# Patient Record
Sex: Male | Born: 1955
Health system: Southern US, Community
[De-identification: ages and names within clinical notes are randomized; demographics above are authoritative.]

## PROBLEM LIST (undated history)

## (undated) DIAGNOSIS — M542 Cervicalgia: Secondary | ICD-10-CM

## (undated) DIAGNOSIS — I10 Essential (primary) hypertension: Secondary | ICD-10-CM

## (undated) DIAGNOSIS — R6882 Decreased libido: Secondary | ICD-10-CM

## (undated) DIAGNOSIS — K859 Acute pancreatitis without necrosis or infection, unspecified: Secondary | ICD-10-CM

## (undated) DIAGNOSIS — F32A Depression, unspecified: Secondary | ICD-10-CM

## (undated) DIAGNOSIS — L719 Rosacea, unspecified: Secondary | ICD-10-CM

## (undated) DIAGNOSIS — E291 Testicular hypofunction: Secondary | ICD-10-CM

## (undated) DIAGNOSIS — D485 Neoplasm of uncertain behavior of skin: Secondary | ICD-10-CM

## (undated) DIAGNOSIS — K30 Functional dyspepsia: Secondary | ICD-10-CM

## (undated) DIAGNOSIS — M436 Torticollis: Secondary | ICD-10-CM

## (undated) DIAGNOSIS — G243 Spasmodic torticollis: Secondary | ICD-10-CM

## (undated) DIAGNOSIS — N5201 Erectile dysfunction due to arterial insufficiency: Secondary | ICD-10-CM

## (undated) DIAGNOSIS — R739 Hyperglycemia, unspecified: Secondary | ICD-10-CM

## (undated) DIAGNOSIS — F329 Major depressive disorder, single episode, unspecified: Secondary | ICD-10-CM

## (undated) DIAGNOSIS — Q680 Congenital deformity of sternocleidomastoid muscle: Secondary | ICD-10-CM

## (undated) HISTORY — DX: Major depressive disorder, single episode, unspecified: F32.9

## (undated) HISTORY — DX: Rosacea, unspecified: L71.9

## (undated) HISTORY — DX: Torticollis: M43.6

## (undated) HISTORY — DX: Neoplasm of uncertain behavior of skin: D48.5

## (undated) HISTORY — DX: Testicular hypofunction: E29.1

## (undated) HISTORY — DX: Congenital deformity of sternocleidomastoid muscle: Q68.0

## (undated) HISTORY — PX: OTHER SURGICAL HISTORY: SHX169

## (undated) HISTORY — DX: Essential (primary) hypertension: I10

## (undated) HISTORY — DX: Spasmodic torticollis: G24.3

## (undated) HISTORY — DX: Acute pancreatitis without necrosis or infection, unspecified: K85.90

## (undated) HISTORY — DX: Erectile dysfunction due to arterial insufficiency: N52.01

## (undated) HISTORY — DX: Depression, unspecified: F32.A

## (undated) HISTORY — DX: Functional dyspepsia: K30

## (undated) HISTORY — DX: Decreased libido: R68.82

## (undated) HISTORY — DX: Hyperglycemia, unspecified: R73.9

## (undated) HISTORY — DX: Cervicalgia: M54.2

---

## 1998-06-01 ENCOUNTER — Encounter: Payer: Self-pay | Admitting: Internal Medicine

## 1998-06-01 ENCOUNTER — Ambulatory Visit (HOSPITAL_COMMUNITY): Admission: RE | Admit: 1998-06-01 | Discharge: 1998-06-01 | Payer: Self-pay | Admitting: Internal Medicine

## 2000-05-01 ENCOUNTER — Encounter: Payer: Self-pay | Admitting: Geriatric Medicine

## 2000-05-01 ENCOUNTER — Ambulatory Visit (HOSPITAL_COMMUNITY): Admission: RE | Admit: 2000-05-01 | Discharge: 2000-05-01 | Payer: Self-pay | Admitting: Geriatric Medicine

## 2000-08-22 ENCOUNTER — Encounter: Admission: RE | Admit: 2000-08-22 | Discharge: 2000-08-22 | Payer: Self-pay | Admitting: Neurosurgery

## 2000-08-22 ENCOUNTER — Encounter: Payer: Self-pay | Admitting: Neurosurgery

## 2002-07-09 ENCOUNTER — Encounter: Admission: RE | Admit: 2002-07-09 | Discharge: 2002-08-20 | Payer: Self-pay | Admitting: Internal Medicine

## 2003-09-06 DIAGNOSIS — K859 Acute pancreatitis without necrosis or infection, unspecified: Secondary | ICD-10-CM

## 2003-09-06 HISTORY — DX: Acute pancreatitis without necrosis or infection, unspecified: K85.90

## 2004-02-21 ENCOUNTER — Inpatient Hospital Stay (HOSPITAL_COMMUNITY): Admission: EM | Admit: 2004-02-21 | Discharge: 2004-02-25 | Payer: Self-pay | Admitting: *Deleted

## 2004-11-15 ENCOUNTER — Encounter: Admission: RE | Admit: 2004-11-15 | Discharge: 2004-11-15 | Payer: Self-pay | Admitting: Internal Medicine

## 2015-04-22 ENCOUNTER — Telehealth: Payer: Self-pay | Admitting: *Deleted

## 2015-04-22 ENCOUNTER — Ambulatory Visit: Payer: BLUE CROSS/BLUE SHIELD | Admitting: Neurology

## 2015-04-22 NOTE — Telephone Encounter (Signed)
No showed now patient appointment.

## 2015-04-23 ENCOUNTER — Encounter: Payer: Self-pay | Admitting: Neurology

## 2015-12-31 DIAGNOSIS — Z1211 Encounter for screening for malignant neoplasm of colon: Secondary | ICD-10-CM | POA: Diagnosis not present

## 2015-12-31 DIAGNOSIS — D126 Benign neoplasm of colon, unspecified: Secondary | ICD-10-CM | POA: Diagnosis not present

## 2015-12-31 DIAGNOSIS — D125 Benign neoplasm of sigmoid colon: Secondary | ICD-10-CM | POA: Diagnosis not present

## 2016-02-17 DIAGNOSIS — R05 Cough: Secondary | ICD-10-CM | POA: Diagnosis not present

## 2016-02-17 DIAGNOSIS — E291 Testicular hypofunction: Secondary | ICD-10-CM | POA: Diagnosis not present

## 2016-02-17 DIAGNOSIS — F329 Major depressive disorder, single episode, unspecified: Secondary | ICD-10-CM | POA: Diagnosis not present

## 2016-02-17 DIAGNOSIS — N5201 Erectile dysfunction due to arterial insufficiency: Secondary | ICD-10-CM | POA: Diagnosis not present

## 2016-12-06 ENCOUNTER — Encounter (INDEPENDENT_AMBULATORY_CARE_PROVIDER_SITE_OTHER): Payer: Self-pay

## 2016-12-06 ENCOUNTER — Encounter: Payer: Self-pay | Admitting: Neurology

## 2016-12-06 ENCOUNTER — Ambulatory Visit (INDEPENDENT_AMBULATORY_CARE_PROVIDER_SITE_OTHER): Payer: BLUE CROSS/BLUE SHIELD | Admitting: Neurology

## 2016-12-06 VITALS — BP 144/87 | HR 93 | Ht 72.0 in | Wt 214.0 lb

## 2016-12-06 DIAGNOSIS — G243 Spasmodic torticollis: Secondary | ICD-10-CM

## 2016-12-06 NOTE — Progress Notes (Signed)
PATIENT: Miguel [name] DOB: Jan 27, 1956  Chief Complaint  Patient presents with  . Cervical Dystonia    He is here to establish care for his Xeomin treatments.  He has had both Botox and Xeomin in the past.  His last injection was 11/19/15 (Xeomin 250 units) and he felt is was beneficial.  He has been getting these treatments from Dr. Evelena Leyden at [facility].  . PCP    Josetta Huddle, MD     HISTORICAL  Miguel [name] is a 61 years old right-handed male, seen in refer by his primary care physician Dr. Josetta Huddle for evaluation of  EMG guided botulism toxin injection for his cervical dystonia, initial evaluation was on December 06 2016.  I reviewed and summarized referring note, he had a history of hypertension, major depression in the past, history of acute pancreatitis, hyperglycemia, he works as a Associate Professor.  Without clear triggers, he noticed gradual onset neck abnormal posturing 20 years ago in 1998, he described difficulty turning towards the left side, neck muscle spasm, grinding sound of his neck and moving about, he was seen by different physicians and eventually was diagnosed with cervical dystonia by Dr. Evelena Leyden at the [facility],   He began to receive EMG guided botulism toxin a Xeomin injection around 2010, I was able to review injection note by Dr. Gilford Rile, he is receiving injection every 4-6 months due to financial concerns, use Xeomin 250 to 300 units each time.  He reported significant improvement to previous injection, most recent injection was in March 2017, he could not continue the injection at the [facility] due to financial concerns.   REVIEW OF SYSTEMS: Full 14 system review of systems performed and notable only for too much sleep  ALLERGIES: Not on File  HOME MEDICATIONS: Current Outpatient Prescriptions  Medication Sig Dispense Refill  . buPROPion (WELLBUTRIN SR) 150 MG 12 hr tablet Take 150 mg by mouth daily.  2  . Cholecalciferol  (VITAMIN D) 2000 units CAPS Take 2,000 Units by mouth daily.    . citalopram (CELEXA) 40 MG tablet Take 40 mg by mouth daily.    . clonazePAM (KLONOPIN) 1 MG tablet Take 1 mg by mouth daily.  0  . esomeprazole (NEXIUM) 40 MG capsule Take 40 mg by mouth as needed.    [name] Kitchen lisinopril (PRINIVIL,ZESTRIL) 20 MG tablet Take 20 mg by mouth daily.  2  . Multiple Vitamin (MULTIVITAMIN) capsule Take 1 capsule by mouth daily.     No current facility-administered medications for this visit.     PAST MEDICAL HISTORY: Past Medical History:  Diagnosis Date  . Cervical dystonia   . Cervicalgia   . Congenital deformity of sternocleidomastoid muscle   . Decreased libido   . Depression   . Erectile dysfunction due to arterial insufficiency   . Functional dyspepsia   . Hyperglycemia   . Hypertension   . Neoplasm of uncertain behavior of skin   . Pancreatitis 2005   likely secondary to alcohol use  . Rosacea   . Testicular hypofunction   . Torticollis     PAST SURGICAL HISTORY: Past Surgical History:  Procedure Laterality Date  . no past surgery      FAMILY HISTORY: Family History  Problem Relation Age of Onset  . Uterine cancer Mother   . Emphysema Father     SOCIAL HISTORY:  Social History   Social History  . Marital status: Married    Spouse name: N/A  .  Number of children: 0  . Years of education: some college   Occupational History  . Associate Professor    Social History Main Topics  . Smoking status: Former Research scientist (life sciences)  . Smokeless tobacco: Never Used  . Alcohol use Yes     Comment: 3 beers per day  . Drug use: No  . Sexual activity: Not on file   Other Topics Concern  . Not on file   Social History Narrative   Lives at home with his wife.   3 cups of caffeine day.   Right-handed.        PHYSICAL EXAM   Vitals:   12/06/16 1101  BP: (!) 144/87  Pulse: 93  Weight: 214 lb (97.1 kg)  Height: 6' (1.829 m)    Not recorded      Body mass index is 29.02  kg/m.  PHYSICAL EXAMNIATION:  Gen: NAD, conversant, well nourised, obese, well groomed                     Cardiovascular: Regular rate rhythm, no peripheral edema, warm, nontender. Eyes: Conjunctivae clear without exudates or hemorrhage Neck: Supple, no carotid bruits. Pulmonary: Clear to auscultation bilaterally   NEUROLOGICAL EXAM:  MENTAL STATUS: Speech:    Speech is normal; fluent and spontaneous with normal comprehension.  Cognition:     Orientation to time, place and person     Normal recent and remote memory     Normal Attention span and concentration     Normal Language, naming, repeating,spontaneous speech     Fund of knowledge   CRANIAL NERVES: He has mild to moderate retrocollis, mild to moderate left tilt, mild right turn, mild right shoulder elevation, able to obtain 2 extreme left and right, but with difficulty especially when turning to the left side  CN II: Visual fields are full to confrontation. Fundoscopic exam is normal with sharp discs and no vascular changes. Pupils are round equal and briskly reactive to light. CN III, IV, VI: extraocular movement are normal. No ptosis. CN V: Facial sensation is intact to pinprick in all 3 divisions bilaterally. Corneal responses are intact.  CN VII: Face is symmetric with normal eye closure and smile. CN VIII: Hearing is normal to rubbing fingers CN IX, X: Palate elevates symmetrically. Phonation is normal. CN XI: Head turning and shoulder shrug are intact CN XII: Tongue is midline with normal movements and no atrophy.  MOTOR: There is no pronator drift of out-stretched arms. Muscle bulk and tone are normal. Muscle strength is normal.  REFLEXES: Reflexes are 2+ and symmetric at the biceps, triceps, knees, and ankles. Plantar responses are flexor.  SENSORY: Intact to light touch, pinprick, positional sensation and vibratory sensation are intact in fingers and toes.  COORDINATION: Rapid alternating movements and fine  finger movements are intact. There is no dysmetria on finger-to-nose and heel-knee-shin.    GAIT/STANCE: Posture is normal. Gait is steady with normal steps, base, arm swing, and turning. Heel and toe walking are normal. Tandem gait is normal.  Romberg is absent.   DIAGNOSTIC DATA (LABS, IMAGING, TESTING) - I reviewed patient records, labs, notes, testing and imaging myself where available.   ASSESSMENT AND PLAN  Miguel [name] is a 61 y.o. male   Cervical dystonia  EMG guided xeomin injection, asked for 300 units xeomin under EMG guidance  Return to clinic in one month for injection   Marcial Pacas, M.D. Ph.D.  [facility] [address] [place],  [place] [address] Ph: [phone] Fax: [phone]  CC: Josetta Huddle, MD

## 2016-12-14 ENCOUNTER — Telehealth: Payer: Self-pay | Admitting: Neurology

## 2016-12-14 NOTE — Telephone Encounter (Signed)
Patient is calling to see if PA has been gotten for him to have Xeomin injections.

## 2016-12-14 NOTE — Telephone Encounter (Signed)
Pt has called back again requesting an update re: if he has been approved for the injections.  He would like a call back either way

## 2016-12-16 NOTE — Telephone Encounter (Signed)
I returned the patients call, he did not answer so I left a VM asking him to call me back.

## 2016-12-20 NOTE — Telephone Encounter (Signed)
Spoke with the patient and scheduled injection. He states that he is in a lot of pain and would like to know if he can come in sooner. Botox will be complete by the end of the week.

## 2016-12-20 NOTE — Telephone Encounter (Signed)
Patient returning your call.

## 2016-12-20 NOTE — Telephone Encounter (Signed)
Left patient message - offered him an appt on 12/28/16 at 7:30am - arrival time 7:15am.  I ask him to return my call tomorrow to confirm the appt.

## 2016-12-21 NOTE — Telephone Encounter (Signed)
Pt called to accept the earlier appointment for 12-28-2016 at 7:30

## 2016-12-28 ENCOUNTER — Ambulatory Visit: Payer: Self-pay | Admitting: Neurology

## 2017-01-03 DIAGNOSIS — G243 Spasmodic torticollis: Secondary | ICD-10-CM | POA: Diagnosis not present

## 2017-01-03 NOTE — Telephone Encounter (Signed)
Pt is asking that you contact the pharmacy to have the Xeomin injections sent.

## 2017-01-04 ENCOUNTER — Ambulatory Visit: Payer: BLUE CROSS/BLUE SHIELD | Admitting: Neurology

## 2017-01-04 ENCOUNTER — Ambulatory Visit (INDEPENDENT_AMBULATORY_CARE_PROVIDER_SITE_OTHER): Payer: BLUE CROSS/BLUE SHIELD | Admitting: Neurology

## 2017-01-04 ENCOUNTER — Encounter: Payer: Self-pay | Admitting: Neurology

## 2017-01-04 VITALS — BP 165/100 | HR 68 | Ht 72.0 in | Wt 219.0 lb

## 2017-01-04 DIAGNOSIS — G243 Spasmodic torticollis: Secondary | ICD-10-CM | POA: Diagnosis not present

## 2017-01-04 NOTE — Progress Notes (Signed)
**  Xeomin 100 units x 3 units, NDC 0259-1610-01, Lot [id number], Exp 11/2017, specialty pharmacy.//mck,rn**

## 2017-01-04 NOTE — Progress Notes (Signed)
PATIENT: Miguel [name] DOB: 1956/01/24  Chief Complaint  Patient presents with  . Cervical Dystonia    Xeomin 300 units - specialty pharmacy     HISTORICAL  Miguel [name] is a 61 years old right-handed male, seen in refer by his primary care physician Dr. Josetta Huddle for evaluation of  EMG guided botulism toxin injection for his cervical dystonia, initial evaluation was on December 06 2016.  I reviewed and summarized referring note, he had a history of hypertension, major depression in the past, history of acute pancreatitis, hyperglycemia, he works as a Associate Professor.  Without clear triggers, he noticed gradual onset neck abnormal posturing 20 years ago in 1998, he described difficulty turning towards the left side, neck muscle spasm, grinding sound of his neck and moving about, he was seen by different physicians and eventually was diagnosed with cervical dystonia by Dr. Evelena Leyden at the [facility],   He began to receive EMG guided botulism toxin a Xeomin injection around 2010, I was able to review injection note by Dr. Gilford Rile, he is receiving injection every 4-6 months due to financial concerns, use Xeomin 250 to 300 units each time.  He reported significant improvement to previous injection, most recent injection was in March 2017, he could not continue the injection at the [facility] due to financial concerns.   UPDATE Jan 04 2017: This is his first EMG guided Xeomin injection for cervical dystonia through our office, he understands the potential benefit and site effect, used Xeomin 300 units at today's visit  REVIEW OF SYSTEMS: Full 14 system review of systems performed and notable only for too much sleep  ALLERGIES: Not on File  HOME MEDICATIONS: Current Outpatient Prescriptions  Medication Sig Dispense Refill  . buPROPion (WELLBUTRIN SR) 150 MG 12 hr tablet Take 150 mg by mouth daily.  2  . Cholecalciferol (VITAMIN D) 2000 units CAPS Take 2,000 Units by  mouth daily.    . citalopram (CELEXA) 40 MG tablet Take 40 mg by mouth daily.    . clonazePAM (KLONOPIN) 1 MG tablet Take 1 mg by mouth daily.  0  . esomeprazole (NEXIUM) 40 MG capsule Take 40 mg by mouth as needed.    . IncobotulinumtoxinA (XEOMIN IM) Inject 300 Units into the muscle every 3 (three) months.    Marland Kitchen lisinopril (PRINIVIL,ZESTRIL) 20 MG tablet Take 20 mg by mouth daily.  2  . Multiple Vitamin (MULTIVITAMIN) capsule Take 1 capsule by mouth daily.     No current facility-administered medications for this visit.     PAST MEDICAL HISTORY: Past Medical History:  Diagnosis Date  . Cervical dystonia   . Cervicalgia   . Congenital deformity of sternocleidomastoid muscle   . Decreased libido   . Depression   . Erectile dysfunction due to arterial insufficiency   . Functional dyspepsia   . Hyperglycemia   . Hypertension   . Neoplasm of uncertain behavior of skin   . Pancreatitis 2005   likely secondary to alcohol use  . Rosacea   . Testicular hypofunction   . Torticollis     PAST SURGICAL HISTORY: Past Surgical History:  Procedure Laterality Date  . no past surgery      FAMILY HISTORY: Family History  Problem Relation Age of Onset  . Uterine cancer Mother   . Emphysema Father     SOCIAL HISTORY:  Social History   Social History  . Marital status: Married    Spouse name: N/A  . Number  of children: 0  . Years of education: some college   Occupational History  . Associate Professor    Social History Main Topics  . Smoking status: Former Research scientist (life sciences)  . Smokeless tobacco: Never Used  . Alcohol use Yes     Comment: 3 beers per day  . Drug use: No  . Sexual activity: Not on file   Other Topics Concern  . Not on file   Social History Narrative   Lives at home with his wife.   3 cups of caffeine day.   Right-handed.        PHYSICAL EXAM   Vitals:   01/04/17 1431  BP: (!) 165/100  Pulse: 68  Weight: 219 lb (99.3 kg)  Height: 6' (1.829 m)    Not  recorded      Body mass index is 29.7 kg/m.  PHYSICAL EXAMNIATION:  Gen: NAD, conversant, well nourised, obese, well groomed                     Cardiovascular: Regular rate rhythm, no peripheral edema, warm, nontender. Eyes: Conjunctivae clear without exudates or hemorrhage Neck: Supple, no carotid bruits. Pulmonary: Clear to auscultation bilaterally   NEUROLOGICAL EXAM:  MENTAL STATUS: Speech:    Speech is normal; fluent and spontaneous with normal comprehension.  Cognition:     Orientation to time, place and person     Normal recent and remote memory     Normal Attention span and concentration     Normal Language, naming, repeating,spontaneous speech     Fund of knowledge   CRANIAL NERVES: He has mild to moderate retrocollis, mild to moderate left tilt, mild right turn, mild left shoulder elevation, able to obtain extreme left and right, but with difficulty especially when turning to the left side  CN II: Visual fields are full to confrontation. Fundoscopic exam is normal with sharp discs and no vascular changes. Pupils are round equal and briskly reactive to light. CN III, IV, VI: extraocular movement are normal. No ptosis. CN V: Facial sensation is intact to pinprick in all 3 divisions bilaterally. Corneal responses are intact.  CN VII: Face is symmetric with normal eye closure and smile. CN VIII: Hearing is normal to rubbing fingers CN IX, X: Palate elevates symmetrically. Phonation is normal. CN XI: Head turning and shoulder shrug are intact CN XII: Tongue is midline with normal movements and no atrophy.  MOTOR: There is no pronator drift of out-stretched arms. Muscle bulk and tone are normal. Muscle strength is normal.  REFLEXES: Reflexes are 2+ and symmetric at the biceps, triceps, knees, and ankles. Plantar responses are flexor.  SENSORY: Intact to light touch, pinprick, positional sensation and vibratory sensation are intact in fingers and  toes.  COORDINATION: Rapid alternating movements and fine finger movements are intact. There is no dysmetria on finger-to-nose and heel-knee-shin.    GAIT/STANCE: Posture is normal. Gait is steady with normal steps, base, arm swing, and turning. Heel and toe walking are normal. Tandem gait is normal.  Romberg is absent.   DIAGNOSTIC DATA (LABS, IMAGING, TESTING) - I reviewed patient records, labs, notes, testing and imaging myself where available.   ASSESSMENT AND PLAN  Chidiebere Wynn is a 61 y.o. male   Cervical dystonia  EMG guided xeomin injection, used to 300 units of xeomin  Left levator scapular 25 units Right levator scapular 25 units  Left longissimus capitis 25 units Left splenius capitis 25 units  Left splenius cervix 25 units  Right sternocleidomastoid 25 units times 2= 50 units  Left inferior oblique capitis 25 units Right inferior oblique capitis 25 units  Left semispinalis 25 units Right splenius capitis 25 units  Patient tolerate the injection well and will return to clinic in 3 months for repeat injection.  Marcial Pacas, M.D. Ph.D.  [facility] [place], [place] [address] Phone: [phone] Fax:      [phone] Right semispinalis 25 units  Marcial Pacas, M.D. Ph.D.  [facility] [address] [place], [place] [address] Ph: [phone] Fax: [phone]  CC: Josetta Huddle, MD

## 2017-02-24 DIAGNOSIS — M436 Torticollis: Secondary | ICD-10-CM | POA: Diagnosis not present

## 2017-02-24 DIAGNOSIS — I10 Essential (primary) hypertension: Secondary | ICD-10-CM | POA: Diagnosis not present

## 2017-02-24 DIAGNOSIS — R739 Hyperglycemia, unspecified: Secondary | ICD-10-CM | POA: Diagnosis not present

## 2017-02-24 DIAGNOSIS — N5201 Erectile dysfunction due to arterial insufficiency: Secondary | ICD-10-CM | POA: Diagnosis not present

## 2017-02-24 DIAGNOSIS — Z125 Encounter for screening for malignant neoplasm of prostate: Secondary | ICD-10-CM | POA: Diagnosis not present

## 2017-02-24 DIAGNOSIS — Z0000 Encounter for general adult medical examination without abnormal findings: Secondary | ICD-10-CM | POA: Diagnosis not present

## 2017-02-24 DIAGNOSIS — E291 Testicular hypofunction: Secondary | ICD-10-CM | POA: Diagnosis not present

## 2017-04-24 ENCOUNTER — Telehealth: Payer: Self-pay | Admitting: Neurology

## 2017-04-24 NOTE — Telephone Encounter (Signed)
Pt calling re: Botox appointment, he no longer wants to wait until Nov. He would like to have one as soon as possible.  He is asking to be called to be rescheduled please

## 2017-04-25 NOTE — Telephone Encounter (Signed)
I called the patient to schedule injection but he did not answer. Left a VM asking him to call us back.

## 2017-04-26 DIAGNOSIS — H524 Presbyopia: Secondary | ICD-10-CM | POA: Diagnosis not present

## 2017-06-01 DIAGNOSIS — G243 Spasmodic torticollis: Secondary | ICD-10-CM | POA: Diagnosis not present

## 2017-07-03 DIAGNOSIS — Z23 Encounter for immunization: Secondary | ICD-10-CM | POA: Diagnosis not present

## 2017-07-19 ENCOUNTER — Ambulatory Visit: Payer: BLUE CROSS/BLUE SHIELD | Admitting: Neurology

## 2018-03-28 DIAGNOSIS — R739 Hyperglycemia, unspecified: Secondary | ICD-10-CM | POA: Diagnosis not present

## 2018-03-28 DIAGNOSIS — Z79899 Other long term (current) drug therapy: Secondary | ICD-10-CM | POA: Diagnosis not present

## 2018-03-28 DIAGNOSIS — E785 Hyperlipidemia, unspecified: Secondary | ICD-10-CM | POA: Diagnosis not present

## 2018-03-28 DIAGNOSIS — Z0000 Encounter for general adult medical examination without abnormal findings: Secondary | ICD-10-CM | POA: Diagnosis not present

## 2018-03-28 DIAGNOSIS — R351 Nocturia: Secondary | ICD-10-CM | POA: Diagnosis not present

## 2018-03-28 DIAGNOSIS — E291 Testicular hypofunction: Secondary | ICD-10-CM | POA: Diagnosis not present

## 2018-03-28 DIAGNOSIS — N5201 Erectile dysfunction due to arterial insufficiency: Secondary | ICD-10-CM | POA: Diagnosis not present

## 2018-03-28 DIAGNOSIS — I10 Essential (primary) hypertension: Secondary | ICD-10-CM | POA: Diagnosis not present

## 2018-04-03 DIAGNOSIS — G243 Spasmodic torticollis: Secondary | ICD-10-CM | POA: Diagnosis not present

## 2018-04-12 DIAGNOSIS — G243 Spasmodic torticollis: Secondary | ICD-10-CM | POA: Diagnosis not present

## 2018-06-25 DIAGNOSIS — Z23 Encounter for immunization: Secondary | ICD-10-CM | POA: Diagnosis not present

## 2018-09-24 DIAGNOSIS — J069 Acute upper respiratory infection, unspecified: Secondary | ICD-10-CM | POA: Diagnosis not present

## 2018-09-24 DIAGNOSIS — J209 Acute bronchitis, unspecified: Secondary | ICD-10-CM | POA: Diagnosis not present

## 2018-12-10 MED FILL — LISINOPRIL 20 MG TABLET: 20 | 90 days supply | Qty: 90 | Fill #0

## 2018-12-10 MED FILL — BUPROPION HCL SR 150 MG TAB: 150 | 90 days supply | Qty: 90 | Fill #0

## 2018-12-25 DIAGNOSIS — G243 Spasmodic torticollis: Secondary | ICD-10-CM | POA: Diagnosis not present

## 2018-12-28 MED FILL — clonazePAM 1 MG TABS: 1 | 90 days supply | Qty: 90 | Fill #0

## 2019-04-17 DIAGNOSIS — I10 Essential (primary) hypertension: Secondary | ICD-10-CM | POA: Diagnosis not present

## 2019-04-17 DIAGNOSIS — Z79899 Other long term (current) drug therapy: Secondary | ICD-10-CM | POA: Diagnosis not present

## 2019-04-17 DIAGNOSIS — E291 Testicular hypofunction: Secondary | ICD-10-CM | POA: Diagnosis not present

## 2019-04-17 DIAGNOSIS — Z0000 Encounter for general adult medical examination without abnormal findings: Secondary | ICD-10-CM | POA: Diagnosis not present

## 2019-04-17 DIAGNOSIS — E559 Vitamin D deficiency, unspecified: Secondary | ICD-10-CM | POA: Diagnosis not present

## 2019-04-17 DIAGNOSIS — R739 Hyperglycemia, unspecified: Secondary | ICD-10-CM | POA: Diagnosis not present

## 2019-04-17 DIAGNOSIS — Z125 Encounter for screening for malignant neoplasm of prostate: Secondary | ICD-10-CM | POA: Diagnosis not present

## 2019-04-17 DIAGNOSIS — E785 Hyperlipidemia, unspecified: Secondary | ICD-10-CM | POA: Diagnosis not present

## 2019-04-17 DIAGNOSIS — R351 Nocturia: Secondary | ICD-10-CM | POA: Diagnosis not present

## 2019-04-17 DIAGNOSIS — M436 Torticollis: Secondary | ICD-10-CM | POA: Diagnosis not present

## 2019-06-18 DIAGNOSIS — G243 Spasmodic torticollis: Secondary | ICD-10-CM | POA: Diagnosis not present

## 2019-11-19 DIAGNOSIS — G243 Spasmodic torticollis: Secondary | ICD-10-CM | POA: Diagnosis not present

## 2020-01-20 DIAGNOSIS — F324 Major depressive disorder, single episode, in partial remission: Secondary | ICD-10-CM | POA: Diagnosis not present

## 2020-01-20 DIAGNOSIS — M436 Torticollis: Secondary | ICD-10-CM | POA: Diagnosis not present

## 2020-01-20 DIAGNOSIS — I951 Orthostatic hypotension: Secondary | ICD-10-CM | POA: Diagnosis not present

## 2020-01-20 DIAGNOSIS — I10 Essential (primary) hypertension: Secondary | ICD-10-CM | POA: Diagnosis not present

## 2020-03-02 DIAGNOSIS — I10 Essential (primary) hypertension: Secondary | ICD-10-CM | POA: Diagnosis not present

## 2020-03-02 DIAGNOSIS — F324 Major depressive disorder, single episode, in partial remission: Secondary | ICD-10-CM | POA: Diagnosis not present

## 2020-03-02 DIAGNOSIS — I951 Orthostatic hypotension: Secondary | ICD-10-CM | POA: Diagnosis not present

## 2020-03-02 DIAGNOSIS — M436 Torticollis: Secondary | ICD-10-CM | POA: Diagnosis not present

## 2020-04-23 DIAGNOSIS — Z0000 Encounter for general adult medical examination without abnormal findings: Secondary | ICD-10-CM | POA: Diagnosis not present

## 2020-04-23 DIAGNOSIS — M436 Torticollis: Secondary | ICD-10-CM | POA: Diagnosis not present

## 2020-04-23 DIAGNOSIS — R7303 Prediabetes: Secondary | ICD-10-CM | POA: Diagnosis not present

## 2020-04-23 DIAGNOSIS — Z125 Encounter for screening for malignant neoplasm of prostate: Secondary | ICD-10-CM | POA: Diagnosis not present

## 2020-04-23 DIAGNOSIS — Z79899 Other long term (current) drug therapy: Secondary | ICD-10-CM | POA: Diagnosis not present

## 2020-04-23 DIAGNOSIS — Z13220 Encounter for screening for lipoid disorders: Secondary | ICD-10-CM | POA: Diagnosis not present

## 2020-04-23 DIAGNOSIS — I10 Essential (primary) hypertension: Secondary | ICD-10-CM | POA: Diagnosis not present

## 2020-04-23 DIAGNOSIS — F324 Major depressive disorder, single episode, in partial remission: Secondary | ICD-10-CM | POA: Diagnosis not present

## 2020-07-13 ENCOUNTER — Other Ambulatory Visit (HOSPITAL_COMMUNITY): Payer: Self-pay | Admitting: Internal Medicine

## 2020-08-10 ENCOUNTER — Other Ambulatory Visit (HOSPITAL_COMMUNITY): Payer: Self-pay | Admitting: Internal Medicine

## 2020-09-22 ENCOUNTER — Other Ambulatory Visit (HOSPITAL_COMMUNITY): Payer: Self-pay | Admitting: Internal Medicine

## 2020-10-06 DIAGNOSIS — M170 Bilateral primary osteoarthritis of knee: Secondary | ICD-10-CM | POA: Diagnosis not present

## 2020-11-19 DIAGNOSIS — M25562 Pain in left knee: Secondary | ICD-10-CM | POA: Diagnosis not present

## 2020-11-19 DIAGNOSIS — M25561 Pain in right knee: Secondary | ICD-10-CM | POA: Diagnosis not present

## 2020-12-07 ENCOUNTER — Other Ambulatory Visit (HOSPITAL_COMMUNITY): Payer: Self-pay

## 2020-12-18 ENCOUNTER — Other Ambulatory Visit: Payer: Self-pay

## 2020-12-18 ENCOUNTER — Emergency Department (HOSPITAL_COMMUNITY): Payer: BC Managed Care – PPO

## 2020-12-18 ENCOUNTER — Inpatient Hospital Stay (HOSPITAL_COMMUNITY): Payer: BC Managed Care – PPO

## 2020-12-18 ENCOUNTER — Inpatient Hospital Stay (HOSPITAL_COMMUNITY)
Admission: EM | Admit: 2020-12-18 | Discharge: 2020-12-31 | DRG: 065 | Disposition: A | Discharge status: Rehabilitation Facility | Payer: BC Managed Care – PPO | Attending: Internal Medicine | Admitting: Internal Medicine

## 2020-12-18 ENCOUNTER — Encounter (HOSPITAL_COMMUNITY): Payer: Self-pay

## 2020-12-18 DIAGNOSIS — I639 Cerebral infarction, unspecified: Secondary | ICD-10-CM

## 2020-12-18 DIAGNOSIS — E119 Type 2 diabetes mellitus without complications: Secondary | ICD-10-CM | POA: Diagnosis not present

## 2020-12-18 DIAGNOSIS — Z20822 Contact with and (suspected) exposure to covid-19: Secondary | ICD-10-CM | POA: Diagnosis not present

## 2020-12-18 DIAGNOSIS — I951 Orthostatic hypotension: Secondary | ICD-10-CM | POA: Diagnosis not present

## 2020-12-18 DIAGNOSIS — Z87891 Personal history of nicotine dependence: Secondary | ICD-10-CM | POA: Diagnosis not present

## 2020-12-18 DIAGNOSIS — N401 Enlarged prostate with lower urinary tract symptoms: Secondary | ICD-10-CM | POA: Diagnosis not present

## 2020-12-18 DIAGNOSIS — E785 Hyperlipidemia, unspecified: Secondary | ICD-10-CM | POA: Diagnosis not present

## 2020-12-18 DIAGNOSIS — Z87892 Personal history of anaphylaxis: Secondary | ICD-10-CM | POA: Diagnosis not present

## 2020-12-18 DIAGNOSIS — R270 Ataxia, unspecified: Secondary | ICD-10-CM | POA: Diagnosis present

## 2020-12-18 DIAGNOSIS — I63212 Cerebral infarction due to unspecified occlusion or stenosis of left vertebral arteries: Principal | ICD-10-CM | POA: Diagnosis present

## 2020-12-18 DIAGNOSIS — R338 Other retention of urine: Secondary | ICD-10-CM | POA: Diagnosis not present

## 2020-12-18 DIAGNOSIS — R29700 NIHSS score 0: Secondary | ICD-10-CM | POA: Diagnosis not present

## 2020-12-18 DIAGNOSIS — E871 Hypo-osmolality and hyponatremia: Secondary | ICD-10-CM | POA: Diagnosis present

## 2020-12-18 DIAGNOSIS — L309 Dermatitis, unspecified: Secondary | ICD-10-CM | POA: Diagnosis not present

## 2020-12-18 DIAGNOSIS — Z7289 Other problems related to lifestyle: Secondary | ICD-10-CM

## 2020-12-18 DIAGNOSIS — I6389 Other cerebral infarction: Secondary | ICD-10-CM

## 2020-12-18 DIAGNOSIS — W1830XA Fall on same level, unspecified, initial encounter: Secondary | ICD-10-CM | POA: Diagnosis present

## 2020-12-18 DIAGNOSIS — G249 Dystonia, unspecified: Secondary | ICD-10-CM | POA: Diagnosis not present

## 2020-12-18 DIAGNOSIS — I63442 Cerebral infarction due to embolism of left cerebellar artery: Secondary | ICD-10-CM | POA: Diagnosis not present

## 2020-12-18 DIAGNOSIS — I69393 Ataxia following cerebral infarction: Secondary | ICD-10-CM | POA: Diagnosis not present

## 2020-12-18 DIAGNOSIS — S0990XA Unspecified injury of head, initial encounter: Secondary | ICD-10-CM | POA: Diagnosis not present

## 2020-12-18 DIAGNOSIS — I6523 Occlusion and stenosis of bilateral carotid arteries: Secondary | ICD-10-CM | POA: Diagnosis not present

## 2020-12-18 DIAGNOSIS — G4700 Insomnia, unspecified: Secondary | ICD-10-CM | POA: Diagnosis not present

## 2020-12-18 DIAGNOSIS — G243 Spasmodic torticollis: Secondary | ICD-10-CM | POA: Diagnosis not present

## 2020-12-18 DIAGNOSIS — K5900 Constipation, unspecified: Secondary | ICD-10-CM | POA: Diagnosis not present

## 2020-12-18 DIAGNOSIS — I6503 Occlusion and stenosis of bilateral vertebral arteries: Secondary | ICD-10-CM | POA: Diagnosis not present

## 2020-12-18 DIAGNOSIS — F1010 Alcohol abuse, uncomplicated: Secondary | ICD-10-CM | POA: Diagnosis present

## 2020-12-18 DIAGNOSIS — I10 Essential (primary) hypertension: Secondary | ICD-10-CM | POA: Diagnosis present

## 2020-12-18 DIAGNOSIS — F32A Depression, unspecified: Secondary | ICD-10-CM | POA: Diagnosis present

## 2020-12-18 DIAGNOSIS — Z789 Other specified health status: Secondary | ICD-10-CM

## 2020-12-18 DIAGNOSIS — Z79899 Other long term (current) drug therapy: Secondary | ICD-10-CM

## 2020-12-18 DIAGNOSIS — E876 Hypokalemia: Secondary | ICD-10-CM | POA: Diagnosis not present

## 2020-12-18 DIAGNOSIS — R0781 Pleurodynia: Secondary | ICD-10-CM | POA: Diagnosis not present

## 2020-12-18 DIAGNOSIS — F519 Sleep disorder not due to a substance or known physiological condition, unspecified: Secondary | ICD-10-CM | POA: Diagnosis not present

## 2020-12-18 DIAGNOSIS — Z560 Unemployment, unspecified: Secondary | ICD-10-CM | POA: Diagnosis not present

## 2020-12-18 DIAGNOSIS — Z886 Allergy status to analgesic agent status: Secondary | ICD-10-CM

## 2020-12-18 DIAGNOSIS — F419 Anxiety disorder, unspecified: Secondary | ICD-10-CM | POA: Diagnosis present

## 2020-12-18 DIAGNOSIS — F418 Other specified anxiety disorders: Secondary | ICD-10-CM | POA: Diagnosis not present

## 2020-12-18 DIAGNOSIS — R066 Hiccough: Secondary | ICD-10-CM | POA: Diagnosis not present

## 2020-12-18 DIAGNOSIS — J329 Chronic sinusitis, unspecified: Secondary | ICD-10-CM | POA: Diagnosis not present

## 2020-12-18 LAB — RAPID URINE DRUG SCREEN, HOSP PERFORMED
Amphetamines: NOT DETECTED
Barbiturates: NOT DETECTED
Benzodiazepines: NOT DETECTED
Cocaine: NOT DETECTED
Opiates: NOT DETECTED
Tetrahydrocannabinol: POSITIVE — AB

## 2020-12-18 LAB — CBC WITH DIFFERENTIAL/PLATELET
Abs Immature Granulocytes: 0.01 10*3/uL (ref 0.00–0.07)
Basophils Absolute: 0 10*3/uL (ref 0.0–0.1)
Basophils Relative: 0 %
Eosinophils Absolute: 0 10*3/uL (ref 0.0–0.5)
Eosinophils Relative: 0 %
HCT: 40.6 % (ref 39.0–52.0)
Hemoglobin: 14.2 g/dL (ref 13.0–17.0)
Immature Granulocytes: 0 %
Lymphocytes Relative: 19 %
Lymphs Abs: 0.9 10*3/uL (ref 0.7–4.0)
MCH: 34.8 pg — ABNORMAL HIGH (ref 26.0–34.0)
MCHC: 35 g/dL (ref 30.0–36.0)
MCV: 99.5 fL (ref 80.0–100.0)
Monocytes Absolute: 0.4 10*3/uL (ref 0.1–1.0)
Monocytes Relative: 10 %
Neutro Abs: 3.1 10*3/uL (ref 1.7–7.7)
Neutrophils Relative %: 71 %
Platelets: 210 10*3/uL (ref 150–400)
RBC: 4.08 MIL/uL — ABNORMAL LOW (ref 4.22–5.81)
RDW: 12 % (ref 11.5–15.5)
WBC: 4.5 10*3/uL (ref 4.0–10.5)
nRBC: 0 % (ref 0.0–0.2)

## 2020-12-18 LAB — ECHOCARDIOGRAM COMPLETE
AR max vel: 1.75 cm2
AV Area VTI: 1.61 cm2
AV Area mean vel: 1.63 cm2
AV Mean grad: 5 mmHg
AV Peak grad: 7.7 mmHg
Ao pk vel: 1.39 m/s
Area-P 1/2: 5.27 cm2
Calc EF: 46.2 %
Height: 72 in
MV M vel: 5.46 m/s
MV Peak grad: 119.2 mmHg
P 1/2 time: 324 msec
S' Lateral: 3.8 cm
Single Plane A2C EF: 48 %
Single Plane A4C EF: 45.7 %
Weight: 3152 oz

## 2020-12-18 LAB — CBC
HCT: 39.2 % (ref 39.0–52.0)
Hemoglobin: 13.6 g/dL (ref 13.0–17.0)
MCH: 34.3 pg — ABNORMAL HIGH (ref 26.0–34.0)
MCHC: 34.7 g/dL (ref 30.0–36.0)
MCV: 98.7 fL (ref 80.0–100.0)
Platelets: 198 10*3/uL (ref 150–400)
RBC: 3.97 MIL/uL — ABNORMAL LOW (ref 4.22–5.81)
RDW: 12 % (ref 11.5–15.5)
WBC: 4.5 10*3/uL (ref 4.0–10.5)
nRBC: 0 % (ref 0.0–0.2)

## 2020-12-18 LAB — URINALYSIS, ROUTINE W REFLEX MICROSCOPIC
Bilirubin Urine: NEGATIVE
Glucose, UA: NEGATIVE mg/dL
Hgb urine dipstick: NEGATIVE
Ketones, ur: 80 mg/dL — AB
Leukocytes,Ua: NEGATIVE
Nitrite: NEGATIVE
Protein, ur: NEGATIVE mg/dL
Specific Gravity, Urine: 1.017 (ref 1.005–1.030)
pH: 6 (ref 5.0–8.0)

## 2020-12-18 LAB — COMPREHENSIVE METABOLIC PANEL
ALT: 25 U/L (ref 0–44)
AST: 22 U/L (ref 15–41)
Albumin: 4.2 g/dL (ref 3.5–5.0)
Alkaline Phosphatase: 59 U/L (ref 38–126)
Anion gap: 10 (ref 5–15)
BUN: 9 mg/dL (ref 8–23)
CO2: 25 mmol/L (ref 22–32)
Calcium: 9.5 mg/dL (ref 8.9–10.3)
Chloride: 99 mmol/L (ref 98–111)
Creatinine, Ser: 0.69 mg/dL (ref 0.61–1.24)
GFR, Estimated: >60 mL/min (ref >60)
Glucose, Bld: 165 mg/dL — ABNORMAL HIGH (ref 70–99)
Potassium: 4 mmol/L (ref 3.5–5.1)
Sodium: 134 mmol/L — ABNORMAL LOW (ref 135–145)
Total Bilirubin: 0.7 mg/dL (ref 0.3–1.2)
Total Protein: 6.9 g/dL (ref 6.5–8.1)

## 2020-12-18 LAB — CREATININE, SERUM
Creatinine, Ser: 0.71 mg/dL (ref 0.61–1.24)
GFR, Estimated: >60 mL/min (ref >60)

## 2020-12-18 LAB — HIV ANTIBODY (ROUTINE TESTING W REFLEX): HIV Screen 4th Generation wRfx: NONREACTIVE

## 2020-12-18 LAB — PHOSPHORUS: Phosphorus: 2.6 mg/dL (ref 2.5–4.6)

## 2020-12-18 LAB — MAGNESIUM: Magnesium: 1.8 mg/dL (ref 1.7–2.4)

## 2020-12-18 LAB — SARS CORONAVIRUS 2 (TAT 6-24 HRS): SARS Coronavirus 2: NEGATIVE

## 2020-12-18 MED ORDER — ENOXAPARIN SODIUM 40 MG/0.4ML ~~LOC~~ SOLN
40.0000 mg | Freq: Every day | SUBCUTANEOUS | Status: DC
  Administered 2020-12-18 – 2020-12-31 (×14): 40 mg via SUBCUTANEOUS
  Filled 2020-12-18 (×15): qty 0.4

## 2020-12-18 MED ORDER — IRBESARTAN 150 MG PO TABS
75.0000 mg | ORAL_TABLET | Freq: Every day | ORAL | Status: DC
  Administered 2020-12-18 – 2020-12-29 (×12): 75 mg via ORAL
  Filled 2020-12-18 (×12): qty 1

## 2020-12-18 MED ORDER — BUPROPION HCL ER (SR) 150 MG PO TB12
150.0000 mg | ORAL_TABLET | Freq: Every day | ORAL | Status: DC
  Administered 2020-12-18 – 2020-12-31 (×13): 150 mg via ORAL
  Filled 2020-12-18 (×14): qty 1

## 2020-12-18 MED ORDER — CLOPIDOGREL BISULFATE 300 MG PO TABS
300.0000 mg | ORAL_TABLET | Freq: Once | ORAL | Status: AC
  Administered 2020-12-18: 300 mg via ORAL
  Filled 2020-12-18: qty 1

## 2020-12-18 MED ORDER — LORAZEPAM 2 MG/ML IJ SOLN
1.0000 mg | INTRAMUSCULAR | Status: DC | PRN
  Administered 2020-12-18: 1 mg via INTRAVENOUS
  Filled 2020-12-18: qty 1

## 2020-12-18 MED ORDER — ACETAMINOPHEN 325 MG PO TABS
650.0000 mg | ORAL_TABLET | Freq: Four times a day (QID) | ORAL | Status: DC | PRN
  Administered 2020-12-19 – 2020-12-27 (×11): 650 mg via ORAL
  Filled 2020-12-18 (×11): qty 2

## 2020-12-18 MED ORDER — ACETAMINOPHEN 650 MG RE SUPP: 650.0000 mg | Freq: Four times a day (QID) | RECTAL | Status: DC | PRN

## 2020-12-18 MED ORDER — LORAZEPAM 1 MG PO TABS: 1.0000 mg | ORAL_TABLET | ORAL | Status: DC | PRN

## 2020-12-18 MED ORDER — CLONAZEPAM 0.5 MG PO TABS
1.0000 mg | ORAL_TABLET | Freq: Every day | ORAL | Status: DC
  Administered 2020-12-18 – 2020-12-31 (×14): 1 mg via ORAL
  Filled 2020-12-18 (×14): qty 2

## 2020-12-18 MED ORDER — VENLAFAXINE HCL ER 75 MG PO CP24
75.0000 mg | ORAL_CAPSULE | Freq: Every day | ORAL | Status: DC
  Administered 2020-12-18 – 2020-12-31 (×13): 75 mg via ORAL
  Filled 2020-12-18 (×14): qty 1

## 2020-12-18 MED ORDER — ONDANSETRON HCL 4 MG PO TABS
4.0000 mg | ORAL_TABLET | Freq: Four times a day (QID) | ORAL | Status: DC | PRN
  Administered 2020-12-21: 4 mg via ORAL
  Filled 2020-12-18: qty 1

## 2020-12-18 MED ORDER — CLOPIDOGREL BISULFATE 75 MG PO TABS
75.0000 mg | ORAL_TABLET | Freq: Every day | ORAL | Status: DC
  Administered 2020-12-19 – 2020-12-31 (×13): 75 mg via ORAL
  Filled 2020-12-18 (×13): qty 1

## 2020-12-18 MED ORDER — ADULT MULTIVITAMIN W/MINERALS CH
1.0000 | ORAL_TABLET | Freq: Every day | ORAL | Status: DC
  Administered 2020-12-18 – 2020-12-26 (×8): 1 via ORAL
  Filled 2020-12-18 (×9): qty 1

## 2020-12-18 MED ORDER — ONDANSETRON 4 MG PO TBDP
4.0000 mg | ORAL_TABLET | Freq: Once | ORAL | Status: AC
Start: 2020-12-18 — End: 2020-12-18
  Administered 2020-12-18: 4 mg via ORAL
  Filled 2020-12-18: qty 1

## 2020-12-18 MED ORDER — STROKE: EARLY STAGES OF RECOVERY BOOK
Freq: Once | Status: AC
  Filled 2020-12-18: qty 1

## 2020-12-18 MED ORDER — ONDANSETRON HCL 4 MG/2ML IJ SOLN
4.0000 mg | Freq: Four times a day (QID) | INTRAMUSCULAR | Status: DC | PRN
  Administered 2020-12-20: 4 mg via INTRAVENOUS
  Filled 2020-12-18: qty 2

## 2020-12-18 MED ORDER — IOHEXOL 350 MG/ML SOLN
75.0000 mL | Freq: Once | INTRAVENOUS | Status: AC | PRN
  Administered 2020-12-18: 75 mL via INTRAVENOUS

## 2020-12-18 MED ORDER — ATORVASTATIN CALCIUM 80 MG PO TABS
80.0000 mg | ORAL_TABLET | Freq: Every day | ORAL | Status: DC
  Administered 2020-12-18 – 2020-12-31 (×13): 80 mg via ORAL
  Filled 2020-12-18 (×11): qty 1
  Filled 2020-12-18: qty 2
  Filled 2020-12-18 (×2): qty 1

## 2020-12-18 NOTE — Plan of Care (Signed)
  Problem: Education: Goal: Knowledge of disease or condition will improve Outcome: Progressing Goal: Knowledge of secondary prevention will improve Outcome: Progressing Goal: Knowledge of patient specific risk factors addressed and post discharge goals established will improve Outcome: Progressing   

## 2020-12-18 NOTE — H&P (Addendum)
History and Physical    Miguel [name] QHU:[id number] DOB: 09/22/1955 DOA: 12/18/2020  PCP: Josetta Huddle, MD  Patient coming from: home  Chief Complaint: fall, dizziness  HPI: Miguel [name] is a 65 y.o. male with medical history significant of hypertension, depression, dyspepsia, and anxiety.  3 days ago the patient was riding his lawnmower and hit a tree branch.  He hit his head.  He did not lose consciousness. He initially had some bleeding under his L eye and dizziness that seemed to be present prior to the injury.  After going to bed, he noticed that he will veer to the L when he walks. He is having some dizziness associated with it. He is not having any speech troubles, difficulty with swallowing, vision changes, or weakness that he can tell. He has been compliant with his medications. He has never had a stroke before.  He is a former smoker.  He reports having anaphylaxis when taking aspirin.  ED Course: Patient was riding his more 3 days ago and hit a tree hitting his head.  He was having dizziness, nausea, vomiting, repetitively falling, and a headache.  Plain films of the ribs were unremarkable.  Imaging of the head showed left inferior and posterior's infarct of the cerebellum in addition to a small infarct of the right thalamus.  Left distal vertebral artery occlusion was also noted on CTA of the neck.  Glucose noted to be 165, no history of diabetes. Sodium 134, though corrected it is 136.  The neurology team was consulted and they recommended admission.  Review of Systems: As per HPI otherwise 10 point review of systems negative.   Past Medical History:  Diagnosis Date  . Cervical dystonia   . Depression   . Erectile dysfunction due to arterial insufficiency   . Functional dyspepsia   . Hypertension   . Pancreatitis 2005   likely secondary to alcohol use  . Rosacea     Past Surgical History:  Procedure Laterality Date  . no past surgery       reports that he has quit  smoking. He has never used smokeless tobacco. He reports current alcohol use. He reports that he does not use drugs.  Allergies  Allergen Reactions  . Aspirin Anaphylaxis    Family History  Problem Relation Age of Onset  . Uterine cancer Mother   . Emphysema Father     Prior to Admission medications   Medication Sig Start Date End Date Taking? Authorizing Provider  acetaminophen (TYLENOL) 500 MG tablet Take 500 mg by mouth every 6 (six) hours as needed for moderate pain or headache.   Yes [provider]  buPROPion (WELLBUTRIN SR) 150 MG 12 hr tablet TAKE 1 TABLET BY MOUTH ONCE DAILY 90 Patient taking differently: Take 150 mg by mouth daily. 07/13/20 07/13/21 Yes Josetta Huddle, MD  Cholecalciferol (VITAMIN D) 2000 units CAPS Take 2,000 Units by mouth daily.   Yes [provider]  clonazePAM (KLONOPIN) 1 MG tablet TAKE 1 TABLET BY MOUTH ONCE A DAY Patient taking differently: Take 1 mg by mouth daily. 09/22/20 03/21/21 Yes Josetta Huddle, MD  esomeprazole (NEXIUM) 40 MG capsule Take 40 mg by mouth as needed (heartburn).   Yes [provider]  Multiple Vitamin (MULTIVITAMIN) capsule Take 1 capsule by mouth daily.   Yes [provider]  valsartan (DIOVAN) 80 MG tablet TAKE 1 TABLET BY MOUTH ONCE A DAY Patient taking differently: Take 80 mg by mouth daily. 08/10/20 08/10/21 Yes Josetta Huddle,  MD  venlafaxine XR (EFFEXOR-XR) 75 MG 24 hr capsule TAKE 1 CAPSULE BY MOUTH ONCE A DAY Patient taking differently: Take 75 mg by mouth daily. 08/10/20 08/10/21 Yes Josetta Huddle, MD  IncobotulinumtoxinA (XEOMIN IM) Inject 300 Units into the muscle every 3 (three) months. Patient not taking: No sig reported    [provider]    Physical Exam: Vitals:   12/18/20 0732 12/18/20 0904 12/18/20 0930 12/18/20 1051  BP: (!) 196/97 (!) 180/100 (!) 172/92 (!) 180/96  Pulse: (!) 58 60 (!) 57 65  Resp: 16 17 12 13   Temp: 98.1 F (36.7 C)     TempSrc: Oral     SpO2: 98%  97% 96% 95%  Weight:      Height:        Constitutional: NAD, calm, comfortable Eyes: PERRL, lids and conjunctivae normal ENMT: Mucous membranes are moist. Posterior pharynx clear of any exudate or lesions.Normal dentition.  Neck: normal, supple, no masses, no thyromegaly Respiratory: clear to auscultation bilaterally, no wheezing, no crackles. Normal respiratory effort. No accessory muscle use.  Cardiovascular: RRR. No extremity edema. 2+ pedal pulses. Abdomen: no tenderness, no masses palpated. No hepatosplenomegaly. Bowel sounds positive.  Musculoskeletal: no clubbing / cyanosis. No joint deformity upper and lower extremities.  Skin: no rashes, lesions, ulcers.  Neurologic: CN 2-12 grossly intact. Sensation intact, DTR normal.  Grip strength adequate, no pronator drift, fluent speech.  5/5 strength bilaterally in lower extremities. Psychiatric: Normal judgment and insight. Alert and oriented x 3. Normal mood.   Labs on Admission: I have personally reviewed following labs and imaging studies  CBC: Recent Labs  Lab 12/18/20 0852  WBC 4.5  NEUTROABS 3.1  HGB 14.2  HCT 40.6  MCV 99.5  PLT 937   Basic Metabolic Panel: Recent Labs  Lab 12/18/20 0559  NA 134*  K 4.0  CL 99  CO2 25  GLUCOSE 165*  BUN 9  CREATININE 0.69  CALCIUM 9.5   GFR: Estimated Creatinine Clearance: 102.4 mL/min (by C-G formula based on SCr of 0.69 mg/dL).   Liver Function Tests: Recent Labs  Lab 12/18/20 0559  AST 22  ALT 25  ALKPHOS 59  BILITOT 0.7  PROT 6.9  ALBUMIN 4.2   Urine analysis:    Component Value Date/Time   COLORURINE YELLOW 12/18/2020 [place] 12/18/2020 0643   LABSPEC 1.017 12/18/2020 [place] 6.0 12/18/2020 [place] 12/18/2020 0643   HGBUR NEGATIVE 12/18/2020 0643   BILIRUBINUR NEGATIVE 12/18/2020 0643   KETONESUR 80 (A) 12/18/2020 0643   PROTEINUR NEGATIVE 12/18/2020 0643   NITRITE NEGATIVE 12/18/2020 0643   LEUKOCYTESUR  NEGATIVE 12/18/2020 0643   Radiological Exams on Admission: CT ANGIO HEAD NECK W WO CM  Result Date: 12/18/2020 CLINICAL DATA:  Stroke EXAM: CT ANGIOGRAPHY HEAD AND NECK TECHNIQUE: Multidetector CT imaging of the head and neck was performed using the standard protocol during bolus administration of intravenous contrast. Multiplanar CT image reconstructions and MIPs were obtained to evaluate the vascular anatomy. Carotid stenosis measurements (when applicable) are obtained utilizing NASCET criteria, using the distal internal carotid diameter as the denominator. CONTRAST:  49mL OMNIPAQUE IOHEXOL 350 MG/ML SOLN COMPARISON:  MRI head 12/18/2020.  CT head 12/18/2020 FINDINGS: CTA NECK FINDINGS Aortic arch: Standard branching. Imaged portion shows no evidence of aneurysm or dissection. No significant stenosis of the major arch vessel origins. Right carotid system: Atherosclerotic plaque right carotid bifurcation with calcified and noncalcified plaque. 50% diameter stenosis proximal  right internal carotid artery. Left carotid system: Atherosclerotic disease left carotid bifurcation which is predominately calcified. No significant stenosis. Vertebral arteries: Right vertebral dominant and patent to the basilar without stenosis. Moderate to severe stenosis at the origin of the left vertebral artery. Small left vertebral artery with fading opacification distally and occlusion at the C1 level. Skeleton: Cervical spondylosis.  No acute skeletal abnormality. Other neck: Negative for mass or adenopathy. Upper chest: Lung apices clear bilaterally. Review of the MIP images confirms the above findings CTA HEAD FINDINGS Anterior circulation: Cavernous carotid widely patent bilaterally. Anterior and middle cerebral arteries patent bilaterally without stenosis or large vessel occlusion. Posterior circulation: Right vertebral artery widely patent and supplies the basilar. Right PICA patent. Distal left vertebral artery occluded.  Possible faint retrograde flow in the left V4 segment. Left PICA not visualized. Basilar widely patent. AICA patent bilaterally. Superior cerebellar and posterior cerebral arteries patent bilaterally. Venous sinuses: Normal venous enhancement. Anatomic variants: None Review of the MIP images confirms the above findings IMPRESSION: 1. Moderate to severe stenosis origin of left vertebral artery. Occlusion distal left vertebral artery at C1 which appears acute. Left PICA not visualized. Patient has acute left PICA infarct on MRI. 2. Atherosclerotic disease in the carotid bifurcation bilaterally. 50% diameter stenosis proximal right internal carotid artery. No significant left carotid stenosis. Electronically Signed   By: Franchot Gallo M.D.   On: 12/18/2020 10:29   DG Ribs Unilateral W/Chest Right  Result Date: 12/18/2020 CLINICAL DATA:  Fall, right rib pain EXAM: RIGHT RIBS AND CHEST - 3+ VIEW COMPARISON:  None. FINDINGS: No fracture or other bone lesions are seen involving the ribs. There is no evidence of pneumothorax or pleural effusion. Both lungs are clear. Heart size and mediastinal contours are within normal limits. IMPRESSION: Negative. Electronically Signed   By: Fidela Salisbury MD   On: 12/18/2020 07:13   CT Head Wo Contrast  Result Date: 12/18/2020 CLINICAL DATA:  65 year old male struck by tree limb while working in yard. Subsequent nausea and vomiting. EXAM: CT HEAD WITHOUT CONTRAST TECHNIQUE: Contiguous axial images were obtained from the base of the skull through the vertex without intravenous contrast. COMPARISON:  Cervical spine MRI 11/15/2004. FINDINGS: Brain: No midline shift, ventriculomegaly, mass effect, evidence of mass lesion, intracranial hemorrhage or evidence of cortically based acute infarction. Subtle asymmetric hypodensity in the central left cerebellum best seen on series 3, image 9. Elsewhere gray-white matter differentiation is within normal limits. No cerebral or cerebellar  cortical encephalomalacia identified. Right side posterosuperior frontal gyrus congenital sulcal variation suspected (series 3, image 31). Vascular: Mild Calcified atherosclerosis at the skull base. No suspicious intracranial vascular hyperdensity. Skull: No fracture identified. Sinuses/Orbits: Small low-density fluid level plus circumferential mucosal thickening in the left maxillary sinus appears inflammatory. Trace sinus mucosal thickening elsewhere. Tympanic cavities are clear. Bilateral mastoids are well aerated. Other: Leftward gaze. Otherwise negative orbits soft tissues. No discrete scalp soft tissue injury identified. IMPRESSION: 1.  No acute traumatic injury identified. 2. Largely normal for age noncontrast CT appearance of the brain. But subtle asymmetric hypodensity in the central left cerebellum might be the sequelae of small vessel ischemia. 3. Mild paranasal sinus inflammation. Electronically Signed   By: Genevie Ann M.D.   On: 12/18/2020 04:22   CT Cervical Spine Wo Contrast  Result Date: 12/18/2020 CLINICAL DATA:  65 year old male struck by tree limb while working in yard. Subsequent nausea and vomiting. EXAM: CT CERVICAL SPINE WITHOUT CONTRAST TECHNIQUE: Multidetector CT imaging of the cervical  spine was performed without intravenous contrast. Multiplanar CT image reconstructions were also generated. COMPARISON:  Cervical spine MRI 11/15/2004 and earlier. Head CT today reported separately. FINDINGS: Alignment: Stable cervical lordosis since 2006. Cervicothoracic junction alignment is within normal limits. Bilateral posterior element alignment is within normal limits. Skull base and vertebrae: Visualized skull base is intact. No atlanto-occipital dissociation. Normal C1-C2 alignment. No acute osseous abnormality identified. Soft tissues and spinal canal: No prevertebral fluid or swelling. No visible canal hematoma. Negative noncontrast neck soft tissues aside from carotid calcified atherosclerosis.  Disc levels: Degenerative subchondral cyst of the odontoid process suspected. Chronic ankylosis C2 on C3 is new since 2006. Advanced cervical spine disc and endplate degeneration elsewhere, with multilevel mild lower cervical spinal stenosis suspected. Upper chest: Visible upper thoracic levels and lung apices appear negative. Other: None. IMPRESSION: 1. No acute traumatic injury identified in the cervical spine. 2. Ankylosis of C2-C3 since 2006. Advanced cervical disc and endplate degeneration elsewhere, with subsequent multilevel mild spinal stenosis suspected. Electronically Signed   By: Genevie Ann M.D.   On: 12/18/2020 04:26   MR BRAIN WO CONTRAST  Addendum Date: 12/18/2020   ADDENDUM REPORT: 12/18/2020 08:38 ADDENDUM: Note is made of marked atrophy of the posterior paraspinous muscles in the upper neck, right greater than left. This was not present on the prior cervical MRI of 11/15/2004. Electronically Signed   By: Franchot Gallo M.D.   On: 12/18/2020 08:38   Result Date: 12/18/2020 CLINICAL DATA:  Dizziness. EXAM: MRI HEAD WITHOUT CONTRAST TECHNIQUE: Multiplanar, multiecho pulse sequences of the brain and surrounding structures were obtained without intravenous contrast. COMPARISON:  CT head 12/18/2020 FINDINGS: Brain: Scattered small areas of acute infarct in the left posterior and inferior cerebellum corresponding to CT hypodensity. Additional small infarct in the right thalamus. Generalized atrophy. Mild white matter changes consistent with chronic microvascular ischemia. Negative for hemorrhage or mass. Vascular: Abnormal flow related signal distal left vertebral artery which may be stenotic or occluded. Otherwise normal arterial flow voids at the base of brain. Skull and upper cervical spine: No focal abnormality. Sinuses/Orbits: Mucosal edema paranasal sinuses.  Negative orbit Other: None IMPRESSION: Acute infarct left inferior and posterior cerebellum. Small acute infarct right thalamus.  Electronically Signed: By: Franchot Gallo M.D. On: 12/18/2020 08:25    EKG: Independently reviewed.   Assessment/Plan Principal Problem:   Acute CVA (cerebrovascular accident) [facility])  [place] neurology  Start Plavix 75 mg daily  Unfortunately he has anaphylaxis to aspirin  Start Lipitor 80 mg daily  Check A1c and lipid panel  Consult PT/OT  General diet as he passed his bedside swallow  Zofran for nausea  Stenosis noted on CTA of the distal left vertebral artery  Cardiac monitoring  Active Problems:   Depression with anxiety  Continue Wellbutrin 150 mg daily  Continue Effexor XR 75 mg daily  Continue Klonopin 1 mg daily    Essential hypertension  Restart valsartan 80 mg daily  DVT prophylaxis: Lovenox Code Status: Full  Family Communication: Self, wife at bedside Disposition Plan: Home Consults called: Neurology  Admission status: Inpatient   Severity of Illness: The appropriate patient status for this patient is INPATIENT. Inpatient status is judged to be reasonable and necessary in order to provide the required intensity of service to ensure the patient's safety. The patient's presenting symptoms, physical exam findings, and initial radiographic and laboratory data in the context of their chronic comorbidities is felt to place them at high risk for further clinical deterioration. Furthermore, it  is not anticipated that the patient will be medically stable for discharge from the hospital within 2 midnights of admission. The following factors support the patient status of inpatient.   " The patient's presenting symptoms include fall. " The worrisome physical exam findings include leftward gait deviation. " The initial radiographic and laboratory data are worrisome because of evidence of stroke on MRI and CT. " The chronic co-morbidities include hypertension, depression and anxiety.   * I certify that at the point of admission it is my clinical judgment that the patient  will require inpatient hospital care spanning beyond 2 midnights from the point of admission due to high intensity of service, high risk for further deterioration and high frequency of surveillance required.*   Shelda Pal, DO [facility] [email or web address] 12/18/2020, 10:58 AM

## 2020-12-18 NOTE — ED Notes (Signed)
Signature pad not working, patient and wife verbalize understanding of MSE.

## 2020-12-18 NOTE — ED Triage Notes (Signed)
Patient reports he was cutting grass and a tree limb hit him in the face, patient with nausea and vomiting since injury, also reports gait abnormality, wife reports multiple falls at home, denies blood thinners.

## 2020-12-18 NOTE — Evaluation (Signed)
Physical Therapy Evaluation Patient Details Name: Miguel [name] MRN: [id number] DOB: November 10, 1955 Today's Date: 12/18/2020   History of Present Illness  65 yo male with onset of new L cerebellar and R thalamic infarcts was brought to ED, noted torticollis with no ins to get injection.  PMHx:  EtOH, pancreatitis, multi level spinal stenosis C-spine, ankylosed C2-C3, cervical disc endplate degeneration, HTN, atherosclerosis, torticollis  Clinical Impression  Pt was seen for evaluation of coordination of gait and transfers, and noted his struggle to stand or walk safely. Pt also lacks protective extension on L side with his LOB.  Pt is interested in rehab, and hopefully can qualify to increase safety and independence.  Wife in attendance, and will be able to support his efforts with rehab.  However, she is working full time and will need to determine how he can be managed safely if rehab is not a possibility.  Follow as ordered for goals of acute PT.      Follow Up Recommendations CIR    Equipment Recommendations  None recommended by PT    Recommendations for Other Services Rehab consult     Precautions / Restrictions Precautions Precautions: Fall Precaution Comments: light headed with elevated BP in standing Restrictions Weight Bearing Restrictions: No Other Position/Activity Restrictions: falls to L side in standing      Mobility  Bed Mobility Overal bed mobility: Needs Assistance Bed Mobility: Supine to Sit;Sit to Supine     Supine to sit: Min assist Sit to supine: Min assist   General bed mobility comments: min assist to support trunk OOB and min assist to support legs back to bed    Transfers Overall transfer level: Needs assistance Equipment used: Rolling walker (2 wheeled);2 person hand held assist Transfers: Sit to/from Stand Sit to Stand: Min assist;Mod assist         General transfer comment: min to start but mod to capture balance in  standing  Ambulation/Gait Ambulation/Gait assistance: Min assist;Mod assist Gait Distance (Feet): 8 Feet Assistive device: Rolling walker (2 wheeled);2 person hand held assist Gait Pattern/deviations: Step-to pattern;Decreased stride length;Wide base of support Gait velocity: reduced   General Gait Details: sidestepping on side of bed as pt is too unstable to L side  Stairs            Wheelchair Mobility    Modified Rankin (Stroke Patients Only) Modified Rankin (Stroke Patients Only) Pre-Morbid Rankin Score: No symptoms Modified Rankin: Moderately severe disability     Balance Overall balance assessment: Needs assistance Sitting-balance support: Feet supported Sitting balance-Leahy Scale: Fair     Standing balance support: Bilateral upper extremity supported;During functional activity Standing balance-Leahy Scale: Poor Standing balance comment: strong list to L side                             Pertinent Vitals/Pain Pain Assessment: No/denies pain    Home Living Family/patient expects to be discharged to:: Inpatient rehab                      Prior Function Level of Independence: Independent         Comments: pt was working until 3 weeks ago     Hand Dominance   Dominant Hand: Right    Extremity/Trunk Assessment   Upper Extremity Assessment Upper Extremity Assessment: Overall WFL for tasks assessed    Lower Extremity Assessment Lower Extremity Assessment: RLE deficits/detail;LLE deficits/detail RLE Deficits / Details: strength  WFL but mobility is slow LLE Deficits / Details: strength is WFL but mobilty moderately incoordinated LLE Coordination: decreased gross motor    Cervical / Trunk Assessment Cervical / Trunk Assessment: Normal  Communication   Communication: No difficulties  Cognition Arousal/Alertness: Awake/alert Behavior During Therapy: Impulsive Overall Cognitive Status: Impaired/Different from baseline Area of  Impairment: Safety/judgement;Awareness;Problem solving;Following commands                       Following Commands: Follows one step commands inconsistently Safety/Judgement: Decreased awareness of safety;Decreased awareness of deficits Awareness: Intellectual Problem Solving: Slow processing;Requires verbal cues;Requires tactile cues General Comments: pt is expressing issue with not being up moving more although he is light headed and not feeling well once standing      General Comments General comments (skin integrity, edema, etc.): pt is up to side of bed with help and to stand mult times with unsafe presentation    Exercises     Assessment/Plan    PT Assessment Patient needs continued PT services  PT Problem List Decreased strength;Decreased balance;Decreased mobility;Decreased coordination;Decreased safety awareness;Cardiopulmonary status limiting activity       PT Treatment Interventions DME instruction;Gait training;Stair training;Functional mobility training;Therapeutic activities;Therapeutic exercise;Balance training;Neuromuscular re-education;Patient/family education    PT Goals (Current goals can be found in the Care Plan section)  Acute Rehab PT Goals Patient Stated Goal: to get stronger and be able to walk PT Goal Formulation: With patient/family Time For Goal Achievement: 01/01/21 Potential to Achieve Goals: Good    Frequency Min 4X/week   Barriers to discharge Inaccessible home environment;Decreased caregiver support home alone if wife is working and has stairs to enter house    Co-evaluation               AM-PAC PT "6 Clicks" Mobility  Outcome Measure Help needed turning from your back to your side while in a flat bed without using bedrails?: A Little Help needed moving from lying on your back to sitting on the side of a flat bed without using bedrails?: A Little Help needed moving to and from a bed to a chair (including a wheelchair)?: A  Lot Help needed standing up from a chair using your arms (e.g., wheelchair or bedside chair)?: A Lot Help needed to walk in hospital room?: A Lot Help needed climbing 3-5 steps with a railing? : Total 6 Click Score: 13    End of Session Equipment Utilized During Treatment: Gait belt Activity Tolerance: Patient limited by fatigue;Treatment limited secondary to medical complications (Comment) Patient left: in bed;with call bell/phone within reach;with family/visitor present Nurse Communication: Mobility status PT Visit Diagnosis: Unsteadiness on feet (R26.81);Ataxic gait (R26.0);Difficulty in walking, not elsewhere classified (R26.2)    Time: 8185-6314 PT Time Calculation (min) (ACUTE ONLY): 34 min   Charges:   PT Evaluation $PT Eval Moderate Complexity: 1 Mod PT Treatments $Gait Training: 8-22 mins       Ramond Dial 12/18/2020, 6:52 PM Mee Hives, PT MS Acute Rehab Dept. Number: [place]

## 2020-12-18 NOTE — Progress Notes (Addendum)
Carotid duplex bilateral study completed.   Please see CV Proc for preliminary results.   Rashauna Tep, RDMS, RVT  

## 2020-12-18 NOTE — Progress Notes (Signed)
2D echocardiogram completed.  12/18/2020 3:41 PM Kelby Aline., MHA, RVT, RDCS, RDMS

## 2020-12-18 NOTE — Progress Notes (Signed)
2D echocardiogram completed.  12/18/2020 4:01 PM Kelby Aline., MHA, RVT, RDCS, RDMS

## 2020-12-18 NOTE — ED Notes (Signed)
Patient transported to CT 

## 2020-12-18 NOTE — ED Notes (Signed)
Pt returned from MRI °

## 2020-12-18 NOTE — ED Notes (Signed)
Attempted iv x 2 unsuccessful. Labs were drawn however

## 2020-12-18 NOTE — ED Provider Notes (Signed)
Physical Exam  BP (!) 172/92   Pulse (!) 57   Temp 98.1 F (36.7 C) (Oral)   Resp 12   Ht 6' (1.829 m)   Wt 89.4 kg   SpO2 96%   BMI 26.72 kg/m   Physical Exam Vitals and nursing note reviewed.  Constitutional:      Appearance: Normal appearance.  Pulmonary:     Effort: No respiratory distress.  Abdominal:     General: There is no distension.  Skin:    General: Skin is warm and dry.  Neurological:     Mental Status: He is alert and oriented to person, place, and time.     ED Course/Procedures     Procedures  MDM  Pt signed out to me by S Upstill, PA-C. Please see previous notes for further history.   In brief, pt presenting for evaluation of falls, dizziness, n/v. Pt has had dizziness for the past few months, ever since having a minor head injury. A few days ago pt had another minor head injury after accidentally hitting a tree with his riding mower. He has a h/o HTN, but been unable to take his meds due to n/v. He describes it as feeling room is spinning. Work up has abnormal head CT. Exam shows pt leaning towards the L. MRI and labs pending.   MRI consistent with acute stroke.  Will consult neurology.  Discussed with Dr. Leonel Ramsay from neurology who will evaluate the patient.  Requesting medicine admit.  Requests aspirin given, however per chart review, patient with anaphylaxis to aspirin.  I confirmed this with the patient.  Discussed with Dr. Nani Ravens from [facility], pt to be admitted.    Results for orders placed or performed during the hospital encounter of 12/18/20  Comprehensive metabolic panel  Result Value Ref Range   Sodium 134 (L) 135 - 145 mmol/L   Potassium 4.0 3.5 - 5.1 mmol/L   Chloride 99 98 - 111 mmol/L   CO2 25 22 - 32 mmol/L   Glucose, Bld 165 (H) 70 - 99 mg/dL   BUN 9 8 - 23 mg/dL   Creatinine, Ser 0.69 0.61 - 1.24 mg/dL   Calcium 9.5 8.9 - 10.3 mg/dL   Total Protein 6.9 6.5 - 8.1 g/dL   Albumin 4.2 3.5 - 5.0 g/dL   AST  22 15 - 41 U/L   ALT 25 0 - 44 U/L   Alkaline Phosphatase 59 38 - 126 U/L   Total Bilirubin 0.7 0.3 - 1.2 mg/dL   GFR, Estimated >60 >60 mL/min   Anion gap 10 5 - 15  Urinalysis, Routine w reflex microscopic Urine, Clean Catch  Result Value Ref Range   Color, Urine YELLOW YELLOW   APPearance CLEAR CLEAR   Specific Gravity, Urine 1.017 1.005 - 1.030   pH 6.0 5.0 - 8.0   Glucose, UA NEGATIVE NEGATIVE mg/dL   Hgb urine dipstick NEGATIVE NEGATIVE   Bilirubin Urine NEGATIVE NEGATIVE   Ketones, ur 80 (A) NEGATIVE mg/dL   Protein, ur NEGATIVE NEGATIVE mg/dL   Nitrite NEGATIVE NEGATIVE   Leukocytes,Ua NEGATIVE NEGATIVE  CBC with Differential/Platelet  Result Value Ref Range   WBC 4.5 4.0 - 10.5 K/uL   RBC 4.08 (L) 4.22 - 5.81 MIL/uL   Hemoglobin 14.2 13.0 - 17.0 g/dL   HCT 40.6 39.0 - 52.0 %   MCV 99.5 80.0 - 100.0 fL   MCH 34.8 (H) 26.0 - 34.0 pg   MCHC 35.0 30.0 - 36.0  g/dL   RDW 12.0 11.5 - 15.5 %   Platelets 210 150 - 400 K/uL   nRBC 0.0 0.0 - 0.2 %   Neutrophils Relative % 71 %   Neutro Abs 3.1 1.7 - 7.7 K/uL   Lymphocytes Relative 19 %   Lymphs Abs 0.9 0.7 - 4.0 K/uL   Monocytes Relative 10 %   Monocytes Absolute 0.4 0.1 - 1.0 K/uL   Eosinophils Relative 0 %   Eosinophils Absolute 0.0 0.0 - 0.5 K/uL   Basophils Relative 0 %   Basophils Absolute 0.0 0.0 - 0.1 K/uL   Immature Granulocytes 0 %   Abs Immature Granulocytes 0.01 0.00 - 0.07 K/uL   DG Ribs Unilateral W/Chest Right  Result Date: 12/18/2020 CLINICAL DATA:  Fall, right rib pain EXAM: RIGHT RIBS AND CHEST - 3+ VIEW COMPARISON:  None. FINDINGS: No fracture or other bone lesions are seen involving the ribs. There is no evidence of pneumothorax or pleural effusion. Both lungs are clear. Heart size and mediastinal contours are within normal limits. IMPRESSION: Negative. Electronically Signed   By: Fidela Salisbury MD   On: 12/18/2020 07:13   CT Head Wo Contrast  Result Date: 12/18/2020 CLINICAL DATA:  65 year old male  struck by tree limb while working in yard. Subsequent nausea and vomiting. EXAM: CT HEAD WITHOUT CONTRAST TECHNIQUE: Contiguous axial images were obtained from the base of the skull through the vertex without intravenous contrast. COMPARISON:  Cervical spine MRI 11/15/2004. FINDINGS: Brain: No midline shift, ventriculomegaly, mass effect, evidence of mass lesion, intracranial hemorrhage or evidence of cortically based acute infarction. Subtle asymmetric hypodensity in the central left cerebellum best seen on series 3, image 9. Elsewhere gray-white matter differentiation is within normal limits. No cerebral or cerebellar cortical encephalomalacia identified. Right side posterosuperior frontal gyrus congenital sulcal variation suspected (series 3, image 31). Vascular: Mild Calcified atherosclerosis at the skull base. No suspicious intracranial vascular hyperdensity. Skull: No fracture identified. Sinuses/Orbits: Small low-density fluid level plus circumferential mucosal thickening in the left maxillary sinus appears inflammatory. Trace sinus mucosal thickening elsewhere. Tympanic cavities are clear. Bilateral mastoids are well aerated. Other: Leftward gaze. Otherwise negative orbits soft tissues. No discrete scalp soft tissue injury identified. IMPRESSION: 1.  No acute traumatic injury identified. 2. Largely normal for age noncontrast CT appearance of the brain. But subtle asymmetric hypodensity in the central left cerebellum might be the sequelae of small vessel ischemia. 3. Mild paranasal sinus inflammation. Electronically Signed   By: Genevie Ann M.D.   On: 12/18/2020 04:22   CT Cervical Spine Wo Contrast  Result Date: 12/18/2020 CLINICAL DATA:  65 year old male struck by tree limb while working in yard. Subsequent nausea and vomiting. EXAM: CT CERVICAL SPINE WITHOUT CONTRAST TECHNIQUE: Multidetector CT imaging of the cervical spine was performed without intravenous contrast. Multiplanar CT image reconstructions  were also generated. COMPARISON:  Cervical spine MRI 11/15/2004 and earlier. Head CT today reported separately. FINDINGS: Alignment: Stable cervical lordosis since 2006. Cervicothoracic junction alignment is within normal limits. Bilateral posterior element alignment is within normal limits. Skull base and vertebrae: Visualized skull base is intact. No atlanto-occipital dissociation. Normal C1-C2 alignment. No acute osseous abnormality identified. Soft tissues and spinal canal: No prevertebral fluid or swelling. No visible canal hematoma. Negative noncontrast neck soft tissues aside from carotid calcified atherosclerosis. Disc levels: Degenerative subchondral cyst of the odontoid process suspected. Chronic ankylosis C2 on C3 is new since 2006. Advanced cervical spine disc and endplate degeneration elsewhere, with multilevel mild lower cervical  spinal stenosis suspected. Upper chest: Visible upper thoracic levels and lung apices appear negative. Other: None. IMPRESSION: 1. No acute traumatic injury identified in the cervical spine. 2. Ankylosis of C2-C3 since 2006. Advanced cervical disc and endplate degeneration elsewhere, with subsequent multilevel mild spinal stenosis suspected. Electronically Signed   By: Genevie Ann M.D.   On: 12/18/2020 04:26   MR BRAIN WO CONTRAST  Addendum Date: 12/18/2020   ADDENDUM REPORT: 12/18/2020 08:38 ADDENDUM: Note is made of marked atrophy of the posterior paraspinous muscles in the upper neck, right greater than left. This was not present on the prior cervical MRI of 11/15/2004. Electronically Signed   By: Franchot Gallo M.D.   On: 12/18/2020 08:38   Result Date: 12/18/2020 CLINICAL DATA:  Dizziness. EXAM: MRI HEAD WITHOUT CONTRAST TECHNIQUE: Multiplanar, multiecho pulse sequences of the brain and surrounding structures were obtained without intravenous contrast. COMPARISON:  CT head 12/18/2020 FINDINGS: Brain: Scattered small areas of acute infarct in the left posterior and  inferior cerebellum corresponding to CT hypodensity. Additional small infarct in the right thalamus. Generalized atrophy. Mild white matter changes consistent with chronic microvascular ischemia. Negative for hemorrhage or mass. Vascular: Abnormal flow related signal distal left vertebral artery which may be stenotic or occluded. Otherwise normal arterial flow voids at the base of brain. Skull and upper cervical spine: No focal abnormality. Sinuses/Orbits: Mucosal edema paranasal sinuses.  Negative orbit Other: None IMPRESSION: Acute infarct left inferior and posterior cerebellum. Small acute infarct right thalamus. Electronically Signed: By: Franchot Gallo M.D. On: 12/18/2020 08:25         Franchot Heidelberg, PA-C 12/18/20 1057    Lacretia Leigh, MD 12/19/20 [phone]

## 2020-12-18 NOTE — ED Notes (Signed)
Hit in the head with a tree limb 2 days ago. Since then has been dizzy, light headed, nausea, unsteady gait, walks to the left and multiple falls. Also has been hypertensive.

## 2020-12-18 NOTE — Consult Note (Signed)
NEUROLOGY CONSULTATION NOTE   Date of service: December 18, 2020 Patient Name: Miguel [name] MRN:  [id number] DOB:  24-Jul-1956 Reason for consult: "Stroke" _ _ _   _ __   _ __ _ _  __ __   _ __   __ _  History of Present Illness  Miguel [name] is a 65 y.o. male with PMH significant for  has a past medical history of Cervical dystonia, Depression, Erectile dysfunction due to arterial insufficiency, Functional dyspepsia, Hypertension, Pancreatitis (2005), and Rosacea. who presents with dizziness and falls.  He reports that he lost his job about 3 weeks ago. At that time he had already been overdue for a Botox injection for his spasmatic torticollis and now cannot afford it and so has not had his injection. He reports that his dizziness and falls started with the return of his neck pain similar to previous times his torticollis flairs. However, on Wednesday afternoon he was riding his lawn mover when he ran into a branch on his left face. He finished mowing the lawn and then when he was getting down he feel. After that his evening was otherwise normal. Thursday when he woke up he reports that he felt sick. He had several rounds of emesis and his dizziness was significantly worse. He then began leaning to the left when ever he would walk and he had to hold onto the wall to not fall. Because of the loss of insurance he did not go to the hospital. This morning when he still felt sick and was throwing up and had issues walking his wife brought him into the hospital.   His wife reports that he drinks 8-12 beers a day.   ROS   Constitutional Denies weight loss, fever and chills.   HEENT Denies changes in vision and hearing.   Respiratory Denies SOB and cough.   CV Denies palpitations and CP   GI Has had nausea and vomiting and some abdominal pain  GU Denies dysuria and urinary frequency.   MSK Denies myalgia and joint pain.   Skin Denies rash and pruritus.   Neurological Denies headache and syncope.    Psychiatric Denies recent changes in mood. Denies anxiety and depression.    Past History   Past Medical History:  Diagnosis Date  . Cervical dystonia   . Depression   . Erectile dysfunction due to arterial insufficiency   . Functional dyspepsia   . Hypertension   . Pancreatitis 2005   likely secondary to alcohol use  . Rosacea    Past Surgical History:  Procedure Laterality Date  . no past surgery     Family History  Problem Relation Age of Onset  . Uterine cancer Mother   . Emphysema Father    Social History   Socioeconomic History  . Marital status: Married    Spouse name: Not on file  . Number of children: 0  . Years of education: some college  . Highest education level: Not on file  Occupational History  . Occupation: Associate Professor  Tobacco Use  . Smoking status: Former Research scientist (life sciences)  . Smokeless tobacco: Never Used  Substance and Sexual Activity  . Alcohol use: Yes    Comment: 3 beers per day  . Drug use: No  . Sexual activity: Not on file  Other Topics Concern  . Not on file  Social History Narrative   Lives at home with his wife.   3 cups of caffeine day.   Right-handed.  Social Determinants of Health   Financial Resource Strain: Not on file  Food Insecurity: Not on file  Transportation Needs: Not on file  Physical Activity: Not on file  Stress: Not on file  Social Connections: Not on file   Allergies  Allergen Reactions  . Aspirin Anaphylaxis    Medications  (Not in a hospital admission)    Vitals   Vitals:   12/18/20 0732 12/18/20 0904 12/18/20 0930 12/18/20 1051  BP: (!) 196/97 (!) 180/100 (!) 172/92 (!) 180/96  Pulse: (!) 58 60 (!) 57 65  Resp: 16 17 12 13   Temp: 98.1 F (36.7 C)     TempSrc: Oral     SpO2: 98% 97% 96% 95%  Weight:      Height:         Body mass index is 26.72 kg/m.  Physical Exam   General: Laying comfortably in bed; in no acute distress.  HENT: Normal oropharynx and mucosa. Normal external  appearance of ears and nose.  Neck: Supple, no pain or tenderness  CV: No JVD. No peripheral edema.  Pulmonary: Symmetric Chest rise. Normal respiratory effort.  Abdomen: Soft to touch, non-tender.  Ext: No cyanosis, edema, or deformity  Skin: No rash. Normal palpation of skin.   Musculoskeletal: Normal digits and nails by inspection. No clubbing.   Neurologic Examination  Mental status/Cognition: Alert, oriented to self, place, month and year, good attention.  Speech/language: Fluent, comprehension intact, object naming intact, repetition intact.  Cranial nerves:   CN II Pupils equal and reactive to light, no VF deficits    CN III,IV,VI EOM intact, no gaze preference or deviation, no nystagmus    CN V normal sensation in V1, V2, and V3 segments bilaterally    CN VII Chronic mild left lower facial droop, no nasolabial fold flattening    CN VIII normal hearing to speech    CN IX & X normal palatal elevation, no uvular deviation    CN XI 5/5 head turn and 5/5 shoulder shrug bilaterally    CN XII midline tongue protrusion    Motor:  RUE: 5/5   LUE: 5/5 RLE: 5/5   LLE: 5/5 Muscle bulk: normal, tone normal, no pronator drift or tremor present   Sensation:  Light touch    Pin prick    Temperature    Vibration   Proprioception    Coordination/Complex Motor:  - Finger to Nose intact bilaterally - Heel to shin abnormal Left Heel to Right shin - Rapid alternating movement abnormal left clapping - Gait: deferred  Labs   CBC:  Recent Labs  Lab 12/18/20 0852  WBC 4.5  NEUTROABS 3.1  HGB 14.2  HCT 40.6  MCV 99.5  PLT 681    Basic Metabolic Panel:  Lab Results  Component Value Date   NA 134 (L) 12/18/2020   K 4.0 12/18/2020   CO2 25 12/18/2020   GLUCOSE 165 (H) 12/18/2020   BUN 9 12/18/2020   CREATININE 0.69 12/18/2020   CALCIUM 9.5 12/18/2020   GFRNONAA >60 12/18/2020   Lipid Panel: No results found for: LDLCALC HgbA1c: No results found for: HGBA1C Urine Drug  Screen: No results found for: LABOPIA, COCAINSCRNUR, LABBENZ, AMPHETMU, THCU, LABBARB  Alcohol Level No results found for: [facility]  CT Head without contrast: No acute traumatic injury identified. Largely normal for age noncontrast CT appearance of the brain. But subtle asymmetric hypodensity in the central left cerebellum might be the sequelae of small vessel ischemia. Mild paranasal sinus  inflammation.  MRI Brain Acute infarct left inferior and posterior cerebellum. Small acute infarct right thalamus.   CT angio Head and Neck with contrast: Moderate to severe stenosis origin of left vertebral artery. Occlusion distal left vertebral artery at C1 which appears acute. Left PICA not visualized. Patient has acute left PICA infarct on MRI. Atherosclerotic disease in the carotid bifurcation bilaterally. 50% diameter stenosis proximal right internal carotid artery. No significant left carotid stenosis.   Impression   Miguel [name] is a 65 y.o. male with PMH significant for hypertension, depression, dyspepsia, and anxiety. His neurologic examination is notable for Cerebellar deficits. He has a severe allergy to Aspirin so will not start DAPT will start Plavix with a loading dose and continue with daily Plavix. Will start Statin after Lipid panel results. Continue with Stroke work up. As per wife he drinks 8-12 beers a day will start CIWA monitoring.  Recommendations  -Lipid Panel, A1C ordered -2D Echo Ordered -Vas US Carotid ordered -SLP/PT/OT evaluations ordered -Plavix start with loading dose and 75 mg daily starting tomorrow -Start Statin after Lipid panel results -Start CIWA with Ativan PRN -Start multivitamin ______________________________________________________________________   Fatima Sanger MD Resident

## 2020-12-18 NOTE — ED Provider Notes (Signed)
[facility] EMERGENCY DEPARTMENT Provider Note   CSN: [id number] Arrival date & time: 12/18/20  0325     History Chief Complaint  Patient presents with  . Fall  . Head Injury    Miguel [name] is a 65 y.o. male.  Patient with history of HTN presents after head injury 4/12. He has had persistent worsening symptoms of dizziness, nausea, vomiting, off balance while walking with a consistent fall to the left, multiple falls, headache. He was mowing with a riding mower and impacted with a tree, hitting his head. No LOC. He denies fever, cough, chest pain, SOB. Wife reports that he has a similar minor head injury 3 months ago and has had some dizziness since that time. Wife also reports his blood pressure has been elevated today more than usual.   The history is provided by the patient and the spouse. No language interpreter was used.  Fall Associated symptoms include headaches. Pertinent negatives include no chest pain and no shortness of breath.  Head Injury Associated symptoms: headache, nausea, neck pain (Chronic) and vomiting        Past Medical History:  Diagnosis Date  . Cervical dystonia   . Cervicalgia   . Congenital deformity of sternocleidomastoid muscle   . Decreased libido   . Depression   . Erectile dysfunction due to arterial insufficiency   . Functional dyspepsia   . Hyperglycemia   . Hypertension   . Neoplasm of uncertain behavior of skin   . Pancreatitis 2005   likely secondary to alcohol use  . Rosacea   . Testicular hypofunction   . Torticollis     Patient Active Problem List   Diagnosis Date Noted  . Isolated cervical dystonia 12/06/2016    Past Surgical History:  Procedure Laterality Date  . no past surgery         Family History  Problem Relation Age of Onset  . Uterine cancer Mother   . Emphysema Father     Social History   Tobacco Use  . Smoking status: Former Research scientist (life sciences)  . Smokeless tobacco: Never Used  Substance Use  Topics  . Alcohol use: Yes    Comment: 3 beers per day  . Drug use: No    Home Medications Prior to Admission medications   Medication Sig Start Date End Date Taking? Authorizing Provider  acetaminophen (TYLENOL) 500 MG tablet Take 500 mg by mouth every 6 (six) hours as needed for moderate pain or headache.   Yes [provider]  buPROPion (WELLBUTRIN SR) 150 MG 12 hr tablet TAKE 1 TABLET BY MOUTH ONCE DAILY 90 Patient taking differently: Take 150 mg by mouth daily. 07/13/20 07/13/21 Yes Josetta Huddle, MD  Cholecalciferol (VITAMIN D) 2000 units CAPS Take 2,000 Units by mouth daily.   Yes [provider]  clonazePAM (KLONOPIN) 1 MG tablet TAKE 1 TABLET BY MOUTH ONCE A DAY Patient taking differently: Take 1 mg by mouth daily. 09/22/20 03/21/21 Yes Josetta Huddle, MD  esomeprazole (NEXIUM) 40 MG capsule Take 40 mg by mouth as needed (heartburn).   Yes [provider]  Multiple Vitamin (MULTIVITAMIN) capsule Take 1 capsule by mouth daily.   Yes [provider]  valsartan (DIOVAN) 80 MG tablet TAKE 1 TABLET BY MOUTH ONCE A DAY Patient taking differently: Take 80 mg by mouth daily. 08/10/20 08/10/21 Yes Josetta Huddle, MD  venlafaxine XR (EFFEXOR-XR) 75 MG 24 hr capsule TAKE 1 CAPSULE BY MOUTH ONCE A DAY Patient taking differently: Take 75  mg by mouth daily. 08/10/20 08/10/21 Yes Josetta Huddle, MD  IncobotulinumtoxinA (XEOMIN IM) Inject 300 Units into the muscle every 3 (three) months. Patient not taking: No sig reported    [provider]    Allergies    Aspirin  Review of Systems   Review of Systems  Constitutional: Negative for chills and fever.  HENT: Negative.  Negative for congestion.   Eyes: Negative for visual disturbance.  Respiratory: Negative.  Negative for cough and shortness of breath.   Cardiovascular: Negative.  Negative for chest pain.  Gastrointestinal: Positive for nausea and vomiting.  Musculoskeletal: Positive for neck pain  (Chronic). Negative for back pain.  Skin: Negative.        Minor facial abrasions  Neurological: Positive for dizziness and headaches. Negative for syncope and speech difficulty.       Off balance with multiple falls  Psychiatric/Behavioral: Negative for confusion.    Physical Exam Updated Vital Signs BP (!) 176/96 (BP Location: Right Arm)   Pulse (!) 59   Temp 98 F (36.7 C) (Oral)   Resp 20   Ht 6' (1.829 m)   Wt 89.4 kg   SpO2 100%   BMI 26.72 kg/m   Physical Exam Vitals and nursing note reviewed.  Constitutional:      General: He is not in acute distress.    Appearance: He is not ill-appearing.  HENT:     Head: Normocephalic.     Comments: Superficial left sided facial abrasion    Nose: Nose normal.  Eyes:     Conjunctiva/sclera: Conjunctivae normal.  Neck:     Vascular: No carotid bruit.  Cardiovascular:     Rate and Rhythm: Normal rate and regular rhythm.     Heart sounds: No murmur heard.   Pulmonary:     Effort: Pulmonary effort is normal.     Breath sounds: Normal breath sounds. No wheezing, rhonchi or rales.  Chest:     Chest wall: No tenderness.  Abdominal:     General: There is no distension.     Palpations: Abdomen is soft.     Tenderness: There is no guarding or rebound.     Comments: Mild RUQ tenderness  Musculoskeletal:        General: Normal range of motion.     Cervical back: Normal range of motion and neck supple.  Skin:    General: Skin is warm and dry.  Neurological:     Mental Status: He is alert and oriented to person, place, and time.     GCS: GCS eye subscore is 4. GCS verbal subscore is 5. GCS motor subscore is 6.     Cranial Nerves: No cranial nerve deficit.     Sensory: Sensation is intact.     Motor: No weakness.     Coordination: Romberg sign positive. Finger-Nose-Finger Test normal.     Gait: Gait abnormal (falls to the left).     ED Results / Procedures / Treatments   Labs (all labs ordered are listed, but only  abnormal results are displayed) Labs Reviewed  CBC WITH DIFFERENTIAL/PLATELET  COMPREHENSIVE METABOLIC PANEL  URINALYSIS, ROUTINE W REFLEX MICROSCOPIC    EKG None  Radiology CT Head Wo Contrast  Result Date: 12/18/2020 CLINICAL DATA:  65 year old male struck by tree limb while working in yard. Subsequent nausea and vomiting. EXAM: CT HEAD WITHOUT CONTRAST TECHNIQUE: Contiguous axial images were obtained from the base of the skull through the vertex without intravenous contrast. COMPARISON:  Cervical  spine MRI 11/15/2004. FINDINGS: Brain: No midline shift, ventriculomegaly, mass effect, evidence of mass lesion, intracranial hemorrhage or evidence of cortically based acute infarction. Subtle asymmetric hypodensity in the central left cerebellum best seen on series 3, image 9. Elsewhere gray-white matter differentiation is within normal limits. No cerebral or cerebellar cortical encephalomalacia identified. Right side posterosuperior frontal gyrus congenital sulcal variation suspected (series 3, image 31). Vascular: Mild Calcified atherosclerosis at the skull base. No suspicious intracranial vascular hyperdensity. Skull: No fracture identified. Sinuses/Orbits: Small low-density fluid level plus circumferential mucosal thickening in the left maxillary sinus appears inflammatory. Trace sinus mucosal thickening elsewhere. Tympanic cavities are clear. Bilateral mastoids are well aerated. Other: Leftward gaze. Otherwise negative orbits soft tissues. No discrete scalp soft tissue injury identified. IMPRESSION: 1.  No acute traumatic injury identified. 2. Largely normal for age noncontrast CT appearance of the brain. But subtle asymmetric hypodensity in the central left cerebellum might be the sequelae of small vessel ischemia. 3. Mild paranasal sinus inflammation. Electronically Signed   By: Genevie Ann M.D.   On: 12/18/2020 04:22   CT Cervical Spine Wo Contrast  Result Date: 12/18/2020 CLINICAL DATA:   65 year old male struck by tree limb while working in yard. Subsequent nausea and vomiting. EXAM: CT CERVICAL SPINE WITHOUT CONTRAST TECHNIQUE: Multidetector CT imaging of the cervical spine was performed without intravenous contrast. Multiplanar CT image reconstructions were also generated. COMPARISON:  Cervical spine MRI 11/15/2004 and earlier. Head CT today reported separately. FINDINGS: Alignment: Stable cervical lordosis since 2006. Cervicothoracic junction alignment is within normal limits. Bilateral posterior element alignment is within normal limits. Skull base and vertebrae: Visualized skull base is intact. No atlanto-occipital dissociation. Normal C1-C2 alignment. No acute osseous abnormality identified. Soft tissues and spinal canal: No prevertebral fluid or swelling. No visible canal hematoma. Negative noncontrast neck soft tissues aside from carotid calcified atherosclerosis. Disc levels: Degenerative subchondral cyst of the odontoid process suspected. Chronic ankylosis C2 on C3 is new since 2006. Advanced cervical spine disc and endplate degeneration elsewhere, with multilevel mild lower cervical spinal stenosis suspected. Upper chest: Visible upper thoracic levels and lung apices appear negative. Other: None. IMPRESSION: 1. No acute traumatic injury identified in the cervical spine. 2. Ankylosis of C2-C3 since 2006. Advanced cervical disc and endplate degeneration elsewhere, with subsequent multilevel mild spinal stenosis suspected. Electronically Signed   By: Genevie Ann M.D.   On: 12/18/2020 04:26    Procedures Procedures   Medications Ordered in ED Medications  ondansetron (ZOFRAN-ODT) disintegrating tablet 4 mg (4 mg Oral Given 12/18/20 0355)    ED Course  I have reviewed the triage vital signs and the nursing notes.  Pertinent labs & imaging results that were available during my care of the patient were reviewed by me and considered in my medical decision making (see chart for  details).    MDM Rules/Calculators/A&P                          Patient to ED with ss/sxs as detailed in the HPI.   The patient is overall well appearing. Oriented, reports dizziness like the room is spinning.   CT head and neck ordered, reviewed and does not show any traumatic injury, "subtle asymmetric hypodensity in the central left cerebellum might be the sequelae of small vessel ischemia"  DDx - Vertigo vs stroke vs concussive syndrome  Lab and MRI brain studies pending.   Patient care signed out to oncoming provider team pending completion of  work up and appropriate disposition.   Final Clinical Impression(s) / ED Diagnoses Final diagnoses:  None   1. Dizziness 2. Nausea vomiting 3. Minor head injury  Rx / DC Orders ED Discharge Orders    None       Charlann Lange, Hershal Coria 12/18/20 4446    Ripley Fraise, MD 12/18/20 2325

## 2020-12-18 NOTE — ED Notes (Signed)
Provider at bedside at this time

## 2020-12-18 NOTE — ED Provider Notes (Signed)
MSE was initiated and I personally evaluated the patient and placed orders (if any) at  3:45 AM on December 18, 2020.  Head injury Tuesday after hitting a tree while on his riding mower. No LOC. Has been off balance with walking with multiple falls that started the next day, along with N, V. No anticoagulated. Blood pressure has been elevated.  Today's Vitals   12/18/20 0339 12/18/20 0341  BP:  (!) 163/99  Pulse:  (!) 58  Resp:  17  Temp:  98.3 F (36.8 C)  TempSrc:  Oral  SpO2:  100%  Weight: 89.4 kg   Height: 6' (1.829 m)   PainSc: 3     Body mass index is 26.72 kg/m.  Normal coordination Oriented Speech clear, focused  The patient appears stable so that the remainder of the MSE may be completed by another provider.   Charlann Lange, PA-C 12/18/20 4818    Ripley Fraise, MD 12/18/20 [phone]

## 2020-12-19 DIAGNOSIS — Z789 Other specified health status: Secondary | ICD-10-CM

## 2020-12-19 DIAGNOSIS — Z7289 Other problems related to lifestyle: Secondary | ICD-10-CM

## 2020-12-19 DIAGNOSIS — I639 Cerebral infarction, unspecified: Secondary | ICD-10-CM

## 2020-12-19 DIAGNOSIS — I10 Essential (primary) hypertension: Secondary | ICD-10-CM | POA: Diagnosis not present

## 2020-12-19 LAB — COMPREHENSIVE METABOLIC PANEL
ALT: 21 U/L (ref 0–44)
AST: 18 U/L (ref 15–41)
Albumin: 4 g/dL (ref 3.5–5.0)
Alkaline Phosphatase: 56 U/L (ref 38–126)
Anion gap: 12 (ref 5–15)
BUN: 7 mg/dL — ABNORMAL LOW (ref 8–23)
CO2: 24 mmol/L (ref 22–32)
Calcium: 9.5 mg/dL (ref 8.9–10.3)
Chloride: 96 mmol/L — ABNORMAL LOW (ref 98–111)
Creatinine, Ser: 0.67 mg/dL (ref 0.61–1.24)
GFR, Estimated: >60 mL/min (ref >60)
Glucose, Bld: 147 mg/dL — ABNORMAL HIGH (ref 70–99)
Potassium: 3.7 mmol/L (ref 3.5–5.1)
Sodium: 132 mmol/L — ABNORMAL LOW (ref 135–145)
Total Bilirubin: 0.8 mg/dL (ref 0.3–1.2)
Total Protein: 6.7 g/dL (ref 6.5–8.1)

## 2020-12-19 LAB — CBC
HCT: 39.6 % (ref 39.0–52.0)
Hemoglobin: 13.8 g/dL (ref 13.0–17.0)
MCH: 34.4 pg — ABNORMAL HIGH (ref 26.0–34.0)
MCHC: 34.8 g/dL (ref 30.0–36.0)
MCV: 98.8 fL (ref 80.0–100.0)
Platelets: 215 10*3/uL (ref 150–400)
RBC: 4.01 MIL/uL — ABNORMAL LOW (ref 4.22–5.81)
RDW: 12 % (ref 11.5–15.5)
WBC: 4.9 10*3/uL (ref 4.0–10.5)
nRBC: 0 % (ref 0.0–0.2)

## 2020-12-19 LAB — LIPID PANEL
Cholesterol: 233 mg/dL — ABNORMAL HIGH (ref 0–200)
HDL: 63 mg/dL (ref >40)
LDL Cholesterol: 137 mg/dL — ABNORMAL HIGH (ref 0–99)
Total CHOL/HDL Ratio: 3.7 RATIO
Triglycerides: 165 mg/dL — ABNORMAL HIGH (ref <150)
VLDL: 33 mg/dL (ref 0–40)

## 2020-12-19 MED ORDER — CILOSTAZOL 100 MG PO TABS
100.0000 mg | ORAL_TABLET | Freq: Two times a day (BID) | ORAL | Status: DC
  Administered 2020-12-19 – 2020-12-31 (×24): 100 mg via ORAL
  Filled 2020-12-19 (×26): qty 1

## 2020-12-19 NOTE — Progress Notes (Signed)
Physical Therapy Treatment Patient Details Name: Miguel [name] MRN: [id number] DOB: [birth year] Today's Date: 12/19/2020    History of Present Illness 65 yo male with onset of new L cerebellar and R thalamic infarcts was brought to ED, noted torticollis with no ins to get injection.  PMHx:  EtOH, pancreatitis, multi level spinal stenosis C-spine, ankylosed C2-C3, cervical disc endplate degeneration, HTN, atherosclerosis, torticollis    PT Comments    Patient progressing towards physical therapy goals. Patient requires min-modA+2 for OOB mobility with RW. Patient demos L lateral lean, ataxia, and increased balance deficits with ambulation. Patient able to correct with cueing, utilized mirror for visual feedback at sink to assist with finding midline. Patient highly motivated to return to PLOF. Continue to recommend comprehensive inpatient rehab (CIR) for post-acute therapy needs.     Follow Up Recommendations  CIR     Equipment Recommendations  None recommended by PT    Recommendations for Other Services       Precautions / Restrictions Precautions Precautions: Fall Restrictions Weight Bearing Restrictions: No    Mobility  Bed Mobility Overal bed mobility: Needs Assistance Bed Mobility: Supine to Sit     Supine to sit: Supervision     General bed mobility comments: supervision for safety    Transfers Overall transfer level: Needs assistance Equipment used: Rolling Pace [place] (2 wheeled);2 person hand held assist Transfers: Sit to/from Stand;Stand Pivot Transfers Sit to Stand: Min assist;+2 physical assistance;+2 safety/equipment Stand pivot transfers: Min assist;Mod assist;+2 physical assistance;+2 safety/equipment       General transfer comment: minA+2 for sit to stand with cues for hand placement. Stand pivot bed>recliner at end of session requiring min-modA+2. Prior to stand pivot, patient asked therapist to step away and educated patient on need for assistance with  transfer. Performed sit to stand x 5 from low bed surface, cues required for maintaining midline and finding target to visualize to assist. Noted B knees locked in extension prior to trunk extension and bracing B LEs on bed to balance.  Ambulation/Gait Ambulation/Gait assistance: Min assist;Mod assist;+2 physical assistance;+2 safety/equipment Gait Distance (Feet): 10 Feet (x10') Assistive device: 2 person hand held assist;Rolling Lad[place]ion [place] (2 wheeled) Gait Pattern/deviations: Step-to pattern;Decreased stride length;Wide base of support;Ataxic;Staggering left Gait velocity: decreased   General Gait Details: Initially with HHAx2 but required minA+2 at best. L lateral lean with manual assistance to correct at times. Patient able to self correct with cueing. Trialed use of RW for tactile feedback and support with improved ability to maintain midline. ModA+2 when told patient to "not think, just walk", patient exhibited increased gait speed, ataxia, and increased L lateral lean.   Stairs             Wheelchair Mobility    Modified Rankin (Stroke Patients Only) Modified Rankin (Stroke Patients Only) Pre-Morbid Rankin Score: No symptoms Modified Rankin: Moderately severe disability     Balance Overall balance assessment: Needs assistance Sitting-balance support: Feet supported Sitting balance-Leahy Scale: Fair Sitting balance - Comments: close supervision for sitting EOB Postural control: Left lateral lean Standing balance support: Bilateral upper extremity supported;During functional activity Standing balance-Leahy Scale: Poor Standing balance comment: heavy L lateral lean with ambulation requiring min-modA+2. Able to stand statically at sink with B UE support and use mirror for visual feedback to assist with finding midline                            Cognition Arousal/Alertness: Awake/alert Behavior During Therapy:  Impulsive Overall Cognitive Status: Impaired/Different  from baseline Area of Impairment: Safety/judgement;Awareness;Problem solving                         Safety/Judgement: Decreased awareness of safety Awareness: Emergent Problem Solving: Slow processing;Difficulty sequencing;Requires verbal cues General Comments: Impulsive at times. Decreased awareness of safety with patient asking therapist to step away for stand pivot transfer after requiring min-modA+2 for ambulation 5-10 minutes prior.      Exercises      General Comments        Pertinent Vitals/Pain Pain Assessment: No/denies pain    Home Living Family/patient expects to be discharged to:: Private residence Living Arrangements: Spouse/significant other Available Help at Discharge: Family Type of Home: House Home Access: Stairs to enter   Home Layout: Two level;1/2 bath on main level;Bed/bath upstairs Home Equipment: None      Prior Function Level of Independence: Independent      Comments: independent ADLs, IADLs, driving; recently lost his job 3 weeks ago   PT Goals (current goals can now be found in the care plan section) Acute Rehab PT Goals Patient Stated Goal: to get stronger and be able to walk PT Goal Formulation: With patient/family Time For Goal Achievement: 01/01/21 Potential to Achieve Goals: Good Progress towards PT goals: Progressing toward goals    Frequency    Min 4X/week      PT Plan Current plan remains appropriate    Co-evaluation PT/OT/SLP Co-Evaluation/Treatment: Yes Reason for Co-Treatment: For patient/therapist safety;To address functional/ADL transfers PT goals addressed during session: Mobility/safety with mobility;Balance;Proper use of DME;Strengthening/ROM        AM-PAC PT "6 Clicks" Mobility   Outcome Measure  Help needed turning from your back to your side while in a flat bed without using bedrails?: A Little Help needed moving from lying on your back to sitting on the side of a flat bed without using  bedrails?: A Little Help needed moving to and from a bed to a chair (including a wheelchair)?: A Lot Help needed standing up from a chair using your arms (e.g., wheelchair or bedside chair)?: A Lot Help needed to walk in hospital room?: A Lot Help needed climbing 3-5 steps with a railing? : Total 6 Click Score: 13    End of Session Equipment Utilized During Treatment: Gait belt Activity Tolerance: Patient tolerated treatment well Patient left: in chair;with call bell/phone within reach;with chair alarm set;with family/visitor present Nurse Communication: Mobility status PT Visit Diagnosis: Unsteadiness on feet (R26.81);Ataxic gait (R26.0);Difficulty in walking, not elsewhere classified (R26.2)     Time: 1352-1430 PT Time Calculation (min) (ACUTE ONLY): 38 min  Charges:  $Gait Training: 23-37 mins                     Miguel [name] PT, DPT Acute Rehabilitation Services Pager [phone] Office [phone]    Linna Hoff 12/19/2020, 3:24 PM

## 2020-12-19 NOTE — Progress Notes (Signed)
Inpatient Rehab Admissions Coordinator:   Met with Pt.'s wife at bedside to discuss potential CIR admission (Pt. Was sleeping and she asked that I not wake him). She stated interest, reports that she is working on getting Pt.'s insurance re-instated through COBRA (Pt. Lost his job 3 months ago and is currently uninsured). She states that she would like to use Pt.'s BCBS policy for CIR once re-instated. I assisted her with faxing documents to [facility].  Pt.'s wife also states that she will be taking FMLA to care for Pt. And will begin paperwork Monday. Will pursue for potential admit next week, pending bed availability and insurance auth, and medical readiness.   Clemens Catholic, [place], [place] Admissions Coordinator  [phone] ([place]) [phone] (office)

## 2020-12-19 NOTE — Progress Notes (Signed)
PROGRESS NOTE    Miguel [name]  IHK:[id number] DOB: [birth year] DOA: 12/18/2020 PCP: Josetta Huddle, MD    Brief Narrative:  65 year old with history of hypertension, depression, dyspepsia and anxiety with cervical dystonia, and ongoing alcohol use who presents with several day history of nausea, vomiting, dizziness and a tendency to veer to his left when he is trying to walk.  He previously had hit a tree branch while riding his lawnmower. He usually has Botox injections for his cervical dystonia but has not had one in approximately 9 months.  He did lose his job and his insurance recently.  He reports ongoing alcohol and marijuana use to treat his pain and relax his neck muscles. In the ED he is noted to have a cerebellar stroke with a left distal vertebral artery occlusion.   Assessment & Plan:   Principal Problem:   Acute CVA (cerebrovascular accident) ([place]) Active Problems:   Isolated cervical dystonia   Depression with anxiety   Essential hypertension   Alcohol use    Acute CVA (cerebrovascular accident) ([place])             [place] [facility]             Start Plavix 75 mg daily             Unfortunately he has anaphylaxis to aspirin -Pletal 100 mg twice daily, take both for 3 weeks then transition to Plavix alone.             Start Lipitor 80 mg daily -LDL 137             Check A1c  Carotid Dopplers are essentially normal  Echo pending             Consult PT/OT -recommending CIR, order has been placed             General diet              Zofran for nausea             Stenosis noted on CTA of the distal left vertebral artery             Cardiac monitoring    Depression with anxiety             Continue Wellbutrin 150 mg daily             Continue Effexor XR 75 mg daily             Continue Klonopin 1 mg daily    Essential hypertension             Restart valsartan 80 mg daily  Cervical dystonia   Alcohol use  CIWA protocol   DVT prophylaxis: Lovenox SQ Code  Status: Full code  Family Communication: Wife at bedside Disposition Plan: CIR Patient remains inpatient for ongoing work-up, unsafe discharge plan   Consultants:   Neurology  Procedures:  None  Antimicrobials: Anti-infectives (From admission, onward)   None       Subjective: Feels better today, just tired.  Not sleeping well in the hospital  Objective: Vitals:   12/19/20 0019 12/19/20 0429 12/19/20 0757 12/19/20 1200  BP: (!) 170/96 (!) 171/114 (!) 169/101 137/82  Pulse: 63 67 66 71  Resp: 18 16 18 18   Temp: 98 F (36.7 C) 98 F (36.7 C) 98 F (36.7 C) 98.1 F (36.7 C)  TempSrc: Oral Oral Oral Oral  SpO2: 98% 97% 98% 93%  Weight:  Height:        Intake/Output Summary (Last 24 hours) at 12/19/2020 1511 Last data filed at 12/19/2020 0757 Gross per 24 hour  Intake --  Output 1400 ml  Net -1400 ml   Filed Weights   12/18/20 0339  Weight: 89.4 kg    Examination:  General exam: Appears calm and comfortable  Respiratory system: Clear to auscultation. Respiratory effort normal. Cardiovascular system: S1 & S2 heard, RRR.  Gastrointestinal system: Abdomen is nondistended, soft and nontender.  Central nervous system: Alert and oriented. No focal neurological deficits. Extremities: Symmetric  Skin: No rashes Psychiatry: Judgement and insight appear normal. Mood & affect appropriate.     Data Reviewed: I have personally reviewed following labs and imaging studies  CBC: Recent Labs  Lab 12/18/20 0852 12/18/20 1136 12/19/20 0224  WBC 4.5 4.5 4.9  NEUTROABS 3.1  --   --   HGB 14.2 13.6 13.8  HCT 40.6 39.2 39.6  MCV 99.5 98.7 98.8  PLT 210 198 284   Basic Metabolic Panel: Recent Labs  Lab 12/18/20 0559 12/18/20 1136 12/19/20 0224  NA 134*  --  132*  K 4.0  --  3.7  CL 99  --  96*  CO2 25  --  24  GLUCOSE 165*  --  147*  BUN 9  --  7*  CREATININE 0.69 0.71 0.67  CALCIUM 9.5  --  9.5  MG  --  1.8  --   PHOS  --  2.6  --     GFR: Estimated Creatinine Clearance: 102.4 mL/min (by C-G formula based on SCr of 0.67 mg/dL). Liver Function Tests: Recent Labs  Lab 12/18/20 0559 12/19/20 0224  AST 22 18  ALT 25 21  ALKPHOS 59 56  BILITOT 0.7 0.8  PROT 6.9 6.7  ALBUMIN 4.2 4.0   Lipid Profile: Recent Labs    12/19/20 0224  CHOL 233*  HDL 63  LDLCALC 137*  TRIG 165*  CHOLHDL 3.7     Recent Results (from the past 240 hour(s))  SARS CORONAVIRUS 2 (TAT 6-24 HRS) Nasopharyngeal Nasopharyngeal Swab     Status: None   Collection Time: 12/18/20  8:32 AM   Specimen: Nasopharyngeal Swab  Result Value Ref Range Status   SARS Coronavirus 2 NEGATIVE NEGATIVE Final    Comment: (NOTE) SARS-CoV-2 target nucleic acids are NOT DETECTED.  The SARS-CoV-2 RNA is generally detectable in upper and lower respiratory specimens during the acute phase of infection. Negative results do not preclude SARS-CoV-2 infection, do not rule out co-infections with other pathogens, and should not be used as the sole basis for treatment or other patient management decisions. Negative results must be combined with clinical observations, patient history, and epidemiological information. The expected result is Negative.  Fact Sheet for Patients: SugarRoll.be  Fact Sheet for Healthcare Providers: https://www.woods-mathews.com/  This test is not yet approved or cleared by the [place] FDA and  has been authorized for detection and/or diagnosis of SARS-CoV-2 by FDA under an Emergency Use Authorization (EUA). This EUA will remain  in effect (meaning this test can be used) for the duration of the COVID-19 declaration under Se ction 564(b)(1) of the Act, 21 U.S.C. section 360bbb-3(b)(1), unless the authorization is terminated or revoked sooner.  Performed at [facility], [place] [address], [place], [place] [address]       Radiology Studies: CT ANGIO HEAD NECK W WO CM  Result  Date: 12/18/2020 CLINICAL DATA:  Stroke EXAM: CT ANGIOGRAPHY HEAD  AND NECK TECHNIQUE: Multidetector CT imaging of the head and neck was performed using the standard protocol during bolus administration of intravenous contrast. Multiplanar CT image reconstructions and MIPs were obtained to evaluate the vascular anatomy. Carotid stenosis measurements (when applicable) are obtained utilizing NASCET criteria, using the distal internal carotid diameter as the denominator. CONTRAST:  66mL OMNIPAQUE IOHEXOL 350 MG/ML SOLN COMPARISON:  MRI head 12/18/2020.  CT head 12/18/2020 FINDINGS: CTA NECK FINDINGS Aortic arch: Standard branching. Imaged portion shows no evidence of aneurysm or dissection. No significant stenosis of the major arch vessel origins. Right carotid system: Atherosclerotic plaque right carotid bifurcation with calcified and noncalcified plaque. 50% diameter stenosis proximal right internal carotid artery. Left carotid system: Atherosclerotic disease left carotid bifurcation which is predominately calcified. No significant stenosis. Vertebral arteries: Right vertebral dominant and patent to the basilar without stenosis. Moderate to severe stenosis at the origin of the left vertebral artery. Small left vertebral artery with fading opacification distally and occlusion at the C1 level. Skeleton: Cervical spondylosis.  No acute skeletal abnormality. Other neck: Negative for mass or adenopathy. Upper chest: Lung apices clear bilaterally. Review of the MIP images confirms the above findings CTA HEAD FINDINGS Anterior circulation: Cavernous carotid widely patent bilaterally. Anterior and middle cerebral arteries patent bilaterally without stenosis or large vessel occlusion. Posterior circulation: Right vertebral artery widely patent and supplies the basilar. Right PICA patent. Distal left vertebral artery occluded. Possible faint retrograde flow in the left V4 segment. Left PICA not visualized. Basilar widely  patent. AICA patent bilaterally. Superior cerebellar and posterior cerebral arteries patent bilaterally. Venous sinuses: Normal venous enhancement. Anatomic variants: None Review of the MIP images confirms the above findings IMPRESSION: 1. Moderate to severe stenosis origin of left vertebral artery. Occlusion distal left vertebral artery at C1 which appears acute. Left PICA not visualized. Patient has acute left PICA infarct on MRI. 2. Atherosclerotic disease in the carotid bifurcation bilaterally. 50% diameter stenosis proximal right internal carotid artery. No significant left carotid stenosis. Electronically Signed   By: Franchot Gallo M.D.   On: 12/18/2020 10:29   DG Ribs Unilateral W/Chest Right  Result Date: 12/18/2020 CLINICAL DATA:  Fall, right rib pain EXAM: RIGHT RIBS AND CHEST - 3+ VIEW COMPARISON:  None. FINDINGS: No fracture or other bone lesions are seen involving the ribs. There is no evidence of pneumothorax or pleural effusion. Both lungs are clear. Heart size and mediastinal contours are within normal limits. IMPRESSION: Negative. Electronically Signed   By: Fidela Salisbury MD   On: 12/18/2020 07:13   CT Head Wo Contrast  Result Date: 12/18/2020 CLINICAL DATA:  65 year old male struck by tree limb while working in yard. Subsequent nausea and vomiting. EXAM: CT HEAD WITHOUT CONTRAST TECHNIQUE: Contiguous axial images were obtained from the base of the skull through the vertex without intravenous contrast. COMPARISON:  Cervical spine MRI 11/15/2004. FINDINGS: Brain: No midline shift, ventriculomegaly, mass effect, evidence of mass lesion, intracranial hemorrhage or evidence of cortically based acute infarction. Subtle asymmetric hypodensity in the central left cerebellum best seen on series 3, image 9. Elsewhere gray-white matter differentiation is within normal limits. No cerebral or cerebellar cortical encephalomalacia identified. Right side posterosuperior frontal gyrus congenital sulcal  variation suspected (series 3, image 31). Vascular: Mild Calcified atherosclerosis at the skull base. No suspicious intracranial vascular hyperdensity. Skull: No fracture identified. Sinuses/Orbits: Small low-density fluid level plus circumferential mucosal thickening in the left maxillary sinus appears inflammatory. Trace sinus mucosal thickening elsewhere. Tympanic cavities are clear. Bilateral  mastoids are well aerated. Other: Leftward gaze. Otherwise negative orbits soft tissues. No discrete scalp soft tissue injury identified. IMPRESSION: 1.  No acute traumatic injury identified. 2. Largely normal for age noncontrast CT appearance of the brain. But subtle asymmetric hypodensity in the central left cerebellum might be the sequelae of small vessel ischemia. 3. Mild paranasal sinus inflammation. Electronically Signed   By: Genevie Ann M.D.   On: 12/18/2020 04:22   CT Cervical Spine Wo Contrast  Result Date: 12/18/2020 CLINICAL DATA:  65 year old male struck by tree limb while working in yard. Subsequent nausea and vomiting. EXAM: CT CERVICAL SPINE WITHOUT CONTRAST TECHNIQUE: Multidetector CT imaging of the cervical spine was performed without intravenous contrast. Multiplanar CT image reconstructions were also generated. COMPARISON:  Cervical spine MRI 11/15/2004 and earlier. Head CT today reported separately. FINDINGS: Alignment: Stable cervical lordosis since 2006. Cervicothoracic junction alignment is within normal limits. Bilateral posterior element alignment is within normal limits. Skull base and vertebrae: Visualized skull base is intact. No atlanto-occipital dissociation. Normal C1-C2 alignment. No acute osseous abnormality identified. Soft tissues and spinal canal: No prevertebral fluid or swelling. No visible canal hematoma. Negative noncontrast neck soft tissues aside from carotid calcified atherosclerosis. Disc levels: Degenerative subchondral cyst of the odontoid process suspected. Chronic ankylosis  C2 on C3 is new since 2006. Advanced cervical spine disc and endplate degeneration elsewhere, with multilevel mild lower cervical spinal stenosis suspected. Upper chest: Visible upper thoracic levels and lung apices appear negative. Other: None. IMPRESSION: 1. No acute traumatic injury identified in the cervical spine. 2. Ankylosis of C2-C3 since 2006. Advanced cervical disc and endplate degeneration elsewhere, with subsequent multilevel mild spinal stenosis suspected. Electronically Signed   By: Genevie Ann M.D.   On: 12/18/2020 04:26   MR BRAIN WO CONTRAST  Addendum Date: 12/18/2020   ADDENDUM REPORT: 12/18/2020 08:38 ADDENDUM: Note is made of marked atrophy of the posterior paraspinous muscles in the upper neck, right greater than left. This was not present on the prior cervical MRI of 11/15/2004. Electronically Signed   By: Franchot Gallo M.D.   On: 12/18/2020 08:38   Result Date: 12/18/2020 CLINICAL DATA:  Dizziness. EXAM: MRI HEAD WITHOUT CONTRAST TECHNIQUE: Multiplanar, multiecho pulse sequences of the brain and surrounding structures were obtained without intravenous contrast. COMPARISON:  CT head 12/18/2020 FINDINGS: Brain: Scattered small areas of acute infarct in the left posterior and inferior cerebellum corresponding to CT hypodensity. Additional small infarct in the right thalamus. Generalized atrophy. Mild white matter changes consistent with chronic microvascular ischemia. Negative for hemorrhage or mass. Vascular: Abnormal flow related signal distal left vertebral artery which may be stenotic or occluded. Otherwise normal arterial flow voids at the base of brain. Skull and upper cervical spine: No focal abnormality. Sinuses/Orbits: Mucosal edema paranasal sinuses.  Negative orbit Other: None IMPRESSION: Acute infarct left inferior and posterior cerebellum. Small acute infarct right thalamus. Electronically Signed: By: Franchot Gallo M.D. On: 12/18/2020 08:25   VAS US CAROTID (at [facility]  only)  Result Date: 12/19/2020 Carotid Arterial Duplex Study Indications:       CVA. Risk Factors:      Hypertension, past history of smoking. Limitations        Today's exam was limited due to the body habitus of the                    patient. Comparison Study:  12-18-2020 CTA head and neck showed 50% diameter stenosis of  the proximal RT ICA. Moderate to severe stenosis at the                    origin of the LT vertebral. Performing Technologist: Darlin Coco RDMS,RVT  Examination Guidelines: A complete evaluation includes B-mode imaging, spectral Doppler, color Doppler, and power Doppler as needed of all accessible portions of each vessel. Bilateral testing is considered an integral part of a complete examination. Limited examinations for reoccurring indications may be performed as noted.  Right Carotid Findings: +----------+--------+--------+--------+---------------------------+--------+           PSV cm/sEDV cm/sStenosisPlaque Description         Comments +----------+--------+--------+--------+---------------------------+--------+ CCA Prox  88      21                                                  +----------+--------+--------+--------+---------------------------+--------+ CCA Distal119     40                                                  +----------+--------+--------+--------+---------------------------+--------+ ICA Prox  89      30      1-39%   hypoechoic and heterogenous         +----------+--------+--------+--------+---------------------------+--------+ ICA Distal65      24                                                  +----------+--------+--------+--------+---------------------------+--------+ ECA       129     38                                                  +----------+--------+--------+--------+---------------------------+--------+ +----------+--------+-------+----------------+-------------------+           PSV cm/sEDV  cmsDescribe        Arm Pressure (mmHG) +----------+--------+-------+----------------+-------------------+ [id number]            Multiphasic, WNL                    +----------+--------+-------+----------------+-------------------+ +---------+--------+--+--------+--+---------+ VertebralPSV cm/s51EDV cm/s18Antegrade +---------+--------+--+--------+--+---------+  Left Carotid Findings: +----------+--------+--------+--------+------------------+--------+           PSV cm/sEDV cm/sStenosisPlaque DescriptionComments +----------+--------+--------+--------+------------------+--------+ CCA Prox  108     25                                         +----------+--------+--------+--------+------------------+--------+ CCA Mid   99      27                                         +----------+--------+--------+--------+------------------+--------+ CCA Distal67      20                                         +----------+--------+--------+--------+------------------+--------+  ICA Prox  65      14      1-39%                              +----------+--------+--------+--------+------------------+--------+ ICA Distal72      28                                         +----------+--------+--------+--------+------------------+--------+ ECA       125     31                                         +----------+--------+--------+--------+------------------+--------+ +----------+--------+--------+----------------+-------------------+           PSV cm/sEDV cm/sDescribe        Arm Pressure (mmHG) +----------+--------+--------+----------------+-------------------+ [id number]             Multiphasic, WNL                    +----------+--------+--------+----------------+-------------------+ +---------+--------+--+--------+------------------+ VertebralPSV cm/s26EDV cm/sStenotic at origin +---------+--------+--+--------+------------------+   Summary: Right Carotid:  Velocities in the right ICA are consistent with a 1-39% stenosis. Left Carotid: Velocities in the left ICA are consistent with a 1-39% stenosis. Vertebrals:  Right vertebral artery demonstrates antegrade flow. Subclavians: Normal flow hemodynamics were seen in bilateral subclavian              arteries. *See table(s) above for measurements and observations.  Electronically signed by Servando Snare MD on 12/19/2020 at 9:39:18 AM.    Final      Scheduled Meds: . atorvastatin  80 mg Oral Daily  . buPROPion  150 mg Oral Daily  . cilostazol  100 mg Oral BID  . clonazePAM  1 mg Oral Daily  . clopidogrel  75 mg Oral Daily  . enoxaparin (LOVENOX) injection  40 mg Subcutaneous Daily  . irbesartan  75 mg Oral Daily  . multivitamin with minerals  1 tablet Oral Daily  . venlafaxine XR  75 mg Oral Daily   Continuous Infusions:   LOS: 1 day    Donnamae Jude, MD 12/19/2020 3:11 PM [phone] [facility] If 7PM-7AM, please contact night-coverage 12/19/2020, 3:11 PM
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
## 2020-12-19 NOTE — Evaluation (Signed)
Occupational Therapy Evaluation Patient Details Name: Miguel [name] MRN: [id number] DOB: [birth year] Today's Date: 12/19/2020    History of Present Illness 65 yo male with onset of new L cerebellar and R thalamic infarcts was brought to ED, noted torticollis with no ins to get injection.  PMHx:  EtOH, pancreatitis, multi level spinal stenosis C-spine, ankylosed C2-C3, cervical disc endplate degeneration, HTN, atherosclerosis, torticollis   Clinical Impression   PTA patient independent. Admitted for above and presenting with impaired balance (heavy L lateral lean), decreased activity tolerance, dizziness with standing activities, mild L UE dysmetria, and decreased safety awareness.  He requires up to mod assist +2 for transfers, min guard for UB ADLs seated, but +2 for standing ADLs for LB (min assist +2).  He has good awareness of his deficits, but requires multimodal cueing to correct when completing functional task.  He will benefit from continued OT services while admitted and after discharge at [facility] level to optimize independence and safety with ADLs, mobility prior to dc home.     Follow Up Recommendations  CIR    Equipment Recommendations  3 in 1 bedside commode    Recommendations for Other Services       Precautions / Restrictions Precautions Precautions: Fall Restrictions Weight Bearing Restrictions: No      Mobility Bed Mobility Overal bed mobility: Needs Assistance Bed Mobility: Supine to Sit     Supine to sit: Supervision     General bed mobility comments: supervision for safety    Transfers Overall transfer level: Needs assistance Equipment used: Rolling walker (2 wheeled);2 person hand held assist Transfers: Sit to/from Stand;Stand Pivot Transfers Sit to Stand: Min assist;+2 physical assistance;+2 safety/equipment Stand pivot transfers: Min assist;Mod assist;+2 physical assistance;+2 safety/equipment       General transfer comment: min assist +2 for sit to  stand with cueing for hand placement, pivot to recliner towards L side with mod assist +2 using RW.  Requires cueing for midline positioning, using target to visualize midline improves signifincantly.    Balance Overall balance assessment: Needs assistance Sitting-balance support: Feet supported Sitting balance-Leahy Scale: Fair Sitting balance - Comments: close supervision for sitting EOB Postural control: Left lateral lean Standing balance support: Bilateral upper extremity supported;During functional activity Standing balance-Leahy Scale: Poor Standing balance comment: heavy L lateral lean with ambulation requiring min-modA+2. Able to stand statically at sink with B UE support and use mirror for visual feedback to assist with finding midline                           ADL either performed or assessed with clinical judgement   ADL Overall ADL's : Needs assistance/impaired     Grooming: Wash/dry hands;Oral care;Minimal assistance;Standing   Upper Body Bathing: Set up;Sitting   Lower Body Bathing: Minimal assistance;Sit to/from stand;+2 for safety/equipment;+2 for physical assistance   Upper Body Dressing : Min guard;Sitting   Lower Body Dressing: Minimal assistance;+2 for physical assistance;+2 for safety/equipment;Sit to/from stand   Toilet Transfer: Minimal assistance;Ambulation;RW Toilet Transfer Details (indicate cue type and reason): simulated in room         Functional mobility during ADLs: Minimal assistance;Moderate assistance;+2 for physical assistance;+2 for safety/equipment;Rolling walker;Cueing for safety;Cueing for sequencing General ADL Comments: pt limited by impaired balance, decreased safety, dizziness     Vision Baseline Vision/History: Wears glasses (contacts) Wears Glasses: At all times Patient Visual Report: No change from baseline Vision Assessment?: Yes Eye Alignment: Within Functional Limits Ocular Range of  Motion: Within Functional  Limits Alignment/Gaze Preference: Within Defined Limits Tracking/Visual Pursuits: Able to track stimulus in all quads without difficulty     Perception     Praxis      Pertinent Vitals/Pain Pain Assessment: No/denies pain     Hand Dominance Right   Extremity/Trunk Assessment Upper Extremity Assessment Upper Extremity Assessment: LUE deficits/detail LUE Deficits / Details: mild dysmetria but improved with repetition LUE Coordination: decreased gross motor   Lower Extremity Assessment Lower Extremity Assessment: Defer to PT evaluation   Cervical / Trunk Assessment Cervical / Trunk Assessment: Normal   Communication Communication Communication: No difficulties   Cognition Arousal/Alertness: Awake/alert Behavior During Therapy: Impulsive Overall Cognitive Status: Impaired/Different from baseline Area of Impairment: Safety/judgement;Awareness;Problem solving                         Safety/Judgement: Decreased awareness of safety Awareness: Emergent Problem Solving: Slow processing;Difficulty sequencing;Requires verbal cues General Comments: Impulsive at times. Aware of deficits, but requires cueing for safety and mulitmodal cueing to maintain midline.   General Comments  spouse present and supportive    Exercises     Shoulder Instructions      Home Living Family/patient expects to be discharged to:: Private residence Living Arrangements: Spouse/significant other Available Help at Discharge: Family Type of Home: House Home Access: Stairs to enter Technical brewer of Steps: 1   Home Layout: Two level;1/2 bath on main level;Bed/bath upstairs     Bathroom Shower/Tub: Occupational psychologist: Standard Bathroom Accessibility: Yes How Accessible: Accessible via walker Home Equipment: None      Lives With: Spouse    Prior Functioning/Environment Level of Independence: Independent        Comments: independent ADLs, IADLs, driving;  recently lost his job 3 weeks ago        OT Problem List: Decreased activity tolerance;Impaired balance (sitting and/or standing);Decreased safety awareness;Decreased coordination;Decreased knowledge of use of DME or AE;Decreased knowledge of precautions      OT Treatment/Interventions: Self-care/ADL training;Neuromuscular education;DME and/or AE instruction;Therapeutic activities;Patient/family education;Balance training;Cognitive remediation/compensation    OT Goals(Current goals can be found in the care plan section) Acute Rehab OT Goals Patient Stated Goal: to get stronger and be able to walk OT Goal Formulation: With patient Time For Goal Achievement: 01/02/21 Potential to Achieve Goals: Good  OT Frequency: Min 3X/week   Barriers to D/C:            Co-evaluation PT/OT/SLP Co-Evaluation/Treatment: Yes Reason for Co-Treatment: For patient/therapist safety;To address functional/ADL transfers PT goals addressed during session: Mobility/safety with mobility;Balance;Proper use of DME;Strengthening/ROM OT goals addressed during session: ADL's and self-care      AM-PAC OT "6 Clicks" Daily Activity     Outcome Measure Help from another person eating meals?: None Help from another person taking care of personal grooming?: A Little Help from another person toileting, which includes using toliet, bedpan, or urinal?: A Lot Help from another person bathing (including washing, rinsing, drying)?: A Lot Help from another person to put on and taking off regular upper body clothing?: A Little Help from another person to put on and taking off regular lower body clothing?: A Lot 6 Click Score: 16   End of Session Equipment Utilized During Treatment: Gait belt;Rolling walker Nurse Communication: Mobility status  Activity Tolerance: Patient tolerated treatment well Patient left: in chair;with call bell/phone within reach;with chair alarm set;with family/visitor present  OT Visit Diagnosis:  Other abnormalities of gait and mobility (R26.89);History  of falling (Z91.81);Dizziness and giddiness (R42)                Time: 2992-4268 OT Time Calculation (min): 37 min Charges:  OT General Charges $OT Visit: 1 Visit OT Evaluation $OT Eval Moderate Complexity: 1 Mod  Jolaine Artist, OT Acute Rehabilitation Services Pager [phone] Office [phone]   Delight Stare 12/19/2020, 3:44 PM

## 2020-12-19 NOTE — Progress Notes (Addendum)
STROKE TEAM PROGRESS NOTE   SUBJECTIVE (INTERVAL HISTORY) His wife is at the bedside.  Mr Rhylen Shaheen is lying in bed with his eyes closed. He awakens easily and answers questions appropriately. He is oriented x4. I informed him and his wife that we are going to start him on cilostazol 100mg  BID for [redacted] weeks along with the Plavix.  Echo results are still pending. All questions answered   OBJECTIVE Vitals:   12/18/20 1957 12/19/20 0019 12/19/20 0429 12/19/20 0757  BP: (!) 132/113 (!) 170/96 (!) 171/114 (!) 169/101  Pulse: 67 63 67 66  Resp: 19 18 16 18   Temp: 98.2 F (36.8 C) 98 F (36.7 C) 98 F (36.7 C) 98 F (36.7 C)  TempSrc: Oral Oral Oral Oral  SpO2: 97% 98% 97% 98%  Weight:      Height:        CBC:  Recent Labs  Lab 12/18/20 0852 12/18/20 1136 12/19/20 0224  WBC 4.5 4.5 4.9  NEUTROABS 3.1  --   --   HGB 14.2 13.6 13.8  HCT 40.6 39.2 39.6  MCV 99.5 98.7 98.8  PLT 210 198 196    Basic Metabolic Panel:  Recent Labs  Lab 12/18/20 0559 12/18/20 1136 12/19/20 0224  NA 134*  --  132*  K 4.0  --  3.7  CL 99  --  96*  CO2 25  --  24  GLUCOSE 165*  --  147*  BUN 9  --  7*  CREATININE 0.69 0.71 0.67  CALCIUM 9.5  --  9.5  MG  --  1.8  --   PHOS  --  2.6  --     Lipid Panel:  Recent Labs  Lab 12/19/20 0224  CHOL 233*  TRIG 165*  HDL 63  CHOLHDL 3.7  VLDL 33  LDLCALC 137*   HgbA1c: No results found for: HGBA1C Urine Drug Screen:     Component Value Date/Time   LABOPIA NONE DETECTED 12/18/2020 0703   COCAINSCRNUR NONE DETECTED 12/18/2020 0703   LABBENZ NONE DETECTED 12/18/2020 0703   AMPHETMU NONE DETECTED 12/18/2020 0703   THCU POSITIVE (A) 12/18/2020 0703   LABBARB NONE DETECTED 12/18/2020 0703    Alcohol Level No results found for: [facility]  IMAGING  Results for orders placed or performed during the hospital encounter of 12/18/20  MR BRAIN WO CONTRAST   Addendum: 12/18/2020   ADDENDUM REPORT: 12/18/2020 08:38  ADDENDUM: Note is made of marked  atrophy of the posterior paraspinous muscles in the upper neck, right greater than left. This was not present on the prior cervical MRI of 11/15/2004.   Electronically Signed   By: Franchot Gallo M.D.   On: 12/18/2020 08:38     Narrative   CLINICAL DATA:  Dizziness.  EXAM: MRI HEAD WITHOUT CONTRAST  TECHNIQUE: Multiplanar, multiecho pulse sequences of the brain and surrounding structures were obtained without intravenous contrast.  COMPARISON:  CT head 12/18/2020  FINDINGS: Brain: Scattered small areas of acute infarct in the left posterior and inferior cerebellum corresponding to CT hypodensity. Additional small infarct in the right thalamus.  Generalized atrophy. Mild white matter changes consistent with chronic microvascular ischemia. Negative for hemorrhage or mass.  Vascular: Abnormal flow related signal distal left vertebral artery which may be stenotic or occluded. Otherwise normal arterial flow voids at the base of brain.  Skull and upper cervical spine: No focal abnormality.  Sinuses/Orbits: Mucosal edema paranasal sinuses.  Negative orbit  Other: None  IMPRESSION: Acute  infarct left inferior and posterior cerebellum. Small acute infarct right thalamus.  Electronically Signed: By: Franchot Gallo M.D. On: 12/18/2020 08:25   CT Head Wo Contrast   Narrative   CLINICAL DATA:  65 year old male struck by tree limb while working in yard. Subsequent nausea and vomiting.  EXAM: CT HEAD WITHOUT CONTRAST  TECHNIQUE: Contiguous axial images were obtained from the base of the skull through the vertex without intravenous contrast.  COMPARISON:  Cervical spine MRI 11/15/2004.  FINDINGS: Brain: No midline shift, ventriculomegaly, mass effect, evidence of mass lesion, intracranial hemorrhage or evidence of cortically based acute infarction. Subtle asymmetric hypodensity in the central left cerebellum best seen on series 3, image 9. Elsewhere  gray-white matter differentiation is within normal limits. No cerebral or cerebellar cortical encephalomalacia identified. Right side posterosuperior frontal gyrus congenital sulcal variation suspected (series 3, image 31).  Vascular: Mild Calcified atherosclerosis at the skull base. No suspicious intracranial vascular hyperdensity.  Skull: No fracture identified.  Sinuses/Orbits: Small low-density fluid level plus circumferential mucosal thickening in the left maxillary sinus appears inflammatory. Trace sinus mucosal thickening elsewhere. Tympanic cavities are clear. Bilateral mastoids are well aerated.  Other: Leftward gaze. Otherwise negative orbits soft tissues. No discrete scalp soft tissue injury identified.  IMPRESSION: 1.  No acute traumatic injury identified. 2. Largely normal for age noncontrast CT appearance of the brain. But subtle asymmetric hypodensity in the central left cerebellum might be the sequelae of small vessel ischemia. 3. Mild paranasal sinus inflammation.   Electronically Signed   By: Genevie Ann M.D.   On: 12/18/2020 04:22        PHYSICAL EXAM GENERAL: Awake, alert in NAD HEENT: - Normocephalic and atraumatic, dry mm LUNGS - Clear to auscultation bilaterally with no wheezes CV - S1S2 RRR, no m/r/g, equal pulses bilaterally. ABDOMEN - Soft, nontender, nondistended with normoactive BS  NEURO:  Mental Status: AA&Ox4 Language: speech is clear.  Naming, repetition, fluency, and comprehension intact. Cranial Nerves: PERRL 18mm/brisk. EOMI, visual fields full, left facial symmetry (chronic), facial sensation intact, hearing intact, tongue/uvula/soft palate midline, normal sternocleidomastoid and trapezius muscle strength. No evidence of tongue atrophy or fibrillations Motor:5/5 in all extremities  Tone: is normal and bulk is normal Sensation- Intact to light touch bilaterally Coordination: Left side ataxia  Gait- deferred   ASSESSMENT/PLAN Mr.  Devlin Brink is a 65 y.o. male with history of Cervical dystonia, Depression, Erectile dysfunction due to arterial insufficiency, Functional dyspepsia, Hypertension, Pancreatitis (2005), and Rosacea. who presents with dizziness and falls. He presented to the ED after 3 days of hitting his head with a tree while on the lawn mower. NO LOC After going to bed, he noticed that he will veer to the L when he walks. He is having some dizziness associated with it. He is not having any speech troubles, difficulty with swallowing, vision changes, or weakness that he can tell. He has been compliant with his medications. He has never had a stroke before.  He is a former smoker.  He reports having anaphylaxis when taking aspirin.  Stroke - Acute left cerebellum and right thalamus ischemic infarct, likely due to left VA occlusion, ? Dissection vs. atherosclerosis  CT head no acute process   CTA H/N  1. Moderate to severe stenosis origin of left vertebral artery. Occlusion distal left vertebral artery at C1 which appears acute. Left PICA not visualized. Patient has acute left PICA infarct on MRI. 2. Atherosclerotic disease in the carotid bifurcation bilaterally. 50% diameter stenosis proximal right  internal carotid artery. No significant left carotid stenosis  MRI head  Acute infarct left inferior and posterior cerebellum. Small acute infarct right thalamus.  2D Echo  Pending   LDL 137  HgbA1c pending   SCD's and lovenox for VTE prophylaxis  No antithrombotic prior to admission, now on clopidogrel 75 mg daily and cilostazol 100mg  BID . Recommend to take both for 3 weeks then plavix only  Patient counseled to be compliant with his antithrombotic medications  Ongoing aggressive stroke risk factor management  Therapy recommendations:  CIR  Disposition:  Pending   Hypertension  UnsStable .  Permissive hypertension (OK if < 220/120) but gradually normalize in 5-7 days .  Long-term BP goal  normotensive  Hyperlipidemia  Home meds:  none  LDL 137, goal < 70  Add atorvastatin 80mg  daily   Continue statin at discharge  Diabetes type II  HgbA1c pending, goal < 7.0  Other Stroke Risk Factors  Former Cigarette smoker  ETOH use, advised to drink no more than 1 drink(s) a day  Other Active Problems  Depression per primary team   Recommendations To follow up with neurology as outpatient Neurology will sign off  Hospital day # 1 Beulah Gandy, DNP, ACNPC-AG   ATTENDING NOTE: I reviewed above note and agree with the assessment and plan. Pt was seen and examined.   65 year old male with history of hypertension, cervical dystonia, heavy alcohol use admitted for dizziness, gait imbalance, leaning towards to the left and walking with nausea/vomiting.  Per patient and wife, Wednesday patient was riding a lawn mower, which hit tree branch and flipped over, patient fell to the ground, cannot recall the event.  Thursday morning he got up from bed had dizziness, not up to walk, leaning towards the left.  Yesterday (Friday) CT showed left cerebellar infarct.  MRI showed left cerebellum and right thalamus punctate infarct.  CTA head and neck showed left VA origin stenosis and occluded at C1.  Bilateral ICA bulb atherosclerosis with right ICA 50% stenosis.  Carotid Doppler unremarkable but confirmed left VA origin stenosis.  2D echo pending, LDL 137, A1c pending, UDS positive for THC.  Creatinine 0.69.  On exam, patient awake alert, orientated x3, follow commands, no aphasia, fluent language.  No examination intact without deficit, no nystagmus, no disconjugate eyes, no obvious ataxia or dysmetria on the left.  Etiology for patient stroke likely due to left VA origin stenosis and occlusion at C1 level.  Patient does have other evidence of atherosclerosis, including bilateral ICA bulb atherosclerosis and right ICA 50% stenosis.  Underlying causes for left VA occlusion could be dissection  due to the lumbar accident or atherosclerosis with risk factors.  Recommend DAPT.  However, patient has allergy to aspirin, recommend Plavix 75 and cilostazol 100 mg twice daily for 3 weeks and then Plavix alone.  On Lipitor 80, continue on discharge.  Stroke risk factor modification, and educated on limit alcohol use.  PT/OT recommend CIR.  Neurology will sign off. Please call with questions. Pt will follow up with stroke clinic NP at [facility] in about 4 weeks. Thanks for the consult.   Rosalin Hawking, MD PhD Stroke Neurology 12/19/2020 5:00 PM  I discussed with Dr. Kennon Rounds. I spent  35 minutes in total face-to-face time with the patient, more than 50% of which was spent in counseling and coordination of care, reviewing test results, images and medication, and discussing the diagnosis, treatment plan and potential prognosis. This patient's care requiresreview of multiple databases, neurological  assessment, discussion with family, other specialists and medical decision making of high complexity. I had long discussion with wife and patient at bedside, updated pt current condition, treatment plan and potential prognosis, and answered all the questions.  They expressed understanding and appreciation.         To contact Stroke Continuity provider, please refer to [email or web address] After hours, contact General Neurology

## 2020-12-19 NOTE — Progress Notes (Signed)
Inpatient Rehab Admissions Coordinator Note:   Per therapy recommendations, pt was screened for CIR candidacy by Brittin Belnap, MS CCC-SLP. At this time, Pt. Appears to have functional decline and is a potential  candidate for CIR. Will place order for rehab consult per protocol.  Please contact me with questions.   Emmalene Kattner, MS, CCC-SLP Rehab Admissions Coordinator  [phone] (celll) [phone] (office)  

## 2020-12-19 NOTE — Plan of Care (Signed)
  Problem: Education: Goal: Knowledge of disease or condition will improve Outcome: Progressing Goal: Knowledge of secondary prevention will improve Outcome: Progressing Goal: Knowledge of patient specific risk factors addressed and post discharge goals established will improve Outcome: Progressing   

## 2020-12-20 DIAGNOSIS — I10 Essential (primary) hypertension: Secondary | ICD-10-CM | POA: Diagnosis not present

## 2020-12-20 MED ORDER — IBUPROFEN 200 MG PO TABS
400.0000 mg | ORAL_TABLET | ORAL | Status: DC | PRN
  Administered 2020-12-21: 400 mg via ORAL
  Filled 2020-12-20 (×2): qty 2

## 2020-12-20 MED ORDER — HYDRALAZINE HCL 20 MG/ML IJ SOLN
5.0000 mg | INTRAMUSCULAR | Status: DC | PRN
  Administered 2020-12-20: 5 mg via INTRAVENOUS
  Filled 2020-12-20: qty 1

## 2020-12-20 NOTE — Progress Notes (Signed)
Miguel [name] PROGRESS NOTE    Miguel [name]  IZT:[id number] DOB: July 08, 1956 DOA: 12/18/2020 PCP: Josetta Huddle, MD      Brief Narrative:  [name] is a 65 y.o. M with HTN, cervical dystonia, alcohol use who presented with dizziness, nausea/vomiting, and ataxia, acute onset prior to admission.  In the ER, cervical imaging showed cerebellar stroke with left distal vertebral artery occlusion.          Assessment & Plan:  Acute left cerebellar and right thalamic ischemic infarct MRI showed acute infarct in the left inferior cerebellum and right thalamus.  Neurology suspect this is due to vertebral artery occlusion, dissection versus atherosclerosis  -Echocardiogram done but read still pending, messaged cardiology -Carotid imaging unremarkable   -Lipids ordered: continue atorvastatin -Aspirin allergy: continue Pletal and Plavix for 3 weeks then Plavix alone -Atrial fibrillation: none on monitor    Hypertension -Continue irbesartan  Depression/Anxiety -Continue venlafaxine, clonazepam, Bupropion  Hyponatremia Mild, asyptomatic     Disposition: Status is: Inpatient  Remains inpatient appropriate because:Unsafe d/c plan   Dispo: The patient is from: Home              Anticipated d/c is to: CIR              Patient currently is medically stable to d/c.   Difficult to place patient No   Patient was admitted with a new cerebellar stroke.  He has ataxia and requires 2+ assist for mobility, and is recommended for CIR.  We will pursue CIR, hopefully this can happen in the next few days.   Level of care: Med-Surg       MDM: The below labs and imaging reports were reviewed and summarized above.  Medication management as above.   DVT prophylaxis: enoxaparin (LOVENOX) injection 40 mg Start: 12/18/20 1215  Code Status: FULL Family Communication: Wife at bedside          Subjective: Has a headache.  No fever, seizures, new weakness,  loss of consciousness, cough, vomiting.  Objective: Vitals:   12/20/20 0443 12/20/20 0700 12/20/20 1235 12/20/20 1500  BP: (!) 152/85 (!) 173/11 (!) 172/102 (!) 170/92  Pulse: 75 86 72 87  Resp: 17     Temp: 98.3 F (36.8 C) (!) 97.5 F (36.4 C) 99 F (37.2 C) 98.4 F (36.9 C)  TempSrc: Oral Axillary Oral Oral  SpO2: 94% 97% 96% 97%  Weight:      Height:        Intake/Output Summary (Last 24 hours) at 12/20/2020 1636 Last data filed at 12/20/2020 1113 Gross per 24 hour  Intake --  Output 1600 ml  Net -1600 ml   Filed Weights   12/18/20 0339  Weight: 89.4 kg    Examination: General appearance: adult male, alert and in no acute distress.   HEENT: Anicteric, conjunctiva pink, lids and lashes normal. No nasal deformity, discharge, epistaxis.  Lips moist.   Skin: Warm and dry.  no jaundice.  No suspicious rashes or lesions. Cardiac: RRR, nl S1-S2, no murmurs appreciated.  Capillary refill is brisk.  JVP normal.  No LE edema.  Radial pulses 2+ and symmetric. Respiratory: Normal respiratory rate and rhythm.  CTAB without rales or wheezes. Abdomen: Abdomen soft.  no TTP. No ascites, distension, hepatosplenomegaly.   MSK: No deformities or effusions. Neuro: Awake and alert.  EOMI, moves all extremities, with ataxia on the left side more than the right, generalized weakness. Speech fluent.    Psych: Sensorium intact  and responding to questions, attention normal. Affect blunted.  Judgment and insight appear normal.    Data Reviewed: I have personally reviewed following labs and imaging studies:  CBC: Recent Labs  Lab 12/18/20 0852 12/18/20 1136 12/19/20 0224  WBC 4.5 4.5 4.9  NEUTROABS 3.1  --   --   HGB 14.2 13.6 13.8  HCT 40.6 39.2 39.6  MCV 99.5 98.7 98.8  PLT 210 198 301   Basic Metabolic Panel: Recent Labs  Lab 12/18/20 0559 12/18/20 1136 12/19/20 0224  NA 134*  --  132*  K 4.0  --  3.7  CL 99  --  96*  CO2 25  --  24  GLUCOSE 165*  --  147*  BUN 9  --   7*  CREATININE 0.69 0.71 0.67  CALCIUM 9.5  --  9.5  MG  --  1.8  --   PHOS  --  2.6  --    GFR: Estimated Creatinine Clearance: 102.4 mL/min (by C-G formula based on SCr of 0.67 mg/dL). Liver Function Tests: Recent Labs  Lab 12/18/20 0559 12/19/20 0224  AST 22 18  ALT 25 21  ALKPHOS 59 56  BILITOT 0.7 0.8  PROT 6.9 6.7  ALBUMIN 4.2 4.0   No results for input(s): LIPASE, AMYLASE in the last 168 hours. No results for input(s): AMMONIA in the last 168 hours. Coagulation Profile: No results for input(s): INR, PROTIME in the last 168 hours. Cardiac Enzymes: No results for input(s): CKTOTAL, CKMB, CKMBINDEX, TROPONINI in the last 168 hours. BNP (last 3 results) No results for input(s): PROBNP in the last 8760 hours. HbA1C: No results for input(s): HGBA1C in the last 72 hours. CBG: No results for input(s): GLUCAP in the last 168 hours. Lipid Profile: Recent Labs    12/19/20 0224  CHOL 233*  HDL 63  LDLCALC 137*  TRIG 165*  CHOLHDL 3.7   Thyroid Function Tests: No results for input(s): TSH, T4TOTAL, FREET4, T3FREE, THYROIDAB in the last 72 hours. Anemia Panel: No results for input(s): VITAMINB12, FOLATE, FERRITIN, TIBC, IRON, RETICCTPCT in the last 72 hours. Urine analysis:    Component Value Date/Time   COLORURINE YELLOW 12/18/2020 [place] 12/18/2020 0643   LABSPEC 1.017 12/18/2020 0643   PHURINE 6.0 12/18/2020 [place] 12/18/2020 0643   HGBUR NEGATIVE 12/18/2020 [place] NEGATIVE 12/18/2020 0643   KETONESUR 80 (A) 12/18/2020 0643   PROTEINUR NEGATIVE 12/18/2020 0643   NITRITE NEGATIVE 12/18/2020 0643   LEUKOCYTESUR NEGATIVE 12/18/2020 0643   Sepsis Labs: @LABRCNTIP (procalcitonin:4,lacticacidven:4)  ) Recent Results (from the past 240 hour(s))  SARS CORONAVIRUS 2 (TAT 6-24 HRS) Nasopharyngeal Nasopharyngeal Swab     Status: None   Collection Time: 12/18/20  8:32 AM   Specimen: Nasopharyngeal Swab  Result Value  Ref Range Status   SARS Coronavirus 2 NEGATIVE NEGATIVE Final    Comment: (NOTE) SARS-CoV-2 target nucleic acids are NOT DETECTED.  The SARS-CoV-2 RNA is generally detectable in upper and lower respiratory specimens during the acute phase of infection. Negative results do not preclude SARS-CoV-2 infection, do not rule out co-infections with other pathogens, and should not be used as the sole basis for treatment or other patient management decisions. Negative results must be combined with clinical observations, patient history, and epidemiological information. The expected result is Negative.  Fact Sheet for Patients: SugarRoll.be  Fact Sheet for Healthcare Providers: https://www.woods-mathews.com/  This test is not yet approved or cleared by the [place]  FDA and  has been authorized for detection and/or diagnosis of SARS-CoV-2 by FDA under an Emergency Use Authorization (EUA). This EUA will remain  in effect (meaning this test can be used) for the duration of the COVID-19 declaration under Se ction 564(b)(1) of the Act, 21 U.S.C. section 360bbb-3(b)(1), unless the authorization is terminated or revoked sooner.  Performed at [facility], [place] [address], [place], [place] [address]          Radiology Studies: No results found.      Scheduled Meds: . atorvastatin  80 mg Oral Daily  . buPROPion  150 mg Oral Daily  . cilostazol  100 mg Oral BID  . clonazePAM  1 mg Oral Daily  . clopidogrel  75 mg Oral Daily  . enoxaparin (LOVENOX) injection  40 mg Subcutaneous Daily  . irbesartan  75 mg Oral Daily  . multivitamin with minerals  1 tablet Oral Daily  . venlafaxine XR  75 mg Oral Daily   Continuous Infusions:   LOS: 2 days    Time spent: 35 minutes    Edwin Dada, MD [name] 12/20/2020, 4:36 PM     Please page though [place] or Epic secure chat:  For [employer], Adult nurse

## 2020-12-20 NOTE — Progress Notes (Signed)
SLP Cancellation Note  Patient Details Name: Miguel [name] MRN: [id number] DOB: 11-Jul-1956   Cancelled treatment:       Reason Eval/Treat Not Completed: Patient declined, no reason specified (Pt was asleep upon SLP's arrival. Pt's wife denied the pt having any acute changes in speech/language/cognition and requested that SLP follow up another time since pt was "finally able to get some sleep." SLP will follow up on subsequent date.)  Degan Hanser I. Hardin Negus, [place], [place] Office number [phone] Pager [phone]   Horton Marshall 12/20/2020, 5:06 PM

## 2020-12-20 NOTE — Progress Notes (Signed)
   12/20/20 2026  Provider Notification  Provider Name/Title Dr Marlowe Sax  Date Provider Notified 12/20/20  Time Provider Notified 2027  Notification Type Page  Notification Reason Other (Comment) (Pt c/o of splitting headache, BP 189/94)  Provider response Other (Comment) (awaiting response)  Date of Provider Response 12/20/20  Time of Provider Response 2330 (with orders)

## 2020-12-20 NOTE — Progress Notes (Signed)
Patient walked 150 feet with a walker and 1 assist.

## 2020-12-20 NOTE — Consult Note (Signed)
Physical Medicine and Rehabilitation Consult Reason for Consult: Dizziness and falls Referring Physician: Dr. Myrene Buddy   HPI: Miguel [name] is a 65 y.o. right-handed male with history of cervical dystonia hypertension, depression, tobacco and alcohol use, pancreatitis 2005.  Per chart review patient lives with spouse.  Independent prior to admission.  Two-level home half bath on main level bed and bathroom upstairs.  Patient recently lost his job 3 weeks ago.  Presented 12/18/2020 with persistent dizziness x1 week as well as left-sided ataxia.  Patient had reported on 12/16/2020 while riding his lawnmower and struck a tree without loss of consciousness.  Cranial CT scan showed no acute traumatic injury identified.  CT cervical spine no acute traumatic injury identified.  Patient did not receive TPA.  MRI showed acute infarct left inferior posterior cerebellum.  Small acute infarct right thalamus.  Echocardiogram with ejection fraction of 45 to 50% mild global hypokinesis, mild MR.  No AAS.Marland Kitchen  Admission chemistries unremarkable except sodium 134 glucose 165, urinalysis negative nitrite, urine drug screen positive marijuana.  Presently maintained on Plavix for CVA prophylaxis as well as placed on Pletal 100 mg twice daily for 3 weeks then Plavix alone.  Subcutaneous Lovenox for DVT prophylaxis.  Maintained on a regular consistency diet.  Therapy evaluations completed due to patient's decreased functional mobility recommendations of physical medicine rehab consult.   Review of Systems  Constitutional: Negative for chills and fever.  HENT: Negative for hearing loss.   Eyes: Negative for blurred vision and double vision.  Respiratory: Negative for cough and shortness of breath.   Cardiovascular: Negative for chest pain and palpitations.  Gastrointestinal: Positive for constipation. Negative for heartburn and nausea.  Genitourinary: Negative for dysuria, flank pain and hematuria.   Musculoskeletal: Positive for falls and myalgias.  Skin: Negative for rash.  Neurological: Positive for dizziness.       Cervical dystonia  Psychiatric/Behavioral: Positive for depression. The patient has insomnia.   All other systems reviewed and are negative.  Past Medical History:  Diagnosis Date  . Cervical dystonia   . Depression   . Erectile dysfunction due to arterial insufficiency   . Functional dyspepsia   . Hypertension   . Pancreatitis 2005   likely secondary to alcohol use  . Rosacea    Past Surgical History:  Procedure Laterality Date  . no past surgery     Family History  Problem Relation Age of Onset  . Uterine cancer Mother   . Emphysema Father    Social History:  reports that he has quit smoking. He has never used smokeless tobacco. He reports current alcohol use. He reports that he does not use drugs. Allergies:  Allergies  Allergen Reactions  . Aspirin Anaphylaxis   Medications Prior to Admission  Medication Sig Dispense Refill  . acetaminophen (TYLENOL) 500 MG tablet Take 500 mg by mouth every 6 (six) hours as needed for moderate pain or headache.    Marland Kitchen buPROPion (WELLBUTRIN SR) 150 MG 12 hr tablet TAKE 1 TABLET BY MOUTH ONCE DAILY 90 (Patient taking differently: Take 150 mg by mouth daily.) 90 tablet 3  . Cholecalciferol (VITAMIN D) 2000 units CAPS Take 2,000 Units by mouth daily.    . clonazePAM (KLONOPIN) 1 MG tablet TAKE 1 TABLET BY MOUTH ONCE A DAY (Patient taking differently: Take 1 mg by mouth daily.) 90 tablet 1  . esomeprazole (NEXIUM) 40 MG capsule Take 40 mg by mouth as needed (heartburn).    . Multiple  Vitamin (MULTIVITAMIN) capsule Take 1 capsule by mouth daily.    . valsartan (DIOVAN) 80 MG tablet TAKE 1 TABLET BY MOUTH ONCE A DAY (Patient taking differently: Take 80 mg by mouth daily.) 90 tablet 3  . venlafaxine XR (EFFEXOR-XR) 75 MG 24 hr capsule TAKE 1 CAPSULE BY MOUTH ONCE A DAY (Patient taking differently: Take 75 mg by mouth daily.)  90 capsule 3  . IncobotulinumtoxinA (XEOMIN IM) Inject 300 Units into the muscle every 3 (three) months. (Patient not taking: No sig reported)      Home: Home Living Family/patient expects to be discharged to:: Private residence Living Arrangements: Spouse/significant other Available Help at Discharge: Family Type of Home: House Home Access: Stairs to enter Technical brewer of Steps: 1 Home Layout: Two level,1/2 bath on main level,Bed/bath upstairs Bathroom Shower/Tub: Multimedia programmer: Programmer, systems: Yes Home Equipment: None  Lives With: Spouse  Functional History: Prior Function Level of Independence: Independent Comments: independent ADLs, IADLs, driving; recently lost his job 3 weeks ago Functional Status:  Mobility: Bed Mobility Overal bed mobility: Needs Assistance Bed Mobility: Supine to Sit Supine to sit: Supervision Sit to supine: Min assist General bed mobility comments: supervision for safety Transfers Overall transfer level: Needs assistance Equipment used: Rolling walker (2 wheeled),2 person hand held assist Transfers: Sit to/from [employer] Sit to Stand: Min assist,+2 physical assistance,+2 safety/equipment Stand pivot transfers: Min assist,Mod assist,+2 physical assistance,+2 safety/equipment General transfer comment: min assist +2 for sit to stand with cueing for hand placement, pivot to recliner towards L side with mod assist +2 using RW.  Requires cueing for midline positioning, using target to visualize midline improves signifincantly. Ambulation/Gait Ambulation/Gait assistance: Min assist,Mod assist,+2 physical assistance,+2 safety/equipment Gait Distance (Feet): 10 Feet (x10') Assistive device: 2 person hand held assist,Rolling walker (2 wheeled) Gait Pattern/deviations: Step-to pattern,Decreased stride length,Wide base of support,Ataxic,Staggering left General Gait Details: Initially with HHAx2 but  required minA+2 at best. L lateral lean with manual assistance to correct at times. Patient able to self correct with cueing. Trialed use of RW for tactile feedback and support with improved ability to maintain midline. ModA+2 when told patient to "not think, just walk", patient exhibited increased gait speed, ataxia, and increased L lateral lean. Gait velocity: decreased    ADL: ADL Overall ADL's : Needs assistance/impaired Grooming: Wash/dry hands,Oral care,Minimal assistance,Standing Upper Body Bathing: Set up,Sitting Lower Body Bathing: Minimal assistance,Sit to/from stand,+2 for safety/equipment,+2 for physical assistance Upper Body Dressing : Min guard,Sitting Lower Body Dressing: Minimal assistance,+2 for physical assistance,+2 for safety/equipment,Sit to/from stand Toilet Transfer: Minimal assistance,Ambulation,RW Toilet Transfer Details (indicate cue type and reason): simulated in room Functional mobility during ADLs: Minimal assistance,Moderate assistance,+2 for physical assistance,+2 for safety/equipment,Rolling walker,Cueing for safety,Cueing for sequencing General ADL Comments: pt limited by impaired balance, decreased safety, dizziness  Cognition: Cognition Overall Cognitive Status: Impaired/Different from baseline Orientation Level: Oriented X4 Cognition Arousal/Alertness: Awake/alert Behavior During Therapy: Impulsive Overall Cognitive Status: Impaired/Different from baseline Area of Impairment: Safety/judgement,Awareness,Problem solving Following Commands: Follows one step commands inconsistently Safety/Judgement: Decreased awareness of safety Awareness: Emergent Problem Solving: Slow processing,Difficulty sequencing,Requires verbal cues General Comments: Impulsive at times. Aware of deficits, but requires cueing for safety and mulitmodal cueing to maintain midline.  Blood pressure (!) 155/83, pulse 96, temperature 97.7 F (36.5 C), temperature source Oral, resp.  rate 19, height 6' (1.829 m), weight 89.4 kg, SpO2 95 %. Physical Exam Neurological:     Comments: Patient is awake alert no acute distress.  Makes eye  contact with examiner.  Oriented to person place and time.  Follows simple commands.    General: No acute distress Mood and affect are appropriate Heart: Regular rate and rhythm no rubs murmurs or extra sounds Lungs: Clear to auscultation, breathing unlabored, no rales or wheezes Abdomen: Positive bowel sounds, soft nontender to palpation, nondistended Extremities: No clubbing, cyanosis, or edema Skin: No evidence of breakdown, no evidence of rash Neurologic: Cranial nerves II through XII intact, motor strength is 5/5 in bilateral deltoid, bicep, tricep, grip, hip flexor, knee extensors, ankle dorsiflexor and plantar flexor Sensory exam normal sensation to light touch  in bilateral upper and lower extremities Cerebellar exam normal finger to nose to finger as well as heel to shin in right  upper and lower extremities, no dysmetria LUE, mild dysmetria LLE Musculoskeletal: Full range of motion in all 4 extremities. No joint swelling   No results found for this or any previous visit (from the past 24 hour(s)). No results found.   Assessment/Plan: Diagnosis: Left cerebellar infarct with limb and trunkal ataxia, central vestibular disorder 1. Does the need for close, 24 hr/day medical supervision in concert with the patient's rehab needs make it unreasonable for this patient to be served in a less intensive setting? Yes 2. Co-Morbidities requiring supervision/potential complications: Hx HTN 3. Due to bladder management, bowel management, safety, skin/wound care, disease management, medication administration, pain management and patient education, does the patient require 24 hr/day rehab nursing? Yes 4. Does the patient require coordinated care of a physician, rehab nurse, therapy disciplines of PT, OT to address physical and functional deficits  in the context of the above medical diagnosis(es)? Yes Addressing deficits in the following areas: balance, endurance, locomotion, strength, transferring, bowel/bladder control, bathing, dressing, toileting and psychosocial support 5. Can the patient actively participate in an intensive therapy program of at least 3 hrs of therapy per day at least 5 days per week? Yes 6. The potential for patient to make measurable gains while on inpatient rehab is excellent 7. Anticipated functional outcomes upon discharge from inpatient rehab are modified independent and supervision  with PT, modified independent and supervision with OT, n/a with SLP. 8. Estimated rehab length of stay to reach the above functional goals is: 7-10d 9. Anticipated discharge destination: Home 10. Overall Rehab/Functional Prognosis: good  RECOMMENDATIONS: This patient's condition is appropriate for continued rehabilitative care in the following setting: CIR Patient has agreed to participate in recommended program. Yes Note that insurance prior authorization may be required for reimbursement for recommended care.  Comment:   Cathlyn Parsons, PA-C 12/21/2020   "I have personally performed a face to face diagnostic evaluation of this patient.  Additionally, I have reviewed and concur with the physician assistant's documentation above." Charlett Blake M.D. [employer] Fellow Am Acad of Phys Med and Rehab Diplomate Am Board of Electrodiagnostic Med Fellow Am Board of Interventional Pain

## 2020-12-20 NOTE — Plan of Care (Signed)
Technical Difficulties with echocardiogram report from 12/18/20 3:07 PM  1. LVEF 45-50% mild global hypokinesis. 2.  RV Fx WNL 3. Mild MR 4. Trivial Ai. 5. WNL IVC. 6. Normal Diastology 7. No AS, trivial TR 8. No pericardial effusion, normal bi-atrial size.  IT ticket placed.  Werner Lean, MD

## 2020-12-21 DIAGNOSIS — Z7289 Other problems related to lifestyle: Secondary | ICD-10-CM

## 2020-12-21 DIAGNOSIS — F418 Other specified anxiety disorders: Secondary | ICD-10-CM

## 2020-12-21 DIAGNOSIS — I10 Essential (primary) hypertension: Secondary | ICD-10-CM | POA: Diagnosis not present

## 2020-12-21 DIAGNOSIS — I639 Cerebral infarction, unspecified: Secondary | ICD-10-CM

## 2020-12-21 DIAGNOSIS — F519 Sleep disorder not due to a substance or known physiological condition, unspecified: Secondary | ICD-10-CM

## 2020-12-21 DIAGNOSIS — F419 Anxiety disorder, unspecified: Secondary | ICD-10-CM | POA: Diagnosis not present

## 2020-12-21 DIAGNOSIS — G243 Spasmodic torticollis: Secondary | ICD-10-CM

## 2020-12-21 LAB — HEMOGLOBIN A1C
Hgb A1c MFr Bld: 6.4 % — ABNORMAL HIGH (ref 4.8–5.6)
Mean Plasma Glucose: 137 mg/dL

## 2020-12-21 MED ORDER — HYDRALAZINE HCL 20 MG/ML IJ SOLN
10.0000 mg | INTRAMUSCULAR | Status: DC | PRN
  Administered 2020-12-22: 10 mg via INTRAVENOUS
  Filled 2020-12-21: qty 1

## 2020-12-21 MED ORDER — IBUPROFEN 200 MG PO TABS
400.0000 mg | ORAL_TABLET | ORAL | Status: DC | PRN
  Administered 2020-12-21: 400 mg via ORAL
  Filled 2020-12-21: qty 2

## 2020-12-21 MED ORDER — ONDANSETRON HCL 4 MG/2ML IJ SOLN
4.0000 mg | Freq: Four times a day (QID) | INTRAMUSCULAR | Status: DC | PRN
  Administered 2020-12-23 – 2020-12-25 (×3): 4 mg via INTRAVENOUS
  Filled 2020-12-21 (×3): qty 2

## 2020-12-21 MED ORDER — ONDANSETRON 4 MG PO TBDP
4.0000 mg | ORAL_TABLET | Freq: Three times a day (TID) | ORAL | Status: DC | PRN
  Administered 2020-12-22 – 2020-12-30 (×3): 4 mg via ORAL
  Filled 2020-12-21 (×3): qty 1

## 2020-12-21 MED ORDER — AMLODIPINE BESYLATE 10 MG PO TABS
10.0000 mg | ORAL_TABLET | Freq: Every day | ORAL | Status: DC
  Administered 2020-12-21 – 2020-12-25 (×5): 10 mg via ORAL
  Filled 2020-12-21 (×5): qty 1

## 2020-12-21 MED ORDER — LORAZEPAM 1 MG PO TABS: 1.0000 mg | ORAL_TABLET | ORAL | Status: DC | PRN

## 2020-12-21 MED ORDER — AMLODIPINE BESYLATE 10 MG PO TABS: 10.0000 mg | ORAL_TABLET | Freq: Every day | ORAL | Status: DC

## 2020-12-21 NOTE — Progress Notes (Addendum)
Inpatient Rehab Admissions Coordinator:   I met with Pt. And spoke with Pt.'s wife over the phone regarding potential CIR admission. Per Pt.'s wife, she is working on getting Pt.'s BCBS policy re-instated with COBRA, but it has not yet been re-instated. We will have to wait for policy to be re-instated before we can get pre-authorization or  We could potentially admit Pt. As self-pay and attempt to get retro-authorization. Pt. And wife would have to be agreeable to come and potentially getting a bill for services, as we cannot guarantee retro auth. I will discuss this with Pt.'s wife.   Clemens Catholic, [place], Nina Admissions Coordinator  [phone] ([place]) [phone] (office)

## 2020-12-21 NOTE — Progress Notes (Signed)
PT Cancellation Note  Patient Details Name: Miguel [name] MRN: [id number] DOB: 1956-01-07   Cancelled Treatment:    Reason Eval/Treat Not Completed: Medical issues which prohibited therapy. Pt nauseated and threw up this AM. Will re attempt in PM time permitting  Lyanne Co, DPT Acute Rehabilitation Services [phone]   [place] 12/21/2020, 1:28 PM

## 2020-12-21 NOTE — Progress Notes (Signed)
Physical Therapy Treatment Patient Details Name: Miguel [name] MRN: [id number] DOB: [birth year] Today's Date: 12/21/2020    History of Present Illness 65 yo male with onset of new L cerebellar and R thalamic infarcts was brought to ED, noted torticollis with no ins to get injection.  PMHx:  EtOH, pancreatitis, multi level spinal stenosis C-spine, ankylosed C2-C3, cervical disc endplate degeneration, HTN, atherosclerosis, torticollis    PT Comments    Pt fully participated in session. Pt receptive to education and use of mirror for feedback to improve orientation and postural alignment. Pt educated on foot placement and requiring visual feedback to improve foot placement to improve balance in standing. Pt will benefit from skilled PT to address deficits in balance, strength, coordination, gait and safety to maximize independence with functional mobility prior to discharge. Pt can tolerate 3 hours of therapy.   Follow Up Recommendations  CIR     Equipment Recommendations  None recommended by PT    Recommendations for Other Services       Precautions / Restrictions Precautions Precautions: Fall Precaution Comments: light headed with elevated BP in standing Restrictions Weight Bearing Restrictions: No Other Position/Activity Restrictions: falls to L side in standing    Mobility  Bed Mobility                    Transfers Overall transfer level: Needs assistance Equipment used: Rolling walker (2 wheeled);1 person hand held assist Transfers: Sit to/from Stand           General transfer comment: performed sit<>stand multiple times in front of mirror for visual feedback for orientation to correct L lateral lean. with visual feedback pt able to correct to midline once in standing. verbal cueing and visual feedback needed to improve foot placement prior to transfer due to increased adduction of LLE  Ambulation/Gait Ambulation/Gait assistance: Mod assist Gait Distance (Feet):  12 Feet Assistive device: Rolling walker (2 wheeled)       General Gait Details: increased L lateral lean with poor foot placement   Stairs             Wheelchair Mobility    Modified Rankin (Stroke Patients Only) Modified Rankin (Stroke Patients Only) Pre-Morbid Rankin Score: No symptoms Modified Rankin: Moderately severe disability     Balance               Standing balance comment: performed satnding with B UE at sink with mirror for visual feedback. performed weight shifting R and L wtih eyes open, progressed to performing with eyes closed wtih cueing to stop in the middle and then correct using mirror for feedback. with continued trials improved midline noted. Progressed to marching in place LL only with cueing to make weight thorugh B UE equal to decrease lateral lean L, performed same task wtih marching RLE. Progressed to marching wtih reciprocal pattern iwth pt requiring visual feedback looking at feet to improve foot placement due to narrow BOS causing increased lateral lean L                            Cognition                                              Exercises      General Comments        Pertinent  Vitals/Pain      Home Living                      Prior Function            PT Goals (current goals can now be found in the care plan section) Acute Rehab PT Goals Patient Stated Goal: to get stronger and be able to walk PT Goal Formulation: With patient/family Time For Goal Achievement: 01/01/21 Potential to Achieve Goals: Good Progress towards PT goals: Progressing toward goals    Frequency    Min 4X/week      PT Plan Current plan remains appropriate    Co-evaluation              AM-PAC PT "6 Clicks" Mobility   Outcome Measure  Help needed turning from your back to your side while in a flat bed without using bedrails?: A Little Help needed moving from lying on your back to sitting  on the side of a flat bed without using bedrails?: A Little Help needed moving to and from a bed to a chair (including a wheelchair)?: A Lot Help needed standing up from a chair using your arms (e.g., wheelchair or bedside chair)?: A Little Help needed to walk in hospital room?: A Lot Help needed climbing 3-5 steps with a railing? : Total 6 Click Score: 14    End of Session Equipment Utilized During Treatment: Gait belt Activity Tolerance: Patient tolerated treatment well Patient left: in chair;with call bell/phone within reach;with chair alarm set;with family/visitor present Nurse Communication: Mobility status PT Visit Diagnosis: Unsteadiness on feet (R26.81);Ataxic gait (R26.0);Difficulty in walking, not elsewhere classified (R26.2)     Time: 9485-4627 PT Time Calculation (min) (ACUTE ONLY): 31 min  Charges:  $Neuromuscular Re-education: 23-37 mins                     Lyanne Co, DPT Acute Rehabilitation Services [phone]   [place] 12/21/2020, 4:34 PM

## 2020-12-21 NOTE — Progress Notes (Addendum)
Patient vomited after taking am medications one at a time; he states once he had them down; he suddenly had to vomit.  MD notified.  Nausea and headache medication given prn today to help; continue to monitor discomfort and BP.

## 2020-12-21 NOTE — Evaluation (Signed)
Speech Language Pathology Evaluation Patient Details Name: Miguel [name] MRN: [id number] DOB: [birth year] Today's Date: 12/21/2020 Time: 4650-3546 SLP Time Calculation (min) (ACUTE ONLY): 14 min  Problem List:  Patient Active Problem List   Diagnosis Date Noted  . Alcohol use 12/19/2020  . Acute CVA (cerebrovascular accident) ([place]) 12/18/2020  . Depression with anxiety 12/18/2020  . Essential hypertension 12/18/2020  . Isolated cervical dystonia 12/06/2016   Past Medical History:  Past Medical History:  Diagnosis Date  . Cervical dystonia   . Depression   . Erectile dysfunction due to arterial insufficiency   . Functional dyspepsia   . Hypertension   . Pancreatitis 2005   likely secondary to alcohol use  . Rosacea    Past Surgical History:  Past Surgical History:  Procedure Laterality Date  . no past surgery     HPI:  Pt is a 65 y/o male with PMHx:  EtOH, pancreatitis, multi level spinal stenosis C-spine, ankylosed C2-C3, cervical disc endplate degeneration, HTN, atherosclerosis, torticollis. Pt presented to the ED on 4/15 after nausea, vomiting, dizziness and a tendency to veer to his left when he is trying to walk across multiple days. MRI brain 4/15: Acute infarct left inferior and posterior cerebellum. Small acute  infarct right thalamus.Acute infarct left inferior and posterior cerebellum. Small acute infarct right thalamus. CTA 4/15: Moderate to severe stenosis origin of left vertebral artery. 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   Assessment / Plan / Recommendation Clinical Impression  Pt presents with a mild cognitive-communicative impairment as evidenced by his score of 24 out of 30 on the SLUMS (27-30 is considered normal). Pt shows deficits in the areas of short-term memory, divergent naming, and selective attention to details during a during an auditory comprehension task. He also shows impairment in intellectual awareness as he is unable to state how his  cognitive-communicative skills are different from baseline. However, pt did demonstrate potential signs of emergent awareness briefly during today's assessment. Pt did well with a simple and more complex monetary task and also shows strengths in the areas of working memory, orientation, and following simple commands. Pt would benefit from continued SLP services for therapy in these areas of deficits.     SLP Assessment  SLP Recommendation/Assessment: Patient needs continued Speech Lanaguage Pathology Services SLP Visit Diagnosis: Cognitive communication deficit (R41.841)    Follow Up Recommendations  Inpatient Rehab    Frequency and Duration min 2x/week  2 weeks      SLP Evaluation Cognition  Overall Cognitive Status: Impaired/Different from baseline Arousal/Alertness: Awake/alert Orientation Level: Oriented to person;Oriented to place;Oriented to time Attention: Sustained Sustained Attention: Appears intact Selective Attention: Impaired Selective Attention Impairment: Verbal basic Memory: Impaired Memory Impairment: Decreased short term memory Decreased Short Term Memory: Verbal basic Awareness: Impaired Awareness Impairment: Intellectual impairment (Shows signs of emergent awareness) Problem Solving: Appears intact Safety/Judgment: Impaired       Comprehension  Auditory Comprehension Overall Auditory Comprehension: Impaired Commands: Within Functional Limits Conversation: Simple Other Conversation Comments: Correctly answered 3 out of 4 auditory comprehension questions Interfering Components:  (Memory)    Expression Expression Primary Mode of Expression: Verbal Verbal Expression Overall Verbal Expression: Impaired Initiation: No impairment Level of Generative/Spontaneous Verbalization: Phrase;Sentence Naming: Impairment Divergent:  (Named 9 animals) Pragmatics: No impairment Non-Verbal Means of Communication: Not applicable   Oral / Motor  Motor Speech Overall  Motor Speech: Appears within functional limits for tasks assessed   GO  Jeanine Luz., SLP Student 12/21/2020, 11:24 AM

## 2020-12-21 NOTE — Progress Notes (Addendum)
PROGRESS NOTE    Miguel [name]  QZR:[id number] DOB: 1956/01/10 DOA: 12/18/2020 PCP: Josetta Huddle, MD    Brief Narrative:  [name] was admitted to the hospital with a working diagnosis of acute ischemic CVA.  65 year old male with past medical history for hypertension, depression, dyspepsia and anxiety who presented with dizziness and mechanical fall.  Reported 3 days of leaning to the left while walking, associated with dizziness.  He did sustain a head trauma while riding his lawnmower and hitting a tree branch about 3 days ago.  On his initial physical examination his blood pressure was 196/97, 108/100, heart rate 58, respiratory rate 16, temperature 98, oxygen saturation 98%, his lungs were clear to auscultation bilaterally, heart S1-S2, present, rhythmic, soft abdomen, no lower extremity edema.  Strength was preserved upper and lower extremities.  Head CT without significant acute changes.  Subtle asymmetric hypodensity in the central left cerebellum. Brain MRI with acute infarct left anterior and posterior cerebellum, small acute infarct right thalamus.  EKG 55 bpm, normal axis, normal intervals, sinus rhythm, no ST segment or T wave changes.  Assessment & Plan:   Principal Problem:   Acute CVA (cerebrovascular accident) [facility]) Active Problems:   Isolated cervical dystonia   Depression with anxiety   Essential hypertension   Alcohol use   1. Acute ischemic CVA left anterior and posterior cerebellum, right thalamic infarct.  Patient continue to have significant balance alteration and difficulty ambulating.   Continue medical therapy with clopidogrel and cilostazole for 3 weeks, then continue with clopidogrel alone.  Continue statin therapy with atorvastatin 80 mg daily.   Pending CIR evaluation.   2. Uncontrolled HTN. Patient with uncontrolled HTN, today exacerbated by headache.  Continue blood pressure control with irbesartan, if not controlled will add second agent.   3.  Depression and alcohol abuse. Continue with bupropion, venlafaxine and clonazepam. Add as needed lorazepam and GI prophylaxis with pantoprazole.   4. Headache/ associated with nausea and vomiting. Will continue pain control with ibuprofen. Patient had this medication in the past with no reaction. Add antiemetic therapy with zofran,.   5. Hyponatremia. On 04/16 Na down to 132, will follow renal function in am. Patient toady with nausea and poor oral intake.   Status is: Inpatient  Remains inpatient appropriate because:Inpatient level of care appropriate due to severity of illness   Dispo: The patient is from: Home              Anticipated d/c is to: CIR              Patient currently is not medically stable to d/c.   Difficult to place patient No   DVT prophylaxis: Enoxaparin   Code Status:   full  Family Communication:  I spoke with patient's wife at the bedside, we talked in detail about patient's condition, plan of care and prognosis and all questions were addressed.      Consultants:   Neurology    Subjective: Patient is having headache, associated with nausea and vomiting, continue to have poor balance and ambulatory dysfunction, not back to his baseline.   Objective: Vitals:   12/20/20 2103 12/21/20 0001 12/21/20 0335 12/21/20 0934  BP: (!) 173/97 (!) 157/88 (!) 155/83 (!) 165/97  Pulse: 83 99 96 82  Resp:  17 19 16   Temp: 98.1 F (36.7 C) 98.4 F (36.9 C) 97.7 F (36.5 C) 98.1 F (36.7 C)  TempSrc: Oral Oral Oral Oral  SpO2: 94% 97% 95% 96%  Weight:  Height:       No intake or output data in the 24 hours ending 12/21/20 1236 Filed Weights   12/18/20 0339  Weight: 89.4 kg    Examination:   General: Not in pain or dyspnea. Deconditioned  Neurology: Awake and alert, non focal  E ENT: mild pallor, no icterus, oral mucosa moist Cardiovascular: No JVD. S1-S2 present, rhythmic, no gallops, rubs, or murmurs. No lower extremity edema. Pulmonary: positive  breath sounds bilaterally, adequate air movement, no wheezing, rhonchi or rales. Gastrointestinal. Abdomen soft and non tender Skin. No rashes Musculoskeletal: no joint deformities     Data Reviewed: I have personally reviewed following labs and imaging studies  CBC: Recent Labs  Lab 12/18/20 0852 12/18/20 1136 12/19/20 0224  WBC 4.5 4.5 4.9  NEUTROABS 3.1  --   --   HGB 14.2 13.6 13.8  HCT 40.6 39.2 39.6  MCV 99.5 98.7 98.8  PLT 210 198 701   Basic Metabolic Panel: Recent Labs  Lab 12/18/20 0559 12/18/20 1136 12/19/20 0224  NA 134*  --  132*  K 4.0  --  3.7  CL 99  --  96*  CO2 25  --  24  GLUCOSE 165*  --  147*  BUN 9  --  7*  CREATININE 0.69 0.71 0.67  CALCIUM 9.5  --  9.5  MG  --  1.8  --   PHOS  --  2.6  --    GFR: Estimated Creatinine Clearance: 102.4 mL/min (by C-G formula based on SCr of 0.67 mg/dL). Liver Function Tests: Recent Labs  Lab 12/18/20 0559 12/19/20 0224  AST 22 18  ALT 25 21  ALKPHOS 59 56  BILITOT 0.7 0.8  PROT 6.9 6.7  ALBUMIN 4.2 4.0   No results for input(s): LIPASE, AMYLASE in the last 168 hours. No results for input(s): AMMONIA in the last 168 hours. Coagulation Profile: No results for input(s): INR, PROTIME in the last 168 hours. Cardiac Enzymes: No results for input(s): CKTOTAL, CKMB, CKMBINDEX, TROPONINI in the last 168 hours. BNP (last 3 results) No results for input(s): PROBNP in the last 8760 hours. HbA1C: Recent Labs    12/19/20 0224  HGBA1C 6.4*   CBG: No results for input(s): GLUCAP in the last 168 hours. Lipid Profile: Recent Labs    12/19/20 0224  CHOL 233*  HDL 63  LDLCALC 137*  TRIG 165*  CHOLHDL 3.7   Thyroid Function Tests: No results for input(s): TSH, T4TOTAL, FREET4, T3FREE, THYROIDAB in the last 72 hours. Anemia Panel: No results for input(s): VITAMINB12, FOLATE, FERRITIN, TIBC, IRON, RETICCTPCT in the last 72 hours.    Radiology Studies: I have reviewed all of the imaging during this  hospital visit personally     Scheduled Meds: . atorvastatin  80 mg Oral Daily  . buPROPion  150 mg Oral Daily  . cilostazol  100 mg Oral BID  . clonazePAM  1 mg Oral Daily  . clopidogrel  75 mg Oral Daily  . enoxaparin (LOVENOX) injection  40 mg Subcutaneous Daily  . irbesartan  75 mg Oral Daily  . multivitamin with minerals  1 tablet Oral Daily  . venlafaxine XR  75 mg Oral Daily   Continuous Infusions:   LOS: 3 days        Anjenette Gerbino Gerome Apley, MD

## 2020-12-22 DIAGNOSIS — F419 Anxiety disorder, unspecified: Secondary | ICD-10-CM | POA: Diagnosis not present

## 2020-12-22 DIAGNOSIS — I10 Essential (primary) hypertension: Secondary | ICD-10-CM | POA: Diagnosis not present

## 2020-12-22 DIAGNOSIS — G243 Spasmodic torticollis: Secondary | ICD-10-CM | POA: Diagnosis not present

## 2020-12-22 DIAGNOSIS — Z7289 Other problems related to lifestyle: Secondary | ICD-10-CM | POA: Diagnosis not present

## 2020-12-22 LAB — BASIC METABOLIC PANEL
Anion gap: 9 (ref 5–15)
BUN: 9 mg/dL (ref 8–23)
CO2: 29 mmol/L (ref 22–32)
Calcium: 10.2 mg/dL (ref 8.9–10.3)
Chloride: 95 mmol/L — ABNORMAL LOW (ref 98–111)
Creatinine, Ser: 0.85 mg/dL (ref 0.61–1.24)
GFR, Estimated: >60 mL/min (ref >60)
Glucose, Bld: 202 mg/dL — ABNORMAL HIGH (ref 70–99)
Potassium: 4.1 mmol/L (ref 3.5–5.1)
Sodium: 133 mmol/L — ABNORMAL LOW (ref 135–145)

## 2020-12-22 MED ORDER — TRAMADOL HCL 50 MG PO TABS
50.0000 mg | ORAL_TABLET | Freq: Four times a day (QID) | ORAL | Status: DC | PRN
  Administered 2020-12-22 – 2020-12-23 (×4): 50 mg via ORAL
  Filled 2020-12-22 (×4): qty 1

## 2020-12-22 NOTE — Progress Notes (Signed)
Inpatient Rehab Admissions Coordinator:   I met with Pt. To discuss potential CIR admission. Pt. Stated interest. It is still unclear whether or not Pt.'s COBRA insurance has went into effect, so I do not have insurance auth at this time. I will call [employer] today to see if policy is in effect and to clarify the timeline for when it might go into effect.   Clemens Catholic, [place], [place] Admissions Coordinator  [phone] ([place]) [phone] (office)

## 2020-12-22 NOTE — Progress Notes (Signed)
PT Cancellation Note  Patient Details Name: Miguel [name] MRN: [id number] DOB: February 12, 1956   Cancelled Treatment:    Reason Eval/Treat Not Completed: Other (comment)   Pt resting and his visitor requests PT hold right now;  Discussed with Linus Orn, RN;  Will follow up later today as time allows;  Otherwise, will follow up for PT tomorrow;   Thank you,  Roney Marion, PT  Acute Rehabilitation Services Pager [phone] Office [place] 12/22/2020, 1:08 PM

## 2020-12-22 NOTE — Progress Notes (Signed)
PROGRESS NOTE    Miguel [name]  EUM:[id number] DOB: Jun 22, 1956 DOA: 12/18/2020 PCP: Josetta Huddle, MD    Brief Narrative:  [name] was admitted to the hospital with a working diagnosis of acute ischemic CVA.  65 year old male with past medical history for hypertension, depression, dyspepsia and anxiety who presented with dizziness and mechanical fall.  Reported 3 days of leaning to the left while walking, associated with dizziness.  He did sustain a head trauma while riding his lawnmower and hitting a tree branch about 3 days ago.  On his initial physical examination his blood pressure was 196/97, 108/100, heart rate 58, respiratory rate 16, temperature 98, oxygen saturation 98%, his lungs were clear to auscultation bilaterally, heart S1-S2, present, rhythmic, soft abdomen, no lower extremity edema.  Strength was preserved upper and lower extremities.  Head CT without significant acute changes.  Subtle asymmetric hypodensity in the central left cerebellum. Brain MRI with acute infarct left anterior and posterior cerebellum, small acute infarct right thalamus.  EKG 55 bpm, normal axis, normal intervals, sinus rhythm, no ST segment or T wave changes.  Patient has been placed on dual antiplatelet therapy and adjusted antihypertensive regimen.  Pending transfer to CIR   Assessment & Plan:   Principal Problem:   Acute CVA (cerebrovascular accident) [facility]) Active Problems:   Isolated cervical dystonia   Depression with anxiety   Essential hypertension   Alcohol use    1. Acute ischemic CVA left anterior and posterior cerebellum, right thalamic infarct.  Persistent headache, dizziness and nausea.   Tolerating well medical therapy with clopidogrel and cilostazole, plan to continue  for 3 weeks, then continue with clopidogrel alone.  Atorvastatin 80 mg daily.   Plan to transfer to CIR to continue therapy.   2. Uncontrolled HTN. Blood pressure continue to be high, patient has  been in pain, from headache.  Blood pressure has been 140 to 540 mmHg systolic, when patient in pain goes up to 176/ 106 mmHg. Continue irbersartan and amlodipine, add tramadol for better pain control.  Antiemetics with as needed zofran,   3. Depression and alcohol abuse. On bupropion, venlafaxine and clonazepam. Continue with lorazepam as needed for anxiety. GI prophylaxis with pantoprazole.    4. Headache/ persistent headache with nausea, mild improvement with ibuprofen. Will add tramadol for better pain control, continue with as needed zofran.   5. Hyponatremia. Stable renal function wit serum cr at 0,85, K is 4,1 and serum bicarbonate at 29. Follow up on renal function closely.    Status is: Inpatient  Remains inpatient appropriate because:Inpatient level of care appropriate due to severity of illness   Dispo: The patient is from: Home              Anticipated d/c is to: CIR              Patient currently is medically stable to d/c.   Difficult to place patient No   DVT prophylaxis: Enoxaparin   Code Status:   full  Family Communication:  No family at the bedside     Consultants:   Neurology     Subjective: Patient continue to have headache moderate to sever in intensity, no worsening factors, only mild improvement with ibuprofen, associated with nausea but not vomiting.   Objective: Vitals:   12/22/20 0402 12/22/20 0753 12/22/20 1105 12/22/20 1234  BP: (!) 147/92 (!) 138/94 (!) 169/106 (!) 176/106  Pulse: 91 82 88 83  Resp: 16 16 18 16   Temp: 98.2 F (  36.8 C) (!) 97.5 F (36.4 C) 98.2 F (36.8 C)   TempSrc: Oral Oral Oral   SpO2: 92% 98% 96% 96%  Weight:      Height:       No intake or output data in the 24 hours ending 12/22/20 1334 Filed Weights   12/18/20 0339  Weight: 89.4 kg    Examination:   General: Not in pain or dyspnea, deconditioned  Neurology: Awake and alert, non focal  E ENT: mild pallor, no icterus, oral mucosa  moist Cardiovascular: No JVD. S1-S2 present, rhythmic, no gallops, rubs, or murmurs. Trace lower extremity edema. Pulmonary: positive breath sounds bilaterally, adequate air movement, no wheezing, rhonchi or rales. Gastrointestinal. Abdomen soft and non tender Skin. No rashes Musculoskeletal: no joint deformities     Data Reviewed: I have personally reviewed following labs and imaging studies  CBC: Recent Labs  Lab 12/18/20 0852 12/18/20 1136 12/19/20 0224  WBC 4.5 4.5 4.9  NEUTROABS 3.1  --   --   HGB 14.2 13.6 13.8  HCT 40.6 39.2 39.6  MCV 99.5 98.7 98.8  PLT 210 198 213   Basic Metabolic Panel: Recent Labs  Lab 12/18/20 0559 12/18/20 1136 12/19/20 0224 12/22/20 0359  NA 134*  --  132* 133*  K 4.0  --  3.7 4.1  CL 99  --  96* 95*  CO2 25  --  24 29  GLUCOSE 165*  --  147* 202*  BUN 9  --  7* 9  CREATININE 0.69 0.71 0.67 0.85  CALCIUM 9.5  --  9.5 10.2  MG  --  1.8  --   --   PHOS  --  2.6  --   --    GFR: Estimated Creatinine Clearance: 96.4 mL/min (by C-G formula based on SCr of 0.85 mg/dL). Liver Function Tests: Recent Labs  Lab 12/18/20 0559 12/19/20 0224  AST 22 18  ALT 25 21  ALKPHOS 59 56  BILITOT 0.7 0.8  PROT 6.9 6.7  ALBUMIN 4.2 4.0   No results for input(s): LIPASE, AMYLASE in the last 168 hours. No results for input(s): AMMONIA in the last 168 hours. Coagulation Profile: No results for input(s): INR, PROTIME in the last 168 hours. Cardiac Enzymes: No results for input(s): CKTOTAL, CKMB, CKMBINDEX, TROPONINI in the last 168 hours. BNP (last 3 results) No results for input(s): PROBNP in the last 8760 hours. HbA1C: No results for input(s): HGBA1C in the last 72 hours. CBG: No results for input(s): GLUCAP in the last 168 hours. Lipid Profile: No results for input(s): CHOL, HDL, LDLCALC, TRIG, CHOLHDL, LDLDIRECT in the last 72 hours. Thyroid Function Tests: No results for input(s): TSH, T4TOTAL, FREET4, T3FREE, THYROIDAB in the last 72  hours. Anemia Panel: No results for input(s): VITAMINB12, FOLATE, FERRITIN, TIBC, IRON, RETICCTPCT in the last 72 hours.    Radiology Studies: I have reviewed all of the imaging during this hospital visit personally     Scheduled Meds: . amLODipine  10 mg Oral Daily  . atorvastatin  80 mg Oral Daily  . buPROPion  150 mg Oral Daily  . cilostazol  100 mg Oral BID  . clonazePAM  1 mg Oral Daily  . clopidogrel  75 mg Oral Daily  . enoxaparin (LOVENOX) injection  40 mg Subcutaneous Daily  . irbesartan  75 mg Oral Daily  . multivitamin with minerals  1 tablet Oral Daily  . venlafaxine XR  75 mg Oral Daily   Continuous Infusions:  LOS: 4 days        Esiah Bazinet Gerome Apley, MD

## 2020-12-23 DIAGNOSIS — Z7289 Other problems related to lifestyle: Secondary | ICD-10-CM | POA: Diagnosis not present

## 2020-12-23 DIAGNOSIS — F419 Anxiety disorder, unspecified: Secondary | ICD-10-CM | POA: Diagnosis not present

## 2020-12-23 DIAGNOSIS — I10 Essential (primary) hypertension: Secondary | ICD-10-CM | POA: Diagnosis not present

## 2020-12-23 DIAGNOSIS — F418 Other specified anxiety disorders: Secondary | ICD-10-CM | POA: Diagnosis not present

## 2020-12-23 MED ORDER — TRAMADOL HCL 50 MG PO TABS
50.0000 mg | ORAL_TABLET | ORAL | Status: DC | PRN
  Administered 2020-12-23 – 2020-12-30 (×9): 50 mg via ORAL
  Filled 2020-12-23 (×10): qty 1

## 2020-12-23 NOTE — Progress Notes (Addendum)
Inpatient Rehab Admissions Coordinator:   I do not have a CIR bed for this Pt. Today. I provided Pt. With an estimate for daily room charge if he is to admit as self-pay. It remains in Pt.'s possession. I will continue to follow for potential CIR admit pending bed availability. I also reviewed cost estimate with pt.'s wife over the phone.   Clemens Catholic, [place], [place] Admissions Coordinator  [phone] ([place]) [phone] (office)

## 2020-12-23 NOTE — Progress Notes (Signed)
Occupational Therapy Treatment Patient Details Name: Miguel [name] MRN: [id number] DOB: Sep 20, 1955 Today's Date: 12/23/2020    History of present illness 65 yo male with onset of new L cerebellar and R thalamic infarcts was brought to ED, noted torticollis with no ins to get injection.  PMHx:  EtOH, pancreatitis, multi level spinal stenosis C-spine, ankylosed C2-C3, cervical disc endplate degeneration, HTN, atherosclerosis, torticollis   OT comments  Patient in recliner upon entry, agreeable to OT /PT session.  Patient completing LB dressing with min assist, transfers with min assist +2 for safety and grooming at sink with min guard assist for safety.  Pt with improved midline positioning with sitting and static standing tasks, but continues to have L lateral lean with functional mobility/dynamic tasks.  He is easily distracted and if distracted, easily looses his balance towards the L requiring up to max assist to correct. Requires cueing for safety throughout session, improving awareness to deficits.  Will follow.    Follow Up Recommendations  CIR    Equipment Recommendations  3 in 1 bedside commode    Recommendations for Other Services      Precautions / Restrictions Precautions Precautions: Fall Restrictions Weight Bearing Restrictions: No       Mobility Bed Mobility Overal bed mobility: Needs Assistance             General bed mobility comments: OOB in recliner upon entry    Transfers Overall transfer level: Needs assistance Equipment used: Rolling walker (2 wheeled);1 person hand held assist Transfers: Sit to/from Stand Sit to Stand: Min assist;+2 safety/equipment         General transfer comment: for safety, posture and balance    Balance Overall balance assessment: Needs assistance Sitting-balance support: Feet supported Sitting balance-Leahy Scale: Fair Sitting balance - Comments: supervision   Standing balance support: No upper extremity  supported;During functional activity;Bilateral upper extremity supported Standing balance-Leahy Scale: Poor Standing balance comment: static standing with min guard but dynamically relies on external and UE supprot                           ADL either performed or assessed with clinical judgement   ADL Overall ADL's : Needs assistance/impaired     Grooming: Oral care;Standing;Min guard               Lower Body Dressing: Minimal assistance;Sit to/from stand;+2 for safety/equipment   Toilet Transfer: Minimal assistance;+2 for safety/equipment;Ambulation;RW Toilet Transfer Details (indicate cue type and reason): simulated in room         Functional mobility during ADLs: Minimal assistance;+2 for safety/equipment;Rolling walker;Cueing for safety;Cueing for sequencing General ADL Comments: pt progressing well, improved midline centering with sitting/static standing tasks; continues with L lateral lean with mobility/dyanimc tasks and up to max assist required for LOB towards L with turns or when distracted     Vision       Perception     Praxis      Cognition Arousal/Alertness: Awake/alert Behavior During Therapy: Impulsive Overall Cognitive Status: Impaired/Different from baseline Area of Impairment: Safety/judgement;Awareness;Problem solving;Attention                   Current Attention Level: Sustained     Safety/Judgement: Decreased awareness of safety;Decreased awareness of deficits Awareness: Emergent Problem Solving: Slow processing;Decreased initiation;Difficulty sequencing;Requires verbal cues General Comments: pt requires sustained attention to task, unable to [facility] and is easily distracted, emerging awareness to deficits but impulsive at times. decreased  safety        Exercises     Shoulder Instructions       General Comments      Pertinent Vitals/ Pain       Pain Assessment: Faces Pain Score: 0-No pain Faces Pain Scale: No  hurt  Home Living                                          Prior Functioning/Environment              Frequency  Min 3X/week        Progress Toward Goals  OT Goals(current goals can now be found in the care plan section)  Progress towards OT goals: Progressing toward goals  Acute Rehab OT Goals Patient Stated Goal: to get stronger and be able to walk OT Goal Formulation: With patient  Plan Frequency remains appropriate;Discharge plan remains appropriate    Co-evaluation    PT/OT/SLP Co-Evaluation/Treatment: Yes Reason for Co-Treatment: For patient/therapist safety;To address functional/ADL transfers   OT goals addressed during session: ADL's and self-care      AM-PAC OT "6 Clicks" Daily Activity     Outcome Measure   Help from another person eating meals?: None Help from another person taking care of personal grooming?: A Little Help from another person toileting, which includes using toliet, bedpan, or urinal?: A Little Help from another person bathing (including washing, rinsing, drying)?: A Little Help from another person to put on and taking off regular upper body clothing?: A Little Help from another person to put on and taking off regular lower body clothing?: A Little 6 Click Score: 19    End of Session Equipment Utilized During Treatment: Gait belt;Rolling walker  OT Visit Diagnosis: Other abnormalities of gait and mobility (R26.89);History of falling (Z91.81);Dizziness and giddiness (R42)   Activity Tolerance Patient tolerated treatment well   Patient Left in chair;with call bell/phone within reach;with chair alarm set   Nurse Communication Mobility status        Time: 6606-3016 OT Time Calculation (min): 27 min  Charges: OT General Charges $OT Visit: 1 Visit OT Treatments $Self Care/Home Management : 8-22 mins  Jolaine Artist, OT [place] Pager [phone] Office [place] 12/23/2020, 3:43 PM

## 2020-12-23 NOTE — Progress Notes (Signed)
Physical Therapy Treatment Patient Details Name: Miguel [name] MRN: [id number] DOB: 1956/02/15 Today's Date: 12/23/2020    History of Present Illness 65 yo male with onset of new L cerebellar and R thalamic infarcts was brought to ED, noted torticollis with no ins to get injection.  PMHx:  EtOH, pancreatitis, multi level spinal stenosis C-spine, ankylosed C2-C3, cervical disc endplate degeneration, HTN, atherosclerosis, torticollis    PT Comments    Patient progressing towards physical therapy goals. Patient continues to require +2 for safety with OOB mobility. Patient requires min-modA+2 for ambulation with RW. Increased assistance required in open environment compared to closed and controlled environment. Difficulty multi-tasking during mobility. Improved ability to maintain midline statically but continues to demo L lateral lean with dynamic/mobility tasks. Continue to recommend comprehensive inpatient rehab (CIR) for post-acute therapy needs.     Follow Up Recommendations  CIR     Equipment Recommendations  None recommended by PT    Recommendations for Other Services       Precautions / Restrictions Precautions Precautions: Fall Restrictions Weight Bearing Restrictions: No    Mobility  Bed Mobility Overal bed mobility: Needs Assistance             General bed mobility comments: In recliner on arrival    Transfers Overall transfer level: Needs assistance Equipment used: Rolling Marvel Sapp (2 wheeled) Transfers: Sit to/from Stand Sit to Stand: Min assist;+2 safety/equipment         General transfer comment: minA for balance and +2 for safety  Ambulation/Gait Ambulation/Gait assistance: Min assist;Mod assist;+2 physical assistance;+2 safety/equipment Gait Distance (Feet): 70 Feet Assistive device: Rolling Maggie Dworkin (2 wheeled) Gait Pattern/deviations: Step-to pattern;Decreased stride length;Wide base of support;Ataxic;Staggering left Gait velocity: decreased    General Gait Details: difficulty navigating in open environment due to attention and inability to multi-task. Patient requiring increased assistance in open environment or during multi-tasking compared to closed and controlled environment. L lateral lean with cueing to correct   Stairs             Wheelchair Mobility    Modified Rankin (Stroke Patients Only) Modified Rankin (Stroke Patients Only) Pre-Morbid Rankin Score: No symptoms Modified Rankin: Moderately severe disability     Balance Overall balance assessment: Needs assistance Sitting-balance support: Feet supported Sitting balance-Leahy Scale: Fair Sitting balance - Comments: supervision Postural control: Left lateral lean Standing balance support: No upper extremity supported;During functional activity;Bilateral upper extremity supported Standing balance-Leahy Scale: Poor Standing balance comment: static standing with min guard but dynamically relies on external and UE supprot                            Cognition Arousal/Alertness: Awake/alert Behavior During Therapy: Impulsive Overall Cognitive Status: Impaired/Different from baseline Area of Impairment: Safety/judgement;Awareness;Problem solving;Attention                   Current Attention Level: Sustained     Safety/Judgement: Decreased awareness of safety;Decreased awareness of deficits Awareness: Emergent Problem Solving: Slow processing;Decreased initiation;Difficulty sequencing;Requires verbal cues General Comments: pt requires sustained attention to task, unable to [facility] and is easily distracted, emerging awareness to deficits but impulsive at times. decreased safety      Exercises      General Comments        Pertinent Vitals/Pain Pain Assessment: Faces Pain Score: 0-No pain Faces Pain Scale: No hurt Pain Intervention(s): Monitored during session    Home Living  Prior Function             PT Goals (current goals can now be found in the care plan section) Acute Rehab PT Goals Patient Stated Goal: to get stronger and be able to walk PT Goal Formulation: With patient/family Time For Goal Achievement: 01/01/21 Potential to Achieve Goals: Good Progress towards PT goals: Progressing toward goals    Frequency    Min 4X/week      PT Plan Current plan remains appropriate    Co-evaluation PT/OT/SLP Co-Evaluation/Treatment: Yes Reason for Co-Treatment: For patient/therapist safety;To address functional/ADL transfers PT goals addressed during session: Mobility/safety with mobility;Balance;Proper use of DME OT goals addressed during session: ADL's and self-care      AM-PAC PT "6 Clicks" Mobility   Outcome Measure  Help needed turning from your back to your side while in a flat bed without using bedrails?: A Little Help needed moving from lying on your back to sitting on the side of a flat bed without using bedrails?: A Little Help needed moving to and from a bed to a chair (including a wheelchair)?: A Lot Help needed standing up from a chair using your arms (e.g., wheelchair or bedside chair)?: A Little Help needed to walk in hospital room?: A Lot Help needed climbing 3-5 steps with a railing? : Total 6 Click Score: 14    End of Session Equipment Utilized During Treatment: Gait belt Activity Tolerance: Patient tolerated treatment well Patient left: in chair;with call bell/phone within reach;with chair alarm set Nurse Communication: Mobility status PT Visit Diagnosis: Unsteadiness on feet (R26.81);Ataxic gait (R26.0);Difficulty in walking, not elsewhere classified (R26.2)     Time: 8563-1497 PT Time Calculation (min) (ACUTE ONLY): 27 min  Charges:  $Gait Training: 8-22 mins                     Christinamarie Tall A. Gilford Rile PT, DPT Acute Rehabilitation Services Pager [phone] Office [phone]    Linna Hoff 12/23/2020, 4:58 PM

## 2020-12-23 NOTE — Progress Notes (Signed)
PROGRESS NOTE    Miguel [name]  NLG:[id number] DOB: 08-07-1956 DOA: 12/18/2020 PCP: Josetta Huddle, MD    Brief Narrative:  Brief Narrative:  Miguel [name] was admitted to the hospital with a working diagnosis of acute ischemic CVA.  65 year old male with past medical history for hypertension, depression, dyspepsia and anxiety who presented with dizziness and mechanical fall.  Reported 3 days of leaning to the left while walking, associated with dizziness.  He did sustain a head trauma while riding his lawnmower and hitting a tree branch about 3 days ago.  On his initial physical examination his blood pressure was 196/97, 108/100, heart rate 58, respiratory rate 16, temperature 98, oxygen saturation 98%, his lungs were clear to auscultation bilaterally, heart S1-S2, present, rhythmic, soft abdomen, no lower extremity edema.  Strength was preserved upper and lower extremities.  Head CT without significant acute changes.  Subtle asymmetric hypodensity in the central left cerebellum. Brain MRI with acute infarct left anterior and posterior cerebellum, small acute infarct right thalamus.  EKG 55 bpm, normal axis, normal intervals, sinus rhythm, no ST segment or T wave changes.  Patient with persistent disturbed balance, not back to his baseline, plan to continue therapy at in patient rehab.  Pending insurance authorization.    Assessment & Plan:   Principal Problem:   Acute CVA (cerebrovascular accident) [facility]) Active Problems:   Isolated cervical dystonia   Depression with anxiety   Essential hypertension   Alcohol use    1. Acute ischemic CVA left anterior and posterior cerebellum, right thalamic infarct.  His headache has improved with tramadol, but not completely resolved, nausea has improved, continue to have dizziness and poor balance with movement.   Plan to continue with clopidogrel and cilostazole for 3 weeks, then continue with clopidogrel alone.  Tolerating well atorvastatin  80 mg daily.   Follow with CIR for possible transfer.   2. Uncontrolled HTN. His blood pressure has improved this am down to 161/97.   On irbesartan and amlodipine, continue pain control with tramadol.  PRN hydralazine for systolic blood pressure greater than 190 mmHG.   3. Depression and alcohol abuse. On bupropion, venlafaxine and clonazepam. Continue with as needed lorazepam. GI prophylaxis with pantoprazole.   4. Headache/ mild improvement with tramadol, will increase frequency to as needed every 4 hrs, continue with PRN ibuprofen and acetaminophen.  5. Hyponatremia. Serum Na up to 133 yesterday, po intake has improved,    Status is: Inpatient  Remains inpatient appropriate because:IV treatments appropriate due to intensity of illness or inability to take PO   Dispo: The patient is from: Home              Anticipated d/c is to: CIR              Patient currently is not medically stable to d/c.   Difficult to place patient No   DVT prophylaxis: Enoxaparin   Code Status:   full  Family Communication:  No family at the bedside     Consultants:   Neurology    Subjective: Patient is feeling better, his headache has improved with tramadol, but not completely controlled, no nausea and improved po intake,. Continue to have dizziness and poor balance.   Objective: Vitals:   12/23/20 0039 12/23/20 0043 12/23/20 0400 12/23/20 0800  BP: (!) 168/106 (!) 164/99 (!) 163/91 (!) 161/97  Pulse: 98  88 89  Resp: 18  18 18   Temp: 98.5 F (36.9 C)  98 F (36.7 C)  97.8 F (36.6 C)  TempSrc: Oral  Oral Oral  SpO2: 100%  96% 96%  Weight:      Height:        Intake/Output Summary (Last 24 hours) at 12/23/2020 1050 Last data filed at 12/23/2020 0800 Gross per 24 hour  Intake --  Output 750 ml  Net -750 ml   Filed Weights   12/18/20 0339  Weight: 89.4 kg    Examination:   General: Not in pain or dyspnea, deconditioned  Neurology: Awake and alert, non focal  E  ENT: no pallor, no icterus, oral mucosa moist Cardiovascular: No JVD. S1-S2 present, rhythmic, no gallops, rubs, or murmurs. No lower extremity edema. Pulmonary: positive breath sounds bilaterally,  Gastrointestinal. Abdomen soft and non tender Skin. No rashes Musculoskeletal: no joint deformities     Data Reviewed: I have personally reviewed following labs and imaging studies  CBC: Recent Labs  Lab 12/18/20 0852 12/18/20 1136 12/19/20 0224  WBC 4.5 4.5 4.9  NEUTROABS 3.1  --   --   HGB 14.2 13.6 13.8  HCT 40.6 39.2 39.6  MCV 99.5 98.7 98.8  PLT 210 198 423   Basic Metabolic Panel: Recent Labs  Lab 12/18/20 0559 12/18/20 1136 12/19/20 0224 12/22/20 0359  NA 134*  --  132* 133*  K 4.0  --  3.7 4.1  CL 99  --  96* 95*  CO2 25  --  24 29  GLUCOSE 165*  --  147* 202*  BUN 9  --  7* 9  CREATININE 0.69 0.71 0.67 0.85  CALCIUM 9.5  --  9.5 10.2  MG  --  1.8  --   --   PHOS  --  2.6  --   --    GFR: Estimated Creatinine Clearance: 96.4 mL/min (by C-G formula based on SCr of 0.85 mg/dL). Liver Function Tests: Recent Labs  Lab 12/18/20 0559 12/19/20 0224  AST 22 18  ALT 25 21  ALKPHOS 59 56  BILITOT 0.7 0.8  PROT 6.9 6.7  ALBUMIN 4.2 4.0   No results for input(s): LIPASE, AMYLASE in the last 168 hours. No results for input(s): AMMONIA in the last 168 hours. Coagulation Profile: No results for input(s): INR, PROTIME in the last 168 hours. Cardiac Enzymes: No results for input(s): CKTOTAL, CKMB, CKMBINDEX, TROPONINI in the last 168 hours. BNP (last 3 results) No results for input(s): PROBNP in the last 8760 hours. HbA1C: No results for input(s): HGBA1C in the last 72 hours. CBG: No results for input(s): GLUCAP in the last 168 hours. Lipid Profile: No results for input(s): CHOL, HDL, LDLCALC, TRIG, CHOLHDL, LDLDIRECT in the last 72 hours. Thyroid Function Tests: No results for input(s): TSH, T4TOTAL, FREET4, T3FREE, THYROIDAB in the last 72 hours. Anemia  Panel: No results for input(s): VITAMINB12, FOLATE, FERRITIN, TIBC, IRON, RETICCTPCT in the last 72 hours.    Radiology Studies: I have reviewed all of the imaging during this hospital visit personally     Scheduled Meds: . amLODipine  10 mg Oral Daily  . atorvastatin  80 mg Oral Daily  . buPROPion  150 mg Oral Daily  . cilostazol  100 mg Oral BID  . clonazePAM  1 mg Oral Daily  . clopidogrel  75 mg Oral Daily  . enoxaparin (LOVENOX) injection  40 mg Subcutaneous Daily  . irbesartan  75 mg Oral Daily  . multivitamin with minerals  1 tablet Oral Daily  . venlafaxine XR  75 mg Oral Daily  Continuous Infusions:   LOS: 5 days        Brianni Manthe Gerome Apley, MD

## 2020-12-23 NOTE — H&P (Incomplete)
Physical Medicine and Rehabilitation Admission H&P    Chief Complaint  Patient presents with  . Fall  . Head Injury  : HPI: Miguel [name] is a 65 year old right-handed male with history of cervical dystonia and receives Xeomin injections in the past followed by neurology services Dr. Evelena Leyden at [facility], hypertension, depression, tobacco and alcohol use as well as pancreatitis 2005.  Per chart review patient lives with spouse independent prior to admission.  Two-level home half bath on main level and bathroom upstairs.  Patient recently lost his job 3 weeks ago.  Presented 12/18/2020 with persistent dizziness x1 week as well as left-sided ataxia.  Patient had reported on 12/16/2020 while riding his lawnmower and struck a tree without loss of consciousness.  Cranial CT scan showed no acute traumatic injury identified.  CT cervical spine no acute traumatic injury identified.  Patient did not receive tPA.  MRI showed acute infarct left inferior posterior cerebellum.  Small acute infarct right thalamus.  CT angiogram head and neck showed moderate to severe stenosis origin of the left vertebral artery.  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  Atherosclerosis disease in the carotid bifurcation bilaterally 50% diameter stenosis proximal right internal carotid artery.  No significant left carotid stenosis.  Echocardiogram with ejection fraction 45 to 50% mild global hypokinesis, mild MR without AS.  Admission chemistries unremarkable except sodium 134 glucose 165 urinalysis negative nitrite urine drug screen positive marijuana.  Presently maintained on Plavix for CVA prophylaxis as well as Pletal 100 mg twice daily x3 weeks then Plavix alone.  Subcutaneous Lovenox for DVT prophylaxis.  Maintain on a regular consistency diet.  Therapy evaluations completed due to patient decreased functional mobility he was admitted for a comprehensive rehabilitation program  Review of Systems   Constitutional: Negative for chills and fever.  HENT: Negative for hearing loss.   Eyes: Negative for blurred vision and double vision.  Respiratory: Negative for cough and shortness of breath.   Cardiovascular: Negative for chest pain, palpitations and leg swelling.  Gastrointestinal: Positive for constipation. Negative for heartburn, nausea and vomiting.  Genitourinary: Negative for dysuria, flank pain and hematuria.  Musculoskeletal: Positive for falls, myalgias and neck pain.  Neurological: Positive for dizziness.  Psychiatric/Behavioral: Positive for depression. The patient has insomnia.   All other systems reviewed and are negative.  Past Medical History:  Diagnosis Date  . Cervical dystonia   . Depression   . Erectile dysfunction due to arterial insufficiency   . Functional dyspepsia   . Hypertension   . Pancreatitis 2005   likely secondary to alcohol use  . Rosacea    Past Surgical History:  Procedure Laterality Date  . no past surgery     Family History  Problem Relation Age of Onset  . Uterine cancer Mother   . Emphysema Father    Social History:  reports that he has quit smoking. He has never used smokeless tobacco. He reports current alcohol use. He reports that he does not use drugs. Allergies:  Allergies  Allergen Reactions  . Aspirin Anaphylaxis    Childhood reaction   Medications Prior to Admission  Medication Sig Dispense Refill  . acetaminophen (TYLENOL) 500 MG tablet Take 500 mg by mouth every 6 (six) hours as needed for moderate pain or headache.    Marland Kitchen buPROPion (WELLBUTRIN SR) 150 MG 12 hr tablet TAKE 1 TABLET BY MOUTH ONCE DAILY 90 (Patient taking differently: Take 150 mg by mouth daily.) 90 tablet 3  .  Cholecalciferol (VITAMIN D) 2000 units CAPS Take 2,000 Units by mouth daily.    . clonazePAM (KLONOPIN) 1 MG tablet TAKE 1 TABLET BY MOUTH ONCE A DAY (Patient taking differently: Take 1 mg by mouth daily.) 90 tablet 1  . esomeprazole (NEXIUM) 40 MG  capsule Take 40 mg by mouth as needed (heartburn).    . Multiple Vitamin (MULTIVITAMIN) capsule Take 1 capsule by mouth daily.    . valsartan (DIOVAN) 80 MG tablet TAKE 1 TABLET BY MOUTH ONCE A DAY (Patient taking differently: Take 80 mg by mouth daily.) 90 tablet 3  . venlafaxine XR (EFFEXOR-XR) 75 MG 24 hr capsule TAKE 1 CAPSULE BY MOUTH ONCE A DAY (Patient taking differently: Take 75 mg by mouth daily.) 90 capsule 3  . IncobotulinumtoxinA (XEOMIN IM) Inject 300 Units into the muscle every 3 (three) months. (Patient not taking: No sig reported)      Drug Regimen Review Drug regimen was reviewed and remains appropriate with no significant issues identified  Home: Home Living Family/patient expects to be discharged to:: Private residence Living Arrangements: Spouse/significant other Available Help at Discharge: Family Type of Home: House Home Access: Stairs to enter Technical brewer of Steps: 1 Home Layout: Two level,1/2 bath on main level,Bed/bath upstairs Bathroom Shower/Tub: Multimedia programmer: Programmer, systems: Yes Home Equipment: None  Lives With: Spouse   Functional History: Prior Function Level of Independence: Independent Comments: independent ADLs, IADLs, driving; recently lost his job 3 weeks ago  Functional Status:  Mobility: Bed Mobility Overal bed mobility: Needs Assistance Bed Mobility: Rolling,Supine to Sit,Sit to Supine Rolling: Modified independent (Device/Increase time) Supine to sit: Supervision Sit to supine: Supervision General bed mobility comments: supervision for safety as I was not sure if his dizziness would be triggered by coming up to sitting EOB. Transfers Overall transfer level: Needs assistance Equipment used: Rolling walker (2 wheeled) Transfers: Sit to/from Stand Sit to Stand: Min assist,Mod assist Stand pivot transfers: +2 physical assistance,Min assist General transfer comment: Two person min assist to  come to standing EOB, wife also behind to make sure he does not hit his head if he falls backwards into bed.  Min assist to stand and stabilize, mod assist to help lower when pt reports his symptoms starting. Stood x 2 for ~45 seconds -1 min Ambulation/Gait Ambulation/Gait assistance: +2 physical assistance,Min assist,Max assist Gait Distance (Feet): 3 Feet Assistive device: Rolling walker (2 wheeled) Gait Pattern/deviations: Step-through pattern,Ataxic,Wide base of support General Gait Details: NT due to consistent sudden onset intense spinning in standing. Gait velocity: decreased    ADL: ADL Overall ADL's : Needs assistance/impaired Grooming: Set up,Sitting Upper Body Bathing: Set up,Sitting Lower Body Bathing: Minimal assistance,Sit to/from stand,+2 for safety/equipment,+2 for physical assistance Upper Body Dressing : Min guard,Sitting Lower Body Dressing: Minimal assistance,Sit to/from stand,+2 for safety/equipment Toilet Transfer: Minimal assistance,+2 for physical assistance,+2 for safety/equipment,Stand-pivot,RW,BSC Toilet Transfer Details (indicate cue type and reason): to [place] Toileting- Clothing Manipulation and Hygiene: Total assistance,+2 for safety/equipment,Sit to/from stand Toileting - Clothing Manipulation Details (indicate cue type and reason): assist from spouse, pt declines assist from thearpist Functional mobility during ADLs: Minimal assistance,+2 for physical assistance,+2 for safety/equipment,Rolling walker,Cueing for safety General ADL Comments: pt limited by [facility] this session with pt able to stand from EOB with minguard assist however pt became hypotensive abruptly returning to sitting, pt reports he never blacked out but did feel like he "flipped around 4 times" and vomitted. sat EOB ~ 10 mins to allow pt to recover. issued  pt light BUE therex with level 2 theraband  Cognition: Cognition Overall Cognitive Status: Within Functional Limits for tasks  assessed Arousal/Alertness: Awake/alert Orientation Level: Oriented X4 Attention: Sustained Sustained Attention: Appears intact Selective Attention: Impaired Selective Attention Impairment: Verbal basic Memory: Impaired Memory Impairment: Decreased short term memory Decreased Short Term Memory: Verbal basic Awareness: Impaired Awareness Impairment: Intellectual impairment (Shows signs of emergent awareness) Problem Solving: Appears intact Safety/Judgment: Impaired Cognition Arousal/Alertness: Awake/alert Behavior During Therapy: WFL for tasks assessed/performed Overall Cognitive Status: Within Functional Limits for tasks assessed Area of Impairment: Safety/judgement,Awareness Current Attention Level: Sustained Following Commands: Follows one step commands consistently,Follows multi-step commands with increased time Safety/Judgement: Decreased awareness of safety Awareness: Emergent Problem Solving: Slow processing,Requires verbal cues General Comments: pt is aware of deficits but has decreased awareness of safety  Physical Exam: Blood pressure 123/79, pulse (!) 108, temperature 98.6 F (37 C), resp. rate 18, height 6' (1.829 m), weight 89.4 kg, SpO2 95 %. Physical Exam Neurological:     Comments: Patient is alert no acute distress.  Makes eye contact with examiner.  Oriented x3 and follows commands.  Fair awareness of deficits.     No results found for this or any previous visit (from the past 48 hour(s)). No results found.     Medical Problem List and Plan: 1.  Limb truncal ataxia/central vestibular disorder secondary to acute left cerebellar and right thalamus ischemic infarct  -patient may *** shower  -ELOS/Goals: *** 2.  Antithrombotics: -DVT/anticoagulation: Lovenox  -antiplatelet therapy: Plavix 75 mg daily and Pletal 100 mg twice daily x3 weeks then Plavix alone 3. Pain Management: Tramadol as needed 4. Mood: Wellbutrin 150 mg daily, Klonopin 1 mg daily,  Effexor 75 mg daily, Ativan as needed  -antipsychotic agents: N/A 5. Neuropsych: This patient is capable of making decisions on his own behalf. 6. Skin/Wound Care: Routine skin checks 7. Fluids/Electrolytes/Nutrition: Routine in and outs with follow-up chemistries 8.  Hypertension.  Avapro 75 mg daily, Norvasc 10 mg daily.  Monitor with increased mobility 9.  Hyperlipidemia.  Lipitor 10.  Cervical dystonia.  Patient has been followed in the past by Dr. Evelena Leyden at [facility] and has received Xeomin in the past 11.  History of tobacco alcohol use.  Alcohol negative on admission.  Provide counseling 12.  BPH.  Flomax 0.4 mg daily. ***  Lavon Paganini Bilbo Carcamo, PA-C 12/30/2020

## 2020-12-24 DIAGNOSIS — I10 Essential (primary) hypertension: Secondary | ICD-10-CM | POA: Diagnosis not present

## 2020-12-24 DIAGNOSIS — F419 Anxiety disorder, unspecified: Secondary | ICD-10-CM | POA: Diagnosis not present

## 2020-12-24 DIAGNOSIS — Z7289 Other problems related to lifestyle: Secondary | ICD-10-CM | POA: Diagnosis not present

## 2020-12-24 DIAGNOSIS — F418 Other specified anxiety disorders: Secondary | ICD-10-CM | POA: Diagnosis not present

## 2020-12-24 MED ORDER — BISACODYL 5 MG PO TBEC
10.0000 mg | DELAYED_RELEASE_TABLET | Freq: Once | ORAL | Status: AC
  Administered 2020-12-24: 10 mg via ORAL
  Filled 2020-12-24: qty 2

## 2020-12-24 MED ORDER — TAMSULOSIN HCL 0.4 MG PO CAPS
0.4000 mg | ORAL_CAPSULE | Freq: Every day | ORAL | Status: DC
  Administered 2020-12-24 – 2020-12-30 (×7): 0.4 mg via ORAL
  Filled 2020-12-24 (×7): qty 1

## 2020-12-24 NOTE — Progress Notes (Signed)
PT Cancellation Note  Patient Details Name: Miguel [name] MRN: [id number] DOB: 04/10/1956   Cancelled Treatment:    Reason Eval/Treat Not Completed: Patient declined, no reason specified Patient politely declined therapy as he had just received Tramadol and states "I know what I can and can't do when I have taken it. I can't walk." PT will re-attempt as time allows.   Justine Cossin A. Gilford Rile PT, DPT Acute Rehabilitation Services Pager [phone] Office [phone]    Linna Hoff 12/24/2020, 2:43 PM

## 2020-12-24 NOTE — Progress Notes (Signed)
PROGRESS NOTE    Miguel [name]  MWN:[id number] DOB: 1956/02/05 DOA: 12/18/2020 PCP: Josetta Huddle, MD    Brief Narrative:  Mr. Mirsky was admitted to the hospital with a working diagnosis of acute ischemic CVA.  65 year old male with past medical history for hypertension, depression, dyspepsia and anxiety who presented with dizziness and mechanical fall. Reported 3 days of leaning to the left while walking, associated with dizziness. He did sustain a head trauma while riding his lawnmower and hitting a tree branch about 3 days ago. On his initial physical examination his blood pressure was 196/97, 108/100, heart rate 58, respiratory rate 16, temperature 98, oxygen saturation 98%, his lungswere clear to auscultation bilaterally, heart S1-S2, present, rhythmic, soft abdomen, no lower extremity edema.Strength was preserved upper and lower extremities.  Head CT without significant acute changes. Subtle asymmetric hypodensity in the central left cerebellum. Brain MRI with acute infarct left anterior and posterior cerebellum, small acute infarct right thalamus.  EKG 55 bpm, normal axis, normal intervals, sinus rhythm, no ST segment or T wave changes.  Patient with persistent disturbed balance, not back to his baseline, plan to continue therapy at in patient rehab.  Pending insurance authorization   Assessment & Plan:   Principal Problem:   Acute CVA (cerebrovascular accident) [facility]) Active Problems:   Isolated cervical dystonia   Depression with anxiety   Essential hypertension   Alcohol use    1. Acute ischemic CVA left anterior and posterior cerebellum, right thalamic infarct.  Slowly improving balance and dizziness, but not yet back to baseline, no nausea or vomiting and headache has improved with tramadol.   Tolerating well clopidogrel and cilostazole, plan to continue dual therapy  for 3 weeks, then continue with clopidogrel alone.  Continue with atorvastatin 80 mg daily.    Pending transfer to inpatient rehab.   2. Uncontrolled HTN.  Blood pressure today 136/95 mmHg. Continue with irbesartan and amlodipine.  3. Depression and alcohol abuse. Continue with bupropion, venlafaxine and clonazepam. No clinical signs of alcohol withdrawal, will discontinue as needed lorazepam.   On pantoprazole for GI prophylaxis.  4. Headache/continue pain control with acetaminophen, ibuprofen and tramadol.  5. Hyponatremia. will liberate diet, to improve Na intake, follow on renal function as outpatient Serum Na 133 on 04/19.   Status is: Inpatient  Remains inpatient appropriate because:Unsafe d/c plan   Dispo: The patient is from: Home              Anticipated d/c is to: CIR              Patient currently is medically stable to d/c.   Difficult to place patient No   DVT prophylaxis: Enoxaparin   Code Status:   full  Family Communication:  I spoke with patient's wife at the bedside, we talked in detail about patient's condition, plan of care and prognosis and all questions were addressed.      Nutrition Status:           Skin Documentation:     Consultants:   Neurology    Subjective: Patient is feeling better, headache, dizziness have been improving, but not yet back to baseline, continue to have difficulty ambulating.   Objective: Vitals:   12/23/20 2104 12/24/20 0038 12/24/20 0427 12/24/20 1153  BP: (!) 132/92 (!) 152/80 134/88 (!) 136/95  Pulse: 89 96 97 100  Resp: 18 18 18 16   Temp: 98.4 F (36.9 C) 97.8 F (36.6 C) 98 F (36.7 C) (!) 97.4 F (36.3  C)  TempSrc: Oral Oral Oral Oral  SpO2: 97% 95% 96% 97%  Weight:      Height:        Intake/Output Summary (Last 24 hours) at 12/24/2020 1205 Last data filed at 12/24/2020 0900 Gross per 24 hour  Intake 480 ml  Output 600 ml  Net -120 ml   Filed Weights   12/18/20 0339  Weight: 89.4 kg    Examination:   General: Not in pain or dyspnea, deconditioned  Neurology: Awake  and alert, non focal  E ENT: no pallor, no icterus, oral mucosa moist Cardiovascular: No JVD. S1-S2 present, rhythmic, no gallops, rubs, or murmurs. No lower extremity edema. Pulmonary: positive breath sounds bilaterally, adequate air movement, no wheezing, rhonchi or rales. Gastrointestinal. Abdomen soft and non tender Skin. No rashes Musculoskeletal: no joint deformities     Data Reviewed: I have personally reviewed following labs and imaging studies  CBC: Recent Labs  Lab 12/18/20 0852 12/18/20 1136 12/19/20 0224  WBC 4.5 4.5 4.9  NEUTROABS 3.1  --   --   HGB 14.2 13.6 13.8  HCT 40.6 39.2 39.6  MCV 99.5 98.7 98.8  PLT 210 198 109   Basic Metabolic Panel: Recent Labs  Lab 12/18/20 0559 12/18/20 1136 12/19/20 0224 12/22/20 0359  NA 134*  --  132* 133*  K 4.0  --  3.7 4.1  CL 99  --  96* 95*  CO2 25  --  24 29  GLUCOSE 165*  --  147* 202*  BUN 9  --  7* 9  CREATININE 0.69 0.71 0.67 0.85  CALCIUM 9.5  --  9.5 10.2  MG  --  1.8  --   --   PHOS  --  2.6  --   --    GFR: Estimated Creatinine Clearance: 96.4 mL/min (by C-G formula based on SCr of 0.85 mg/dL). Liver Function Tests: Recent Labs  Lab 12/18/20 0559 12/19/20 0224  AST 22 18  ALT 25 21  ALKPHOS 59 56  BILITOT 0.7 0.8  PROT 6.9 6.7  ALBUMIN 4.2 4.0   No results for input(s): LIPASE, AMYLASE in the last 168 hours. No results for input(s): AMMONIA in the last 168 hours. Coagulation Profile: No results for input(s): INR, PROTIME in the last 168 hours. Cardiac Enzymes: No results for input(s): CKTOTAL, CKMB, CKMBINDEX, TROPONINI in the last 168 hours. BNP (last 3 results) No results for input(s): PROBNP in the last 8760 hours. HbA1C: No results for input(s): HGBA1C in the last 72 hours. CBG: No results for input(s): GLUCAP in the last 168 hours. Lipid Profile: No results for input(s): CHOL, HDL, LDLCALC, TRIG, CHOLHDL, LDLDIRECT in the last 72 hours. Thyroid Function Tests: No results for  input(s): TSH, T4TOTAL, FREET4, T3FREE, THYROIDAB in the last 72 hours. Anemia Panel: No results for input(s): VITAMINB12, FOLATE, FERRITIN, TIBC, IRON, RETICCTPCT in the last 72 hours.    Radiology Studies: I have reviewed all of the imaging during this hospital visit personally     Scheduled Meds: . amLODipine  10 mg Oral Daily  . atorvastatin  80 mg Oral Daily  . buPROPion  150 mg Oral Daily  . cilostazol  100 mg Oral BID  . clonazePAM  1 mg Oral Daily  . clopidogrel  75 mg Oral Daily  . enoxaparin (LOVENOX) injection  40 mg Subcutaneous Daily  . irbesartan  75 mg Oral Daily  . multivitamin with minerals  1 tablet Oral Daily  . venlafaxine XR  75 mg Oral Daily   Continuous Infusions:   LOS: 6 days        Modupe Shampine Gerome Apley, MD

## 2020-12-24 NOTE — Progress Notes (Signed)
IP rehab admissions - we have no rehab bed available for this patient today.  I will have my partner follow up tomorrow for bed availability.  Call for questions.  [phone]

## 2020-12-24 NOTE — Plan of Care (Signed)
  Problem: Education: Goal: Knowledge of disease or condition will improve Outcome: Progressing Goal: Knowledge of secondary prevention will improve Outcome: Progressing Goal: Knowledge of patient specific risk factors addressed and post discharge goals established will improve Outcome: Progressing   

## 2020-12-25 DIAGNOSIS — I10 Essential (primary) hypertension: Secondary | ICD-10-CM | POA: Diagnosis not present

## 2020-12-25 DIAGNOSIS — F419 Anxiety disorder, unspecified: Secondary | ICD-10-CM | POA: Diagnosis not present

## 2020-12-25 DIAGNOSIS — F418 Other specified anxiety disorders: Secondary | ICD-10-CM | POA: Diagnosis not present

## 2020-12-25 DIAGNOSIS — Z7289 Other problems related to lifestyle: Secondary | ICD-10-CM | POA: Diagnosis not present

## 2020-12-25 LAB — CREATININE, SERUM
Creatinine, Ser: 0.8 mg/dL (ref 0.61–1.24)
GFR, Estimated: >60 mL/min (ref >60)

## 2020-12-25 NOTE — Progress Notes (Signed)
PROGRESS NOTE    Miguel [name]  JKD:[id number] DOB: May 26, 1956 DOA: 12/18/2020 PCP: Josetta Huddle, MD    Brief Narrative:  Miguel [name] was admitted to the hospital with a working diagnosis of acute ischemic CVA. Complicated with dizziness, poor balance and nausea.  65 year old male with past medical history for hypertension, depression, dyspepsia and anxiety who presented with dizziness and mechanical fall. Reported 3 days of leaning to the left while walking, associated with dizziness. He did sustain a head trauma while riding his lawnmower and hitting a tree branch about 3 days ago. On his initial physical examination his blood pressure was 196/97, 108/100, heart rate 58, respiratory rate 16, temperature 98, oxygen saturation 98%, his lungswere clear to auscultation bilaterally, heart S1-S2, present, rhythmic, soft abdomen, no lower extremity edema.Strength was preserved upper and lower extremities.  Head CT without significant acute changes. Subtle asymmetric hypodensity in the central left cerebellum. Brain MRI with acute infarct left anterior and posterior cerebellum, small acute infarct right thalamus.  EKG 55 bpm, normal axis, normal intervals, sinus rhythm, no ST segment or T wave changes.  Patient with persistent disturbed balance, headache and nausea not back to his baseline.  Today with orthostatic hypotension, continue dizziness and poor balance.   Assessment & Plan:   Principal Problem:   Acute CVA (cerebrovascular accident) [facility]) Active Problems:   Isolated cervical dystonia   Depression with anxiety   Essential hypertension   Alcohol use   1. Acute ischemic CVA left anterior and posterior cerebellum, right thalamic infarct. Patient continue to have dizziness and poor balance, positive orthostatics.   Continue withclopidogrel and cilostazole,dual therapy  for 3 weeks, then continue with clopidogrel alone. Tolerating well atorvastatin 80 mg daily.    Continue supportive medical therapy for headaches with tramadol and acetaminophen. Antiemetic with zofran.   Plan to transfer to inpatient rehab when medically more stable.    2. Uncontrolled HTN. Blood pressure today 139/95 mmHg. NEW Positive orthostatic hypotension when working with physical therapy. Plan to discontinue amlodipine and continue with irbesartan for now.   3. Depression and alcohol abuse.Onbupropion, venlafaxine and clonazepam.   Continue with pantoprazole for GI prophylaxis.  4. Hyponatremia. Diet has been liberated.   Patient continue to be at high risk for worsening orthostatic hypotension   Status is: Inpatient  Remains inpatient appropriate because:Inpatient level of care appropriate due to severity of illness   Dispo: The patient is from: Home              Anticipated d/c is to: CIR              Patient currently is not medically stable to d/c.   Difficult to place patient No   DVT prophylaxis: Enoxaparin   Code Status:   full  Family Communication:  I spoke with patient's wife at the bedside, we talked in detail about patient's condition, plan of care and prognosis and all questions were addressed.      Consultants:   Neurology     Subjective: Patient continue to have poor balance, and nausea. Positive orthostatic hypotension when doing PT.OT   Objective: Vitals:   12/25/20 0313 12/25/20 0314 12/25/20 0730 12/25/20 1121  BP: 135/90  (!) 140/93 (!) 139/95  Pulse: 83  93 (!) 109  Resp: 18  16 18   Temp: 98 F (36.7 C)  97.8 F (36.6 C) 97.9 F (36.6 C)  TempSrc: Oral  Oral Oral  SpO2: 92% 96% 100% 98%  Weight:  Height:        Intake/Output Summary (Last 24 hours) at 12/25/2020 1154 Last data filed at 12/24/2020 1226 Gross per 24 hour  Intake --  Output 700 ml  Net -700 ml   Filed Weights   12/18/20 0339  Weight: 89.4 kg    Examination:   General: Not in pain or dyspnea, deconditioned  Neurology: Awake and  alert, E ENT: no pallor, no icterus, oral mucosa moist Cardiovascular: No JVD. S1-S2 present, rhythmic, no gallops, rubs, or murmurs. No lower extremity edema. Pulmonary:  Positive breath sounds bilaterally, adequate air movement, no wheezing, rhonchi or rales. Gastrointestinal. Abdomen soft and non tender Skin. No rashes Musculoskeletal: no joint deformities     Data Reviewed: I have personally reviewed following labs and imaging studies  CBC: Recent Labs  Lab 12/19/20 0224  WBC 4.9  HGB 13.8  HCT 39.6  MCV 98.8  PLT 161   Basic Metabolic Panel: Recent Labs  Lab 12/19/20 0224 12/22/20 0359 12/25/20 0534  NA 132* 133*  --   K 3.7 4.1  --   CL 96* 95*  --   CO2 24 29  --   GLUCOSE 147* 202*  --   BUN 7* 9  --   CREATININE 0.67 0.85 0.80  CALCIUM 9.5 10.2  --    GFR: Estimated Creatinine Clearance: 102.4 mL/min (by C-G formula based on SCr of 0.8 mg/dL). Liver Function Tests: Recent Labs  Lab 12/19/20 0224  AST 18  ALT 21  ALKPHOS 56  BILITOT 0.8  PROT 6.7  ALBUMIN 4.0   No results for input(s): LIPASE, AMYLASE in the last 168 hours. No results for input(s): AMMONIA in the last 168 hours. Coagulation Profile: No results for input(s): INR, PROTIME in the last 168 hours. Cardiac Enzymes: No results for input(s): CKTOTAL, CKMB, CKMBINDEX, TROPONINI in the last 168 hours. BNP (last 3 results) No results for input(s): PROBNP in the last 8760 hours. HbA1C: No results for input(s): HGBA1C in the last 72 hours. CBG: No results for input(s): GLUCAP in the last 168 hours. Lipid Profile: No results for input(s): CHOL, HDL, LDLCALC, TRIG, CHOLHDL, LDLDIRECT in the last 72 hours. Thyroid Function Tests: No results for input(s): TSH, T4TOTAL, FREET4, T3FREE, THYROIDAB in the last 72 hours. Anemia Panel: No results for input(s): VITAMINB12, FOLATE, FERRITIN, TIBC, IRON, RETICCTPCT in the last 72 hours.    Radiology Studies: I have reviewed all of the imaging  during this hospital visit personally     Scheduled Meds: . amLODipine  10 mg Oral Daily  . atorvastatin  80 mg Oral Daily  . buPROPion  150 mg Oral Daily  . cilostazol  100 mg Oral BID  . clonazePAM  1 mg Oral Daily  . clopidogrel  75 mg Oral Daily  . enoxaparin (LOVENOX) injection  40 mg Subcutaneous Daily  . irbesartan  75 mg Oral Daily  . multivitamin with minerals  1 tablet Oral Daily  . tamsulosin  0.4 mg Oral QPC supper  . venlafaxine XR  75 mg Oral Daily   Continuous Infusions:   LOS: 7 days        Iyona Pehrson Gerome Apley, MD

## 2020-12-25 NOTE — Progress Notes (Signed)
  Speech Language Pathology Treatment: Cognitive-Linquistic  Patient Details Name: Miguel [name] MRN: [id number] DOB: [birth year] Today's Date: 12/25/2020 Time: 0318-0328 SLP Time Calculation (min) (ACUTE ONLY): 10 min  Assessment / Plan / Recommendation Clinical Impression  Pt initially reluctant to participate in skilled cognitive treatment, but ultimately consented. Trained in use of categorization/chunking and auditory rehearsal as internal memory strategies to increase recall of functional word lists.  When given 3 lists of 3 related words, pt utilized strategies to recall 7/9 words after 2-3 minute delay given minimal choice and semantic cues. Will f/u for further training in short term memory and additional needs as indicated in initial evaluation.   HPI HPI: Pt is a 65 y/o male with PMHx:  EtOH, pancreatitis, multi level spinal stenosis C-spine, ankylosed C2-C3, cervical disc endplate degeneration, HTN, atherosclerosis, torticollis. Pt presented to the ED on 4/15 after nausea, vomiting, dizziness and a tendency to veer to his left when he is trying to walk across multiple days. MRI brain 4/15: Acute infarct left inferior and posterior cerebellum. Small acute  infarct right thalamus.Acute infarct left inferior and posterior cerebellum. Small acute infarct right thalamus. CTA 4/15: Moderate to severe stenosis origin of left vertebral artery. 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      SLP Plan  Continue with current plan of care       Recommendations                   SLP Visit Diagnosis: Cognitive communication deficit (W10.932) Plan: Continue with current plan of care       [place], [place], [place] Office Number: [phone]   Acie Fredrickson 12/25/2020, 3:40 PM

## 2020-12-25 NOTE — Progress Notes (Signed)
Occupational Therapy Treatment Patient Details Name: Miguel [name] MRN: [id number] DOB: Feb 01, 1956 Today's Date: 12/25/2020    History of present illness 65 yo male with onset of new L cerebellar and R thalamic infarcts was brought to ED, noted torticollis with no ins to get injection.  PMHx:  EtOH, pancreatitis, multi level spinal stenosis C-spine, ankylosed C2-C3, cervical disc endplate degeneration, HTN, atherosclerosis, torticollis   OT comments  Pt limited by orthostatic hypotension this session, see vitals below. Pt agreeable PT/OT co treat,however after sit<>stand from EOB ~ 5 secs pt noted to begin counting down backwards from 4 and quickly returned self to sitting heavily leaning to R side, Pt reports he never fully "blacked out" but did feel like he was flipped around a couple times. Pt also vomited after episode. Pt needed ~8 mins EOB to recover with RN entering to assess pt. Issued pt BUE HEP with level 2 theraband to be completed in supine until BP issues resolve. Pt would continue to benefit from skilled occupational therapy while admitted and after d/c to address the below listed limitations in order to improve overall functional mobility and facilitate independence with BADL participation. DC plan remains appropriate, will follow acutely per POC.   151/92 HR 109 EOB- 143/95 HR 109 Stand: 109/74 HR 119 Sit x 3 minutes: 170/115 HR 126 Sit x 5 minutes: 151/100 HR 116  Follow Up Recommendations  CIR    Equipment Recommendations  3 in 1 bedside commode    Recommendations for Other Services      Precautions / Restrictions Precautions Precautions: Fall Precaution Comments: orthostatic Restrictions Weight Bearing Restrictions: No       Mobility Bed Mobility Overal bed mobility: Needs Assistance Bed Mobility: Supine to Sit;Sit to Supine     Supine to sit: Supervision;HOB elevated Sit to supine: Supervision;HOB elevated   General bed mobility comments: supervision for  safety, increased time to return to supine d/t reports of dizziness with positional changes    Transfers Overall transfer level: Needs assistance Equipment used: Rolling walker (2 wheeled) Transfers: Sit to/from Stand Sit to Stand: Min guard;+2 safety/equipment         General transfer comment: minguard +2 for safety to rise from EOB    Balance Overall balance assessment: Needs assistance Sitting-balance support: Feet supported;No upper extremity supported Sitting balance-Leahy Scale: Fair Sitting balance - Comments: pt initially sitting EOB with no UE support with supervision however during hypotensive event pt leaning laterally to R needing MAX A for support Postural control: Right lateral lean Standing balance support: Single extremity supported Standing balance-Leahy Scale: Poor Standing balance comment: static standing briefly( ~ 5 secs) with Rw and min guard for safety before pt becoming orthostatic returning self to sitting                           ADL either performed or assessed with clinical judgement   ADL Overall ADL's : Needs assistance/impaired                                     Functional mobility during ADLs: Min guard;+2 for physical assistance;+2 for safety/equipment;Maximal assistance;Rolling walker (sit<>stand from EOB; up to MAX A d/t R lateral lean with hypotension) General ADL Comments: pt limited by [facility] this session with pt able to stand from EOB with minguard assist however pt became hypotensive abruptly returning to sitting, pt reports  he never blacked out but did feel like he "flipped around 4 times" and vomitted. sat EOB ~ 10 mins to allow pt to recover. issued pt light BUE therex with level 2 theraband     Vision       Perception     Praxis      Cognition Arousal/Alertness: Awake/alert Behavior During Therapy: WFL for tasks assessed/performed;Flat affect (flat affect after hypotensive event) Overall Cognitive  Status: Impaired/Different from baseline Area of Impairment: Attention;Awareness;Problem solving;Following commands                   Current Attention Level: Sustained   Following Commands: Follows one step commands consistently;Follows multi-step commands with increased time   Awareness: Emergent Problem Solving: Slow processing;Requires verbal cues General Comments: pt pleasant and agreeable to session, once pt in standing pt noted to count back wards from 4 and was able to anticipate hypotensive event, pt much more flat after episode needing time to recover.        Exercises General Exercises - Upper Extremity Shoulder Flexion: Strengthening;Both;5 reps;Supine;Theraband Theraband Level (Shoulder Flexion): Level 2 (Red) Shoulder Extension: Strengthening;Both;5 reps;Supine;Theraband Theraband Level (Shoulder Extension): Level 2 (Red) Shoulder Horizontal ABduction: Strengthening;Both;5 reps;Supine;Theraband Theraband Level (Shoulder Horizontal Abduction): Level 2 (Red) Shoulder Horizontal ADduction: Strengthening;Both;5 reps;Supine;Theraband Theraband Level (Shoulder Horizontal Adduction): Level 2 (Red) Elbow Flexion: Strengthening;Both;5 reps;Supine;Theraband Theraband Level (Elbow Flexion): Level 2 (Red) Elbow Extension: Strengthening;Both;5 reps;Supine;Theraband Theraband Level (Elbow Extension): Level 2 (Red) Other Exercises Other Exercises: alternating punches with BUEs from supine with level 2 theraband   Shoulder Instructions       General Comments spouse present at end of session, 151/92 HR 109  EOB- 143/95 HR 109  Stand: 109/74 HR 119  Sit x 3 minutes: 170/115 HR 126  Sit x 5 minutes: 151/100 HR 116. issued pt written BUE HEP and level 2 theraband    Pertinent Vitals/ Pain       Pain Assessment: Faces Faces Pain Scale: Hurts little more Pain Location: general discomfort after hypotension episode Pain Descriptors / Indicators: Discomfort;Grimacing Pain  Intervention(s): Limited activity within patient's tolerance;Monitored during session;Repositioned  Home Living                                          Prior Functioning/Environment              Frequency  Min 3X/week        Progress Toward Goals  OT Goals(current goals can now be found in the care plan section)  Progress towards OT goals: Progressing toward goals  Acute Rehab OT Goals Patient Stated Goal: to not be dizzy OT Goal Formulation: With patient Time For Goal Achievement: 01/02/21 Potential to Achieve Goals: Good  Plan Frequency remains appropriate;Discharge plan remains appropriate    Co-evaluation      Reason for Co-Treatment: For patient/therapist safety;To address functional/ADL transfers          AM-PAC OT "6 Clicks" Daily Activity     Outcome Measure   Help from another person eating meals?: None Help from another person taking care of personal grooming?: A Little Help from another person toileting, which includes using toliet, bedpan, or urinal?: A Lot Help from another person bathing (including washing, rinsing, drying)?: A Lot Help from another person to put on and taking off regular upper body clothing?: A Little Help from another person to put on  and taking off regular lower body clothing?: A Lot 6 Click Score: 16    End of Session Equipment Utilized During Treatment: Gait belt;Rolling walker;Other (comment) (level 2 theraband)  OT Visit Diagnosis: Other abnormalities of gait and mobility (R26.89);History of falling (Z91.81);Dizziness and giddiness (R42)   Activity Tolerance Other (comment) (limited by [facility])   Patient Left in bed;with call bell/phone within reach;with bed alarm set;with family/visitor present   Nurse Communication Mobility status        Time: 0177-9390 OT Time Calculation (min): 49 min  Charges: OT General Charges $OT Visit: 1 Visit OT Treatments $Therapeutic Activity: 8-22 mins  Harley Alto., COTA/L Acute Rehabilitation Services [place] 12/25/2020, 4:00 PM

## 2020-12-25 NOTE — Progress Notes (Signed)
Physical Therapy Treatment Patient Details Name: Miguel [name] MRN: [id number] DOB: 1956-06-09 Today's Date: 12/25/2020    History of Present Illness 65 yo male with onset of new L cerebellar and R thalamic infarcts was brought to ED, noted torticollis with no ins to get injection.  PMHx:  EtOH, pancreatitis, multi level spinal stenosis C-spine, ankylosed C2-C3, cervical disc endplate degeneration, HTN, atherosclerosis, torticollis    PT Comments    Session limited by orthostasis, see below. Patient performed sit to stand with min guard +2 from EOB and within ~5 seconds, patient noted to begin counting down backwards from 4 and quickly returned self to sitting with heavy lean to R side. Patient reports feeling like he was flipped upside down a couple of times but he never fully "blacked out". Vomiting occurred following episode, notified RN. RN in to assess patient. Provided B LE HEP to patient with level 2 theraband to complete in standing. Educated wife and patient to defer from transferring to chair or [facility] this date due to orthostatics until reassessed by RN. Continue to recommend comprehensive inpatient rehab (CIR) for post-acute therapy needs.   Orthostatic BPs  Supine 151/92 HR 109  Sitting 143/95 HR 109  Standing 109/74 HR 119  Sitting after 3 min 170/115 HR 126  Sitting after 5 min 151/100 HR 116  Sitting after 10 min 119/91 HR 111      Follow Up Recommendations  CIR     Equipment Recommendations  None recommended by PT    Recommendations for Other Services       Precautions / Restrictions Precautions Precautions: Fall Precaution Comments: orthostatic Restrictions Weight Bearing Restrictions: No    Mobility  Bed Mobility Overal bed mobility: Needs Assistance Bed Mobility: Supine to Sit;Sit to Supine     Supine to sit: Supervision;HOB elevated Sit to supine: Supervision;HOB elevated   General bed mobility comments: supervision for safety, increased time to return  to supine d/t reports of dizziness with positional changes    Transfers Overall transfer level: Needs assistance Equipment used: Rolling Tacara Hadlock (2 wheeled) Transfers: Sit to/from Stand Sit to Stand: Min guard;+2 safety/equipment         General transfer comment: minguard +2 for safety to rise from EOB  Ambulation/Gait                 Stairs             Wheelchair Mobility    Modified Rankin (Stroke Patients Only) Modified Rankin (Stroke Patients Only) Pre-Morbid Rankin Score: No symptoms Modified Rankin: Moderately severe disability     Balance Overall balance assessment: Needs assistance Sitting-balance support: Feet supported;No upper extremity supported Sitting balance-Leahy Scale: Fair Sitting balance - Comments: pt initially sitting EOB with no UE support with supervision however during hypotensive event pt leaning laterally to R needing MAX A for support Postural control: Right lateral lean Standing balance support: Single extremity supported Standing balance-Leahy Scale: Poor Standing balance comment: static standing briefly( ~ 5 secs) with Rw and min guard for safety before pt becoming orthostatic returning self to sitting                            Cognition Arousal/Alertness: Awake/alert Behavior During Therapy: WFL for tasks assessed/performed;Flat affect Overall Cognitive Status: Impaired/Different from baseline Area of Impairment: Attention;Awareness;Problem solving;Following commands                   Current Attention Level: Sustained  Following Commands: Follows one step commands consistently;Follows multi-step commands with increased time   Awareness: Emergent Problem Solving: Slow processing;Requires verbal cues General Comments: pt pleasant and agreeable to session, once pt in standing pt noted to count back wards from 4 and was able to anticipate hypotensive event, pt much more flat after episode needing time to  recover.      Exercises General Exercises - Upper Extremity Shoulder Flexion: Strengthening;Both;5 reps;Supine;Theraband Theraband Level (Shoulder Flexion): Level 2 (Red) Shoulder Extension: Strengthening;Both;5 reps;Supine;Theraband Theraband Level (Shoulder Extension): Level 2 (Red) Shoulder Horizontal ABduction: Strengthening;Both;5 reps;Supine;Theraband Theraband Level (Shoulder Horizontal Abduction): Level 2 (Red) Shoulder Horizontal ADduction: Strengthening;Both;5 reps;Supine;Theraband Theraband Level (Shoulder Horizontal Adduction): Level 2 (Red) Elbow Flexion: Strengthening;Both;5 reps;Supine;Theraband Theraband Level (Elbow Flexion): Level 2 (Red) Elbow Extension: Strengthening;Both;5 reps;Supine;Theraband Theraband Level (Elbow Extension): Level 2 (Red) General Exercises - Lower Extremity Long Arc Quad: Both;5 reps;Supine Hip ABduction/ADduction: Both;5 reps;Supine Straight Leg Raises: Both;5 reps;Supine Hip Flexion/Marching: AROM;Strengthening;Both;5 reps;Supine (with orange theraband) Other Exercises Other Exercises: supine clamshells with orange theraband    General Comments General comments (skin integrity, edema, etc.): spouse present at end of session, 151/92 HR 109  EOB- 143/95 HR 109  Stand: 109/74 HR 119  Sit x 3 minutes: 170/115 HR 126  Sit x 5 minutes: 151/100 HR 116. issued patient written B LE HEP and level 2 theraband      Pertinent Vitals/Pain Pain Assessment: Faces Faces Pain Scale: Hurts little more Pain Location: general discomfort after hypotension episode Pain Descriptors / Indicators: Discomfort;Grimacing Pain Intervention(s): Monitored during session;Limited activity within patient's tolerance;Repositioned    Home Living                      Prior Function            PT Goals (current goals can now be found in the care plan section) Acute Rehab PT Goals Patient Stated Goal: to not be dizzy PT Goal Formulation: With  patient/family Time For Goal Achievement: 01/01/21 Potential to Achieve Goals: Good Progress towards PT goals: Progressing toward goals    Frequency    Min 4X/week      PT Plan Current plan remains appropriate    Co-evaluation PT/OT/SLP Co-Evaluation/Treatment: Yes Reason for Co-Treatment: For patient/therapist safety;To address functional/ADL transfers PT goals addressed during session: Mobility/safety with mobility;Balance;Strengthening/ROM        AM-PAC PT "6 Clicks" Mobility   Outcome Measure  Help needed turning from your back to your side while in a flat bed without using bedrails?: A Little Help needed moving from lying on your back to sitting on the side of a flat bed without using bedrails?: A Little Help needed moving to and from a bed to a chair (including a wheelchair)?: A Lot Help needed standing up from a chair using your arms (e.g., wheelchair or bedside chair)?: A Little Help needed to walk in hospital room?: A Lot Help needed climbing 3-5 steps with a railing? : Total 6 Click Score: 14    End of Session Equipment Utilized During Treatment: Gait belt Activity Tolerance: Treatment limited secondary to medical complications (Comment) (orthostatics) Patient left: in bed;with call bell/phone within reach;with bed alarm set;with family/visitor present Nurse Communication: Mobility status;Other (comment) (orthostasis) PT Visit Diagnosis: Unsteadiness on feet (R26.81);Ataxic gait (R26.0);Difficulty in walking, not elsewhere classified (R26.2)     Time: [id number] PT Time Calculation (min) (ACUTE ONLY): 50 min  Charges:  $Therapeutic Activity: 23-37 mins  Jenefer Woerner A. Gilford Rile PT, DPT Acute Rehabilitation Services Pager [phone] Office [phone]    Linna Hoff 12/25/2020, 4:30 PM

## 2020-12-25 NOTE — Progress Notes (Signed)
Inpatient Rehab Admissions Coordinator:    I do not have a bed on CIR for this patient today. Following discussion with rehab MD, it is felt that we would be unlikely to be able to get retroactive authorization for CIR, so we weill have to wait for Pt.'s COBRA policy to go into effect (anticipated Monday-Thursday of next week)/ I will continue to follow for potential admit pending insurance auth and bed availability.   Clemens Catholic, [place], [place] Admissions Coordinator  [phone] ([place]) [phone] (office)

## 2020-12-25 NOTE — Progress Notes (Signed)
Pt noted to have 333 mL w/ bladder scan. Pt stated that he needs more time to urinate. Dr. Cyd Silence updated.

## 2020-12-25 NOTE — Plan of Care (Signed)
  Problem: Education: Goal: Knowledge of disease or condition will improve Outcome: Progressing Goal: Knowledge of secondary prevention will improve Outcome: Progressing Goal: Knowledge of patient specific risk factors addressed and post discharge goals established will improve Outcome: Progressing   

## 2020-12-26 DIAGNOSIS — I10 Essential (primary) hypertension: Secondary | ICD-10-CM | POA: Diagnosis not present

## 2020-12-26 DIAGNOSIS — Z7289 Other problems related to lifestyle: Secondary | ICD-10-CM | POA: Diagnosis not present

## 2020-12-26 DIAGNOSIS — F419 Anxiety disorder, unspecified: Secondary | ICD-10-CM | POA: Diagnosis not present

## 2020-12-26 DIAGNOSIS — F418 Other specified anxiety disorders: Secondary | ICD-10-CM | POA: Diagnosis not present

## 2020-12-26 MED ORDER — POLYETHYLENE GLYCOL 3350 17 G PO PACK
17.0000 g | PACK | Freq: Two times a day (BID) | ORAL | Status: DC
  Administered 2020-12-26 – 2020-12-28 (×2): 17 g via ORAL
  Filled 2020-12-26 (×5): qty 1

## 2020-12-26 MED ORDER — MECLIZINE HCL 12.5 MG PO TABS
25.0000 mg | ORAL_TABLET | Freq: Two times a day (BID) | ORAL | Status: DC | PRN
  Administered 2020-12-26 – 2020-12-30 (×6): 25 mg via ORAL
  Filled 2020-12-26 (×8): qty 2

## 2020-12-26 NOTE — Plan of Care (Signed)
  Problem: Ischemic Stroke/TIA Tissue Perfusion: Goal: Complications of ischemic stroke/TIA will be minimized Outcome: Progressing   Problem: Education: Goal: Knowledge of disease or condition will improve 12/26/2020 1057 by Dorris Carnes, RN Outcome: Progressing 12/26/2020 1056 by Dorris Carnes, RN Outcome: Progressing Goal: Knowledge of secondary prevention will improve 12/26/2020 1057 by Dorris Carnes, RN Outcome: Progressing 12/26/2020 1056 by Dorris Carnes, RN Outcome: Progressing Goal: Knowledge of patient specific risk factors addressed and post discharge goals established will improve 12/26/2020 1057 by Dorris Carnes, RN Outcome: Progressing 12/26/2020 1056 by Dorris Carnes, RN Outcome: Progressing

## 2020-12-26 NOTE — Progress Notes (Signed)
PROGRESS NOTE    Miguel [name]  RXV:[id number] DOB: 1955-11-13 DOA: 12/18/2020 PCP: Josetta Huddle, MD    Brief Narrative:  Miguel [name] was admitted to the hospital with a working diagnosis of acute ischemic CVA. Complicated with dizziness, poor balance and nausea.  65 year old male with past medical history for hypertension, depression, dyspepsia and anxiety who presented with dizziness and mechanical fall. Reported 3 days of leaning to the left while walking, associated with dizziness. He did sustain a head trauma while riding his lawnmower and hitting a tree branch about 3 days ago. On his initial physical examination his blood pressure was 196/97, 108/100, heart rate 58, respiratory rate 16, temperature 98, oxygen saturation 98%, his lungswere clear to auscultation bilaterally, heart S1-S2, present, rhythmic, soft abdomen, no lower extremity edema.Strength was preserved upper and lower extremities.  Head CT without significant acute changes. Subtle asymmetric hypodensity in the central left cerebellum. Brain MRI with acute infarct left anterior and posterior cerebellum, small acute infarct right thalamus.  EKG 55 bpm, normal axis, normal intervals, sinus rhythm, no ST segment or T wave changes.  Patient with persistent disturbed balance, headache and nausea not back to his baseline.  Positive orthostatic hypotension and veritgo, with poor balance and nausea.  Pending transfer to inpatient rehab.    Assessment & Plan:   Principal Problem:   Acute CVA (cerebrovascular accident) [facility]) Active Problems:   Isolated cervical dystonia   Depression with anxiety   Essential hypertension   Alcohol use   1. Acute ischemic CVA left anterior and posterior cerebellum, right thalamic infarct. Persistent vertigo and ambulatory dysfunction, nausea has improved.   Plan to continue with dual antiplatelet therapy with clopidogrel and cilostazole,for 3 weeks, then continue with  clopidogrel alone. Continue with atorvastatin 80 mg daily.   Supportive medical therapy with tramadol, zofran and added meclizine.  Continue PT and OT, pending transfer to inpatient rehab.   2. Uncontrolled HTN. Positive orthostatic hypotension, will continue monotherapy for blood pressure control with irbersartan.  Blood pressure this am 140/88 mmHg.   3. Depression and alcohol abuse.Continue withbupropion, venlafaxine and clonazepam.   On pantoprazole for GI prophylaxis.  4. Hyponatremia. Tolerating po well, no further nausea or vomiting.    Status is: Inpatient  Remains inpatient appropriate because:Unsafe d/c plan   Dispo: The patient is from: Home              Anticipated d/c is to: CIR              Patient currently is medically stable to d/c.   Difficult to place patient No   DVT prophylaxis: Enoxaparin   Code Status:   full  Family Communication:  I spoke with patient's wife at the bedside, we talked in detail about patient's condition, plan of care and prognosis and all questions were addressed.      Consultants:   Neurology     Subjective: Patient is feeling vertigo with rapid head movements, no nausea or vomiting, poor balance, headache has improved.   Objective: Vitals:   12/25/20 2105 12/26/20 0010 12/26/20 0445 12/26/20 0808  BP: 138/79 (!) 132/97 (!) 153/88 140/88  Pulse: (!) 104 (!) 104 100 (!) 101  Resp: 18 18 18 16   Temp: 98.1 F (36.7 C) 98.2 F (36.8 C) 97.8 F (36.6 C) 97.9 F (36.6 C)  TempSrc: Oral Oral Oral Oral  SpO2: 94% 96% 94% 97%  Weight:      Height:        Intake/Output  Summary (Last 24 hours) at 12/26/2020 1040 Last data filed at 12/25/2020 1800 Gross per 24 hour  Intake --  Output 900 ml  Net -900 ml   Filed Weights   12/18/20 0339  Weight: 89.4 kg    Examination:   General: Not in pain or dyspnea, deconditioned  Neurology: Awake and alert, non focal  E ENT: mild pallor, no icterus, oral mucosa  moist Cardiovascular: No JVD. S1-S2 present, rhythmic, no gallops, rubs, or murmurs. No lower extremity edema. Pulmonary: positive breath sounds bilaterally, adequate air movement, no wheezing, rhonchi or rales. Gastrointestinal. Abdomen soft and non tender Skin. No rashes Musculoskeletal: no joint deformities     Data Reviewed: I have personally reviewed following labs and imaging studies  CBC: No results for input(s): WBC, NEUTROABS, HGB, HCT, MCV, PLT in the last 168 hours. Basic Metabolic Panel: Recent Labs  Lab 12/22/20 0359 12/25/20 0534  NA 133*  --   K 4.1  --   CL 95*  --   CO2 29  --   GLUCOSE 202*  --   BUN 9  --   CREATININE 0.85 0.80  CALCIUM 10.2  --    GFR: Estimated Creatinine Clearance: 102.4 mL/min (by C-G formula based on SCr of 0.8 mg/dL). Liver Function Tests: No results for input(s): AST, ALT, ALKPHOS, BILITOT, PROT, ALBUMIN in the last 168 hours. No results for input(s): LIPASE, AMYLASE in the last 168 hours. No results for input(s): AMMONIA in the last 168 hours. Coagulation Profile: No results for input(s): INR, PROTIME in the last 168 hours. Cardiac Enzymes: No results for input(s): CKTOTAL, CKMB, CKMBINDEX, TROPONINI in the last 168 hours. BNP (last 3 results) No results for input(s): PROBNP in the last 8760 hours. HbA1C: No results for input(s): HGBA1C in the last 72 hours. CBG: No results for input(s): GLUCAP in the last 168 hours. Lipid Profile: No results for input(s): CHOL, HDL, LDLCALC, TRIG, CHOLHDL, LDLDIRECT in the last 72 hours. Thyroid Function Tests: No results for input(s): TSH, T4TOTAL, FREET4, T3FREE, THYROIDAB in the last 72 hours. Anemia Panel: No results for input(s): VITAMINB12, FOLATE, FERRITIN, TIBC, IRON, RETICCTPCT in the last 72 hours.    Radiology Studies: I have reviewed all of the imaging during this hospital visit personally     Scheduled Meds: . atorvastatin  80 mg Oral Daily  . buPROPion  150 mg Oral  Daily  . cilostazol  100 mg Oral BID  . clonazePAM  1 mg Oral Daily  . clopidogrel  75 mg Oral Daily  . enoxaparin (LOVENOX) injection  40 mg Subcutaneous Daily  . irbesartan  75 mg Oral Daily  . multivitamin with minerals  1 tablet Oral Daily  . tamsulosin  0.4 mg Oral QPC supper  . venlafaxine XR  75 mg Oral Daily   Continuous Infusions:   LOS: 8 days        Chantale Leugers Gerome Apley, MD

## 2020-12-26 NOTE — Progress Notes (Addendum)
Offered to do bladder scan on patient since he has not urinated. Patient refused, and wife states he will try to go on his own. Educated patient on the importance of not holding urine for too long. Wife states Dr. Cathlean Sauer do not want staff to do in and out cath if patient had excess urine in bladder. No distress noted at this time, patient denies dizziness. Will continue to monitor.

## 2020-12-26 NOTE — Progress Notes (Signed)
Physical Therapy Treatment Patient Details Name: Miguel [name] MRN: [id number] DOB: 1956/05/12 Today's Date: 12/26/2020    History of Present Illness 65 yo male with onset of new L cerebellar and R thalamic infarcts was brought to ED, noted torticollis with no ins to get injection.  PMHx:  EtOH, pancreatitis, multi level spinal stenosis C-spine, ankylosed C2-C3, cervical disc endplate degeneration, HTN, atherosclerosis, torticollis    PT Comments    Session focused on obtaining orthostatics and mobility. Patient orthostatic but improved since last session, see vitals below. During stand pivot transfer, patient gazed downward to feet and had severe dizziness requiring modA+2 to safely return to sitting in recliner and protect head. Nystagmus noted. Will request skilled PT to perform vestibular exam next session. Continue to recommend comprehensive inpatient rehab (CIR) for post-acute therapy needs.     Follow Up Recommendations  CIR     Equipment Recommendations  None recommended by PT    Recommendations for Other Services       Precautions / Restrictions Precautions Precautions: Fall Precaution Comments: orthostatic Restrictions Weight Bearing Restrictions: No RLE Weight Bearing: Weight bearing as tolerated LLE Weight Bearing: Weight bearing as tolerated    Mobility  Bed Mobility Overal bed mobility: Needs Assistance Bed Mobility: Supine to Sit     Supine to sit: Supervision;HOB elevated     General bed mobility comments: supervision for safety, increased time to return to supine d/t reports of dizziness with positional changes    Transfers Overall transfer level: Needs assistance Equipment used: Rolling Mihran Lebarron (2 wheeled) Transfers: Sit to/from [facility] Sit to Stand: Min guard;+2 safety/equipment Stand pivot transfers: Min assist;+2 physical assistance;+2 safety/equipment       General transfer comment: min guard +2 for sit to stand x 2 from EOB.  MinA+2 for stand pivot transfer to recliner with RW. After looking down at feet, patient became increasingly dizzy and required modA+2 to safely return to sitting in recliner and protect head. Noted nystagmus upon sitting.  Ambulation/Gait             General Gait Details: deferred   Stairs             Wheelchair Mobility    Modified Rankin (Stroke Patients Only) Modified Rankin (Stroke Patients Only) Pre-Morbid Rankin Score: No symptoms Modified Rankin: Moderately severe disability     Balance Overall balance assessment: Needs assistance Sitting-balance support: Feet supported;No upper extremity supported Sitting balance-Leahy Scale: Fair Sitting balance - Comments: supervision for sitting EOB   Standing balance support: Bilateral upper extremity supported;During functional activity Standing balance-Leahy Scale: Poor Standing balance comment: reliant on UE support and external assist as patient required modA+2 to return to sitting having severe dizziness after looking down.                            Cognition Arousal/Alertness: Awake/alert Behavior During Therapy: WFL for tasks assessed/performed Overall Cognitive Status: Impaired/Different from baseline Area of Impairment: Attention;Awareness;Problem solving;Following commands;Safety/judgement                   Current Attention Level: Sustained   Following Commands: Follows one step commands consistently;Follows multi-step commands with increased time Safety/Judgement: Decreased awareness of safety Awareness: Emergent Problem Solving: Slow processing;Requires verbal cues General Comments: Patient asked "can you turn off the chair alarm? My wife and I can get me back to bed." even though patient is dizziness with movement and orthostatic.  Exercises      General Comments General comments (skin integrity, edema, etc.): spouse present throughout session. Supine 143/90 HR 102, Sitting  152/101 HR 108, Stand 120/96 HR 118, Sitting 145/100 HR 105, Following stand pivot transfer 158/111      Pertinent Vitals/Pain Pain Assessment: No/denies pain Pain Intervention(s): Monitored during session    Home Living                      Prior Function            PT Goals (current goals can now be found in the care plan section) Acute Rehab PT Goals Patient Stated Goal: to not be dizzy PT Goal Formulation: With patient/family Time For Goal Achievement: 01/01/21 Potential to Achieve Goals: Good Progress towards PT goals: Not progressing toward goals - comment    Frequency    Min 4X/week      PT Plan Current plan remains appropriate    Co-evaluation              AM-PAC PT "6 Clicks" Mobility   Outcome Measure  Help needed turning from your back to your side while in a flat bed without using bedrails?: A Little Help needed moving from lying on your back to sitting on the side of a flat bed without using bedrails?: A Little Help needed moving to and from a bed to a chair (including a wheelchair)?: A Little Help needed standing up from a chair using your arms (e.g., wheelchair or bedside chair)?: A Little Help needed to walk in hospital room?: A Lot Help needed climbing 3-5 steps with a railing? : Total 6 Click Score: 15    End of Session Equipment Utilized During Treatment: Gait belt Activity Tolerance: Treatment limited secondary to medical complications (Comment) (orthostatics, dizziness) Patient left: in chair;with call bell/phone within reach;with chair alarm set;with family/visitor present Nurse Communication: Mobility status;Other (comment) (orthostasis and need for vestibular eval) PT Visit Diagnosis: Unsteadiness on feet (R26.81);Ataxic gait (R26.0);Difficulty in walking, not elsewhere classified (R26.2)     Time: 3220-2542 PT Time Calculation (min) (ACUTE ONLY): 34 min  Charges:  $Therapeutic Activity: 23-37 mins                      Maryann Mccall A. Gilford Rile PT, DPT Acute Rehabilitation Services Pager [phone] Office [phone]    Linna Hoff 12/26/2020, 1:14 PM

## 2020-12-27 DIAGNOSIS — Z7289 Other problems related to lifestyle: Secondary | ICD-10-CM | POA: Diagnosis not present

## 2020-12-27 DIAGNOSIS — I10 Essential (primary) hypertension: Secondary | ICD-10-CM | POA: Diagnosis not present

## 2020-12-27 DIAGNOSIS — F418 Other specified anxiety disorders: Secondary | ICD-10-CM | POA: Diagnosis not present

## 2020-12-27 DIAGNOSIS — G243 Spasmodic torticollis: Secondary | ICD-10-CM | POA: Diagnosis not present
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
## 2020-12-28 ENCOUNTER — Other Ambulatory Visit (HOSPITAL_COMMUNITY): Payer: Self-pay

## 2020-12-28 DIAGNOSIS — I10 Essential (primary) hypertension: Secondary | ICD-10-CM | POA: Diagnosis not present

## 2020-12-28 DIAGNOSIS — F418 Other specified anxiety disorders: Secondary | ICD-10-CM | POA: Diagnosis not present

## 2020-12-28 DIAGNOSIS — Z7289 Other problems related to lifestyle: Secondary | ICD-10-CM | POA: Diagnosis not present

## 2020-12-28 DIAGNOSIS — F419 Anxiety disorder, unspecified: Secondary | ICD-10-CM | POA: Diagnosis not present

## 2020-12-28 MED ORDER — CLOPIDOGREL BISULFATE 75 MG PO TABS
75.0000 mg | ORAL_TABLET | Freq: Every day | ORAL | 0 refills | Status: DC
  Filled 2020-12-28 – 2020-12-29 (×2): qty 30, 30d supply, fill #0

## 2020-12-28 MED ORDER — TAMSULOSIN HCL 0.4 MG PO CAPS
0.4000 mg | ORAL_CAPSULE | Freq: Every day | ORAL | 0 refills | Status: DC
  Filled 2020-12-28 – 2020-12-29 (×2): qty 30, 30d supply, fill #0

## 2020-12-28 MED ORDER — ATORVASTATIN CALCIUM 80 MG PO TABS
80.0000 mg | ORAL_TABLET | Freq: Every day | ORAL | 0 refills | Status: DC
  Filled 2020-12-28: qty 30, 30d supply, fill #0

## 2020-12-28 MED ORDER — CILOSTAZOL 100 MG PO TABS
100.0000 mg | ORAL_TABLET | Freq: Two times a day (BID) | ORAL | 0 refills | Status: DC
Start: 2020-12-28 — End: 2021-01-14
  Filled 2020-12-28 – 2020-12-29 (×2): qty 60, 30d supply, fill #0
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
## 2020-12-28 NOTE — Progress Notes (Addendum)
Inpatient Rehabilitation Admissions Coordinator  I met with patient at bedside and then spoke with his wife upon her arrival. Wife states his COBRA is now active with his [employer]. I will begin authorization if it shows active, with his insurance for a possible CIR admit pending their approval.  Barbara Boyette, RN, MSN Rehab Admissions Coordinator [phone] 12/28/2020 12:33 PM  

## 2020-12-28 NOTE — Discharge Summary (Signed)
Physician Discharge Summary  Miguel [name] [id number] DOB: [birth year] DOA: 12/18/2020  PCP: Miguel Huddle, MD  Admit date: 12/18/2020 Discharge date: 12/28/2020  Admitted From: Home  Disposition:   CIR   Recommendations for Outpatient Follow-up and new medication changes:  1. Follow up with Dr Miguel [name] 7 to 10 days after discharge from inpatient rehab.    Home Health: na   Equipment/Devices: na    Discharge Condition: stable  CODE STATUS: full  Diet recommendation:  Heart healthy   Brief/Interim Summary: [name] was admitted to the hospital with a working diagnosis of acute ischemic CVA. Complicated with dizziness, poor balance and nausea.  65 year old male with past medical history for hypertension, depression, dyspepsia and anxiety who presented with dizziness and a mechanical fall. Reported 3 days of leaning to the left while walking, associated with dizziness. He did sustain a head trauma while riding his lawnmower and hitting a tree branch about 3 days ago. On his initial physical examination his blood pressure was 196/97, 108/100, heart rate 58, respiratory rate 16, temperature 98, oxygen saturation 98%, his lungswere clear to auscultation bilaterally, heart S1-S2, present, rhythmic, soft abdomen, no lower extremity edema.Strength was preserved upper and lower extremities.  Sodium 134, potassium 4.0, chloride 99, bicarb 25, glucose 165, BUN 9, creatinine 0.69, white count 4.5, hemoglobin 14.2, hematocrit 40.6, platelets 210. SARS COVID-19 negative. Urinalysis specific gravity 1.017. Toxicology positive for tetrahydrocannabinol.  Head CT without significant acute changes. Subtle asymmetric hypodensity in the central left cerebellum. Brain MRI with acute infarct left anterior and posterior cerebellum, small acute infarct right thalamus.  EKG 55 bpm, normal axis, normal intervals, sinus rhythm, no ST segment or T wave changes.  Patient with persistent disturbed  balance, headache and nauseathat prolonged his hospitalization.  Positiveorthostatic hypotensionand veritgo, with poor balance and nausea.   Pending transfer to inpatient rehab.  1.  Acute ischemic CVA, left anterior and posterior cerebellum, right thalamic infarct.   Patient had a prolonged hospitalization, he was admitted to the medical ward and placed on the remote telemetry monitor.   He continued to have persistent poor balance, nausea and vertigo.  Positive orthostatic hypotension.  He was placed on dual antiplatelet therapy with clopidogrel and cilostazol plan to continue for 3 weeks then continue clopidogrel alone. High-dose atorvastatin at 80 mg daily.  Symptomatic control with tramadol, ondansetron and meclizine.  Plan to continue physical therapy at inpatient rehab.  2.  Uncontrolled hypertension.  Continue with valsartan for blood pressure control with good toleration.  3.  Depression/alcohol abuse.  No signs of acute withdrawal, continue bupropion, venlafaxine and clonazepam. GI prophylaxis with pantoprazole.  4.  Mild hyponatremia.  Clinically has resolved, patient tolerating p.o. diet adequately.   Discharge Diagnoses:  Principal Problem:   Acute CVA (cerebrovascular accident) [facility]) Active Problems:   Isolated cervical dystonia   Depression with anxiety   Essential hypertension   Alcohol use    Discharge Instructions  Discharge Instructions    Ambulatory referral to Neurology   Complete by: As directed    Follow up with stroke clinic NP (Miguel [name] or Miguel [name], if both not available, consider Miguel [name], or Miguel [name]) at [facility] in about 4 weeks. Thanks.     Allergies as of 12/28/2020      Reactions   Aspirin Anaphylaxis   Childhood reaction      Medication List    STOP taking these medications   XEOMIN IM     TAKE these medications   acetaminophen 500  MG tablet Commonly known as: TYLENOL Take 500 mg by mouth every 6 (six)  hours as needed for moderate pain or headache.   atorvastatin 80 MG tablet Commonly known as: LIPITOR Take 1 tablet (80 mg total) by mouth daily. Start taking on: December 29, 2020   buPROPion 150 MG 12 hr tablet Commonly known as: WELLBUTRIN SR TAKE 1 TABLET BY MOUTH ONCE DAILY 90 What changed:   how much to take  how to take this  when to take this   cilostazol 100 MG tablet Commonly known as: PLETAL Take 1 tablet (100 mg total) by mouth 2 (two) times daily.   clonazePAM 1 MG tablet Commonly known as: KLONOPIN TAKE 1 TABLET BY MOUTH ONCE A DAY What changed: how much to take   clopidogrel 75 MG tablet Commonly known as: PLAVIX Take 1 tablet (75 mg total) by mouth daily. Start taking on: December 29, 2020   esomeprazole 40 MG capsule Commonly known as: NEXIUM Take 40 mg by mouth as needed (heartburn).   multivitamin capsule Take 1 capsule by mouth daily.   tamsulosin 0.4 MG Caps capsule Commonly known as: FLOMAX Take 1 capsule (0.4 mg total) by mouth daily after supper.   valsartan 80 MG tablet Commonly known as: DIOVAN TAKE 1 TABLET BY MOUTH ONCE A DAY What changed: how much to take   venlafaxine XR 75 MG 24 hr capsule Commonly known as: EFFEXOR-XR TAKE 1 CAPSULE BY MOUTH ONCE A DAY What changed: how much to take   Vitamin D 50 MCG (2000 UT) Caps Take 2,000 Units by mouth daily.       Follow-up Information    [facility]. Schedule an appointment as soon as possible for a visit in 4 week(s).   Specialty: Neurology Contact information: [place] [phone]             Allergies  Allergen Reactions  . Aspirin Anaphylaxis    Childhood reaction    Consultations:  Neurology    Procedures/Studies: CT ANGIO HEAD NECK W WO CM  Result Date: 12/18/2020 CLINICAL DATA:  Stroke EXAM: CT ANGIOGRAPHY HEAD AND NECK TECHNIQUE: Multidetector CT imaging of the head and neck was performed  using the standard protocol during bolus administration of intravenous contrast. Multiplanar CT image reconstructions and MIPs were obtained to evaluate the vascular anatomy. Carotid stenosis measurements (when applicable) are obtained utilizing NASCET criteria, using the distal internal carotid diameter as the denominator. CONTRAST:  28mL OMNIPAQUE IOHEXOL 350 MG/ML SOLN COMPARISON:  MRI head 12/18/2020.  CT head 12/18/2020 FINDINGS: CTA NECK FINDINGS Aortic arch: Standard branching. Imaged portion shows no evidence of aneurysm or dissection. No significant stenosis of the major arch vessel origins. Right carotid system: Atherosclerotic plaque right carotid bifurcation with calcified and noncalcified plaque. 50% diameter stenosis proximal right internal carotid artery. Left carotid system: Atherosclerotic disease left carotid bifurcation which is predominately calcified. No significant stenosis. Vertebral arteries: Right vertebral dominant and patent to the basilar without stenosis. Moderate to severe stenosis at the origin of the left vertebral artery. Small left vertebral artery with fading opacification distally and occlusion at the C1 level. Skeleton: Cervical spondylosis.  No acute skeletal abnormality. Other neck: Negative for mass or adenopathy. Upper chest: Lung apices clear bilaterally. Review of the MIP images confirms the above findings CTA HEAD FINDINGS Anterior circulation: Cavernous carotid widely patent bilaterally. Anterior and middle cerebral arteries patent bilaterally without stenosis or large vessel occlusion. Posterior circulation: Right  vertebral artery widely patent and supplies the basilar. Right PICA patent. Distal left vertebral artery occluded. Possible faint retrograde flow in the left V4 segment. Left PICA not visualized. Basilar widely patent. AICA patent bilaterally. Superior cerebellar and posterior cerebral arteries patent bilaterally. Venous sinuses: Normal venous enhancement.  Anatomic variants: None Review of the MIP images confirms the above findings IMPRESSION: 1. Moderate to severe stenosis origin of left vertebral artery. Occlusion distal left vertebral artery at C1 which appears acute. Left PICA not visualized. Patient has acute left PICA infarct on MRI. 2. Atherosclerotic disease in the carotid bifurcation bilaterally. 50% diameter stenosis proximal right internal carotid artery. No significant left carotid stenosis. Electronically Signed   By: Franchot Gallo M.D.   On: 12/18/2020 10:29   DG Ribs Unilateral W/Chest Right  Result Date: 12/18/2020 CLINICAL DATA:  Fall, right rib pain EXAM: RIGHT RIBS AND CHEST - 3+ VIEW COMPARISON:  None. FINDINGS: No fracture or other bone lesions are seen involving the ribs. There is no evidence of pneumothorax or pleural effusion. Both lungs are clear. Heart size and mediastinal contours are within normal limits. IMPRESSION: Negative. Electronically Signed   By: Fidela Salisbury MD   On: 12/18/2020 07:13   CT Head Wo Contrast  Result Date: 12/18/2020 CLINICAL DATA:  65 year old male struck by tree limb while working in yard. Subsequent nausea and vomiting. EXAM: CT HEAD WITHOUT CONTRAST TECHNIQUE: Contiguous axial images were obtained from the base of the skull through the vertex without intravenous contrast. COMPARISON:  Cervical spine MRI 11/15/2004. FINDINGS: Brain: No midline shift, ventriculomegaly, mass effect, evidence of mass lesion, intracranial hemorrhage or evidence of cortically based acute infarction. Subtle asymmetric hypodensity in the central left cerebellum best seen on series 3, image 9. Elsewhere gray-white matter differentiation is within normal limits. No cerebral or cerebellar cortical encephalomalacia identified. Right side posterosuperior frontal gyrus congenital sulcal variation suspected (series 3, image 31). Vascular: Mild Calcified atherosclerosis at the skull base. No suspicious intracranial vascular  hyperdensity. Skull: No fracture identified. Sinuses/Orbits: Small low-density fluid level plus circumferential mucosal thickening in the left maxillary sinus appears inflammatory. Trace sinus mucosal thickening elsewhere. Tympanic cavities are clear. Bilateral mastoids are well aerated. Other: Leftward gaze. Otherwise negative orbits soft tissues. No discrete scalp soft tissue injury identified. IMPRESSION: 1.  No acute traumatic injury identified. 2. Largely normal for age noncontrast CT appearance of the brain. But subtle asymmetric hypodensity in the central left cerebellum might be the sequelae of small vessel ischemia. 3. Mild paranasal sinus inflammation. Electronically Signed   By: Genevie Ann M.D.   On: 12/18/2020 04:22   CT Cervical Spine Wo Contrast  Result Date: 12/18/2020 CLINICAL DATA:  65 year old male struck by tree limb while working in yard. Subsequent nausea and vomiting. EXAM: CT CERVICAL SPINE WITHOUT CONTRAST TECHNIQUE: Multidetector CT imaging of the cervical spine was performed without intravenous contrast. Multiplanar CT image reconstructions were also generated. COMPARISON:  Cervical spine MRI 11/15/2004 and earlier. Head CT today reported separately. FINDINGS: Alignment: Stable cervical lordosis since 2006. Cervicothoracic junction alignment is within normal limits. Bilateral posterior element alignment is within normal limits. Skull base and vertebrae: Visualized skull base is intact. No atlanto-occipital dissociation. Normal C1-C2 alignment. No acute osseous abnormality identified. Soft tissues and spinal canal: No prevertebral fluid or swelling. No visible canal hematoma. Negative noncontrast neck soft tissues aside from carotid calcified atherosclerosis. Disc levels: Degenerative subchondral cyst of the odontoid process suspected. Chronic ankylosis C2 on C3 is new since 2006. Advanced cervical  spine disc and endplate degeneration elsewhere, with multilevel mild lower cervical spinal  stenosis suspected. Upper chest: Visible upper thoracic levels and lung apices appear negative. Other: None. IMPRESSION: 1. No acute traumatic injury identified in the cervical spine. 2. Ankylosis of C2-C3 since 2006. Advanced cervical disc and endplate degeneration elsewhere, with subsequent multilevel mild spinal stenosis suspected. Electronically Signed   By: Genevie Ann M.D.   On: 12/18/2020 04:26   MR BRAIN WO CONTRAST  Addendum Date: 12/18/2020   ADDENDUM REPORT: 12/18/2020 08:38 ADDENDUM: Note is made of marked atrophy of the posterior paraspinous muscles in the upper neck, right greater than left. This was not present on the prior cervical MRI of 11/15/2004. Electronically Signed   By: Franchot Gallo M.D.   On: 12/18/2020 08:38   Result Date: 12/18/2020 CLINICAL DATA:  Dizziness. EXAM: MRI HEAD WITHOUT CONTRAST TECHNIQUE: Multiplanar, multiecho pulse sequences of the brain and surrounding structures were obtained without intravenous contrast. COMPARISON:  CT head 12/18/2020 FINDINGS: Brain: Scattered small areas of acute infarct in the left posterior and inferior cerebellum corresponding to CT hypodensity. Additional small infarct in the right thalamus. Generalized atrophy. Mild white matter changes consistent with chronic microvascular ischemia. Negative for hemorrhage or mass. Vascular: Abnormal flow related signal distal left vertebral artery which may be stenotic or occluded. Otherwise normal arterial flow voids at the base of brain. Skull and upper cervical spine: No focal abnormality. Sinuses/Orbits: Mucosal edema paranasal sinuses.  Negative orbit Other: None IMPRESSION: Acute infarct left inferior and posterior cerebellum. Small acute infarct right thalamus. Electronically Signed: By: Franchot Gallo M.D. On: 12/18/2020 08:25   ECHOCARDIOGRAM COMPLETE  Result Date: 12/18/2020    ECHOCARDIOGRAM REPORT   Patient Name:   SAMYAK GIELOW Date of Exam: 12/18/2020 Medical Rec #:  [id number]  Height:        72.0 in Accession #:    [id number] Weight:       197.0 lb Date of Birth:  January 03, 1956 BSA:          2.117 m Patient Age:    28 years   BP:           174/113 mmHg Patient Gender: M          HR:           66 bpm. Exam Location:  Inpatient Procedure: 2D Echo Indications:    Stroke  History:        Patient has no prior history of Echocardiogram examinations.                 Risk Factors:Hypertension.  Sonographer:    Maudry Mayhew MHA, RDMS, RVT, RDCS Referring Phys: [id number] [place]  Sonographer Comments: Image acquisition challenging due to patient body habitus. IMPRESSIONS  1. Left ventricular ejection fraction, by estimation, is 45 to 50%. The left ventricle has mildly decreased function. The left ventricle demonstrates global hypokinesis. Left ventricular diastolic parameters were normal.  2. Right ventricular systolic function is normal. The right ventricular size is normal. Tricuspid regurgitation signal is inadequate for assessing PA pressure.  3. The mitral valve is grossly normal. Mild mitral valve regurgitation.  4. The aortic valve is grossly normal. Aortic valve regurgitation is trivial.  5. The inferior vena cava is normal in size with greater than 50% respiratory variability, suggesting right atrial pressure of 3 mmHg. Comparison(s): No prior Echocardiogram. Conclusion(s)/Recommendation(s): Normal biventricular function without evidence of hemodynamically significant valvular heart disease. No intracardiac source of embolism detected on this transthoracic  study. A transesophageal echocardiogram is recommended to exclude cardiac source of embolism if clinically indicated. FINDINGS  Left Ventricle: Left ventricular ejection fraction, by estimation, is 45 to 50%. The left ventricle has mildly decreased function. The left ventricle demonstrates global hypokinesis. The left ventricular internal cavity size was normal in size. There is  no left ventricular hypertrophy. Left ventricular  diastolic parameters were normal. Right Ventricle: The right ventricular size is normal. No increase in right ventricular wall thickness. Right ventricular systolic function is normal. Tricuspid regurgitation signal is inadequate for assessing PA pressure. Left Atrium: Left atrial size was normal in size. Right Atrium: Right atrial size was normal in size. Pericardium: There is no evidence of pericardial effusion. Mitral Valve: The mitral valve is grossly normal. There is mild thickening of the mitral valve leaflet(s). There is mild calcification of the mitral valve leaflet(s). Mild mitral valve regurgitation. Tricuspid Valve: The tricuspid valve is normal in structure. Tricuspid valve regurgitation is trivial. No evidence of tricuspid stenosis. Aortic Valve: The aortic valve is grossly normal. Aortic valve regurgitation is trivial. Aortic regurgitation PHT measures 324 msec. Aortic valve mean gradient measures 5.0 mmHg. Aortic valve peak gradient measures 7.7 mmHg. Aortic valve area, by VTI measures 1.61 cm. Pulmonic Valve: The pulmonic valve was not well visualized. Pulmonic valve regurgitation is not visualized. Aorta: The aortic root, ascending aorta and aortic arch are all structurally normal, with no evidence of dilitation or obstruction. Venous: The inferior vena cava is normal in size with greater than 50% respiratory variability, suggesting right atrial pressure of 3 mmHg. IAS/Shunts: The atrial septum is grossly normal.  LEFT VENTRICLE PLAX 2D LVIDd:         5.60 cm      Diastology LVIDs:         3.80 cm      LV e' medial:    7.78 cm/s LV PW:         0.90 cm      LV E/e' medial:  7.0 LV IVS:        0.80 cm      LV e' lateral:   15.00 cm/s LVOT diam:     1.90 cm      LV E/e' lateral: 3.6 LV SV:         46 LV SV Index:   22 LVOT Area:     2.84 cm  LV Volumes (MOD) LV vol d, MOD A2C: 119.0 ml LV vol d, MOD A4C: 125.0 ml LV vol s, MOD A2C: 61.9 ml LV vol s, MOD A4C: 67.9 ml LV SV MOD A2C:     57.1 ml LV SV  MOD A4C:     125.0 ml LV SV MOD BP:      56.7 ml RIGHT VENTRICLE RV S prime:     6.96 cm/s TAPSE (M-mode): 2.1 cm LEFT ATRIUM           Index       RIGHT ATRIUM          Index LA diam:      3.00 cm 1.42 cm/m  RA Area:     8.96 cm LA Vol (A2C): 23.9 ml 11.29 ml/m RA Volume:   16.70 ml 7.89 ml/m LA Vol (A4C): 28.4 ml 13.42 ml/m  AORTIC VALVE AV Area (Vmax):    1.75 cm AV Area (Vmean):   1.63 cm AV Area (VTI):     1.61 cm AV Vmax:  139.00 cm/s AV Vmean:          104.000 cm/s AV VTI:            0.289 m AV Peak Grad:      7.7 mmHg AV Mean Grad:      5.0 mmHg LVOT Vmax:         85.80 cm/s LVOT Vmean:        59.900 cm/s LVOT VTI:          0.164 m LVOT/AV VTI ratio: 0.57 AI PHT:            324 msec  AORTA Ao Root diam: 3.20 cm MITRAL VALVE MV Area (PHT): 5.27 cm    SHUNTS MV Decel Time: 144 msec    Systemic VTI:  0.16 m MR Peak grad: 119.2 mmHg   Systemic Diam: 1.90 cm MR Mean grad: 43.0 mmHg MR Vmax:      546.00 cm/s MR Vmean:     273.0 cm/s MV E velocity: 54.70 cm/s MV A velocity: 67.30 cm/s MV E/A ratio:  0.81 Buford Dresser MD Electronically signed by Buford Dresser MD Signature Date/Time: 12/18/2020/6:24:45 PM    Final    VAS US CAROTID (at [facility] and [facility] only)  Result Date: 12/19/2020 Carotid Arterial Duplex Study Indications:       CVA. Risk Factors:      Hypertension, past history of smoking. Limitations        Today's exam was limited due to the body habitus of the                    patient. Comparison Study:  12-18-2020 CTA head and neck showed 50% diameter stenosis of                    the proximal RT ICA. Moderate to severe stenosis at the                    origin of the LT vertebral. Performing Technologist: Darlin Coco RDMS,RVT  Examination Guidelines: A complete evaluation includes B-mode imaging, spectral Doppler, color Doppler, and power Doppler as needed of all accessible portions of each vessel. Bilateral testing is considered an integral part of a complete examination.  Limited examinations for reoccurring indications may be performed as noted.  Right Carotid Findings: +----------+--------+--------+--------+---------------------------+--------+           PSV cm/sEDV cm/sStenosisPlaque Description         Comments +----------+--------+--------+--------+---------------------------+--------+ CCA Prox  88      21                                                  +----------+--------+--------+--------+---------------------------+--------+ CCA Distal119     40                                                  +----------+--------+--------+--------+---------------------------+--------+ ICA Prox  89      30      1-39%   hypoechoic and heterogenous         +----------+--------+--------+--------+---------------------------+--------+ ICA Distal65      24                                                  +----------+--------+--------+--------+---------------------------+--------+  ECA       129     38                                                  +----------+--------+--------+--------+---------------------------+--------+ +----------+--------+-------+----------------+-------------------+           PSV cm/sEDV cmsDescribe        Arm Pressure (mmHG) +----------+--------+-------+----------------+-------------------+ [id number]            Multiphasic, WNL                    +----------+--------+-------+----------------+-------------------+ +---------+--------+--+--------+--+---------+ VertebralPSV cm/s51EDV cm/s18Antegrade +---------+--------+--+--------+--+---------+  Left Carotid Findings: +----------+--------+--------+--------+------------------+--------+           PSV cm/sEDV cm/sStenosisPlaque DescriptionComments +----------+--------+--------+--------+------------------+--------+ CCA Prox  108     25                                         +----------+--------+--------+--------+------------------+--------+ CCA Mid    99      27                                         +----------+--------+--------+--------+------------------+--------+ CCA Distal67      20                                         +----------+--------+--------+--------+------------------+--------+ ICA Prox  65      14      1-39%                              +----------+--------+--------+--------+------------------+--------+ ICA Distal72      28                                         +----------+--------+--------+--------+------------------+--------+ ECA       125     31                                         +----------+--------+--------+--------+------------------+--------+ +----------+--------+--------+----------------+-------------------+           PSV cm/sEDV cm/sDescribe        Arm Pressure (mmHG) +----------+--------+--------+----------------+-------------------+ [id number]             Multiphasic, WNL                    +----------+--------+--------+----------------+-------------------+ +---------+--------+--+--------+------------------+ VertebralPSV cm/s26EDV cm/sStenotic at origin +---------+--------+--+--------+------------------+   Summary: Right Carotid: Velocities in the right ICA are consistent with a 1-39% stenosis. Left Carotid: Velocities in the left ICA are consistent with a 1-39% stenosis. Vertebrals:  Right vertebral artery demonstrates antegrade flow. Subclavians: Normal flow hemodynamics were seen in bilateral subclavian              arteries. *See table(s) above for measurements and observations.  Electronically signed by Lemar Livings MD on 12/19/2020 at 9:39:18 AM.    Final  Subjective: Patient continue to have vertigo and poor balance, no nausea or vomiting, headache has improved, no chest pain or dyspnea.   Discharge Exam: Vitals:   12/28/20 0803 12/28/20 1205  BP: (!) 144/96 124/90  Pulse: 99 (!) 101  Resp: 18 18  Temp: 97.9 F (36.6 C) 98 F (36.7 C)  SpO2:  97% 97%   Vitals:   12/27/20 2005 12/27/20 2359 12/28/20 0803 12/28/20 1205  BP: (!) 140/93 (!) 135/93 (!) 144/96 124/90  Pulse: 94 95 99 (!) 101  Resp: 18  18 18   Temp: 99 F (37.2 C) 98.1 F (36.7 C) 97.9 F (36.6 C) 98 F (36.7 C)  TempSrc: Oral Oral Oral Oral  SpO2: 93% 96% 97% 97%  Weight:      Height:        General: Not in pain or dyspnea  Neurology: Awake and alert, positive vertigo and ambulatory dysfunction.  E ENT: no pallor, no icterus, oral mucosa moist Cardiovascular: No JVD. S1-S2 present, rhythmic, no gallops, rubs, or murmurs. No lower extremity edema. Pulmonary: positive breath sounds bilaterally, adequate air movement, no wheezing, rhonchi or rales. Gastrointestinal. Abdomen soft and non tender Skin. No rashes Musculoskeletal: no joint deformities   The results of significant diagnostics from this hospitalization (including imaging, microbiology, ancillary and laboratory) are listed below for reference.     Microbiology: No results found for this or any previous visit (from the past 240 hour(s)).   Labs: BNP (last 3 results) No results for input(s): BNP in the last 8760 hours. Basic Metabolic Panel: Recent Labs  Lab 12/22/20 0359 12/25/20 0534  NA 133*  --   K 4.1  --   CL 95*  --   CO2 29  --   GLUCOSE 202*  --   BUN 9  --   CREATININE 0.85 0.80  CALCIUM 10.2  --    Liver Function Tests: No results for input(s): AST, ALT, ALKPHOS, BILITOT, PROT, ALBUMIN in the last 168 hours. No results for input(s): LIPASE, AMYLASE in the last 168 hours. No results for input(s): AMMONIA in the last 168 hours. CBC: No results for input(s): WBC, NEUTROABS, HGB, HCT, MCV, PLT in the last 168 hours. Cardiac Enzymes: No results for input(s): CKTOTAL, CKMB, CKMBINDEX, TROPONINI in the last 168 hours. BNP: Invalid input(s): POCBNP CBG: No results for input(s): GLUCAP in the last 168 hours. D-Dimer No results for input(s): DDIMER in the last 72 hours. Hgb  A1c No results for input(s): HGBA1C in the last 72 hours. Lipid Profile No results for input(s): CHOL, HDL, LDLCALC, TRIG, CHOLHDL, LDLDIRECT in the last 72 hours. Thyroid function studies No results for input(s): TSH, T4TOTAL, T3FREE, THYROIDAB in the last 72 hours.  Invalid input(s): FREET3 Anemia work up No results for input(s): VITAMINB12, FOLATE, FERRITIN, TIBC, IRON, RETICCTPCT in the last 72 hours. Urinalysis    Component Value Date/Time   COLORURINE YELLOW 12/18/2020 [place] 12/18/2020 0643   LABSPEC 1.017 12/18/2020 0643   PHURINE 6.0 12/18/2020 [place] 12/18/2020 0643   HGBUR NEGATIVE 12/18/2020 [place] NEGATIVE 12/18/2020 0643   KETONESUR 80 (A) 12/18/2020 0643   PROTEINUR NEGATIVE 12/18/2020 0643   NITRITE NEGATIVE 12/18/2020 0643   LEUKOCYTESUR NEGATIVE 12/18/2020 0643   Sepsis Labs Invalid input(s): PROCALCITONIN,  WBC,  LACTICIDVEN Microbiology No results found for this or any previous visit (from the past 240 hour(s)).   Time coordinating discharge: 45 minutes  SIGNED:   Riccardo Dubin  Gerome Apley, MD  [facility] 12/28/2020, 12:59 PM
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
## 2020-12-29 ENCOUNTER — Other Ambulatory Visit (HOSPITAL_COMMUNITY): Payer: Self-pay

## 2020-12-29 DIAGNOSIS — F419 Anxiety disorder, unspecified: Secondary | ICD-10-CM | POA: Diagnosis not present

## 2020-12-29 DIAGNOSIS — I10 Essential (primary) hypertension: Secondary | ICD-10-CM | POA: Diagnosis not present

## 2020-12-29 DIAGNOSIS — F418 Other specified anxiety disorders: Secondary | ICD-10-CM | POA: Diagnosis not present

## 2020-12-29 DIAGNOSIS — Z7289 Other problems related to lifestyle: Secondary | ICD-10-CM | POA: Diagnosis not present

## 2020-12-29 NOTE — Progress Notes (Signed)
Pt observed not to have voided all through the night, assisted to the [facility] at 0600, sat up for about 50mins but could not void, pt became very nauseous, dizzy and vomited, Pt assisted back to bed, also tried to use the urinal, still could not void, bladder scan done, read 434ml, Zofran tab 4mg  given at 0655. Pt requested to wait for the wife before trying again. Same reported to on coming [place], pt reassured. [place], Neave Lenger Efe

## 2020-12-29 NOTE — Progress Notes (Signed)
Inpatient Rehab Admissions Coordinator:   Pt.'s COBRA policy is now active and I opened a case for pre-authorization for CIR admission this morning. I anticipate a response within 24 hours. I do not have a bed for this Pt. Or authorization yet, but will pursue for potential admit later this week pending bed availability or insurance authorization. Pt.'s wife updated.  Clemens Catholic, [place], [place] Admissions Coordinator  [phone] ([place]) [phone] (office)
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
## 2020-12-29 NOTE — Plan of Care (Signed)
  Problem: Education: Goal: Knowledge of disease or condition will improve Outcome: Progressing Goal: Knowledge of secondary prevention will improve Outcome: Progressing Goal: Knowledge of patient specific risk factors addressed and post discharge goals established will improve Outcome: Progressing   Problem: Intracerebral Hemorrhage Tissue Perfusion: Goal: Complications of Intracerebral Hemorrhage will be minimized Outcome: Progressing   Problem: Ischemic Stroke/TIA Tissue Perfusion: Goal: Complications of ischemic stroke/TIA will be minimized Outcome: Progressing

## 2020-12-29 NOTE — Progress Notes (Signed)
Physical Therapy Treatment Patient Details Name: Miguel [name] MRN: [id number] DOB: June 11, 1956 Today's Date: 12/29/2020    History of Present Illness 65 yo male with onset of new L cerebellar and R thalamic infarcts was brought to ED on 12/18/20.  PMHx:  EtOH, pancreatitis, multi level spinal stenosis C-spine, ankylosed C2-C3, cervical disc endplate degeneration, HTN, atherosclerosis, torticollis    PT Comments    Further vestibular investigation completed today.  All canal testing was negative.  Nystagmus seen consistent with central cerebellar pathology.  He continue to be unable to stand for >1 min due to sudden onset of nystagmus.  Stood EOB twice for ~45 sec-1 min when pt reports, "I'm going down" stiffens, and leans right and posteriorly.  During this time he has oscillating, intense, horizontal nystagmus lasting 10-15 seconds.  It stops and we can try again with the exact same results.  I discussed with MDs Arrien and Erlinda Hong if the orthostatic hypotension could produce this symptomology given his resting supine BPs today have been 90s-100s.  I did give x 1 seated gaze stability exercises and continue to teach compensatory strategies.  MDs to see if removing his BP meds all together helps.  PT to check orthostatics next session.   Follow Up Recommendations  CIR     Equipment Recommendations  Rolling walker with 5" wheels;3in1 (PT);Wheelchair (measurements PT);Wheelchair cushion (measurements PT)    Recommendations for Other Services       Precautions / Restrictions Precautions Precautions: Fall Precaution Comments: orthostatic hypotension    Mobility  Bed Mobility Overal bed mobility: Needs Assistance Bed Mobility: Rolling;Supine to Sit;Sit to Supine Rolling: Modified independent (Device/Increase time)   Supine to sit: Supervision Sit to supine: Supervision   General bed mobility comments: supervision for safety as I was not sure if his dizziness would be triggered by coming up to  sitting EOB.    Transfers Overall transfer level: Needs assistance Equipment used: Rolling walker (2 wheeled) Transfers: Sit to/from Stand Sit to Stand: Min assist;Mod assist         General transfer comment: Two person min assist to come to standing EOB, wife also behind to make sure he does not hit his head if he falls backwards into bed.  Min assist to stand and stabilize, mod assist to help lower when pt reports his symptoms starting. Stood x 2 for ~45 seconds -1 min  Ambulation/Gait             General Gait Details: NT due to consistent sudden onset intense spinning in standing.   Stairs             Wheelchair Mobility    Modified Rankin (Stroke Patients Only) Modified Rankin (Stroke Patients Only) Pre-Morbid Rankin Score: No symptoms Modified Rankin: Moderately severe disability     Balance Overall balance assessment: Needs assistance Sitting-balance support: Feet supported;Bilateral upper extremity supported;No upper extremity supported Sitting balance-Leahy Scale: Fair Sitting balance - Comments: close supervision EOB   Standing balance support: Bilateral upper extremity supported Standing balance-Leahy Scale: Poor Standing balance comment: reliant on support of RW and external support of min to mod assist in standing.           12/29/20 0001  Positional Testing  Dix-Hallpike Dix-Hallpike Right;Dix-Hallpike Left  Horizontal Canal Testing Horizontal Canal Right;Horizontal Canal Left  Dix-Hallpike Right  Dix-Hallpike Right Duration 0  Dix-Hallpike Right Symptoms No nystagmus  Dix-Hallpike Left  Dix-Hallpike Left Duration 0  Dix-Hallpike Left Symptoms No nystagmus  Horizontal Canal Right  Horizontal  Canal Right Duration 0  Horizontal Canal Right Symptoms Normal  Horizontal Canal Left  Horizontal Canal Left Duration 0  Horizontal Canal Left Symptoms Normal                      Cognition Arousal/Alertness: Awake/alert Behavior  During Therapy: WFL for tasks assessed/performed Overall Cognitive Status: Within Functional Limits for tasks assessed                                        Exercises Other Exercises Other Exercises: x1 seated vertical and horizontal head shaking.  encouraged 5-10xs/day each direction for ~ 1 minute.  Only do seated EOB if someone is there to supervise.  Can do in bed with target at end of the bed.  Posted several targets around the room to help stabilize his gaze.    General Comments General comments (skin integrity, edema, etc.): wife is present and fully engaged in helping with the session.      Pertinent Vitals/Pain Pain Assessment: No/denies pain    Home Living                      Prior Function            PT Goals (current goals can now be found in the care plan section) Acute Rehab PT Goals Patient Stated Goal: to not be dizzy Progress towards PT goals: Not progressing toward goals - comment (limited by intense spinning in standing.)    Frequency    Min 4X/week      PT Plan Current plan remains appropriate    Co-evaluation              AM-PAC PT "6 Clicks" Mobility   Outcome Measure  Help needed turning from your back to your side while in a flat bed without using bedrails?: A Little Help needed moving from lying on your back to sitting on the side of a flat bed without using bedrails?: A Little Help needed moving to and from a bed to a chair (including a wheelchair)?: Total Help needed standing up from a chair using your arms (e.g., wheelchair or bedside chair)?: Total Help needed to walk in hospital room?: Total Help needed climbing 3-5 steps with a railing? : Total 6 Click Score: 10    End of Session Equipment Utilized During Treatment: Gait belt Activity Tolerance: Other (comment) (limited by intense spinning in standing.) Patient left: in bed;with call bell/phone within reach;with bed alarm set Nurse Communication:  Mobility status PT Visit Diagnosis: Unsteadiness on feet (R26.81);Difficulty in walking, not elsewhere classified (R26.2);Other symptoms and signs involving the nervous system (B14.782)     Time: 9562-1308 PT Time Calculation (min) (ACUTE ONLY): 72 min  Charges:  $Therapeutic Exercise: 8-22 mins $Therapeutic Activity: 53-67 mins                     Verdene Lennert, PT, DPT  Acute Rehabilitation [phone] pager [phone]) [phone] office

## 2020-12-30 DIAGNOSIS — F418 Other specified anxiety disorders: Secondary | ICD-10-CM

## 2020-12-30 MED ORDER — CALCIUM CARBONATE ANTACID 500 MG PO CHEW
1.0000 | CHEWABLE_TABLET | ORAL | Status: DC | PRN
  Administered 2020-12-31: 200 mg via ORAL
  Filled 2020-12-30: qty 1

## 2020-12-30 MED ORDER — SODIUM CHLORIDE 0.9 % IV BOLUS
500.0000 mL | Freq: Once | INTRAVENOUS | Status: AC
  Administered 2020-12-30: 500 mL via INTRAVENOUS

## 2020-12-30 NOTE — Plan of Care (Signed)
  Problem: Education: Goal: Knowledge of disease or condition will improve Outcome: Progressing Goal: Knowledge of secondary prevention will improve Outcome: Progressing Goal: Knowledge of patient specific risk factors addressed and post discharge goals established will improve Outcome: Progressing   Problem: Intracerebral Hemorrhage Tissue Perfusion: Goal: Complications of Intracerebral Hemorrhage will be minimized Outcome: Progressing   Problem: Ischemic Stroke/TIA Tissue Perfusion: Goal: Complications of ischemic stroke/TIA will be minimized Outcome: Progressing   Problem: Education: Goal: Knowledge of General Education information will improve Description: Including pain rating scale, medication(s)/side effects and non-pharmacologic comfort measures Outcome: Progressing   Problem: Health Behavior/Discharge Planning: Goal: Ability to manage health-related needs will improve Outcome: Progressing   Problem: Clinical Measurements: Goal: Ability to maintain clinical measurements within normal limits will improve Outcome: Progressing Goal: Will remain free from infection Outcome: Progressing Goal: Diagnostic test results will improve Outcome: Progressing Goal: Respiratory complications will improve Outcome: Progressing Goal: Cardiovascular complication will be avoided Outcome: Progressing   Problem: Activity: Goal: Risk for activity intolerance will decrease Outcome: Progressing   Problem: Nutrition: Goal: Adequate nutrition will be maintained Outcome: Progressing   Problem: Coping: Goal: Level of anxiety will decrease Outcome: Progressing   Problem: Elimination: Goal: Will not experience complications related to bowel motility Outcome: Progressing Goal: Will not experience complications related to urinary retention Outcome: Progressing   Problem: Pain Managment: Goal: General experience of comfort will improve Outcome: Progressing   Problem: Safety: Goal:  Ability to remain free from injury will improve Outcome: Progressing   Problem: Skin Integrity: Goal: Risk for impaired skin integrity will decrease Outcome: Progressing

## 2020-12-30 NOTE — Progress Notes (Signed)
PROGRESS NOTE  Miguel [name] MEQ:[id number] DOB: [birth year] DOA: 12/18/2020 PCP: Josetta Huddle, MD   LOS: 12 days   Brief Narrative / Interim history: 65 year old male with HTN, depression, anxiety came into the hospital with dizziness and a fall.  He reported 3 days of leaning to the left while walking associated with dizziness.  Brain MRI on admission showed acute infarct left anterior and posterior cerebellum as well as small acute infarct in the right thalamus.  Subjective / 24h Interval events: He is eating breakfast this morning, doing overall well.  He reports that the blood pressure 297 systolic is too low for him and he gets dizzy with that.  Assessment & Plan: Principal Problem Acute ischemic CVA, left anterior and posterior cerebellum, right thalamic infarct -he continues to have some degree of vertigo with movement, triggering nausea.  Neurology consulted and followed patient while hospitalized.  CT angiogram showed moderate to severe stenosis origin of left vertebral artery, occlusion distal left vertebral artery at C1 which appears acute.  Left PICA not visualized.  It also showed atherosclerotic disease in the carotid bifurcation bilaterally, 50% diameter stenosis proximal right ICA, no significant left ICA stenosis.  Lipid panel showed an LDL of 137.  He is on a statin.  A1c is 6.4.  A 2D echo showed EF 45-50%, global hypokinesis, RV is normal.  Neurology recommends dual antiplatelet therapy with clopidogrel and cilostazol (allergic to aspirin) for 3 weeks, followed by clopidogrel alone.  PT recommending CIR, insurance authorization is pending  Active Problems Essential hypertension-patient tells me that systolic in the 989Q is too low for him and makes him lightheaded and dizzy upon standing.  Dr. Cathlean Sauer discontinued his blood pressure medications 2/26, monitor off of them.  Depression/alcohol use-no withdrawals, continue home medications  Mild  hyponatremia-resolved  Intermittent urinary retention/BPH-continue Flomax   Scheduled Meds: . atorvastatin  80 mg Oral Daily  . buPROPion  150 mg Oral Daily  . cilostazol  100 mg Oral BID  . clonazePAM  1 mg Oral Daily  . clopidogrel  75 mg Oral Daily  . enoxaparin (LOVENOX) injection  40 mg Subcutaneous Daily  . polyethylene glycol  17 g Oral BID  . tamsulosin  0.4 mg Oral QPC supper  . venlafaxine XR  75 mg Oral Daily   Continuous Infusions: PRN Meds:.acetaminophen **OR** acetaminophen, hydrALAZINE, meclizine, ondansetron, traMADol  Diet Orders (From admission, onward)    Start     Ordered   12/24/20 1351  Diet regular Room service appropriate? Yes; Fluid consistency: Thin  Diet effective now       Question Answer Comment  Room service appropriate? Yes   Fluid consistency: Thin      12/24/20 1351          DVT prophylaxis: enoxaparin (LOVENOX) injection 40 mg Start: 12/18/20 1215     Code Status: Full Code  Family Communication: No family at bedside  Status is: Inpatient  Remains inpatient appropriate because:Inpatient level of care appropriate due to severity of illness   Dispo: The patient is from: Home              Anticipated d/c is to: CIR              Patient currently is not medically stable to d/c.   Difficult to place patient No  Level of care: Med-Surg  Consultants:  Neurology  Procedures:  2D echo  Microbiology  none  Antimicrobials: none    Objective: Vitals:   12/30/20 0011  12/30/20 0412 12/30/20 0907 12/30/20 1114  BP: 116/82 134/84 123/79 116/88  Pulse: 100 (!) 108 (!) 108 (!) 109  Resp: 19 19 18 18   Temp: 98.6 F (37 C) 98.4 F (36.9 C) 98.6 F (37 C) 98.7 F (37.1 C)  TempSrc: Oral Oral  Oral  SpO2: 98% 96% 95% 95%  Weight:      Height:        Intake/Output Summary (Last 24 hours) at 12/30/2020 1147 Last data filed at 12/29/2020 1900 Gross per 24 hour  Intake --  Output 730 ml  Net -730 ml   Filed Weights    12/18/20 0339  Weight: 89.4 kg    Examination:  Constitutional: NAD Eyes: no scleral icterus ENMT: Mucous membranes are moist.  Neck: normal, supple Respiratory: clear to auscultation bilaterally, no wheezing, no crackles. Normal respiratory effort. No accessory muscle use.  Cardiovascular: Regular rate and rhythm, no murmurs / rubs / gallops. No LE edema.  Abdomen: non distended, no tenderness. Bowel sounds positive.  Musculoskeletal: no clubbing / cyanosis.  Skin: no rashes Neurologic: non focal   Data Reviewed: I have independently reviewed following labs and imaging studies   CBC: No results for input(s): WBC, NEUTROABS, HGB, HCT, MCV, PLT in the last 168 hours. Basic Metabolic Panel: Recent Labs  Lab 12/25/20 0534  CREATININE 0.80   Liver Function Tests: No results for input(s): AST, ALT, ALKPHOS, BILITOT, PROT, ALBUMIN in the last 168 hours. Coagulation Profile: No results for input(s): INR, PROTIME in the last 168 hours. HbA1C: No results for input(s): HGBA1C in the last 72 hours. CBG: No results for input(s): GLUCAP in the last 168 hours.  No results found for this or any previous visit (from the past 240 hour(s)).   Radiology Studies: No results found.   Marzetta Board, MD, PhD [facility]  Between 7 am - 7 pm I am available, please contact me via Amion (for emergencies) or Securechat (non urgent messages)  Between 7 pm - 7 am I am not available, please contact night coverage MD/APP via Amion

## 2020-12-30 NOTE — Progress Notes (Signed)
Physical Therapy Treatment Patient Details Name: Miguel [name] MRN: [id number] DOB: [birth year] Today's Date: 12/30/2020    History of Present Illness 65 yo male with onset of new L cerebellar and R thalamic infarcts was brought to ED on 12/18/20.  PMHx:  EtOH, pancreatitis, multi level spinal stenosis C-spine, ankylosed C2-C3, cervical disc endplate degeneration, HTN, atherosclerosis, torticollis    PT Comments    PT/OT co session focusing on pt monitoring/attempts to see if we can move away from the bed today.  Pt stood for nearly 5 mins on his first stand EOB ortho statics were as follows:    12/30/20 1307  Vital Signs  Patient Position (if appropriate) Orthostatic Vitals  Orthostatic Lying   BP- Lying 155/89  Pulse- Lying 108  Orthostatic Sitting  BP- Sitting (!) 137/102 (139/99 after 5 mins EOB)  Pulse- Sitting 111  Orthostatic Standing at 0 minutes  BP- Standing at 0 minutes 119/89 (asymptomatic (no spike in dizziness today in standing))  Pulse- Standing at 0 minutes 117  Orthostatic Standing at 3 minutes  BP- Standing at 3 minutes (!) 138/95  Pulse- Standing at 3 minutes 122   We were excited as he had no spike in his dizziness symptoms with the first stand, so we got the recliner out and prepared to attempt to walk to the door.  On the second stand we did not take orthostatics because we were ready to walk and about 30-45 seconds into standing, pt had to sit down, reporting "I'm going down" for which he pulled back and to the right a bit more controlled than normal, his oscillating nystagmus started and we attempted to get him to focus on his wife while we kept him sitting upright (leaning right and posterior even once seated until the nystagmus stopped).   So, we re-applied the BP cuff and let pt recover in sitting a few mins and stood to take seated and standing BPs.  They were now as follows:    12/30/20 1344  Orthostatic Sitting  BP- Sitting 138/87  Pulse- Sitting 111   Orthostatic Standing at 0 minutes  BP- Standing at 0 minutes 106/73  Pulse- Standing at 0 minutes 115  Orthostatic Standing at 3 minutes  BP- Standing at 3 minutes  (unable)  Oxygen Therapy  SpO2 98 %  O2 Device Room Air   This third stand produced another bout of intense, oscillating, horizontal nystagmus and pitching to the right and posteriorly for which we had to sit (again, at ~30-45 seconds of standing).    We continue to theorize that the drop in BP is triggering some kind of dizziness for him (low profusion to the brain).  I spoke with the RN and Dr. Cruzita Lederer.  He is now completely off his BP meds, and we are going to try thigh high TED hose and abdominal binder next session.      Follow Up Recommendations  CIR     Equipment Recommendations  Rolling walker with 5" wheels;3in1 (PT);Wheelchair (measurements PT);Wheelchair cushion (measurements PT)    Recommendations for Other Services Rehab consult     Precautions / Restrictions Precautions Precautions: Fall Precaution Comments: orthostatic hypotension which triggers vertigo--be extremely careful    Mobility  Bed Mobility Overal bed mobility: Needs Assistance   Rolling: Modified independent (Device/Increase time)   Supine to sit: Supervision Sit to supine: Modified independent (Device/Increase time)   General bed mobility comments: supervision for safety coming up to sitting, uses rails for most bed mobility.  Transfers Overall transfer level: Needs assistance Equipment used: Rolling walker (2 wheeled) Transfers: Sit to/from Stand Sit to Stand: +2 physical assistance;Min assist;Mod assist         General transfer comment: Two person min assist to stand x3.  First stand min assist up and min assist down due to no symptoms of dizziness. Second and third stand, min assist up, heavy mod assist down due to 2nd and 3rd stands triggered dizziness.  Ambulation/Gait             General Gait Details: unable  as once we were ready, his second stand, his dizziness was triggered and we could not step away from the bed.  Ortho static BPs were lower on the 3rd stand than on the 1st when he was asymptomatic.   Stairs             Wheelchair Mobility    Modified Rankin (Stroke Patients Only)       Balance Overall balance assessment: Needs assistance Sitting-balance support: Feet supported;No upper extremity supported Sitting balance-Leahy Scale: Fair Sitting balance - Comments: close supervision EOB, lists left, especially with hands unsupported, but knows and can self correct Postural control: Left lateral lean;Right lateral lean (in sitting left, when his dizziness is triggered in standing right and posteriorly.) Standing balance support: Bilateral upper extremity supported Standing balance-Leahy Scale: Poor Standing balance comment: reliant on support of RW and external support of min to mod assist in standing.                            Cognition Arousal/Alertness: Awake/alert Behavior During Therapy: WFL for tasks assessed/performed Overall Cognitive Status: Within Functional Limits for tasks assessed                                        Exercises Other Exercises Other Exercises: verbally reviewed x1 exercises as wife reports pt was moving head and eyes.  Continued to encourage 5-10 times per day in each direction as well as ankle pumps and leg lifts to help keep his legs moving, clot prevention, and strength.    General Comments General comments (skin integrity, edema, etc.): talked to RN and Dr. Cruzita Lederer, recommended thigh high TED hose and abdominal binder.      Pertinent Vitals/Pain Pain Assessment: Faces Faces Pain Scale: Hurts little more Pain Location: headache Pain Descriptors / Indicators: Aching Pain Intervention(s): Limited activity within patient's tolerance;Monitored during session;Repositioned;Other (comment) (says being flat or in  reverse trendelenberg feels good)    Home Living                      Prior Function            PT Goals (current goals can now be found in the care plan section) Progress towards PT goals: Progressing toward goals    Frequency    Min 4X/week      PT Plan Current plan remains appropriate    Co-evaluation PT/OT/SLP Co-Evaluation/Treatment: Yes Reason for Co-Treatment: Complexity of the patient's impairments (multi-system involvement);Necessary to address cognition/behavior during functional activity;For patient/therapist safety;To address functional/ADL transfers PT goals addressed during session: Mobility/safety with mobility;Balance;Proper use of DME;Strengthening/ROM        AM-PAC PT "6 Clicks" Mobility   Outcome Measure  Help needed turning from your back to your side while in a flat  bed without using bedrails?: A Little Help needed moving from lying on your back to sitting on the side of a flat bed without using bedrails?: A Little Help needed moving to and from a bed to a chair (including a wheelchair)?: Total Help needed standing up from a chair using your arms (e.g., wheelchair or bedside chair)?: Total Help needed to walk in hospital room?: Total Help needed climbing 3-5 steps with a railing? : Total 6 Click Score: 10    End of Session Equipment Utilized During Treatment: Gait belt Activity Tolerance: Other (comment) (limited by episodes of intense spinning in standing.) Patient left: in bed;with call bell/phone within reach;with bed alarm set Nurse Communication: Mobility status;Other (comment) (ortho static (+)) PT Visit Diagnosis: Unsteadiness on feet (R26.81);Difficulty in walking, not elsewhere classified (R26.2);Other symptoms and signs involving the nervous system (R29.898)     Time: 1301-1400 PT Time Calculation (min) (ACUTE ONLY): 59 min  Charges:  $Therapeutic Activity: 23-37 mins                     Verdene Lennert, PT, DPT  Acute  Rehabilitation [phone] pager [phone]) [phone] office

## 2020-12-30 NOTE — Progress Notes (Signed)
Inpatient Rehab Admissions Coordinator:   I do not have insurance authorization or a CIR  Bed for this patient today. I am hopeful that I will receive a decision from Pt.'s insurance sometime today. I have notified Pt. And family.   Clemens Catholic, [place], [place] Admissions Coordinator  [phone] ([place]) [phone] (office)

## 2020-12-30 NOTE — Progress Notes (Signed)
Occupational Therapy Treatment Patient Details Name: Miguel [name] MRN: [id number] DOB: 1956-04-11 Today's Date: 12/30/2020    History of present illness 65 yo male with onset of new L cerebellar and R thalamic infarcts was brought to ED on 12/18/20.  PMHx:  EtOH, pancreatitis, multi level spinal stenosis C-spine, ankylosed C2-C3, cervical disc endplate degeneration, HTN, atherosclerosis, torticollis   OT comments  Pt seen with PT to optimize safety and pt engagement.  Patient able to complete donning/doffing shoes from bed level, full LB dressing anticipate mod assist +2 when standing due to safety and dizziness.  BP assessed positionally, initally pt able to stand x 5 minutes without symptoms, but on 2nd/3rd standing trials patient becomes dizzy and requires mod assist +2 to safely transition back to sitting EOB until dizziness passes (with more controlled R lateral and posterior lean).  See PT note for BP details, but noted significant drop from supine to standing; and increased drop on 3rd stand.  Patient will benefit from thigh high TED hose and abdominal binder to support BP next session. Will follow.    Follow Up Recommendations  CIR    Equipment Recommendations  3 in 1 bedside commode    Recommendations for Other Services      Precautions / Restrictions Precautions Precautions: Fall Precaution Comments: orthostatic hypotension which triggers vertigo--be extremely careful Restrictions Weight Bearing Restrictions: No       Mobility Bed Mobility Overal bed mobility: Needs Assistance   Rolling: Modified independent (Device/Increase time)   Supine to sit: Supervision Sit to supine: Modified independent (Device/Increase time)   General bed mobility comments: supervision for safety coming up to sitting, uses rails for most bed mobility.    Transfers Overall transfer level: Needs assistance Equipment used: Rolling walker (2 wheeled) Transfers: Sit to/from Stand Sit to Stand:  +2 physical assistance;Min assist;Mod assist         General transfer comment: Two person min assist to stand x3.  First stand min assist up and min assist down due to no symptoms of dizziness. Second and third stand, min assist up, heavy mod assist down due to 2nd and 3rd stands triggered dizziness.    Balance Overall balance assessment: Needs assistance Sitting-balance support: Feet supported;No upper extremity supported Sitting balance-Leahy Scale: Fair Sitting balance - Comments: close supervision EOB, lists left, especially with hands unsupported, but knows and can self correct Postural control: Left lateral lean;Right lateral lean (in sitting left, when his dizziness is triggered in standing right and posteriorly.) Standing balance support: Bilateral upper extremity supported Standing balance-Leahy Scale: Poor Standing balance comment: reliant on support of RW and external support of min to mod assist in standing.                           ADL either performed or assessed with clinical judgement   ADL Overall ADL's : Needs assistance/impaired                     Lower Body Dressing: Moderate assistance;+2 for safety/equipment;Sit to/from stand;Bed level Lower Body Dressing Details (indicate cue type and reason): able to don/doff shoes crossing legs in bed, requires min assist +2 safety in standing and would require assist to manage clothes   Toilet Transfer Details (indicate cue type and reason): unable to progress past standing         Functional mobility during ADLs: Minimal assistance;+2 for physical assistance;+2 for safety/equipment;Rolling walker;Cueing for safety General ADL Comments:  remains limited by orthostatic hypotension and dizziness     Vision   Additional Comments: nystagmus with standing when BP drops and pt becomes dizzy   Perception     Praxis      Cognition Arousal/Alertness: Awake/alert Behavior During Therapy: WFL for tasks  assessed/performed Overall Cognitive Status: Within Functional Limits for tasks assessed                                          Exercises Exercises: Other exercises Other Exercises Other Exercises: verbally reviewed x1 exercises as wife reports pt was moving head and eyes.  Continued to encourage 5-10 times per day in each direction as well as ankle pumps and leg lifts to help keep his legs moving, clot prevention, and strength.   Shoulder Instructions       General Comments talked to RN and Dr. Cruzita Lederer, recommended thigh high TED hose and abdominal binder.    Pertinent Vitals/ Pain       Pain Assessment: Faces Faces Pain Scale: Hurts little more Pain Location: headache Pain Descriptors / Indicators: Aching Pain Intervention(s): Limited activity within patient's tolerance;Monitored during session;Repositioned (flat or reverse trendelenberg feels better)  Home Living                                          Prior Functioning/Environment              Frequency  Min 3X/week        Progress Toward Goals  OT Goals(current goals can now be found in the care plan section)  Progress towards OT goals: Progressing toward goals  Acute Rehab OT Goals Patient Stated Goal: to not be dizzy OT Goal Formulation: With patient  Plan Discharge plan remains appropriate;Frequency remains appropriate    Co-evaluation    PT/OT/SLP Co-Evaluation/Treatment: Yes Reason for Co-Treatment: Complexity of the patient's impairments (multi-system involvement);Necessary to address cognition/behavior during functional activity;For patient/therapist safety;To address functional/ADL transfers PT goals addressed during session: Mobility/safety with mobility;Balance;Proper use of DME;Strengthening/ROM OT goals addressed during session: ADL's and self-care      AM-PAC OT "6 Clicks" Daily Activity     Outcome Measure   Help from another person eating meals?:  None Help from another person taking care of personal grooming?: A Little Help from another person toileting, which includes using toliet, bedpan, or urinal?: A Lot Help from another person bathing (including washing, rinsing, drying)?: A Lot Help from another person to put on and taking off regular upper body clothing?: A Little Help from another person to put on and taking off regular lower body clothing?: A Lot 6 Click Score: 16    End of Session Equipment Utilized During Treatment: Gait belt;Rolling walker  OT Visit Diagnosis: Other abnormalities of gait and mobility (R26.89);History of falling (Z91.81);Dizziness and giddiness (R42)   Activity Tolerance Patient tolerated treatment well   Patient Left in bed;with call bell/phone within reach;with bed alarm set   Nurse Communication Mobility status        Time: 4235-3614 OT Time Calculation (min): 58 min  Charges: OT General Charges $OT Visit: 1 Visit OT Treatments $Self Care/Home Management : 8-22 mins $Therapeutic Activity: 8-22 mins  Jolaine Artist, OT Acute Rehabilitation Services Pager [phone] Office [phone]    Miguel [name] 12/30/2020, 3:11  PM

## 2020-12-31 ENCOUNTER — Inpatient Hospital Stay (HOSPITAL_COMMUNITY)
Admission: RE | Admit: 2020-12-31 | Discharge: 2021-01-14 | DRG: 057 | Disposition: A | Discharge status: Home / Self Care | Payer: BC Managed Care – PPO | Source: Intra-hospital | Attending: Physical Medicine & Rehabilitation | Admitting: Physical Medicine & Rehabilitation

## 2020-12-31 ENCOUNTER — Other Ambulatory Visit: Payer: Self-pay

## 2020-12-31 ENCOUNTER — Encounter (HOSPITAL_COMMUNITY): Payer: Self-pay | Admitting: Physical Medicine & Rehabilitation

## 2020-12-31 ENCOUNTER — Other Ambulatory Visit (HOSPITAL_COMMUNITY): Payer: Self-pay

## 2020-12-31 DIAGNOSIS — F32A Depression, unspecified: Secondary | ICD-10-CM | POA: Diagnosis not present

## 2020-12-31 DIAGNOSIS — E785 Hyperlipidemia, unspecified: Secondary | ICD-10-CM | POA: Diagnosis present

## 2020-12-31 DIAGNOSIS — Z87891 Personal history of nicotine dependence: Secondary | ICD-10-CM | POA: Diagnosis not present

## 2020-12-31 DIAGNOSIS — I10 Essential (primary) hypertension: Secondary | ICD-10-CM | POA: Diagnosis present

## 2020-12-31 DIAGNOSIS — E876 Hypokalemia: Secondary | ICD-10-CM | POA: Diagnosis not present

## 2020-12-31 DIAGNOSIS — Z79899 Other long term (current) drug therapy: Secondary | ICD-10-CM | POA: Diagnosis not present

## 2020-12-31 DIAGNOSIS — R338 Other retention of urine: Secondary | ICD-10-CM | POA: Diagnosis not present

## 2020-12-31 DIAGNOSIS — I69393 Ataxia following cerebral infarction: Secondary | ICD-10-CM | POA: Diagnosis not present

## 2020-12-31 DIAGNOSIS — G243 Spasmodic torticollis: Secondary | ICD-10-CM | POA: Diagnosis present

## 2020-12-31 DIAGNOSIS — Z560 Unemployment, unspecified: Secondary | ICD-10-CM

## 2020-12-31 DIAGNOSIS — Z7902 Long term (current) use of antithrombotics/antiplatelets: Secondary | ICD-10-CM

## 2020-12-31 DIAGNOSIS — Z716 Tobacco abuse counseling: Secondary | ICD-10-CM | POA: Diagnosis not present

## 2020-12-31 DIAGNOSIS — I951 Orthostatic hypotension: Secondary | ICD-10-CM | POA: Diagnosis not present

## 2020-12-31 DIAGNOSIS — I639 Cerebral infarction, unspecified: Secondary | ICD-10-CM | POA: Diagnosis present

## 2020-12-31 DIAGNOSIS — L309 Dermatitis, unspecified: Secondary | ICD-10-CM | POA: Diagnosis present

## 2020-12-31 DIAGNOSIS — Z7141 Alcohol abuse counseling and surveillance of alcoholic: Secondary | ICD-10-CM

## 2020-12-31 DIAGNOSIS — I63442 Cerebral infarction due to embolism of left cerebellar artery: Secondary | ICD-10-CM | POA: Diagnosis not present

## 2020-12-31 DIAGNOSIS — N401 Enlarged prostate with lower urinary tract symptoms: Secondary | ICD-10-CM | POA: Diagnosis present

## 2020-12-31 DIAGNOSIS — I6381 Other cerebral infarction due to occlusion or stenosis of small artery: Secondary | ICD-10-CM | POA: Diagnosis present

## 2020-12-31 DIAGNOSIS — R066 Hiccough: Secondary | ICD-10-CM | POA: Diagnosis not present

## 2020-12-31 DIAGNOSIS — E871 Hypo-osmolality and hyponatremia: Secondary | ICD-10-CM

## 2020-12-31 DIAGNOSIS — G4700 Insomnia, unspecified: Secondary | ICD-10-CM | POA: Diagnosis present

## 2020-12-31 DIAGNOSIS — K5900 Constipation, unspecified: Secondary | ICD-10-CM | POA: Diagnosis not present

## 2020-12-31 DIAGNOSIS — Z886 Allergy status to analgesic agent status: Secondary | ICD-10-CM

## 2020-12-31 LAB — CBC WITH DIFFERENTIAL/PLATELET
Abs Immature Granulocytes: 0.02 10*3/uL (ref 0.00–0.07)
Basophils Absolute: 0 10*3/uL (ref 0.0–0.1)
Basophils Relative: 1 %
Eosinophils Absolute: 0.1 10*3/uL (ref 0.0–0.5)
Eosinophils Relative: 1 %
HCT: 36.4 % — ABNORMAL LOW (ref 39.0–52.0)
Hemoglobin: 13.2 g/dL (ref 13.0–17.0)
Immature Granulocytes: 0 %
Lymphocytes Relative: 28 %
Lymphs Abs: 1.5 10*3/uL (ref 0.7–4.0)
MCH: 34.1 pg — ABNORMAL HIGH (ref 26.0–34.0)
MCHC: 36.3 g/dL — ABNORMAL HIGH (ref 30.0–36.0)
MCV: 94.1 fL (ref 80.0–100.0)
Monocytes Absolute: 0.7 10*3/uL (ref 0.1–1.0)
Monocytes Relative: 12 %
Neutro Abs: 3.2 10*3/uL (ref 1.7–7.7)
Neutrophils Relative %: 58 %
Platelets: 268 10*3/uL (ref 150–400)
RBC: 3.87 MIL/uL — ABNORMAL LOW (ref 4.22–5.81)
RDW: 11.4 % — ABNORMAL LOW (ref 11.5–15.5)
WBC: 5.5 10*3/uL (ref 4.0–10.5)
nRBC: 0 % (ref 0.0–0.2)

## 2020-12-31 LAB — CBC
HCT: 37.5 % — ABNORMAL LOW (ref 39.0–52.0)
Hemoglobin: 13.5 g/dL (ref 13.0–17.0)
MCH: 34 pg (ref 26.0–34.0)
MCHC: 36 g/dL (ref 30.0–36.0)
MCV: 94.5 fL (ref 80.0–100.0)
Platelets: 260 10*3/uL (ref 150–400)
RBC: 3.97 MIL/uL — ABNORMAL LOW (ref 4.22–5.81)
RDW: 11.2 % — ABNORMAL LOW (ref 11.5–15.5)
WBC: 5.9 10*3/uL (ref 4.0–10.5)
nRBC: 0 % (ref 0.0–0.2)

## 2020-12-31 LAB — BASIC METABOLIC PANEL
Anion gap: 10 (ref 5–15)
BUN: 13 mg/dL (ref 8–23)
CO2: 25 mmol/L (ref 22–32)
Calcium: 9.1 mg/dL (ref 8.9–10.3)
Chloride: 93 mmol/L — ABNORMAL LOW (ref 98–111)
Creatinine, Ser: 0.89 mg/dL (ref 0.61–1.24)
GFR, Estimated: >60 mL/min (ref >60)
Glucose, Bld: 170 mg/dL — ABNORMAL HIGH (ref 70–99)
Potassium: 3.3 mmol/L — ABNORMAL LOW (ref 3.5–5.1)
Sodium: 128 mmol/L — ABNORMAL LOW (ref 135–145)

## 2020-12-31 LAB — OSMOLALITY: Osmolality: 282 mOsm/kg (ref 275–295)

## 2020-12-31 MED ORDER — MECLIZINE HCL 25 MG PO TABS
25.0000 mg | ORAL_TABLET | Freq: Two times a day (BID) | ORAL | Status: DC | PRN
  Administered 2021-01-02: 25 mg via ORAL
  Filled 2020-12-31 (×2): qty 1

## 2020-12-31 MED ORDER — ENOXAPARIN SODIUM 40 MG/0.4ML IJ SOSY: 40.0000 mg | PREFILLED_SYRINGE | INTRAMUSCULAR | Status: DC

## 2020-12-31 MED ORDER — ENOXAPARIN SODIUM 40 MG/0.4ML IJ SOSY
40.0000 mg | PREFILLED_SYRINGE | Freq: Every day | INTRAMUSCULAR | Status: DC
  Administered 2021-01-01 – 2021-01-14 (×14): 40 mg via SUBCUTANEOUS
  Filled 2020-12-31 (×14): qty 0.4

## 2020-12-31 MED ORDER — CILOSTAZOL 100 MG PO TABS
100.0000 mg | ORAL_TABLET | Freq: Two times a day (BID) | ORAL | Status: AC
  Administered 2020-12-31 – 2021-01-10 (×20): 100 mg via ORAL
  Filled 2020-12-31 (×22): qty 1

## 2020-12-31 MED ORDER — POTASSIUM CHLORIDE CRYS ER 20 MEQ PO TBCR
40.0000 meq | EXTENDED_RELEASE_TABLET | Freq: Once | ORAL | Status: AC
  Administered 2020-12-31: 40 meq via ORAL
  Filled 2020-12-31: qty 2

## 2020-12-31 MED ORDER — CLOPIDOGREL BISULFATE 75 MG PO TABS
75.0000 mg | ORAL_TABLET | Freq: Every day | ORAL | Status: DC
  Administered 2021-01-01 – 2021-01-14 (×14): 75 mg via ORAL
  Filled 2020-12-31 (×15): qty 1

## 2020-12-31 MED ORDER — ACETAMINOPHEN 325 MG PO TABS: 650.0000 mg | ORAL_TABLET | Freq: Four times a day (QID) | ORAL | Status: DC | PRN

## 2020-12-31 MED ORDER — CLONAZEPAM 0.5 MG PO TABS
1.0000 mg | ORAL_TABLET | Freq: Every day | ORAL | Status: DC
  Administered 2021-01-01 – 2021-01-14 (×14): 1 mg via ORAL
  Filled 2020-12-31 (×14): qty 2

## 2020-12-31 MED ORDER — POLYETHYLENE GLYCOL 3350 17 G PO PACK
17.0000 g | PACK | Freq: Two times a day (BID) | ORAL | Status: DC
  Administered 2021-01-01 – 2021-01-10 (×3): 17 g via ORAL
  Filled 2020-12-31 (×25): qty 1

## 2020-12-31 MED ORDER — VENLAFAXINE HCL ER 75 MG PO CP24
75.0000 mg | ORAL_CAPSULE | Freq: Every day | ORAL | Status: DC
  Administered 2021-01-01 – 2021-01-14 (×14): 75 mg via ORAL
  Filled 2020-12-31 (×14): qty 1

## 2020-12-31 MED ORDER — ACETAMINOPHEN 650 MG RE SUPP: 650.0000 mg | Freq: Four times a day (QID) | RECTAL | Status: DC | PRN

## 2020-12-31 MED ORDER — ONDANSETRON 4 MG PO TBDP
4.0000 mg | ORAL_TABLET | Freq: Three times a day (TID) | ORAL | Status: DC | PRN
  Administered 2021-01-01 – 2021-01-11 (×7): 4 mg via ORAL
  Filled 2020-12-31 (×8): qty 1

## 2020-12-31 MED ORDER — ATORVASTATIN CALCIUM 80 MG PO TABS
80.0000 mg | ORAL_TABLET | Freq: Every day | ORAL | Status: DC
  Administered 2021-01-01 – 2021-01-14 (×14): 80 mg via ORAL
  Filled 2020-12-31 (×14): qty 1

## 2020-12-31 MED ORDER — BLOOD PRESSURE CONTROL BOOK
Freq: Once | Status: AC
  Filled 2020-12-31: qty 1

## 2020-12-31 MED ORDER — TRAMADOL HCL 50 MG PO TABS: 50.0000 mg | ORAL_TABLET | ORAL | Status: DC | PRN

## 2020-12-31 MED ORDER — BUPROPION HCL ER (SR) 150 MG PO TB12
150.0000 mg | ORAL_TABLET | Freq: Every day | ORAL | Status: DC
  Administered 2021-01-01 – 2021-01-14 (×14): 150 mg via ORAL
  Filled 2020-12-31 (×14): qty 1

## 2020-12-31 MED ORDER — CALCIUM CARBONATE ANTACID 500 MG PO CHEW
1.0000 | CHEWABLE_TABLET | ORAL | Status: DC | PRN
  Administered 2020-12-31 – 2021-01-02 (×2): 200 mg via ORAL
  Filled 2020-12-31 (×3): qty 1

## 2020-12-31 NOTE — Progress Notes (Signed)
Pt transferred to 4MW6 at this time.  All belongings are with him.  Wife at bedside.  Report called to Otho Ket, Therapist, sports.

## 2020-12-31 NOTE — Discharge Summary (Signed)
Physician Discharge Summary  Miguel [name] [id number] DOB: [birth year] DOA: 12/18/2020  PCP: Josetta Huddle, MD  Admit date: 12/18/2020 Discharge date: 12/31/2020  Admitted From: home Disposition:  CIR  Recommendations for Outpatient Follow-up:  1. Follow up with [place]: none Equipment/Devices: none  Discharge Condition: stable CODE STATUS: Full code Diet recommendation: regular  HPI: Per admitting MD, Miguel [name] is a 65 y.o. male with medical history significant of hypertension, depression, dyspepsia, and anxiety. 3 days ago the patient was riding his lawnmower and hit a tree branch.  He hit his head.  He did not lose consciousness. He initially had some bleeding under his L eye and dizziness that seemed to be present prior to the injury.  After going to bed, he noticed that he will veer to the L when he walks. He is having some dizziness associated with it. He is not having any speech troubles, difficulty with swallowing, vision changes, or weakness that he can tell. He has been compliant with his medications. He has never had a stroke before.  He is a former smoker.  He reports having anaphylaxis when taking aspirin.  Hospital Course / Discharge diagnoses: Principal Problem Acute ischemic CVA, left anterior and posterior cerebellum, right thalamic infarct -he continues to have some degree of vertigo with movement, triggering nausea.  Neurology consulted and followed patient while hospitalized.  CT angiogram showed moderate to severe stenosis origin of left vertebral artery, occlusion distal left vertebral artery at C1 which appears acute.  Left PICA not visualized.  It also showed atherosclerotic disease in the carotid bifurcation bilaterally, 50% diameter stenosis proximal right ICA, no significant left ICA stenosis.  Lipid panel showed an LDL of 137.  He is on a statin.  A1c is 6.4.  A 2D echo showed EF 45-50%, global hypokinesis, RV is normal.  Neurology recommends dual  antiplatelet therapy with clopidogrel and cilostazol (allergic to aspirin) for 3 weeks, followed by clopidogrel alone.    He will go to CIR  Active Problems Essential hypertension /orthostatic hypotension-patient tells me that systolic in the [id number] is too low for him and makes him lightheaded and dizzy upon standing.  Dr. Cathlean Sauer discontinued his blood pressure medications 2/26, his blood pressure has remained stable, continue to hold blood pressure medications on discharge.  He has a degree of orthostatic hypotension upon standing, would like to avoid midodrine/Florinef for now given occasional elevated blood pressures when supine, continue thigh high TED hoses and abdominal binder  Depression/alcohol use-no withdrawals, continue home medications  Hyponatremia-mild hyponatremia on admission, sodium 128 on discharge but has not been checked over the last several days.  This is mild, possibly slightly dehydrated given concurrent hypochloremia, I would monitor and if it does not improve with hydration and good p.o. intake probably consider stopping the Effexor as SNRIs can cause hyponatremia.  Intermittent urinary retention/BPH-continue Flomax  Sepsis ruled out   Discharge Instructions  Discharge Instructions    Ambulatory referral to Neurology   Complete by: As directed    Follow up with stroke clinic NP (Jessica Vanschaick or Cecille Rubin, if both not available, consider Zachery Dauer, or [place]) at [facility] in about 4 weeks. Thanks.     Allergies as of 12/31/2020      Reactions   Aspirin Anaphylaxis   Childhood reaction      Medication List    STOP taking these medications   valsartan 80 MG tablet Commonly known as: DIOVAN   XEOMIN IM  TAKE these medications   acetaminophen 500 MG tablet Commonly known as: TYLENOL Take 500 mg by mouth every 6 (six) hours as needed for moderate pain or headache.   atorvastatin 80 MG tablet Commonly known as: LIPITOR Take 1 tablet (80 mg  total) by mouth daily.   buPROPion 150 MG 12 hr tablet Commonly known as: WELLBUTRIN SR TAKE 1 TABLET BY MOUTH ONCE DAILY 90 What changed:   how much to take  how to take this  when to take this   cilostazol 100 MG tablet Commonly known as: PLETAL Take 1 tablet (100 mg total) by mouth 2 (two) times daily.   clonazePAM 1 MG tablet Commonly known as: KLONOPIN TAKE 1 TABLET BY MOUTH ONCE A DAY What changed: how much to take   clopidogrel 75 MG tablet Commonly known as: PLAVIX Take 1 tablet (75 mg total) by mouth daily.   esomeprazole 40 MG capsule Commonly known as: NEXIUM Take 40 mg by mouth as needed (heartburn).   multivitamin capsule Take 1 capsule by mouth daily.   tamsulosin 0.4 MG Caps capsule Commonly known as: FLOMAX Take 1 capsule (0.4 mg total) by mouth daily after supper.   venlafaxine XR 75 MG 24 hr capsule Commonly known as: EFFEXOR-XR TAKE 1 CAPSULE BY MOUTH ONCE A DAY What changed: how much to take   Vitamin D 50 MCG (2000 UT) Caps Take 2,000 Units by mouth daily.       Follow-up Information    [facility]. Schedule an appointment as soon as possible for a visit in 4 week(s).   Specialty: Neurology Contact information: [address] [place] [phone]       Josetta Huddle, MD Follow up in 1 week(s).   Specialty: Internal Medicine Contact information: [address] [place] [address] [phone]               Consultations:  Neurology   Procedures/Studies:  CT ANGIO HEAD NECK W WO CM  Result Date: 12/18/2020 CLINICAL DATA:  Stroke EXAM: CT ANGIOGRAPHY HEAD AND NECK TECHNIQUE: Multidetector CT imaging of the head and neck was performed using the standard protocol during bolus administration of intravenous contrast. Multiplanar CT image reconstructions and MIPs were obtained to evaluate the vascular anatomy. Carotid stenosis measurements (when  applicable) are obtained utilizing NASCET criteria, using the distal internal carotid diameter as the denominator. CONTRAST:  43mL OMNIPAQUE IOHEXOL 350 MG/ML SOLN COMPARISON:  MRI head 12/18/2020.  CT head 12/18/2020 FINDINGS: CTA NECK FINDINGS Aortic arch: Standard branching. Imaged portion shows no evidence of aneurysm or dissection. No significant stenosis of the major arch vessel origins. Right carotid system: Atherosclerotic plaque right carotid bifurcation with calcified and noncalcified plaque. 50% diameter stenosis proximal right internal carotid artery. Left carotid system: Atherosclerotic disease left carotid bifurcation which is predominately calcified. No significant stenosis. Vertebral arteries: Right vertebral dominant and patent to the basilar without stenosis. Moderate to severe stenosis at the origin of the left vertebral artery. Small left vertebral artery with fading opacification distally and occlusion at the C1 level. Skeleton: Cervical spondylosis.  No acute skeletal abnormality. Other neck: Negative for mass or adenopathy. Upper chest: Lung apices clear bilaterally. Review of the MIP images confirms the above findings CTA HEAD FINDINGS Anterior circulation: Cavernous carotid widely patent bilaterally. Anterior and middle cerebral arteries patent bilaterally without stenosis or large vessel occlusion. Posterior circulation: Right vertebral artery widely patent and supplies the basilar. Right PICA patent. Distal  left vertebral artery occluded. Possible faint retrograde flow in the left V4 segment. Left PICA not visualized. Basilar widely patent. AICA patent bilaterally. Superior cerebellar and posterior cerebral arteries patent bilaterally. Venous sinuses: Normal venous enhancement. Anatomic variants: None Review of the MIP images confirms the above findings IMPRESSION: 1. Moderate to severe stenosis origin of left vertebral artery. Occlusion distal left vertebral artery at C1 which appears  acute. Left PICA not visualized. Patient has acute left PICA infarct on MRI. 2. Atherosclerotic disease in the carotid bifurcation bilaterally. 50% diameter stenosis proximal right internal carotid artery. No significant left carotid stenosis. Electronically Signed   By: Franchot Gallo M.D.   On: 12/18/2020 10:29   DG Ribs Unilateral W/Chest Right  Result Date: 12/18/2020 CLINICAL DATA:  Fall, right rib pain EXAM: RIGHT RIBS AND CHEST - 3+ VIEW COMPARISON:  None. FINDINGS: No fracture or other bone lesions are seen involving the ribs. There is no evidence of pneumothorax or pleural effusion. Both lungs are clear. Heart size and mediastinal contours are within normal limits. IMPRESSION: Negative. Electronically Signed   By: Fidela Salisbury MD   On: 12/18/2020 07:13   CT Head Wo Contrast  Result Date: 12/18/2020 CLINICAL DATA:  65 year old male struck by tree limb while working in yard. Subsequent nausea and vomiting. EXAM: CT HEAD WITHOUT CONTRAST TECHNIQUE: Contiguous axial images were obtained from the base of the skull through the vertex without intravenous contrast. COMPARISON:  Cervical spine MRI 11/15/2004. FINDINGS: Brain: No midline shift, ventriculomegaly, mass effect, evidence of mass lesion, intracranial hemorrhage or evidence of cortically based acute infarction. Subtle asymmetric hypodensity in the central left cerebellum best seen on series 3, image 9. Elsewhere gray-white matter differentiation is within normal limits. No cerebral or cerebellar cortical encephalomalacia identified. Right side posterosuperior frontal gyrus congenital sulcal variation suspected (series 3, image 31). Vascular: Mild Calcified atherosclerosis at the skull base. No suspicious intracranial vascular hyperdensity. Skull: No fracture identified. Sinuses/Orbits: Small low-density fluid level plus circumferential mucosal thickening in the left maxillary sinus appears inflammatory. Trace sinus mucosal thickening elsewhere.  Tympanic cavities are clear. Bilateral mastoids are well aerated. Other: Leftward gaze. Otherwise negative orbits soft tissues. No discrete scalp soft tissue injury identified. IMPRESSION: 1.  No acute traumatic injury identified. 2. Largely normal for age noncontrast CT appearance of the brain. But subtle asymmetric hypodensity in the central left cerebellum might be the sequelae of small vessel ischemia. 3. Mild paranasal sinus inflammation. Electronically Signed   By: Genevie Ann M.D.   On: 12/18/2020 04:22   CT Cervical Spine Wo Contrast  Result Date: 12/18/2020 CLINICAL DATA:  65 year old male struck by tree limb while working in yard. Subsequent nausea and vomiting. EXAM: CT CERVICAL SPINE WITHOUT CONTRAST TECHNIQUE: Multidetector CT imaging of the cervical spine was performed without intravenous contrast. Multiplanar CT image reconstructions were also generated. COMPARISON:  Cervical spine MRI 11/15/2004 and earlier. Head CT today reported separately. FINDINGS: Alignment: Stable cervical lordosis since 2006. Cervicothoracic junction alignment is within normal limits. Bilateral posterior element alignment is within normal limits. Skull base and vertebrae: Visualized skull base is intact. No atlanto-occipital dissociation. Normal C1-C2 alignment. No acute osseous abnormality identified. Soft tissues and spinal canal: No prevertebral fluid or swelling. No visible canal hematoma. Negative noncontrast neck soft tissues aside from carotid calcified atherosclerosis. Disc levels: Degenerative subchondral cyst of the odontoid process suspected. Chronic ankylosis C2 on C3 is new since 2006. Advanced cervical spine disc and endplate degeneration elsewhere, with multilevel mild lower cervical spinal  stenosis suspected. Upper chest: Visible upper thoracic levels and lung apices appear negative. Other: None. IMPRESSION: 1. No acute traumatic injury identified in the cervical spine. 2. Ankylosis of C2-C3 since 2006.  Advanced cervical disc and endplate degeneration elsewhere, with subsequent multilevel mild spinal stenosis suspected. Electronically Signed   By: Genevie Ann M.D.   On: 12/18/2020 04:26   MR BRAIN WO CONTRAST  Addendum Date: 12/18/2020   ADDENDUM REPORT: 12/18/2020 08:38 ADDENDUM: Note is made of marked atrophy of the posterior paraspinous muscles in the upper neck, right greater than left. This was not present on the prior cervical MRI of 11/15/2004. Electronically Signed   By: Franchot Gallo M.D.   On: 12/18/2020 08:38   Result Date: 12/18/2020 CLINICAL DATA:  Dizziness. EXAM: MRI HEAD WITHOUT CONTRAST TECHNIQUE: Multiplanar, multiecho pulse sequences of the brain and surrounding structures were obtained without intravenous contrast. COMPARISON:  CT head 12/18/2020 FINDINGS: Brain: Scattered small areas of acute infarct in the left posterior and inferior cerebellum corresponding to CT hypodensity. Additional small infarct in the right thalamus. Generalized atrophy. Mild white matter changes consistent with chronic microvascular ischemia. Negative for hemorrhage or mass. Vascular: Abnormal flow related signal distal left vertebral artery which may be stenotic or occluded. Otherwise normal arterial flow voids at the base of brain. Skull and upper cervical spine: No focal abnormality. Sinuses/Orbits: Mucosal edema paranasal sinuses.  Negative orbit Other: None IMPRESSION: Acute infarct left inferior and posterior cerebellum. Small acute infarct right thalamus. Electronically Signed: By: Franchot Gallo M.D. On: 12/18/2020 08:25   ECHOCARDIOGRAM COMPLETE  Result Date: 12/18/2020    ECHOCARDIOGRAM REPORT   Patient Name:   Miguel [name] Date of Exam: 12/18/2020 Medical Rec #:  [id number]  Height:       72.0 in Accession #:    [id number] Weight:       197.0 lb Date of Birth:  [birth year] BSA:          2.117 m Patient Age:    66 years   BP:           174/113 mmHg Patient Gender: M          HR:           66 bpm. Exam  Location:  Inpatient Procedure: 2D Echo Indications:    Stroke  History:        Patient has no prior history of Echocardiogram examinations.                 Risk Factors:Hypertension.  Sonographer:    Maudry Mayhew MHA, RDMS, RVT, RDCS Referring Phys: [id number] [place]  Sonographer Comments: Image acquisition challenging due to patient body habitus. IMPRESSIONS  1. Left ventricular ejection fraction, by estimation, is 45 to 50%. The left ventricle has mildly decreased function. The left ventricle demonstrates global hypokinesis. Left ventricular diastolic parameters were normal.  2. Right ventricular systolic function is normal. The right ventricular size is normal. Tricuspid regurgitation signal is inadequate for assessing PA pressure.  3. The mitral valve is grossly normal. Mild mitral valve regurgitation.  4. The aortic valve is grossly normal. Aortic valve regurgitation is trivial.  5. The inferior vena cava is normal in size with greater than 50% respiratory variability, suggesting right atrial pressure of 3 mmHg. Comparison(s): No prior Echocardiogram. Conclusion(s)/Recommendation(s): Normal biventricular function without evidence of hemodynamically significant valvular heart disease. No intracardiac source of embolism detected on this transthoracic study. A transesophageal echocardiogram is recommended to exclude cardiac source of embolism  if clinically indicated. FINDINGS  Left Ventricle: Left ventricular ejection fraction, by estimation, is 45 to 50%. The left ventricle has mildly decreased function. The left ventricle demonstrates global hypokinesis. The left ventricular internal cavity size was normal in size. There is  no left ventricular hypertrophy. Left ventricular diastolic parameters were normal. Right Ventricle: The right ventricular size is normal. No increase in right ventricular wall thickness. Right ventricular systolic function is normal. Tricuspid regurgitation signal is  inadequate for assessing PA pressure. Left Atrium: Left atrial size was normal in size. Right Atrium: Right atrial size was normal in size. Pericardium: There is no evidence of pericardial effusion. Mitral Valve: The mitral valve is grossly normal. There is mild thickening of the mitral valve leaflet(s). There is mild calcification of the mitral valve leaflet(s). Mild mitral valve regurgitation. Tricuspid Valve: The tricuspid valve is normal in structure. Tricuspid valve regurgitation is trivial. No evidence of tricuspid stenosis. Aortic Valve: The aortic valve is grossly normal. Aortic valve regurgitation is trivial. Aortic regurgitation PHT measures 324 msec. Aortic valve mean gradient measures 5.0 mmHg. Aortic valve peak gradient measures 7.7 mmHg. Aortic valve area, by VTI measures 1.61 cm. Pulmonic Valve: The pulmonic valve was not well visualized. Pulmonic valve regurgitation is not visualized. Aorta: The aortic root, ascending aorta and aortic arch are all structurally normal, with no evidence of dilitation or obstruction. Venous: The inferior vena cava is normal in size with greater than 50% respiratory variability, suggesting right atrial pressure of 3 mmHg. IAS/Shunts: The atrial septum is grossly normal.  LEFT VENTRICLE PLAX 2D LVIDd:         5.60 cm      Diastology LVIDs:         3.80 cm      LV e' medial:    7.78 cm/s LV PW:         0.90 cm      LV E/e' medial:  7.0 LV IVS:        0.80 cm      LV e' lateral:   15.00 cm/s LVOT diam:     1.90 cm      LV E/e' lateral: 3.6 LV SV:         46 LV SV Index:   22 LVOT Area:     2.84 cm  LV Volumes (MOD) LV vol d, MOD A2C: 119.0 ml LV vol d, MOD A4C: 125.0 ml LV vol s, MOD A2C: 61.9 ml LV vol s, MOD A4C: 67.9 ml LV SV MOD A2C:     57.1 ml LV SV MOD A4C:     125.0 ml LV SV MOD BP:      56.7 ml RIGHT VENTRICLE RV S prime:     6.96 cm/s TAPSE (M-mode): 2.1 cm LEFT ATRIUM           Index       RIGHT ATRIUM          Index LA diam:      3.00 cm 1.42 cm/m  RA Area:      8.96 cm LA Vol (A2C): 23.9 ml 11.29 ml/m RA Volume:   16.70 ml 7.89 ml/m LA Vol (A4C): 28.4 ml 13.42 ml/m  AORTIC VALVE AV Area (Vmax):    1.75 cm AV Area (Vmean):   1.63 cm AV Area (VTI):     1.61 cm AV Vmax:           139.00 cm/s AV Vmean:  104.000 cm/s AV VTI:            0.289 m AV Peak Grad:      7.7 mmHg AV Mean Grad:      5.0 mmHg LVOT Vmax:         85.80 cm/s LVOT Vmean:        59.900 cm/s LVOT VTI:          0.164 m LVOT/AV VTI ratio: 0.57 AI PHT:            324 msec  AORTA Ao Root diam: 3.20 cm MITRAL VALVE MV Area (PHT): 5.27 cm    SHUNTS MV Decel Time: 144 msec    Systemic VTI:  0.16 m MR Peak grad: 119.2 mmHg   Systemic Diam: 1.90 cm MR Mean grad: 43.0 mmHg MR Vmax:      546.00 cm/s MR Vmean:     273.0 cm/s MV E velocity: 54.70 cm/s MV A velocity: 67.30 cm/s MV E/A ratio:  0.81 Buford Dresser MD Electronically signed by Buford Dresser MD Signature Date/Time: 12/18/2020/6:24:45 PM    Final    VAS US CAROTID (at [facility] and [facility] only)  Result Date: 12/19/2020 Carotid Arterial Duplex Study Indications:       CVA. Risk Factors:      Hypertension, past history of smoking. Limitations        Today's exam was limited due to the body habitus of the                    patient. Comparison Study:  12-18-2020 CTA head and neck showed 50% diameter stenosis of                    the proximal RT ICA. Moderate to severe stenosis at the                    origin of the LT vertebral. Performing Technologist: Darlin Coco RDMS,RVT  Examination Guidelines: A complete evaluation includes B-mode imaging, spectral Doppler, color Doppler, and power Doppler as needed of all accessible portions of each vessel. Bilateral testing is considered an integral part of a complete examination. Limited examinations for reoccurring indications may be performed as noted.  Right Carotid Findings: +----------+--------+--------+--------+---------------------------+--------+           PSV cm/sEDV  cm/sStenosisPlaque Description         Comments +----------+--------+--------+--------+---------------------------+--------+ CCA Prox  88      21                                                  +----------+--------+--------+--------+---------------------------+--------+ CCA Distal119     40                                                  +----------+--------+--------+--------+---------------------------+--------+ ICA Prox  89      30      1-39%   hypoechoic and heterogenous         +----------+--------+--------+--------+---------------------------+--------+ ICA Distal65      24                                                  +----------+--------+--------+--------+---------------------------+--------+  ECA       129     38                                                  +----------+--------+--------+--------+---------------------------+--------+ +----------+--------+-------+----------------+-------------------+           PSV cm/sEDV cmsDescribe        Arm Pressure (mmHG) +----------+--------+-------+----------------+-------------------+ [id number]            Multiphasic, WNL                    +----------+--------+-------+----------------+-------------------+ +---------+--------+--+--------+--+---------+ VertebralPSV cm/s51EDV cm/s18Antegrade +---------+--------+--+--------+--+---------+  Left Carotid Findings: +----------+--------+--------+--------+------------------+--------+           PSV cm/sEDV cm/sStenosisPlaque DescriptionComments +----------+--------+--------+--------+------------------+--------+ CCA Prox  108     25                                         +----------+--------+--------+--------+------------------+--------+ CCA Mid   99      27                                         +----------+--------+--------+--------+------------------+--------+ CCA Distal67      20                                          +----------+--------+--------+--------+------------------+--------+ ICA Prox  65      14      1-39%                              +----------+--------+--------+--------+------------------+--------+ ICA Distal72      28                                         +----------+--------+--------+--------+------------------+--------+ ECA       125     31                                         +----------+--------+--------+--------+------------------+--------+ +----------+--------+--------+----------------+-------------------+           PSV cm/sEDV cm/sDescribe        Arm Pressure (mmHG) +----------+--------+--------+----------------+-------------------+ [id number]             Multiphasic, WNL                    +----------+--------+--------+----------------+-------------------+ +---------+--------+--+--------+------------------+ VertebralPSV cm/s26EDV cm/sStenotic at origin +---------+--------+--+--------+------------------+   Summary: Right Carotid: Velocities in the right ICA are consistent with a 1-39% stenosis. Left Carotid: Velocities in the left ICA are consistent with a 1-39% stenosis. Vertebrals:  Right vertebral artery demonstrates antegrade flow. Subclavians: Normal flow hemodynamics were seen in bilateral subclavian              arteries. *See table(s) above for measurements and observations.  Electronically signed by Servando Snare MD on 12/19/2020 at 9:39:18 AM.    Final  Subjective: - no chest pain, shortness of breath, no abdominal pain, nausea or vomiting.   Discharge Exam: BP 114/85   Pulse 99   Temp 98.3 F (36.8 C) (Oral)   Resp 18   Ht 6' (1.829 m)   Wt 89.4 kg   SpO2 98%   BMI 26.72 kg/m   General: Pt is alert, awake, not in acute distress Cardiovascular: RRR, S1/S2 +, no rubs, no gallops Respiratory: CTA bilaterally, no wheezing, no rhonchi Abdominal: Soft, NT, ND, bowel sounds + Extremities: no edema, no cyanosis    The results of  significant diagnostics from this hospitalization (including imaging, microbiology, ancillary and laboratory) are listed below for reference.     Microbiology: No results found for this or any previous visit (from the past 240 hour(s)).   Labs: Basic Metabolic Panel: Recent Labs  Lab 12/25/20 0534 12/31/20 0354  NA  --  128*  K  --  3.3*  CL  --  93*  CO2  --  25  GLUCOSE  --  170*  BUN  --  13  CREATININE 0.80 0.89  CALCIUM  --  9.1   Liver Function Tests: No results for input(s): AST, ALT, ALKPHOS, BILITOT, PROT, ALBUMIN in the last 168 hours. CBC: Recent Labs  Lab 12/31/20 0354  WBC 5.9  HGB 13.5  HCT 37.5*  MCV 94.5  PLT 260   CBG: No results for input(s): GLUCAP in the last 168 hours. Hgb A1c No results for input(s): HGBA1C in the last 72 hours. Lipid Profile No results for input(s): CHOL, HDL, LDLCALC, TRIG, CHOLHDL, LDLDIRECT in the last 72 hours. Thyroid function studies No results for input(s): TSH, T4TOTAL, T3FREE, THYROIDAB in the last 72 hours.  Invalid input(s): FREET3 Urinalysis    Component Value Date/Time   COLORURINE YELLOW 12/18/2020 [place] 12/18/2020 0643   LABSPEC 1.017 12/18/2020 0643   PHURINE 6.0 12/18/2020 [place] 12/18/2020 0643   HGBUR NEGATIVE 12/18/2020 0643   BILIRUBINUR NEGATIVE 12/18/2020 0643   KETONESUR 80 (A) 12/18/2020 0643   PROTEINUR NEGATIVE 12/18/2020 0643   NITRITE NEGATIVE 12/18/2020 0643   LEUKOCYTESUR NEGATIVE 12/18/2020 0643    FURTHER DISCHARGE INSTRUCTIONS:   Get Medicines reviewed and adjusted: Please take all your medications with you for your next visit with your Primary MD   Laboratory/radiological data: Please request your Primary MD to go over all hospital tests and procedure/radiological results at the follow up, please ask your Primary MD to get all Hospital records sent to his/her office.   In some cases, they will be blood work, cultures and biopsy results  pending at the time of your discharge. Please request that your primary care M.D. goes through all the records of your hospital data and follows up on these results.   Also Note the following: If you experience worsening of your admission symptoms, develop shortness of breath, life threatening emergency, suicidal or homicidal thoughts you must seek medical attention immediately by calling 911 or calling your MD immediately  if symptoms less severe.   You must read complete instructions/literature along with all the possible adverse reactions/side effects for all the Medicines you take and that have been prescribed to you. Take any new Medicines after you have completely understood and accpet all the possible adverse reactions/side effects.    Do not drive when taking Pain medications or sleeping medications (Benzodaizepines)   Do not take more than prescribed Pain, Sleep and Anxiety Medications. It is  not advisable to combine anxiety,sleep and pain medications without talking with your primary care practitioner   Special Instructions: If you have smoked or chewed Tobacco  in the last 2 yrs please stop smoking, stop any regular Alcohol  and or any Recreational drug use.   Wear Seat belts while driving.   Please note: You were cared for by a hospitalist during your hospital stay. Once you are discharged, your primary care physician will handle any further medical issues. Please note that NO REFILLS for any discharge medications will be authorized once you are discharged, as it is imperative that you return to your primary care physician (or establish a relationship with a primary care physician if you do not have one) for your post hospital discharge needs so that they can reassess your need for medications and monitor your lab values.  Time coordinating discharge: 40 minutes  SIGNED:  Marzetta Board, MD, PhD 12/31/2020, 9:56 AM

## 2020-12-31 NOTE — Progress Notes (Addendum)
Inpatient Rehabilitation Medication Review by a Pharmacist  A complete drug regimen review was completed for this patient to identify any potential clinically significant medication issues.  Clinically significant medication issues were identified:  no  Check AMION for pharmacist assigned to patient if future medication questions/issues arise during this admission.  Pharmacist comments:    Flomax is on discharge summary, but not ordered on transfer. It was actually stopped this morning due to possibly contributing to dizziness on standing, per d/w Dr. Cruzita Lederer, so appropriate off.  - Nexium, Vitamin D and MVI listed to continue. MVI stopped 4/24. Had not been on Nexium (or MC sub Protonix) or Vitamin D while inpatient.  - on Plavix and Pletal (ASA allergy), with plan to stop Pletal after 3 weeks. Stop time in place is for total of 23 days, okay.    -  also of note:   Amlodipine 4/18>>4/22 stopped d/t orthostatic hypotension.   Irbesartan (for PTA valsartan) 4/15 >>stopped 4/26 d/t orthostatic hypotension.    Na low, to follow trend and could consider stopping if not improved with hydration and good PO.   Time spent performing this drug regimen review (minutes):  20   Arty Baumgartner, [place] 12/31/2020 3:51 PM
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
## 2020-12-31 NOTE — Progress Notes (Signed)
Met with the patient and wife to introduce self and role of the nurse CM and initate education on secondary stroke risks and medications. Patient and wife noted multi changes to medications within the past 24 hours due to hypotension and addition of TTEDs and abd binder. Reviewed DAPT x 3 wks then plavix solo per MD and tips for dietary modifications.  Spouse noted they were aware of hyponatremia and hypotension, touched on hypomagnesium and ? Prediabetes; A1C 6.5, but recent cortisone injection may have effected levels. Continue to follow along to discharge to address educational needs and collaborate with the SW to facilitate prep for discharge. Margarito Liner

## 2020-12-31 NOTE — TOC Transition Note (Signed)
Transition of Care [facility]) - CM/SW Discharge Note   Patient Details  Name: Miguel [name] MRN: [id number] Date of Birth: 1956-05-09  Transition of Care [facility]) CM/SW Contact:  Pollie Friar, RN Phone Number: 12/31/2020, 10:06 AM   Clinical Narrative:    Patient is discharging to CIR today. CM signing off.   Final next level of care: IP Rehab Facility Barriers to Discharge: No Barriers Identified   Patient Goals and CMS Choice        Discharge Placement                       Discharge Plan and Services                                     Social Determinants of Health (SDOH) Interventions     Readmission Risk Interventions No flowsheet data found.

## 2020-12-31 NOTE — Progress Notes (Signed)
Kirsteins, Luanna Salk, MD  Physician  Physical Medicine and Rehabilitation  Consult Note    Signed  Date of Service:  12/21/2020 5:37 AM      Related encounter: ED to Hosp-Admission (Current) from 12/18/2020 in [facility]       Signed      Expand All Collapse All     Show:Clear all [x] Manual[x] Template[] Copied  Added by: [x] Angiulli, Lavon Paganini, PA-C[x] Kirsteins, Luanna Salk, MD   [] Hover for details       Physical Medicine and Rehabilitation Consult Reason for Consult: Dizziness and falls Referring Physician: Dr. Myrene Buddy   HPI: Miguel [name] is a 65 y.o. right-handed male with history of cervical dystonia hypertension, depression, tobacco and alcohol use, pancreatitis 2005.  Per chart review patient lives with spouse.  Independent prior to admission.  Two-level home half bath on main level bed and bathroom upstairs.  Patient recently lost his job 3 weeks ago.  Presented 12/18/2020 with persistent dizziness x1 week as well as left-sided ataxia.  Patient had reported on 12/16/2020 while riding his lawnmower and struck a tree without loss of consciousness.  Cranial CT scan showed no acute traumatic injury identified.  CT cervical spine no acute traumatic injury identified.  Patient did not receive TPA.  MRI showed acute infarct left inferior posterior cerebellum.  Small acute infarct right thalamus.  Echocardiogram with ejection fraction of 45 to 50% mild global hypokinesis, mild MR.  No AAS.Marland Kitchen  Admission chemistries unremarkable except sodium 134 glucose 165, urinalysis negative nitrite, urine drug screen positive marijuana.  Presently maintained on Plavix for CVA prophylaxis as well as placed on Pletal 100 mg twice daily for 3 weeks then Plavix alone.  Subcutaneous Lovenox for DVT prophylaxis.  Maintained on a regular consistency diet.  Therapy evaluations completed due to patient's decreased functional mobility recommendations of physical medicine  rehab consult.   Review of Systems  Constitutional: Negative for chills and fever.  HENT: Negative for hearing loss.   Eyes: Negative for blurred vision and double vision.  Respiratory: Negative for cough and shortness of breath.   Cardiovascular: Negative for chest pain and palpitations.  Gastrointestinal: Positive for constipation. Negative for heartburn and nausea.  Genitourinary: Negative for dysuria, flank pain and hematuria.  Musculoskeletal: Positive for falls and myalgias.  Skin: Negative for rash.  Neurological: Positive for dizziness.       Cervical dystonia  Psychiatric/Behavioral: Positive for depression. The patient has insomnia.   All other systems reviewed and are negative.      Past Medical History:  Diagnosis Date  . Cervical dystonia   . Depression   . Erectile dysfunction due to arterial insufficiency   . Functional dyspepsia   . Hypertension   . Pancreatitis 2005   likely secondary to alcohol use  . Rosacea         Past Surgical History:  Procedure Laterality Date  . no past surgery          Family History  Problem Relation Age of Onset  . Uterine cancer Mother   . Emphysema Father    Social History:  reports that he has quit smoking. He has never used smokeless tobacco. He reports current alcohol use. He reports that he does not use drugs. Allergies:      Allergies  Allergen Reactions  . Aspirin Anaphylaxis   Medications Prior to Admission  Medication Sig Dispense Refill  . acetaminophen (TYLENOL) 500 MG tablet Take 500 mg by mouth every 6 (six)  hours as needed for moderate pain or headache.    Marland Kitchen buPROPion (WELLBUTRIN SR) 150 MG 12 hr tablet TAKE 1 TABLET BY MOUTH ONCE DAILY 90 (Patient taking differently: Take 150 mg by mouth daily.) 90 tablet 3  . Cholecalciferol (VITAMIN D) 2000 units CAPS Take 2,000 Units by mouth daily.    . clonazePAM (KLONOPIN) 1 MG tablet TAKE 1 TABLET BY MOUTH ONCE A DAY (Patient taking  differently: Take 1 mg by mouth daily.) 90 tablet 1  . esomeprazole (NEXIUM) 40 MG capsule Take 40 mg by mouth as needed (heartburn).    . Multiple Vitamin (MULTIVITAMIN) capsule Take 1 capsule by mouth daily.    . valsartan (DIOVAN) 80 MG tablet TAKE 1 TABLET BY MOUTH ONCE A DAY (Patient taking differently: Take 80 mg by mouth daily.) 90 tablet 3  . venlafaxine XR (EFFEXOR-XR) 75 MG 24 hr capsule TAKE 1 CAPSULE BY MOUTH ONCE A DAY (Patient taking differently: Take 75 mg by mouth daily.) 90 capsule 3  . IncobotulinumtoxinA (XEOMIN IM) Inject 300 Units into the muscle every 3 (three) months. (Patient not taking: No sig reported)      Home: Home Living Family/patient expects to be discharged to:: Private residence Living Arrangements: Spouse/significant other Available Help at Discharge: Family Type of Home: House Home Access: Stairs to enter Technical brewer of Steps: 1 Home Layout: Two level,1/2 bath on main level,Bed/bath upstairs Bathroom Shower/Tub: Multimedia programmer: Programmer, systems: Yes Home Equipment: None  Lives With: Spouse  Functional History: Prior Function Level of Independence: Independent Comments: independent ADLs, IADLs, driving; recently lost his job 3 weeks ago Functional Status:  Mobility: Bed Mobility Overal bed mobility: Needs Assistance Bed Mobility: Supine to Sit Supine to sit: Supervision Sit to supine: Min assist General bed mobility comments: supervision for safety Transfers Overall transfer level: Needs assistance Equipment used: Rolling walker (2 wheeled),2 person hand held assist Transfers: Sit to/from [employer] Sit to Stand: Min assist,+2 physical assistance,+2 safety/equipment Stand pivot transfers: Min assist,Mod assist,+2 physical assistance,+2 safety/equipment General transfer comment: min assist +2 for sit to stand with cueing for hand placement, pivot to recliner towards L side with  mod assist +2 using RW.  Requires cueing for midline positioning, using target to visualize midline improves signifincantly. Ambulation/Gait Ambulation/Gait assistance: Min assist,Mod assist,+2 physical assistance,+2 safety/equipment Gait Distance (Feet): 10 Feet (x10') Assistive device: 2 person hand held assist,Rolling walker (2 wheeled) Gait Pattern/deviations: Step-to pattern,Decreased stride length,Wide base of support,Ataxic,Staggering left General Gait Details: Initially with HHAx2 but required minA+2 at best. L lateral lean with manual assistance to correct at times. Patient able to self correct with cueing. Trialed use of RW for tactile feedback and support with improved ability to maintain midline. ModA+2 when told patient to "not think, just walk", patient exhibited increased gait speed, ataxia, and increased L lateral lean. Gait velocity: decreased  ADL: ADL Overall ADL's : Needs assistance/impaired Grooming: Wash/dry hands,Oral care,Minimal assistance,Standing Upper Body Bathing: Set up,Sitting Lower Body Bathing: Minimal assistance,Sit to/from stand,+2 for safety/equipment,+2 for physical assistance Upper Body Dressing : Min guard,Sitting Lower Body Dressing: Minimal assistance,+2 for physical assistance,+2 for safety/equipment,Sit to/from stand Toilet Transfer: Minimal assistance,Ambulation,RW Toilet Transfer Details (indicate cue type and reason): simulated in room Functional mobility during ADLs: Minimal assistance,Moderate assistance,+2 for physical assistance,+2 for safety/equipment,Rolling walker,Cueing for safety,Cueing for sequencing General ADL Comments: pt limited by impaired balance, decreased safety, dizziness  Cognition: Cognition Overall Cognitive Status: Impaired/Different from baseline Orientation Level: Oriented X4 Cognition Arousal/Alertness: Awake/alert Behavior  During Therapy: Impulsive Overall Cognitive Status: Impaired/Different from baseline Area  of Impairment: Safety/judgement,Awareness,Problem solving Following Commands: Follows one step commands inconsistently Safety/Judgement: Decreased awareness of safety Awareness: Emergent Problem Solving: Slow processing,Difficulty sequencing,Requires verbal cues General Comments: Impulsive at times. Aware of deficits, but requires cueing for safety and mulitmodal cueing to maintain midline.  Blood pressure (!) 155/83, pulse 96, temperature 97.7 F (36.5 C), temperature source Oral, resp. rate 19, height 6' (1.829 m), weight 89.4 kg, SpO2 95 %. Physical Exam Neurological:     Comments: Patient is awake alert no acute distress.  Makes eye contact with examiner.  Oriented to person place and time.  Follows simple commands.    General: No acute distress Mood and affect are appropriate Heart: Regular rate and rhythm no rubs murmurs or extra sounds Lungs: Clear to auscultation, breathing unlabored, no rales or wheezes Abdomen: Positive bowel sounds, soft nontender to palpation, nondistended Extremities: No clubbing, cyanosis, or edema Skin: No evidence of breakdown, no evidence of rash Neurologic: Cranial nerves II through XII intact, motor strength is 5/5 in bilateral deltoid, bicep, tricep, grip, hip flexor, knee extensors, ankle dorsiflexor and plantar flexor Sensory exam normal sensation to light touch  in bilateral upper and lower extremities Cerebellar exam normal finger to nose to finger as well as heel to shin in right  upper and lower extremities, no dysmetria LUE, mild dysmetria LLE Musculoskeletal: Full range of motion in all 4 extremities. No joint swelling   Lab Results Last 24 Hours  No results found for this or any previous visit (from the past 24 hour(s)).   Imaging Results (Last 48 hours)  No results found.     Assessment/Plan: Diagnosis: Left cerebellar infarct with limb and trunkal ataxia, central vestibular disorder 1. Does the need for close, 24 hr/day medical  supervision in concert with the patient's rehab needs make it unreasonable for this patient to be served in a less intensive setting? Yes 2. Co-Morbidities requiring supervision/potential complications: Hx HTN 3. Due to bladder management, bowel management, safety, skin/wound care, disease management, medication administration, pain management and patient education, does the patient require 24 hr/day rehab nursing? Yes 4. Does the patient require coordinated care of a physician, rehab nurse, therapy disciplines of PT, OT to address physical and functional deficits in the context of the above medical diagnosis(es)? Yes Addressing deficits in the following areas: balance, endurance, locomotion, strength, transferring, bowel/bladder control, bathing, dressing, toileting and psychosocial support 5. Can the patient actively participate in an intensive therapy program of at least 3 hrs of therapy per day at least 5 days per week? Yes 6. The potential for patient to make measurable gains while on inpatient rehab is excellent 7. Anticipated functional outcomes upon discharge from inpatient rehab are modified independent and supervision  with PT, modified independent and supervision with OT, n/a with SLP. 8. Estimated rehab length of stay to reach the above functional goals is: 7-10d 9. Anticipated discharge destination: Home 10. Overall Rehab/Functional Prognosis: good  RECOMMENDATIONS: This patient's condition is appropriate for continued rehabilitative care in the following setting: CIR Patient has agreed to participate in recommended program. Yes Note that insurance prior authorization may be required for reimbursement for recommended care.  Comment:   Cathlyn Parsons, PA-C 12/21/2020   "I have personally performed a face to face diagnostic evaluation of this patient.  Additionally, I have reviewed and concur with the physician assistant's documentation above." Charlett Blake M.D. [employer] Fellow Am Acad of  Phys Med and Rehab Diplomate Am Board of Electrodiagnostic Med Fellow Am Board of Interventional Pain            Revision History                   Routing History              Note Details  Author Charlett Blake, MD File Time 12/21/2020 1:44 PM  Author Type Physician Status Signed  Last Editor Charlett Blake, MD Service Physical Medicine and Rehabilitation

## 2020-12-31 NOTE — H&P (Signed)
Physical Medicine and Rehabilitation Admission H&P CC: ataxia due to L cerebellar stroke   No chief complaint on file. : HPI: Miguel [name] is a 65 year old right-handed male with history of cervical dystonia and receives Xeomin injections in the past followed by neurology services Dr. Evelena Leyden at [facility], hypertension, depression, tobacco and alcohol use as well as pancreatitis 2005.  Per chart review patient lives with spouse independent prior to admission.  Two-level home half bath on main level and bathroom upstairs.  Patient recently lost his job 3 weeks ago.  Presented 12/18/2020 with persistent dizziness x1 week as well as left-sided ataxia.  Patient had reported on 12/16/2020 while riding his lawnmower and struck a tree without loss of consciousness.  Cranial CT scan showed no acute traumatic injury identified.  CT cervical spine no acute traumatic injury identified.  Patient did not receive tPA.  MRI showed acute infarct left inferior posterior cerebellum.  Small acute infarct right thalamus.  CT angiogram head and neck showed moderate to severe stenosis origin of the left vertebral artery.  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  Atherosclerosis disease in the carotid bifurcation bilaterally 50% diameter stenosis proximal right internal carotid artery.  No significant left carotid stenosis.  Echocardiogram with ejection fraction 45 to 50% mild global hypokinesis, mild MR without AS.  Admission chemistries unremarkable except sodium 134 glucose 165 urinalysis negative nitrite urine drug screen positive marijuana.  Presently maintained on Plavix for CVA prophylaxis as well as Pletal 100 mg twice daily x3 weeks then Plavix alone.  Subcutaneous Lovenox for DVT prophylaxis.  Maintain on a regular consistency diet.  Therapy evaluations completed due to patient decreased functional mobility he was admitted for a comprehensive rehabilitation program.  Pt reports last got cervical  dystonia injections by Dr Gaye Alken in [place] in [place] 9 months ago due to the cost.  So things have been worse.  Also reports he didn't run into tree-he hit head on branch.  Having bad orthostatic hypotension Sx's- using Thigh high TEDs and abd binder and so no dizziness since things started.  NO passing out- never had prior to admission.  Denies pain LBM today.    Review of Systems  Constitutional: Negative for chills and fever.  HENT: Negative for hearing loss.   Eyes: Negative for blurred vision and double vision.  Respiratory: Negative for cough and shortness of breath.   Cardiovascular: Negative for chest pain, palpitations and leg swelling.  Gastrointestinal: Positive for constipation. Negative for heartburn, nausea and vomiting.  Genitourinary: Negative for dysuria, flank pain and hematuria.  Musculoskeletal: Positive for falls, myalgias and neck pain.  Neurological: Positive for dizziness.  Psychiatric/Behavioral: Positive for depression. The patient has insomnia.   All other systems reviewed and are negative.  Past Medical History:  Diagnosis Date  . Cervical dystonia   . Depression   . Erectile dysfunction due to arterial insufficiency   . Functional dyspepsia   . Hypertension   . Pancreatitis 2005   likely secondary to alcohol use  . Rosacea    Past Surgical History:  Procedure Laterality Date  . no past surgery     Family History  Problem Relation Age of Onset  . Uterine cancer Mother   . Emphysema Father    Social History:  reports that he has quit smoking. He has never used smokeless tobacco. He reports current alcohol use. He reports that he does not use drugs. Allergies:  Allergies  Allergen Reactions  .  Aspirin Anaphylaxis    Childhood reaction   Medications Prior to Admission  Medication Sig Dispense Refill  . acetaminophen (TYLENOL) 500 MG tablet Take 500 mg by mouth every 6 (six) hours as needed for moderate pain or headache.    Marland Kitchen  atorvastatin (LIPITOR) 80 MG tablet Take 1 tablet (80 mg total) by mouth daily. 30 tablet 0  . buPROPion (WELLBUTRIN SR) 150 MG 12 hr tablet TAKE 1 TABLET BY MOUTH ONCE DAILY 90 (Patient taking differently: Take 150 mg by mouth daily.) 90 tablet 3  . Cholecalciferol (VITAMIN D) 2000 units CAPS Take 2,000 Units by mouth daily.    . cilostazol (PLETAL) 100 MG tablet Take 1 tablet (100 mg total) by mouth 2 (two) times daily. 60 tablet 0  . clonazePAM (KLONOPIN) 1 MG tablet TAKE 1 TABLET BY MOUTH ONCE A DAY (Patient taking differently: Take 1 mg by mouth daily.) 90 tablet 1  . clopidogrel (PLAVIX) 75 MG tablet Take 1 tablet (75 mg total) by mouth daily. 30 tablet 0  . esomeprazole (NEXIUM) 40 MG capsule Take 40 mg by mouth as needed (heartburn).    . Multiple Vitamin (MULTIVITAMIN) capsule Take 1 capsule by mouth daily.    . tamsulosin (FLOMAX) 0.4 MG CAPS capsule Take 1 capsule (0.4 mg total) by mouth daily after supper. 30 capsule 0  . venlafaxine XR (EFFEXOR-XR) 75 MG 24 hr capsule TAKE 1 CAPSULE BY MOUTH ONCE A DAY (Patient taking differently: Take 75 mg by mouth daily.) 90 capsule 3    Drug Regimen Review Drug regimen was reviewed and remains appropriate with no significant issues identified  Home: Home Living Family/patient expects to be discharged to:: Private residence Living Arrangements: Spouse/significant other   Functional History:    Functional Status:  Mobility:          ADL:    Cognition: Cognition Orientation Level: Oriented X4    Physical Exam: Blood pressure 129/89, pulse (!) 106, temperature 98.4 F (36.9 C), temperature source Oral, resp. rate 16, weight 90.2 kg, SpO2 96 %. Physical Exam Vitals and nursing note reviewed.  Constitutional:      Appearance: He is normal weight.     Comments: Very reddened face; and significant eczema on face L>R Sitting up in bed- abd binder taken off; NAD  HENT:     Head: Normocephalic and atraumatic.     Comments:  Decreased facial sensation on L Smile equal; tongue midline    Nose: Nose normal. No congestion.     Mouth/Throat:     Mouth: Mucous membranes are dry.     Pharynx: Oropharynx is clear. No oropharyngeal exudate.  Eyes:     General:        Right eye: No discharge.        Left eye: No discharge.     Extraocular Movements: Extraocular movements intact.  Neck:     Comments: Notable cervical dystonia Cardiovascular:     Rate and Rhythm: Normal rate and regular rhythm.     Heart sounds: Normal heart sounds. No murmur heard. No gallop.   Pulmonary:     Comments: CTA B/L- no W/R/R- good air movement  Abdominal:     Comments: Soft, NT, ND, (+)BS    Musculoskeletal:     Cervical back: Neck supple.     Comments: 5/5 in UEs and LEs B/L  Skin:    Comments: Eczema on chest, back and face Heels and backside no skin breakdown  Neurological:  Mental Status: He is alert.     Comments: Patient is alert no acute distress.  Makes eye contact with examiner.  Oriented x3 and follows commands.  Fair awareness of deficits. Ox3 Searching pattern on LUE with finger to nose Also rapid alternating movement severely impaired L>R  Psychiatric:     Comments: Slightly flat, appropriate     Results for orders placed or performed during the hospital encounter of 12/31/20 (from the past 48 hour(s))  CBC WITH DIFFERENTIAL     Status: Abnormal   Collection Time: 12/31/20  3:03 PM  Result Value Ref Range   WBC 5.5 4.0 - 10.5 K/uL   RBC 3.87 (L) 4.22 - 5.81 MIL/uL   Hemoglobin 13.2 13.0 - 17.0 g/dL   HCT 36.4 (L) 39.0 - 52.0 %   MCV 94.1 80.0 - 100.0 fL   MCH 34.1 (H) 26.0 - 34.0 pg   MCHC 36.3 (H) 30.0 - 36.0 g/dL   RDW 11.4 (L) 11.5 - 15.5 %   Platelets 268 150 - 400 K/uL   nRBC 0.0 0.0 - 0.2 %   Neutrophils Relative % 58 %   Neutro Abs 3.2 1.7 - 7.7 K/uL   Lymphocytes Relative 28 %   Lymphs Abs 1.5 0.7 - 4.0 K/uL   Monocytes Relative 12 %   Monocytes Absolute 0.7 0.1 - 1.0 K/uL    Eosinophils Relative 1 %   Eosinophils Absolute 0.1 0.0 - 0.5 K/uL   Basophils Relative 1 %   Basophils Absolute 0.0 0.0 - 0.1 K/uL   Immature Granulocytes 0 %   Abs Immature Granulocytes 0.02 0.00 - 0.07 K/uL    Comment: Performed at [facility], [address] [place], [place] [address]   No results found.     Medical Problem List and Plan: 1.  Limb truncal ataxia/central vestibular disorder secondary to acute left cerebellar and right thalamus ischemic infarct  -patient may shower  -ELOS/Goals:7-10 days supervision to Mod I 2.  Antithrombotics: -DVT/anticoagulation: Lovenox  -antiplatelet therapy: Plavix 75 mg daily and Pletal 100 mg twice daily x3 weeks then Plavix alone 3. Pain Management: Tramadol as needed 4. Mood: Wellbutrin 150 mg daily, Klonopin 1 mg daily, Effexor 75 mg daily, Ativan as needed  -antipsychotic agents: N/A 5. Neuropsych: This patient is capable of making decisions on his own behalf. 6. Skin/Wound Care: Routine skin checks 7. Fluids/Electrolytes/Nutrition: Routine in and outs with follow-up chemistries 8.  Hypertension.  Avapro 75 mg daily, Norvasc 10 mg daily.  Monitor with increased mobility 9.  Hyperlipidemia.  Lipitor 10.  Cervical dystonia.  Patient has been followed in the past by Dr. Evelena Leyden and now Dr Gaye Alken in [place] at [facility] and has received Xeomin in the past 11.  History of tobacco alcohol use.  Alcohol negative on admission.  Provide counseling 12.  BPH.  Flomax 0.4 mg daily. 13. Eczema- might benefit from steroid creams?   Lavon Paganini Anguilli- PA-C 12/31/2020   I have personally performed a face to face diagnostic evaluation of this patient and formulated the key components of the plan.  Additionally, I have personally reviewed laboratory data, imaging studies, as well as relevant notes and concur with the physician assistant's documentation above.     Courtney Heys, MD 12/31/2020

## 2020-12-31 NOTE — Progress Notes (Signed)
IP rehab admissions - I have approval for inpatient rehab admission for today.  Bed available and will admit to CIR today.  Call me for questions.  [phone]

## 2021-01-01 DIAGNOSIS — I639 Cerebral infarction, unspecified: Secondary | ICD-10-CM

## 2021-01-01 DIAGNOSIS — I63442 Cerebral infarction due to embolism of left cerebellar artery: Secondary | ICD-10-CM

## 2021-01-01 LAB — COMPREHENSIVE METABOLIC PANEL
ALT: 36 U/L (ref 0–44)
AST: 21 U/L (ref 15–41)
Albumin: 3.8 g/dL (ref 3.5–5.0)
Alkaline Phosphatase: 86 U/L (ref 38–126)
Anion gap: 8 (ref 5–15)
BUN: 12 mg/dL (ref 8–23)
CO2: 30 mmol/L (ref 22–32)
Calcium: 9.7 mg/dL (ref 8.9–10.3)
Chloride: 94 mmol/L — ABNORMAL LOW (ref 98–111)
Creatinine, Ser: 1.02 mg/dL (ref 0.61–1.24)
GFR, Estimated: >60 mL/min (ref >60)
Glucose, Bld: 163 mg/dL — ABNORMAL HIGH (ref 70–99)
Potassium: 4.8 mmol/L (ref 3.5–5.1)
Sodium: 132 mmol/L — ABNORMAL LOW (ref 135–145)
Total Bilirubin: 0.7 mg/dL (ref 0.3–1.2)
Total Protein: 6.4 g/dL — ABNORMAL LOW (ref 6.5–8.1)

## 2021-01-01 MED ORDER — POTASSIUM CHLORIDE CRYS ER 10 MEQ PO TBCR
10.0000 meq | EXTENDED_RELEASE_TABLET | Freq: Every day | ORAL | Status: DC
  Administered 2021-01-01 – 2021-01-14 (×14): 10 meq via ORAL
  Filled 2021-01-01 (×14): qty 1

## 2021-01-01 NOTE — Evaluation (Signed)
Occupational Therapy Assessment and Plan  Patient Details  Name: Brittney Caraway MRN: [id number] Date of Birth: Apr 23, 1956  OT Diagnosis: abnormal posture, ataxia, cognitive deficits, hemiplegia affecting non-dominant side and muscle weakness (generalized) Rehab Potential: Rehab Potential (ACUTE ONLY): Good ELOS: 12-14 days   Today's Date: 01/01/2021 OT Individual Time: 0800-0902 OT Individual Time Calculation (min): 62 min     Hospital Problem: Principal Problem:   Cerebrovascular accident (CVA) of right thalamus ([place]) Active Problems:   Cerebral infarction due to embolism of left cerebellar artery ([place])   Past Medical History:  Past Medical History:  Diagnosis Date  . Cervical dystonia   . Depression   . Erectile dysfunction due to arterial insufficiency   . Functional dyspepsia   . Hypertension   . Pancreatitis 2005   likely secondary to alcohol use  . Rosacea    Past Surgical History:  Past Surgical History:  Procedure Laterality Date  . no past surgery      Assessment & Plan Clinical Impression: Patient is a 65 y.o. year old male with recent admission to the hospital on 12/18/2020 with persistent dizziness x1 week as well as left-sided ataxia.  Patient had reported on 12/16/2020 while riding his lawnmower and struck a tree without loss of consciousness.  Cranial CT scan showed no acute traumatic injury identified.  CT cervical spine no acute traumatic injury identified.  Patient did not receive tPA.  MRI showed acute infarct left inferior posterior cerebellum.  Small acute infarct right thalamus.  CT angiogram head and neck showed moderate to severe stenosis origin of the left vertebral artery.  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 Patient transferred to CIR on 12/31/2020 .    Patient currently requires min with basic self-care skills secondary to muscle weakness and muscle paralysis, impaired timing and sequencing, unbalanced muscle activation, ataxia and decreased  coordination, decreased problem solving and decreased memory, central origin and decreased sitting balance, decreased standing balance, decreased postural control, hemiplegia and decreased balance strategies.  Prior to hospitalization, patient could complete ADLs with independent .  Patient will benefit from skilled intervention to decrease level of assist with basic self-care skills, increase independence with basic self-care skills and increase level of independence with iADL prior to discharge home with care partner.  Anticipate patient will require 24 hour supervision and follow up outpatient.  OT - End of Session Activity Tolerance: Decreased this session Endurance Deficit: Yes OT Assessment Rehab Potential (ACUTE ONLY): Good OT Patient demonstrates impairments in the following area(s): Balance;Cognition;Motor;Safety OT Basic ADL's Functional Problem(s): Grooming;Bathing;Dressing;Toileting OT Advanced ADL's Functional Problem(s): Simple Meal Preparation OT Transfers Functional Problem(s): Toilet;Tub/Shower OT Additional Impairment(s): Fuctional Use of Upper Extremity OT Plan OT Intensity: Minimum of 1-2 x/day, 45 to 90 minutes OT Frequency: 5 out of 7 days OT Duration/Estimated Length of Stay: 12-14 days OT Treatment/Interventions: Balance/vestibular training;Cognitive remediation/compensation;Community reintegration;DME/adaptive equipment instruction;Discharge planning;Pain management;Self Care/advanced ADL retraining;Therapeutic Activities;UE/LE Coordination activities;Therapeutic Exercise;Patient/family education;Functional mobility training;Neuromuscular re-education;UE/LE Strength taining/ROM OT Self Feeding Anticipated Outcome(s): Independent OT Basic Self-Care Anticipated Outcome(s): Supervision OT Toileting Anticipated Outcome(s): Supervision OT Bathroom Transfers Anticipated Outcome(s): Supervision OT Recommendation Patient destination: Home Follow Up Recommendations:  Outpatient OT Equipment Recommended: To be determined   OT Evaluation Precautions/Restrictions  Precautions Precautions: Fall;Other (comment) Precaution Comments: orthostatic hypotension which triggers "vertigo"--be extremely careful; thigh high TED hose, Abd binder Restrictions Weight Bearing Restrictions: No RLE Weight Bearing: Weight bearing as tolerated LLE Weight Bearing: Weight bearing as tolerated  Pain Pain Assessment Pain Scale: 0-10 Pain Score: 0-No  pain Home Living/Prior Functioning Home Living Family/patient expects to be discharged to:: Private residence Living Arrangements: Spouse/significant other Available Help at Discharge: Family Type of Home: House Home Access: Stairs to enter Technical brewer of Steps: 3 Entrance Stairs-Rails: None Home Layout: 1/2 bath on main level,Bed/bath upstairs,Two level Alternate Level Stairs-Number of Steps: 7 steps, landing, 7 steps Alternate Level Stairs-Rails: Right Bathroom Shower/Tub: Multimedia programmer: Standard Bathroom Accessibility: Yes Additional Comments: walker would not fit in 1/2 bath on 1st floor  Lives With: Spouse IADL History Homemaking Responsibilities: No Current License: Yes Education: "9th grade + GED" Occupation: Unemployed 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 Prior Function Level of Independence: Independent with basic ADLs,Independent with homemaking with ambulation,Independent with transfers,Independent with gait  Able to Take Stairs?: Yes Driving: Yes 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; Associate Professor) Vision Baseline Vision/History:  (wears contacts for near and far vision) Wears Glasses: At all times (wears contacts) Patient Visual Report: No change from baseline Vision Assessment?: Yes Eye Alignment: Within Functional Limits Ocular Range of Motion: Within Functional Limits Alignment/Gaze Preference: Within Defined  Limits Tracking/Visual Pursuits: Able to track stimulus in all quads without difficulty Saccades: Within functional limits Convergence: Within functional limits Visual Fields: No apparent deficits Perception  Perception: Within Functional Limits Praxis Praxis: Intact Cognition Overall Cognitive Status: Impaired/Different from baseline Arousal/Alertness: Awake/alert Orientation Level: Place;Situation Year: 2022 Month: April Day of Week: Correct Memory: Impaired Memory Impairment: Storage deficit;Retrieval deficit;Decreased recall of new information Decreased Short Term Memory: Verbal basic Immediate Memory Recall: Sock;Blue;Bed Memory Recall Sock: Without Cue Memory Recall Blue: Without Cue Memory Recall Bed: Not able to recall Attention: Sustained;Selective Sustained Attention: Appears intact Selective Attention: Impaired Selective Attention Impairment: Verbal complex;Functional basic Awareness: Impaired Awareness Impairment: Emergent impairment Problem Solving: Impaired Problem Solving Impairment: Verbal complex Executive Function: Self Monitoring;Organizing Organizing: Impaired Organizing Impairment: Verbal complex;Functional complex Self Monitoring: Impaired Self Monitoring Impairment: Verbal complex;Functional complex Safety/Judgment: Appears intact Sensation Sensation Light Touch: Appears Intact Hot/Cold: Appears Intact Proprioception: Appears Intact Stereognosis: Appears Intact Coordination Gross Motor Movements are Fluid and Coordinated: No Fine Motor Movements are Fluid and Coordinated: No Coordination and Movement Description: Slight ataxia and decreased speed noted with finger to nose testing with the LUE.  He was able to use the LUE at a diminished level with selfcare tasks. Motor  Motor Motor: Abnormal postural alignment and control Motor - Skilled Clinical Observations: ataxia, bias toward L in standing/sitting  Trunk/Postural Assessment  Cervical  Assessment Cervical Assessment: Exceptions to [facility] (forward head with history of cervical dystonia) Thoracic Assessment Thoracic Assessment: Exceptions to [facility] (thoracic rounding) Lumbar Assessment Lumbar Assessment: Exceptions to [facility] (posterior pelvic tilt) Postural Control Postural Control: Deficits on evaluation Righting Reactions: delayed and inadequate Protective Responses: delayed and inadequate  Balance Balance Balance Assessed: Yes Static Sitting Balance Static Sitting - Balance Support: Feet supported Static Sitting - Level of Assistance: 5: Stand by assistance Dynamic Sitting Balance Dynamic Sitting - Balance Support: During functional activity Dynamic Sitting - Level of Assistance:  (contact guard) Static Standing Balance Static Standing - Balance Support: Bilateral upper extremity supported Static Standing - Level of Assistance: 4: Min assist Dynamic Standing Balance Dynamic Standing - Balance Support: During functional activity Dynamic Standing - Level of Assistance: 3: Mod assist Extremity/Trunk Assessment RUE Assessment RUE Assessment: Within Functional Limits LUE Assessment LUE Assessment: Exceptions to [facility] Passive Range of Motion (PROM) Comments: WFLS Active Range of Motion (AROM) Comments: WFLS General Strength Comments: Strength 4/5  throughout.  Ataxia noted with finger to nose testing but uses functionally at a diminshed level.  Decreased FM coordination noted as well with pt losing the soap out of his left hand when trying to apply it to the washcloth.  Care Tool Care Tool Self Care Eating   Eating Assist Level: Independent    Oral Care    Oral Care Assist Level: Set up assist    Bathing   Body parts bathed by patient: Right arm;Left arm;Chest;Abdomen;Front perineal area;Buttocks;Right upper leg;Left upper leg;Face;Right lower leg;Left lower leg     Assist Level: Minimal Assistance - Patient > 75%    Upper Body Dressing(including orthotics)   What  is the patient wearing?: Pull over shirt   Assist Level: Set up assist    Lower Body Dressing (excluding footwear)   What is the patient wearing?: Pants Assist for lower body dressing: Minimal Assistance - Patient > 75%    Putting on/Taking off footwear   What is the patient wearing?: Non-skid slipper socks;Ted hose Assist for footwear: Moderate Assistance - Patient 50 - 74%       Care Tool Toileting Toileting activity   Assist for toileting: Minimal Assistance - Patient > 75%     Care Tool Bed Mobility Roll left and right activity   Roll left and right assist level: Supervision/Verbal cueing    Sit to lying activity   Sit to lying assist level: Supervision/Verbal cueing    Lying to sitting edge of bed activity   Lying to sitting edge of bed assist level: Supervision/Verbal cueing     Care Tool Transfers Sit to stand transfer   Sit to stand assist level: Minimal Assistance - Patient > 75%    Chair/bed transfer   Chair/bed transfer assist level: Minimal Assistance - Patient > 75%     Toilet transfer Toilet transfer activity did not occur: Safety/medical concerns Assist Level: Minimal Assistance - Patient > 75% (stand pivot RW to [facility])     Care Tool Cognition Expression of Ideas and Wants Expression of Ideas and Wants: Without difficulty (complex and basic) - expresses complex messages without difficulty and with speech that is clear and easy to understand   Understanding Verbal and Non-Verbal Content Understanding Verbal and Non-Verbal Content: Understands (complex and basic) - clear comprehension without cues or repetitions   Memory/Recall Ability *first 3 days only Memory/Recall Ability *first 3 days only: Current season;Location of own room;That he or she is in a hospital/hospital unit;Staff names and faces    Refer to Care Plan for Long Term Goals  SHORT TERM GOAL WEEK 1 OT Short Term Goal 1 (Week 1): Pt will complete LB bathing sit to stand with supervision for 3  consecutive sessions. OT Short Term Goal 2 (Week 1): Pt will complete LB dressing sit to stand with supervision for 3 conseutive sessions. OT Short Term Goal 3 (Week 1): Pt will complete toilet transfers with supervision using RW and 3:1. OT Short Term Goal 4 (Week 1): Pt will use the LUE at a non-dominant level with supervision during selfcare tasks. OT Short Term Goal 5 (Week 1): Pt will report dizziness of less than 3/10 with transitional movements.  Recommendations for other services: None    Skilled Therapeutic Intervention ADL ADL Eating: Independent Where Assessed-Eating: Bed level Grooming: Setup Where Assessed-Grooming: Edge of bed Upper Body Bathing: Supervision/safety Where Assessed-Upper Body Bathing: Edge of bed Lower Body Bathing: Minimal assistance Where Assessed-Lower Body Bathing: Edge of bed Upper Body Dressing: Setup Where  Assessed-Upper Body Dressing: Edge of bed Lower Body Dressing: Minimal assistance Where Assessed-Lower Body Dressing: Edge of bed Toileting: Minimal assistance Where Assessed-Toileting: Bedside Commode Toilet Transfer: Minimal assistance Toilet Transfer Method: Stand pivot Science writer: Radiographer, therapeutic: Not assessed Social research officer, government: Not assessed Mobility  Bed Mobility Bed Mobility: Supine to Sit;Sit to Supine Supine to Sit: Supervision/Verbal cueing Sit to Supine: Supervision/Verbal cueing Transfers Sit to Stand: Minimal Assistance - Patient > 75% Stand to Sit: Minimal Assistance - Patient > 75%  Session Note:  Pt in bed to start without reports of dizziness.  BP taken in supine at 128/81.  He then completed supine to sit with supervision with BP again checked at 140/102.  He reports dizziness in sitting at 1/10 with no nausea.  Had him work on bathing and dressing from the EOB.  He was able to complete UB selfcare with setup assist.  Noted dropping the soap on one occasion when manipulating it with  the left hand.  He reported some numbness in the past in the LUE but it has resolved.  He is aware of the slight increase in ataxia and decreased coordination.  With sit to stand, he needed only min assist with RW in front.  Left bias noted in sitting as well when working.  He reported some light headedness when standing to wash front and back peri area but no dizziness greater than 1/10.  Therapist assisted with applying thigh high TEDs as well as abdominal binder after pt donned his shirt with supervision.  He then donned his pants with min assist.  BP taken in standing at 130/95.  Had him complete stand pivot transfer to the [facility] beside of the bed with the RW at min assist.  Dizziness reported at 3/10.  Also completed functional mobility to the door and back with the RW at min assist.  Took a few steps with max assist hand held away from the bed.  Severe ataxia with gait scissoring noted to finish session.  Returned to supine with supervision to complete  session.   Discussed expectations for LOS and supervision goals.  Pt in agreement with plan.   Discharge Criteria: Patient will be discharged from OT if patient refuses treatment 3 consecutive times without medical reason, if treatment goals not met, if there is a change in medical status, if patient makes no progress towards goals or if patient is discharged from hospital.  The above assessment, treatment plan, treatment alternatives and goals were discussed and mutually agreed upon: by patient  Fadel Clason OTR/L 01/01/2021, 5:38 PM

## 2021-01-01 NOTE — Progress Notes (Signed)
Patient information reviewed and entered into [facility] System by Becky Calah Gershman, PPS coordinator. Information including medical coding, function ability, and quality indicators will be reviewed and updated through discharge.   

## 2021-01-01 NOTE — Evaluation (Signed)
Speech Language Pathology Assessment and Plan  Patient Details  Name: Miguel [name] MRN: [id number] Date of Birth: Nov 16, 1955  SLP Diagnosis: Cognitive Impairments  Rehab Potential: Good ELOS: 10-14 days    Today's Date: 01/01/2021 SLP Individual Time: 1000-1055 SLP Individual Time Calculation (min): 76 min   Hospital Problem: Principal Problem:   Cerebrovascular accident (CVA) of right thalamus ([place]) Active Problems:   Cerebral infarction due to embolism of left cerebellar artery ([place])  Past Medical History:  Past Medical History:  Diagnosis Date  . Cervical dystonia   . Depression   . Erectile dysfunction due to arterial insufficiency   . Functional dyspepsia   . Hypertension   . Pancreatitis 2005   likely secondary to alcohol use  . Rosacea    Past Surgical History:  Past Surgical History:  Procedure Laterality Date  . no past surgery      Assessment / Plan / Recommendation  Miguel [name] is a 65 year old right-handed male with history of cervical dystonia and receives Xeomin injections in the past followed by neurology services Dr. Evelena Leyden at [facility], hypertension, depression, tobacco and alcohol use as well as pancreatitis 2005.  Per chart review patient lives with spouse independent prior to admission.  Two-level home half bath on main level and bathroom upstairs.  Patient recently lost his job 3 weeks ago.  Presented 12/18/2020 with persistent dizziness x1 week as well as left-sided ataxia.  Patient had reported on 12/16/2020 while riding his lawnmower and struck a tree without loss of consciousness.  Cranial CT scan showed no acute traumatic injury identified.  CT cervical spine no acute traumatic injury identified.  Patient did not receive tPA.  MRI showed acute infarct left inferior posterior cerebellum.  Small acute infarct right thalamus.  CT angiogram head and neck showed moderate to severe stenosis origin of the left vertebral artery.  Occlusion distal  left vertebral artery at C1.  Atherosclerosis disease in the carotid bifurcation bilaterally 50% diameter stenosis proximal right internal carotid artery.  No significant left carotid stenosis.  Echocardiogram with ejection fraction 45 to 50% mild global hypokinesis, mild MR without AS.  Admission chemistries unremarkable except sodium 134 glucose 165 urinalysis negative nitrite urine drug screen positive marijuana.  Presently maintained on Plavix for CVA prophylaxis as well as Pletal 100 mg twice daily x3 weeks then Plavix alone.  Subcutaneous Lovenox for DVT prophylaxis.  Maintain on a regular consistency diet.  Therapy evaluations completed due to patient decreased functional mobility he was admitted for a comprehensive rehabilitation program. Clinical Impression Patient presents with a mild-mod cognitive disorder with specific deficits in short term recall (appears mainly a retrieval deficit), awareness to deficits and errors made, decreased self-monitoring and decreased organization during problem solving tasks. He did demonstrate some awareness to difficulty with simple addition/subrtraction problems saying "I should be able to rattle these off" and after brief delay from when he told SLP that he was told he would be here on CIR for "6 weeks", saying, "Oh no not 6 weeks, 6-10 days". He did not exhibit awareness to overall performance on ALFA subtest Daily Math Problems saying he felt he had done pretty well but in fact he only was correct on 6 of 10 problems. He was not able to recall any of the 4 words after 3 minute delay but did recall 2/4 with category cue. Patient was receptive to working with cognitive tasks in future SLP sessions and he did acknowedge some difficulty with cognitive processing and memory. Currently,  patient is not safe to return to PLOF and is requiring 24 hour supervision and assistance for safety as well as skilled SLP intervention to maximize his cognitive function prior to discharge  home.  Skilled Therapeutic Interventions          Speech-language cognitive evaluation  SLP Assessment  Patient will need skilled Speech Lanaguage Pathology Services during CIR admission    Recommendations  Recommendations for Other Services: Neuropsych consult Follow up Recommendations: Other (comment) (TBD) Equipment Recommended: None recommended by SLP    SLP Frequency 3 to 5 out of 7 days   SLP Duration  SLP Intensity  SLP Treatment/Interventions 10-14 days  Minumum of 1-2 x/day, 30 to 90 minutes  Cognitive remediation/compensation;Functional tasks;Patient/family education;Medication managment    Pain Pain Assessment Pain Scale: 0-10 Pain Score: 0-No pain  Prior Functioning Cognitive/Linguistic Baseline: Within functional limits Type of Home: House  Lives With: Spouse Available Help at Discharge: Family Education: "9th grade + GED" 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; Associate Professor)  SLP Evaluation Cognition Overall Cognitive Status: Impaired/Different from baseline Arousal/Alertness: Awake/alert Orientation Level: Oriented X4 Attention: Sustained;Selective Sustained Attention: Appears intact Selective Attention: Impaired Selective Attention Impairment: Verbal complex;Functional basic Memory: Impaired Memory Impairment: Retrieval deficit;Other (comment) (recalled 2/4 words with category cue) Awareness: Impaired Awareness Impairment: Emergent impairment Problem Solving: Impaired Problem Solving Impairment: Verbal complex Executive Function: Self Monitoring;Organizing Organizing: Impaired Organizing Impairment: Verbal complex;Functional complex Self Monitoring: Impaired Self Monitoring Impairment: Verbal complex;Functional complex Safety/Judgment: Appears intact  Comprehension Auditory Comprehension Overall Auditory Comprehension: Appears within functional limits for tasks assessed Expression Expression Primary Mode of Expression:  Verbal Verbal Expression Overall Verbal Expression: Appears within functional limits for tasks assessed Oral Motor Oral Motor/Sensory Function Overall Oral Motor/Sensory Function: Within functional limits Motor Speech Overall Motor Speech: Appears within functional limits for tasks assessed  Care Tool Care Tool Cognition Expression of Ideas and Wants Expression of Ideas and Wants: Without difficulty (complex and basic) - expresses complex messages without difficulty and with speech that is clear and easy to understand   Understanding Verbal and Non-Verbal Content Understanding Verbal and Non-Verbal Content: Understands (complex and basic) - clear comprehension without cues or repetitions   Memory/Recall Ability *first 3 days only Memory/Recall Ability *first 3 days only: Current season;Location of own room;That he or she is in a hospital/hospital unit;Staff names and faces      Short Term Goals: Week 1: SLP Short Term Goal 1 (Week 1): Patient will demonstrate awareness to errors during functional mild complex problem solving tasks with minA cues SLP Short Term Goal 2 (Week 1): Patient will demonstrate recall of specific therapy tasks and medical interventions completed during the day with minA cues. SLP Short Term Goal 3 (Week 1): Patient will perform medication management tasks with minA cues for accuracy. SLP Short Term Goal 4 (Week 1): Patient will answer delayed recall questions based on paragraph level text, with 80% accuracy and minA cues.  Refer to Care Plan for Long Term Goals  Recommendations for other services: Neuropsych  Discharge Criteria: Patient will be discharged from SLP if patient refuses treatment 3 consecutive times without medical reason, if treatment goals not met, if there is a change in medical status, if patient makes no progress towards goals or if patient is discharged from hospital.  The above assessment, treatment plan, treatment alternatives and goals  were discussed and mutually agreed upon: by patient  Sonia Baller, MA, CCC-SLP Speech Therapy

## 2021-01-01 NOTE — Evaluation (Signed)
Physical Therapy Assessment and Plan  Patient Details  Name: Miguel [name] MRN: [id number] Date of Birth: Sep 25, 1955  PT Diagnosis: Abnormal posture, Abnormality of gait, Ataxia, Ataxic gait, Cognitive deficits, Coordination disorder and Dizziness and giddiness Rehab Potential: Good ELOS: 2-2.5   Today's Date: 01/01/2021 PT Individual Time: 1300-1400 PT Individual Time Calculation (min): 60 min    Hospital Problem: Principal Problem:   Cerebrovascular accident (CVA) of right thalamus ([place]) Active Problems:   Cerebral infarction due to embolism of left cerebellar artery ([place])   Past Medical History:  Past Medical History:  Diagnosis Date  . Cervical dystonia   . Depression   . Erectile dysfunction due to arterial insufficiency   . Functional dyspepsia   . Hypertension   . Pancreatitis 2005   likely secondary to alcohol use  . Rosacea    Past Surgical History:  Past Surgical History:  Procedure Laterality Date  . no past surgery      Assessment & Plan Clinical Impression: Patient is a 65 y.o. year old male with history of cervical dystonia and receives Xeomin injections in the past followed by neurology services Dr. Evelena Leyden at [facility], hypertension, depression, tobacco and alcohol use as well as pancreatitis 2005.  Per chart review patient lives with spouse independent prior to admission.  Two-level home half bath on main level and bathroom upstairs.  Patient recently lost his job 3 weeks ago.  Presented 12/18/2020 with persistent dizziness x1 week as well as left-sided ataxia.  Patient had reported on 12/16/2020 while riding his lawnmower and struck a tree without loss of consciousness.  Cranial CT scan showed no acute traumatic injury identified.  CT cervical spine no acute traumatic injury identified.  Patient did not receive tPA.  MRI showed acute infarct left inferior posterior cerebellum.  Small acute infarct right thalamus.  CT angiogram head and neck showed  moderate to severe stenosis origin of the left vertebral artery.  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  Atherosclerosis disease in the carotid bifurcation bilaterally 50% diameter stenosis proximal right internal carotid artery.  No significant left carotid stenosis.  Echocardiogram with ejection fraction 45 to 50% mild global hypokinesis, mild MR without AS.  Admission chemistries unremarkable except sodium 134 glucose 165 urinalysis negative nitrite urine drug screen positive marijuana.  Presently maintained on Plavix for CVA prophylaxis as well as Pletal 100 mg twice daily x3 weeks then Plavix alone.  Subcutaneous Lovenox for DVT prophylaxis.  Maintain on a regular consistency diet.  Therapy evaluations completed due to patient decreased functional mobility he was admitted for a comprehensive rehabilitation program. Patient currently requires min with mobility secondary to muscle weakness, decreased cardiorespiratoy endurance, decreased midline orientation and decreased motor planning, central origin and peripheral and decreased sitting balance, decreased standing balance, decreased postural control and decreased balance strategies.  Prior to hospitalization, patient was independent  with mobility and lived with Spouse in a House home.  Home access is 3 (1 step into house from garage)Stairs to enter.  Patient will benefit from skilled PT intervention to maximize safe functional mobility, minimize fall risk and decrease caregiver burden for planned discharge home with 24 hour supervision.  Anticipate patient will benefit from follow up OP at discharge.  PT - End of Session Activity Tolerance: Tolerates 30+ min activity with multiple rests Endurance Deficit: Yes PT Assessment Rehab Potential (ACUTE/IP ONLY): Good PT Barriers to Discharge: Decreased caregiver support;Home environment access/layout PT Barriers to Discharge Comments: significant orthostatic hypotension (?) that results in  vertigo  (?) PT Patient demonstrates impairments in the following area(s): Balance;Endurance;Motor;Nutrition;Perception;Safety;Sensory;Skin Integrity PT Transfers Functional Problem(s): Bed Mobility;Bed to Chair;Car;Furniture PT Locomotion Functional Problem(s): Ambulation;Stairs PT Plan PT Intensity: Minimum of 1-2 x/day ,45 to 90 minutes PT Frequency: 5 out of 7 days PT Duration Estimated Length of Stay: 2-2.5 PT Treatment/Interventions: Ambulation/gait training;Discharge planning;Functional mobility training;Psychosocial support;Therapeutic Activities;Visual/perceptual remediation/compensation;Balance/vestibular training;Disease management/prevention;Neuromuscular re-education;Skin care/wound management;Therapeutic Exercise;Wheelchair propulsion/positioning;Cognitive remediation/compensation;DME/adaptive equipment instruction;Pain management;Splinting/orthotics;UE/LE Strength taining/ROM;Community reintegration;Functional electrical stimulation;Patient/family education;Stair training;UE/LE Coordination activities PT Transfers Anticipated Outcome(s): ModI PT Locomotion Anticipated Outcome(s): supervision PT Recommendation Recommendations for Other Services: Vestibular eval Follow Up Recommendations: Outpatient PT;24 hour supervision/assistance Patient destination: Home Equipment Recommended: To be determined   PT Evaluation Precautions/Restrictions Precautions Precautions: Fall;Other (comment) Precaution Comments: orthostatic hypotension which triggers "vertigo"--be extremely careful; thigh high TED hose, Abd binder Restrictions Weight Bearing Restrictions: No Home Living/Prior Functioning Home Living Available Help at Discharge: Family Type of Home: House Home Access: Stairs to enter [employer] of Steps: 3 (1 step into house from garage) Entrance Stairs-Rails: None Home Layout: 1/2 bath on main level;Bed/bath upstairs;Two level Alternate Level Stairs-Number of Steps: 7 steps,  landing, 7 steps Alternate Level Stairs-Rails: Right Bathroom Shower/Tub: Multimedia programmer: Standard Bathroom Accessibility: Yes Additional Comments: walker would not fit in 1/2 bath on 1st floor  Lives With: Spouse Prior Function Level of Independence: Independent with basic ADLs;Independent with homemaking with ambulation;Independent with transfers;Independent with gait  Able to Take Stairs?: Yes Driving: Yes Vision/Perception  Vision - Assessment Eye Alignment: Within Functional Limits Ocular Range of Motion: Within Functional Limits Alignment/Gaze Preference: Within Defined Limits Tracking/Visual Pursuits: Able to track stimulus in all quads without difficulty Saccades: Within functional limits Convergence: Within functional limits Perception Perception: Within Functional Limits Praxis Praxis: Intact  Cognition Overall Cognitive Status: Within Functional Limits for tasks assessed Arousal/Alertness: Awake/alert Orientation Level: Oriented X4 Sustained Attention: Appears intact Selective Attention: Impaired Memory: Appears intact Problem Solving: Appears intact Safety/Judgment: Appears intact Sensation Sensation Light Touch: Appears Intact Hot/Cold: Not tested Proprioception: Appears Intact Stereognosis: Appears Intact Coordination Gross Motor Movements are Fluid and Coordinated: No Fine Motor Movements are Fluid and Coordinated: No Heel Shin Test: mildly dysmetric L LE Motor  Motor Motor: Abnormal tone;Abnormal postural alignment and control Motor - Skilled Clinical Observations: ataxia, bias toward L in standing/sitting   Trunk/Postural Assessment  Cervical Assessment Cervical Assessment: Exceptions to [facility] (h/o cervical dystonia (head at midline with slight forward head at eval)) Thoracic Assessment Thoracic Assessment: Exceptions to [facility] (increased kyphosis) Lumbar Assessment Lumbar Assessment: Exceptions to [facility] (posterior pelvic tilt) Postural  Control Postural Control: Deficits on evaluation Righting Reactions: delayed and inadequate Protective Responses: delayed and inadequate  Balance Balance Balance Assessed: Yes Static Sitting Balance Static Sitting - Balance Support: Feet supported Static Sitting - Level of Assistance: 5: Stand by assistance (Slight L lateral lean noted) Dynamic Sitting Balance Dynamic Sitting - Balance Support: Feet supported;During functional activity Dynamic Sitting - Level of Assistance:  (CGA) Static Standing Balance Static Standing - Balance Support: Bilateral upper extremity supported Static Standing - Level of Assistance: 4: Min assist Dynamic Standing Balance Dynamic Standing - Balance Support: During functional activity;Bilateral upper extremity supported Dynamic Standing - Level of Assistance: 3: Mod assist Extremity Assessment      RLE Assessment RLE Assessment: Exceptions to [facility] General Strength Comments: grossly 5/5 with mild incoordination LLE Assessment LLE Assessment: Exceptions to [facility] General Strength Comments: grossly 5/5 with mild incoordination L >R  Care Tool Care Tool Bed Mobility Roll left and  right activity   Roll left and right assist level: Supervision/Verbal cueing    Sit to lying activity   Sit to lying assist level: Supervision/Verbal cueing    Lying to sitting edge of bed activity   Lying to sitting edge of bed assist level: Supervision/Verbal cueing     Care Tool Transfers Sit to stand transfer   Sit to stand assist level: Supervision/Verbal cueing    Chair/bed transfer   Chair/bed transfer assist level: Minimal Assistance - Patient > 75%     Toilet transfer Toilet transfer activity did not occur: Safety/medical concerns      Scientist, product/process development transfer activity did not occur: Safety/medical concerns        Care Tool Locomotion Ambulation   Assist level: Moderate Assistance - Patient 50 - 74% Assistive device: Walker-rolling Max distance: 12   Walk 10 feet activity   Assist level: Moderate Assistance - Patient - 50 - 74% Assistive device: Walker-rolling   Walk 50 feet with 2 turns activity Walk 50 feet with 2 turns activity did not occur: Safety/medical concerns      Walk 150 feet activity Walk 150 feet activity did not occur: Safety/medical concerns      Walk 10 feet on uneven surfaces activity Walk 10 feet on uneven surfaces activity did not occur: Safety/medical concerns      Stairs Stair activity did not occur: Safety/medical concerns        Walk up/down 1 step activity Walk up/down 1 step or curb (drop down) activity did not occur: Safety/medical concerns     Walk up/down 4 steps activity did not occuR: Safety/medical concerns  Walk up/down 4 steps activity      Walk up/down 12 steps activity Walk up/down 12 steps activity did not occur: Safety/medical concerns      Pick up small objects from floor Pick up small object from the floor (from standing position) activity did not occur: Safety/medical concerns      Wheelchair Will patient use wheelchair at discharge?: No          Wheel 50 feet with 2 turns activity      Wheel 150 feet activity        Refer to Care Plan for Long Term Goals  SHORT TERM GOAL WEEK 1 PT Short Term Goal 1 (Week 1): Patient will complete bed <> wc transfers with LRAD and CGA PT Short Term Goal 2 (Week 1): Patient will ambulate >63f with LRAD and MinA PT Short Term Goal 3 (Week 1): Patient will initiate stair training  Recommendations for other services: Other: vestibular eval  Skilled Therapeutic Intervention Mobility Bed Mobility Bed Mobility: Rolling Right;Rolling Left;Supine to Sit;Sit to Supine Rolling Right: Supervision/verbal cueing Rolling Left: Supervision/Verbal cueing Supine to Sit: Supervision/Verbal cueing Sit to Supine: Supervision/Verbal cueing Transfers Transfers: Sit to Stand;Stand to Sit;Stand Pivot Transfers Sit to Stand: Minimal Assistance - Patient  > 75% Stand to Sit: Minimal Assistance - Patient > 75% Stand Pivot Transfers: Minimal Assistance - Patient > 75% Stand Pivot Transfer Details: Verbal cues for safe use of DME/AE;Verbal cues for gait pattern;Verbal cues for precautions/safety Transfer (Assistive device): Rolling walker Locomotion  Gait Ambulation: Yes Gait Assistance: Moderate Assistance - Patient 50-74%;Minimal Assistance - Patient > 75% Gait Distance (Feet): 12 Feet Assistive device: Rolling walker Gait Assistance Details: Verbal cues for safe use of DME/AE;Verbal cues for gait pattern;Verbal cues for precautions/safety Gait Gait: Yes Gait Pattern: Impaired Gait Pattern: Step-to pattern;Step-through pattern;Ataxic;Trunk flexed;Lateral trunk lean to left Gait  velocity: decreased Stairs / Additional Locomotion Stairs: No Wheelchair Mobility Wheelchair Mobility: No  Patient received recline din bed, finishing lunch, agreeable to PT eval. He denies pain. Patient denies dizziness with immediate position changes, such as rolling in bed throughout eval. Patient currently with thigh high TED hose on and abdominal binder donned in sitting. He required grossly MinA/ModA throughout eval for functional mobility using RW. He demonstrates mild LE ataxia, L >R. Patient with slight L lateral bias in standing/sitting. Patient ambulated 6f with RW. Upon sitting, patient began to list R- he was unable to return to midline. Patient reporting need to lay down due to feeling dizzy. Strong R lateral lean with centrifuge-like power forcing patient to lay down. BP unable to be assessed due to PT needing to remain with patient in empty gym and vitals machine being inaccessible at the time. No nystagmus noted. After resting for ~2 mins, patient reported dissipation of dizziness and was agreeable to ambulate back to wc. No "episode" of dizziness when ambulating back to his chair. Patient returning to his room transferring back to bed via stand pivot  with MinA and RW. Bed alarm on, call light within reach.   Discharge Criteria: Patient will be discharged from PT if patient refuses treatment 3 consecutive times without medical reason, if treatment goals not met, if there is a change in medical status, if patient makes no progress towards goals or if patient is discharged from hospital.  The above assessment, treatment plan, treatment alternatives and goals were discussed and mutually agreed upon: by patient and by family  JDebbora Dus4/29/2022, 2:31 PM

## 2021-01-01 NOTE — Progress Notes (Signed)
PROGRESS NOTE   Subjective/Complaints:  Pt states BP dropped yesterday an dhe became dizzy while up.  Feels ok this am .  Abd binder heped with dizziness yesterday . Had this happen at home once as well   ROS- neg cp. SOB, N/V/D Objective:   No results found. Recent Labs    12/31/20 0354 12/31/20 1503  WBC 5.9 5.5  HGB 13.5 13.2  HCT 37.5* 36.4*  PLT 260 268   Recent Labs    12/31/20 0354  NA 128*  K 3.3*  CL 93*  CO2 25  GLUCOSE 170*  BUN 13  CREATININE 0.89  CALCIUM 9.1    Intake/Output Summary (Last 24 hours) at 01/01/2021 4782 Last data filed at 12/31/2020 1815 Gross per 24 hour  Intake 240 ml  Output --  Net 240 ml        Physical Exam: Vital Signs Blood pressure 135/85, pulse 100, temperature 99 F (37.2 C), resp. rate 17, weight 90.2 kg, SpO2 97 %.   General: No acute distress Mood and affect are appropriate Heart: Regular rate and rhythm no rubs murmurs or extra sounds Lungs: Clear to auscultation, breathing unlabored, no rales or wheezes Abdomen: Positive bowel sounds, soft nontender to palpation, nondistended Extremities: No clubbing, cyanosis, or edema, ecchymosis non tender around Left great toenail  Skin: No evidence of breakdown, no evidence of rash Neurologic: Cranial nerves II through XII intact, motor strength is 5/5 in bilateral deltoid, bicep, tricep, grip, hip flexor, knee extensors, ankle dorsiflexor and plantar flexor Sensory exam normal sensation to light touch and proprioception in bilateral upper and lower extremities Cerebellar exam normal finger to nose to finger as well as heel to shin in RIght  upper and lower extremities, mild/mod dysmetria left UE, normal heel shin LLE  Musculoskeletal: Full range of motion in all 4 extremities. No joint swelling   Assessment/Plan: 1. Functional deficits which require 3+ hours per day of interdisciplinary therapy in a comprehensive  inpatient rehab setting.  Physiatrist is providing close team supervision and 24 hour management of active medical problems listed below.  Physiatrist and rehab team continue to assess barriers to discharge/monitor patient progress toward functional and medical goals  Care Tool:  Bathing              Bathing assist       Upper Body Dressing/Undressing Upper body dressing   What is the patient wearing?: Pull over shirt    Upper body assist Assist Level: Minimal Assistance - Patient > 75%    Lower Body Dressing/Undressing Lower body dressing      What is the patient wearing?: Pants     Lower body assist Assist for lower body dressing: Minimal Assistance - Patient > 75%     Toileting Toileting    Toileting assist Assist for toileting: Minimal Assistance - Patient > 75%     Transfers Chair/bed transfer  Transfers assist           Locomotion Ambulation   Ambulation assist              Walk 10 feet activity   Assist  Walk 50 feet activity   Assist           Walk 150 feet activity   Assist           Walk 10 feet on uneven surface  activity   Assist           Wheelchair     Assist               Wheelchair 50 feet with 2 turns activity    Assist            Wheelchair 150 feet activity     Assist          Blood pressure 135/85, pulse 100, temperature 99 F (37.2 C), resp. rate 17, weight 90.2 kg, SpO2 97 %.  Medical Problem List and Plan: 1.  Limb truncal ataxia/central vestibular disorder secondary to acute left cerebellar and right thalamus ischemic infarct             -patient may shower             -ELOS/Goals:7-10 days supervision to Mod I 2.  Antithrombotics: -DVT/anticoagulation: Lovenox             -antiplatelet therapy: Plavix 75 mg daily and Pletal 100 mg twice daily x3 weeks then Plavix alone 3. Pain Management: Tramadol as needed 4. Mood: Wellbutrin 150 mg daily,  Klonopin 1 mg daily, Effexor 75 mg daily, Ativan as needed             -antipsychotic agents: N/A 5. Neuropsych: This patient is capable of making decisions on his own behalf. 6. Skin/Wound Care: Routine skin checks 7. Fluids/Electrolytes/Nutrition: Routine in and outs with follow-up chemistries 8.  Hypertension.  Avapro 75 mg daily, Norvasc 10 mg daily.  Monitor with increased mobility Vitals:   12/31/20 2010 01/01/21 0423  BP: 128/89 135/85  Pulse: 98 100  Resp: 18 17  Temp: 98.4 F (36.9 C) 99 F (37.2 C)  SpO2: 97% 97%  check orthostatic vitals 9.  Hyperlipidemia.  Lipitor 10.  Cervical dystonia.  Patient has been followed in the past by Dr. Evelena Leyden and now Dr Gaye Alken in [place] at [facility] and has received Xeomin in the past 11.  History of tobacco alcohol use.  Alcohol negative on admission.  Provide counseling 12.  BPH.  Flomax 0.4 mg daily. 13. Eczema- might benefit from steroid creams  14.  HypoK mild add KCL 47meq po qd recheck 5/2  LOS: 1 days A FACE TO FACE EVALUATION WAS PERFORMED  Charlett Blake 01/01/2021, 6:25 AM

## 2021-01-01 NOTE — Progress Notes (Signed)
[facility] Individual Statement of Services  Patient Name:  Miguel [name]  Date:  01/01/2021  Welcome to the [facility].  Our goal is to provide you with an individualized program based on your diagnosis and situation, designed to meet your specific needs.  With this comprehensive rehabilitation program, you will be expected to participate in at least 3 hours of rehabilitation therapies Monday-Friday, with modified therapy programming on the weekends.  Your rehabilitation program will include the following services:  Physical Therapy (PT), Occupational Therapy (OT), 24 hour per day rehabilitation nursing, Neuropsychology, Care Coordinator, Rehabilitation Medicine, Nutrition Services and Pharmacy Services  Weekly team conferences will be held on Wednesday to discuss your progress.  Your Inpatient Rehabilitation Care Coordinator will talk with you frequently to get your input and to update you on team discussions.  Team conferences with you and your family in attendance may also be held.  Expected length of stay: 2-2.5 weeks  Overall anticipated outcome: supervision with cues  Depending on your progress and recovery, your program may change. Your Inpatient Rehabilitation Care Coordinator will coordinate services and will keep you informed of any changes. Your Inpatient Rehabilitation Care Coordinator's name and contact numbers are listed  below.  The following services may also be recommended but are not provided by the [facility] will be made to provide these services after discharge if needed.  Arrangements include referral to agencies that provide these services.  Your insurance has been verified to be:  [place] Your primary doctor is:  Josetta Huddle  Pertinent information will be shared with your doctor and your  insurance company.  Inpatient Rehabilitation Care Coordinator:  Erlene Quan, [place] or [phone]  Information discussed with and copy given to patient by: Elease Hashimoto, 01/01/2021, 9:24 AM

## 2021-01-01 NOTE — Plan of Care (Signed)
  Problem: Consults Goal: RH STROKE PATIENT EDUCATION Description: See Patient Education module for education specifics  Outcome: Progressing   Problem: RH BOWEL ELIMINATION Goal: RH STG MANAGE BOWEL WITH ASSISTANCE Description: STG Manage Bowel with mod I Assistance. Outcome: Progressing Goal: RH STG MANAGE BOWEL W/MEDICATION W/ASSISTANCE Description: STG Manage Bowel with Medication with mod I  Assistance. Outcome: Progressing   Problem: RH SAFETY Goal: RH STG ADHERE TO SAFETY PRECAUTIONS W/ASSISTANCE/DEVICE Description: STG Adhere to Safety Precautions With cues/reminders Assistance/Device. Outcome: Progressing   Problem: RH PAIN MANAGEMENT Goal: RH STG PAIN MANAGED AT OR BELOW PT'S PAIN GOAL Description: At or below level 4 Outcome: Progressing   Problem: RH KNOWLEDGE DEFICIT Goal: RH STG INCREASE KNOWLEDGE OF DIABETES Description: Patient and spouse will be able to manage prediabetes with dietary modifications using handouts and educational materials independently. Outcome: Progressing Goal: RH STG INCREASE KNOWLEDGE OF HYPERTENSION Description: Patient and spouse will be able to manage HTN with medications and dietary modifications using handouts and educational materials independently. Outcome: Progressing Goal: RH STG INCREASE KNOWLEGDE OF HYPERLIPIDEMIA Description: Patient and spouse will be able to manage HLD with medications and dietary modifications using handouts and educational materials independently. Outcome: Progressing Goal: RH STG INCREASE KNOWLEDGE OF STROKE PROPHYLAXIS Description: Patient and spouse will be able to manage secondary stroke risks with medications and dietary modifications using handouts and educational materials independently. Outcome: Progressing

## 2021-01-01 NOTE — Progress Notes (Signed)
Physical Therapy Session Note  Patient Details  Name: Miguel [name] MRN: [id number] Date of Birth: [birth year]  Today's Date: 01/01/2021 PT Individual Time: 1445-1500 PT Individual Time Calculation (min): 15 min   Short Term Goals: Week 1:  PT Short Term Goal 1 (Week 1): Patient will complete bed <> wc transfers with LRAD and CGA PT Short Term Goal 2 (Week 1): Patient will ambulate >51ft with LRAD and MinA PT Short Term Goal 3 (Week 1): Patient will initiate stair training  Skilled Therapeutic Interventions/Progress Updates:   Pt received supine in bed and agreeable to PT. Supine>sit transfer with supervision  assist and cues to reduce use of bed rail as tolerated. Pt assisted to don abdominal binder with mod assist in sitting. Sit>stand with min assist for safety. Pt performed standing tolerance x 1 minute with CGA from PT. No s/s of vertigo or orthostatic hypotension. Gait training x 5ft with min assist with mild ataxia. Pt then performed feeling dizzy and required total A to prevent LOB to the R and returned to sitting EOB  To prevent Fall. Pt then returned to supine with supervision assist and left in bed with call bell in reach and all needs met.       Therapy Documentation Precautions:  Precautions Precautions: Fall,Other (comment) Precaution Comments: orthostatic hypotension which triggers "vertigo"--be extremely careful; thigh high TED hose, Abd binder Restrictions Weight Bearing Restrictions: No RLE Weight Bearing: Weight bearing as tolerated LLE Weight Bearing: Weight bearing as tolerated Vital Signs: Therapy Vitals Temp: 98.3 F (36.8 C) Pulse Rate: (!) 104 Resp: 16 BP: 133/83 Patient Position (if appropriate): Lying Oxygen Therapy SpO2: 99 % O2 Device: Room Air Pain: denies    Therapy/Group: Individual Therapy  Lorie Phenix 01/01/2021, 6:40 PM

## 2021-01-01 NOTE — Progress Notes (Signed)
Inpatient Rehabilitation Care Coordinator Assessment and Plan Patient Details  Name: Miguel [name] MRN: [id number] Date of Birth: [birth year]  Today's Date: 01/01/2021  Hospital Problems: Principal Problem:   Cerebrovascular accident (CVA) of right thalamus [facility]) Active Problems:   Cerebral infarction due to embolism of left cerebellar artery [facility])  Past Medical History:  Past Medical History:  Diagnosis Date  . Cervical dystonia   . Depression   . Erectile dysfunction due to arterial insufficiency   . Functional dyspepsia   . Hypertension   . Pancreatitis 2005   likely secondary to alcohol use  . Rosacea    Past Surgical History:  Past Surgical History:  Procedure Laterality Date  . no past surgery     Social History:  reports that he has quit smoking. He has never used smokeless tobacco. He reports current alcohol use. He reports that he does not use drugs.  Family / Support Systems Marital Status: Married Patient Roles: Spouse Spouse/Significant Other: Blanch Media [phone] Other Supports: Friends Anticipated Caregiver: Blanch Media and frinds coming from [facility] to assist Ability/Limitations of Caregiver: Wife is a Therapist, sports but does work in a [place] office Caregiver Availability: 24/7 (short time) Family Dynamics: Close with family and friends, feels has good supports. Pt is one who has always been independent and wants to get back to this level  Social History Preferred language: English Religion:  Cultural Background: No issues Education: HS Read: Yes Write: Yes Employment Status: Unemployed Date Retired/Disabled/Unemployed: lost job three weeks ago Public relations account executive Issues: No issues Guardian/Conservator: None-according to MD pt is capable of making his own decisions while here   Abuse/Neglect Abuse/Neglect Assessment Can Be Completed: Yes Physical Abuse: Denies Verbal Abuse: Denies Sexual Abuse: Denies Exploitation of patient/patient's resources:  Denies Self-Neglect: Denies  Emotional Status Pt's affect, behavior and adjustment status: Pt is motivated to do well and hopes to get better with his vertigo. He has been striggling with this for a while and has fallen as a result of this. He wants to be as independent as possible before DC Recent Psychosocial Issues: recent loss of job Psychiatric History: No history will ask neuro-psych to see due to loss of job and CVA now. Substance Abuse History: Positive for THC-feels not an issue  Patient / Family Perceptions, Expectations & Goals Pt/Family understanding of illness & functional limitations: Pt and wife can explain his stroke and deficits, both have spoken with the MD and feel have a good understanding of treatment plan going forward. Premorbid pt/family roles/activities: Husband, friend, church member, etc Anticipated changes in roles/activities/participation: resume Pt/family expectations/goals: Pt states: " I want to be able to get around and not be so dizzy doing it." Wife states: " I hope he can get his dizziness under control."  [employer]: None Premorbid Home Care/DME Agencies: None Transportation available at discharge: wife Resource referrals recommended: Neuropsychology  Discharge Planning Living Arrangements: Spouse/significant other Support Systems: Spouse/significant other,Friends/neighbors,Church/faith community Type of Residence: Private residence Insurance underwriter Resources: Multimedia programmer (specify) Nurse, mental health) Financial Resources: Family Support Financial Screen Referred: No Living Expenses: Own Money Management: Patient,Spouse Does the patient have any problems obtaining your medications?: No Home Management: Both Patient/Family Preliminary Plans: Return home with wife who works in a [place] office but has friends from [facility] coming to assist him while she is working. Will have 24/7 for a short time. Aware team evaluating today and setting goals  with Care Coordinator Anticipated Follow Up Needs: HH/OP  Clinical Impression Pleasant gentleman who is motivated to dow  ell here and regain his independence. His wife is a Therapist, sports and can assist, along with friends coming from [facility] to assist with his care. Will ask neuro-psych to see while herre for coping. Follow along for discharge needs.  Elease Hashimoto 01/01/2021, 9:23 AM

## 2021-01-02 MED ORDER — TRAMADOL HCL 50 MG PO TABS
50.0000 mg | ORAL_TABLET | Freq: Four times a day (QID) | ORAL | Status: DC | PRN
  Administered 2021-01-02: 50 mg via ORAL
  Filled 2021-01-02: qty 1

## 2021-01-02 NOTE — Progress Notes (Signed)
Occupational Therapy Session Note  Patient Details  Name: Miguel [name] MRN: [id number] Date of Birth: 1956-07-13  Today's Date: 01/02/2021 OT Individual Time: 8786-7672 OT Individual Time Calculation (min): 58 min    Short Term Goals: Week 1:  OT Short Term Goal 1 (Week 1): Pt will complete LB bathing sit to stand with supervision for 3 consecutive sessions. OT Short Term Goal 2 (Week 1): Pt will complete LB dressing sit to stand with supervision for 3 conseutive sessions. OT Short Term Goal 3 (Week 1): Pt will complete toilet transfers with supervision using RW and 3:1. OT Short Term Goal 4 (Week 1): Pt will use the LUE at a non-dominant level with supervision during selfcare tasks. OT Short Term Goal 5 (Week 1): Pt will report dizziness of less than 3/10 with transitional movements.  Skilled Therapeutic Interventions/Progress Updates:    Pt in bed to start with discussion on wanting to complete toileting and showering if possible.  Therapist agreed to try and he transferred to the EOB with supervision and BP was obtained at 141/85.  Abdominal binder was then donned and he completed standing interval with use of the RW.  No significant dizziness or lightheadedness noted with standing and BP at 123/86.  He was then able to complete functional mobility to the toilet with use of the RW and min assist.  He then completed all aspects of toileting with min assist as well using the grab bar and RW.  He was able to transfer over to the tub bench where he finished undressing and removed his abdominal binder and pullover shirt.  Still with no significant dizziness noted or light headedness.  He completed all bathing in sitting with lateral leans for washing peri area.  Once dried, he transferred squat pivot to the wheelchair with completion of dressing sit to stand at the sink.  Min assist was needed for standing to pull items over his hips with supervision for donning pullover shirt.  Therapist provided  total assist for TEDs and binder then he donned his gripper socks.  Call button and phone in reach with safety alarm belt in place at end of session.   Therapy Documentation Precautions:  Precautions Precautions: Fall,Other (comment) Precaution Comments: orthostatic hypotension which triggers "vertigo"--be extremely careful; thigh high TED hose, Abd binder Restrictions Weight Bearing Restrictions: No RLE Weight Bearing: Weight bearing as tolerated LLE Weight Bearing: Weight bearing as tolerated   Pain: Pain Assessment Pain Scale: Faces Pain Score: 0-No pain Pain Type: Acute pain ADL: See Care Tool Section for some details of mobility and selfcare  Therapy/Group: Individual Therapy  Atsushi Yom OTR/L 01/02/2021, 12:30 PM

## 2021-01-02 NOTE — Progress Notes (Signed)
Physical Therapy Session Note  Patient Details  Name: Miguel [name] MRN: [id number] Date of Birth: 01-18-1956  Today's Date: 01/02/2021 PT Individual Time: 3710-6269 PT Individual Time Calculation (min): 44 min   Short Term Goals: Week 1:  PT Short Term Goal 1 (Week 1): Patient will complete bed <> wc transfers with LRAD and CGA PT Short Term Goal 2 (Week 1): Patient will ambulate >62ft with LRAD and MinA PT Short Term Goal 3 (Week 1): Patient will initiate stair training  Skilled Therapeutic Interventions/Progress Updates:    Pt received supine in bed napping but easily awakens and agreeable to therapy session. Supine>sitting R EOB, supervision. Sitting EOB with supervision while donning shoes set-up assist. Donned abdominal binder. L stand pivot EOB>w/c using RW with min assist for balance.  Transported to/from gym in w/c for time management and energy conservation. Patient participated in [facility] and demonstrates increased fall risk as noted by score of  13/56.  (<36= high risk for falls, close to 100%; 37-45 significant >80%; 46-51 moderate >50%; 52-55 lower >25%). Pt requires increased time to complete the test items due to having to take time and rest each time he returns to sitting as pt relates his sudden onset of "vertigo" to orthostatic hypotension and doesn't want to transition sit<>stand too quickly. During this time therapist provided stroke education based on location of pt's CVA and expected clinical findings and impact on functional mobility. Pt demonstrates significantly high fall risk due to low score on Berg and throughout tests demonstrates L lateral lean, truncal ataxia, impaired BLE coordination, and very stiff/guarded trunk/UE posturing. Pt unable to safely attempt some items on the test due to the severity of his balance impairments. At end of [facility] Test pt performed R squat pivot EOM>w/c with min assist. Pt suddenly becomes nauseous and produces emesis -  therapist provided trash can and cool washcloth - associate sudden onset nausea/emesis with increased balance challenges during session. Transported pt back to room and Ed, RN present to assess patient and provide medications.  R squat pivot w/c>EOB with min assist for balance/steadying. Sit>supine supervision. Pt left supine in bed with alarm on and in the care of Ed, RN.   Therapy Documentation Precautions:  Precautions Precautions: Fall,Other (comment) Precaution Comments: orthostatic hypotension which triggers "vertigo"--be extremely careful; thigh high TED hose, Abd binder Restrictions Weight Bearing Restrictions: No RLE Weight Bearing: Weight bearing as tolerated LLE Weight Bearing: Weight bearing as tolerated  Pain: No reports of pain throughout session.  Balance: Standardized Balance Assessment Standardized Balance Assessment: Berg Balance Test Berg Balance Test Sit to Stand: Able to stand using hands after several tries Standing Unsupported: Able to stand 30 seconds unsupported (requires constant guarding due to possible vertigo) Sitting with Back Unsupported but Feet Supported on Floor or Stool: Able to sit 2 minutes under supervision Stand to Sit: Sits safely with minimal use of hands Transfers: Needs one person to assist Standing Unsupported with Eyes Closed: Needs help to keep from falling (pt reports not feeling comfortable with this) Standing Ubsupported with Feet Together: Needs help to attain position and unable to hold for 15 seconds From Standing, Reach Forward with Outstretched Arm: Loses balance while trying/requires external support From Standing Position, Pick up Object from Floor: Unable to try/needs assist to keep balance (not safe to perform at this time) From Standing Position, Turn to Look Behind Over each Shoulder: Needs supervision when turning Turn 360 Degrees: Needs assistance while turning Standing Unsupported, Alternately Place Feet on  Step/Stool: Needs  assistance to keep from falling or unable to try (requires assistance to sit back onto mat due to LOB and impaired stepping strategy) Standing Unsupported, One Foot in Front: Loses balance while stepping or standing Standing on One Leg: Unable to try or needs assist to prevent fall Total Score: 13    Therapy/Group: Individual Therapy  Tawana Scale , PT, DPT, CSRS  01/02/2021, 3:20 PM

## 2021-01-02 NOTE — Progress Notes (Signed)
Speech Language Pathology Daily Session Note  Patient Details  Name: Miguel [name] MRN: [id number] Date of Birth: [birth year]  Today's Date: 01/02/2021 SLP Individual Time: 1100-1200 SLP Individual Time Calculation (min): 60 min  Short Term Goals: Week 1: SLP Short Term Goal 1 (Week 1): Patient will demonstrate awareness to errors during functional mild complex problem solving tasks with minA cues SLP Short Term Goal 2 (Week 1): Patient will demonstrate recall of specific therapy tasks and medical interventions completed during the day with minA cues. SLP Short Term Goal 3 (Week 1): Patient will perform medication management tasks with minA cues for accuracy. SLP Short Term Goal 4 (Week 1): Patient will answer delayed recall questions based on paragraph level text, with 80% accuracy and minA cues.  Skilled Therapeutic Interventions:Skilled ST services focused on cognitive skills. SLP facilitated mildly complex problem solving and error awareness skills in BID pill organizer task. Pt demonstrated mod I. Pt demonstrated recall of medication name/function/times per day with list then fading to without list given a 2 minute delay with supervision A verbal cues fade to mod I. SLP communicated with pt's wife via phone and provided education on current deficits and goals. All questions answered to satisfaction. Pt was left in room with call bell within reach and chair alarm set. SLP recommends to continue skilled services.     Pain Pain Assessment Pain Score: 0-No pain  Therapy/Group: Individual Therapy  Miguel [name]  [facility] 01/02/2021, 12:15 PM

## 2021-01-02 NOTE — Progress Notes (Signed)
PROGRESS NOTE   Subjective/Complaints: Miguel [name] has no complaints this morning Tolerated therapy well today. No significant dizziness or lightheadedness noted while working with Jeneen Rinks.   ROS: denies dizziness  Objective:   No results found. Recent Labs    12/31/20 0354 12/31/20 1503  WBC 5.9 5.5  HGB 13.5 13.2  HCT 37.5* 36.4*  PLT 260 268   Recent Labs    12/31/20 0354 01/01/21 0509  NA 128* 132*  K 3.3* 4.8  CL 93* 94*  CO2 25 30  GLUCOSE 170* 163*  BUN 13 12  CREATININE 0.89 1.02  CALCIUM 9.1 9.7    Intake/Output Summary (Last 24 hours) at 01/02/2021 1354 Last data filed at 01/01/2021 1811 Gross per 24 hour  Intake 120 ml  Output --  Net 120 ml        Physical Exam: Vital Signs Blood pressure 137/87, pulse 91, temperature 98.2 F (36.8 C), resp. rate 17, weight 90.2 kg, SpO2 98 %. Gen: no distress, normal appearing HEENT: oral mucosa pink and moist, NCAT Cardio: Reg rate Chest: normal effort, normal rate of breathing Abd: soft, non-distended Ext: no edema Psych: pleasant, normal affect Neurologic: Cranial nerves II through XII intact, motor strength is 5/5 in bilateral deltoid, bicep, tricep, grip, hip flexor, knee extensors, ankle dorsiflexor and plantar flexor Sensory exam normal sensation to light touch and proprioception in bilateral upper and lower extremities Cerebellar exam normal finger to nose to finger as well as heel to shin in RIght  upper and lower extremities, mild/mod dysmetria left UE, normal heel shin LLE  Musculoskeletal: Full range of motion in all 4 extremities. No joint swelling   Assessment/Plan: 1. Functional deficits which require 3+ hours per day of interdisciplinary therapy in a comprehensive inpatient rehab setting.  Physiatrist is providing close team supervision and 24 hour management of active medical problems listed below.  Physiatrist and rehab team continue  to assess barriers to discharge/monitor patient progress toward functional and medical goals  Care Tool:  Bathing    Body parts bathed by patient: Right arm,Left arm,Chest,Abdomen,Front perineal area,Buttocks,Right upper leg,Left upper leg,Face,Right lower leg,Left lower leg         Bathing assist Assist Level: Minimal Assistance - Patient > 75%     Upper Body Dressing/Undressing Upper body dressing   What is the patient wearing?: Pull over shirt    Upper body assist Assist Level: Supervision/Verbal cueing    Lower Body Dressing/Undressing Lower body dressing      What is the patient wearing?: Pants     Lower body assist Assist for lower body dressing: Minimal Assistance - Patient > 75%     Toileting Toileting    Toileting assist Assist for toileting: Minimal Assistance - Patient > 75%     Transfers Chair/bed transfer  Transfers assist     Chair/bed transfer assist level: Minimal Assistance - Patient > 75% (FWW)     Locomotion Ambulation   Ambulation assist   Ambulation activity did not occur: N/A  Assist level: Minimal Assistance - Patient > 75% Assistive device: Walker-rolling (front wheel) Max distance: 10'   Walk 10 feet activity   Assist  Assist level: Moderate Assistance - Patient - 50 - 74% Assistive device: Walker-rolling   Walk 50 feet activity   Assist Walk 50 feet with 2 turns activity did not occur: Safety/medical concerns         Walk 150 feet activity   Assist Walk 150 feet activity did not occur: Safety/medical concerns         Walk 10 feet on uneven surface  activity   Assist Walk 10 feet on uneven surfaces activity did not occur: Safety/medical concerns         Wheelchair     Assist Will patient use wheelchair at discharge?: No             Wheelchair 50 feet with 2 turns activity    Assist            Wheelchair 150 feet activity     Assist          Blood pressure  137/87, pulse 91, temperature 98.2 F (36.8 C), resp. rate 17, weight 90.2 kg, SpO2 98 %.  Medical Problem List and Plan: 1. Limb truncal ataxia/central vestibular disorder secondary to acute left cerebellar and right thalamus ischemic infarct -patient may shower -ELOS/Goals:7-10 days supervision to Mod I  -Continue CIR 2. Antithrombotics: -DVT/anticoagulation: Continue Lovenox -antiplatelet therapy: Plavix 75 mg daily and Pletal 100 mg twice daily x3 weeks then Plavix alone 3. Pain Management: Decrease Tramadol to q6H prn 4. Mood: Wellbutrin 150 mg daily, Klonopin 1 mg daily, Effexor 75 mg daily, Ativan as needed -antipsychotic agents: N/A 5. Neuropsych: This patient is capable of making decisions on his own behalf. 6. Skin/Wound Care: Routine skin checks 7. Fluids/Electrolytes/Nutrition: Routine in and outs with follow-up chemistries 8. Hypertension. Continue Avapro 75 mg daily, Norvasc 10 mg daily. Monitor with increased mobility     Vitals:   12/31/20 2010 01/01/21 0423  BP: 128/89 135/85  Pulse: 98 100  Resp: 18 17  Temp: 98.4 F (36.9 C) 99 F (37.2 C)  SpO2: 97% 97%  check orthostatic vitals 9. Hyperlipidemia. Continue Lipitor 10. Cervical dystonia. Patient has been followed in the past by Dr. Evelena Leyden and now Dr Gaye Alken in [facility] and has received Xeomin in the past 11. History of tobacco alcohol use. Alcohol negative on admission. Provide counseling 12. BPH. Flomax 0.4 mg daily. 13. Eczema- might benefit from steroid creams  14.  HypoK mild add KCL 51meq po qd recheck 5/2     LOS: 2 days A FACE TO FACE EVALUATION WAS PERFORMED  Martha Clan P Yohana Bartha 01/02/2021, 1:54 PM

## 2021-01-02 NOTE — Progress Notes (Signed)
Physical Therapy Session Note  Patient Details  Name: Miguel [name] MRN: [id number] Date of Birth: 1956/03/10  Today's Date: 01/02/2021 PT Individual Time: 1300-1403 PT Individual Time Calculation (min): 63 min   Short Term Goals: Week 1:  PT Short Term Goal 1 (Week 1): Patient will complete bed <> wc transfers with LRAD and CGA PT Short Term Goal 2 (Week 1): Patient will ambulate >108ft with LRAD and MinA PT Short Term Goal 3 (Week 1): Patient will initiate stair training  Skilled Therapeutic Interventions/Progress Updates:    Patient received sitting up in wc, agreeable to PT. He denies pain and nausea. Wife at bedside. Patient and wife with multiple questions regarding medication management of BP to allow weaning from abdominal binder and TED hose, but maintaining appropriate BP to prevent "vertigo" episodes from occurring. PT deferring rx questions to MD/PA/RN. PT transporting patient in wc to therapy gym for time management. BP in sitting with abdominal binder + thigh high TED hose: 134/88 (102) 104BPM. Patient standing with RW and CGA. BP in standing: 120/87 (96) 107BPM. Patient with no reports of s/s of OH, but did state that he felt 664 systolic was "very low" for him. PT informing patient of OH parameters. He stood again with RW and CGA completed 1 min of standing marches. BP after this: 141/95 (110) 113BPM. Patient reports that this BP is "more like (his) usual." He was able to ambulate 56ft with RW and MinA. Ataxic gait noted with variable step- to/step-through, intermittent R knee "softening," but not truly buckling. BP afterward: 150/94 (112) 111BPM. Patient then ambulating 61ft with RW + MinA and wc follow for safety. 2# ankle weight added to L LE to improve proprioceptive input with some mild improvements noted. Patient responsive to cues to take "equal sized" steps B, but would sometimes take exaggerated step on L and then more typical sized step on the R. BP afterward: 159/95 (115) 107  BPM. Patient agreeable to ambulate additional 146ft with RW, MinA/ModA and wc follow for safety. Toward end of distance, patient with increasing ataxia and B "softening" of knees, but no buckling. Patient without reports of feeling lightheaded. BP afterward: 158/97 (114) 115BPM. PT transporting patient back to therapy gym where he negotiated 4 steps with B HR, self-selected reciprocal stepping + ModA. Patient seated in wc afterward and while seated was pulled to the R with centrifuge-like force (remained in the chair) and he reported feeling dizzy. ~8s later R torsional nystagmus was noted in B eyes, which lasted ~15s. BP in sitting: 140/102 (111) 107BPM. Patient reports that he feels as though this "episode" was due to "how long (he) was standing." Except, ambulating 139ft required a longer bout of standing than negotiating the 4 steps. Patient reported that he felt "normal" after sitting for additional minute. PT transporting patient back to his room in wc. Transferring to bed per his request. MinA stand pivot with noted L lateral lean when supported in chair and unsupported on edge of bed before laying down. Abdominal binder doffed edge of bed. Patient returning supine with supervision. Bed alarm on, call light within reach, wife at bedside.   Therapy Documentation Precautions:  Precautions Precautions: Fall,Other (comment) Precaution Comments: orthostatic hypotension which triggers "vertigo"--be extremely careful; thigh high TED hose, Abd binder Restrictions Weight Bearing Restrictions: No RLE Weight Bearing: Weight bearing as tolerated LLE Weight Bearing: Weight bearing as tolerated    Therapy/Group: Individual Therapy  Karoline Caldwell, PT, DPT, CBIS  01/02/2021, 7:34  AM  

## 2021-01-03 MED ORDER — MELATONIN 3 MG PO TABS
3.0000 mg | ORAL_TABLET | Freq: Every day | ORAL | Status: DC
  Administered 2021-01-03 – 2021-01-11 (×9): 3 mg via ORAL
  Administered 2021-01-12: 1.5 mg via ORAL
  Administered 2021-01-13: 3 mg via ORAL
  Filled 2021-01-03 (×11): qty 1

## 2021-01-03 MED ORDER — TRAMADOL HCL 50 MG PO TABS
50.0000 mg | ORAL_TABLET | Freq: Three times a day (TID) | ORAL | Status: DC | PRN
  Filled 2021-01-03: qty 1

## 2021-01-03 NOTE — IPOC Note (Signed)
Overall Plan of Care [facility]) Patient Details Name: Miguel [name] MRN: [id number] DOB: 1956-03-19  Admitting Diagnosis: Cerebrovascular accident (CVA) of right thalamus [facility])  Hospital Problems: Principal Problem:   Cerebrovascular accident (CVA) of right thalamus ([place]) Active Problems:   Cerebral infarction due to embolism of left cerebellar artery ([place])     Functional Problem List: Nursing Bowel,Safety,Endurance,Medication Management,Pain,Skin Integrity  PT Balance,Endurance,Motor,Nutrition,Perception,Safety,Sensory,Skin Integrity  OT Balance,Cognition,Motor,Safety  SLP Cognition,Perception  TR         Basic ADL's: OT Grooming,Bathing,Dressing,Toileting     Advanced  ADL's: OT Simple Meal Preparation     Transfers: PT Bed Mobility,Bed to Chair,Car,Furniture  OT Toilet,Tub/Shower     Locomotion: PT Ambulation,Stairs     Additional Impairments: OT Fuctional Use of Upper Extremity  SLP None      TR      Anticipated Outcomes Item Anticipated Outcome  Self Feeding Independent  Swallowing  N/A   Basic self-care  Supervision  Toileting  Supervision   Bathroom Transfers Supervision  Bowel/Bladder  Manage bowel with mod I assist  Transfers  ModI  Locomotion  supervision  Communication  N/A  Cognition  mod I basic problem solving/safety, supervision mild complex  Pain  Pain at or below level 4  Safety/Judgment  Maintain safety wtih cues/reminders   Therapy Plan: PT Intensity: Minimum of 1-2 x/day ,45 to 90 minutes PT Frequency: 5 out of 7 days PT Duration Estimated Length of Stay: 2-2.5 OT Intensity: Minimum of 1-2 x/day, 45 to 90 minutes OT Frequency: 5 out of 7 days OT Duration/Estimated Length of Stay: 12-14 days SLP Intensity: Minumum of 1-2 x/day, 30 to 90 minutes SLP Frequency: 3 to 5 out of 7 days SLP Duration/Estimated Length of Stay: 10-14 days   Due to the current state of emergency, patients may not be receiving their 3-hours of  Medicare-mandated therapy.   Team Interventions: Nursing Interventions Patient/Family Education,Pain Management,Medication Management,Discharge Planning,Bowel Management,Disease Management/Prevention,Skin Care/Wound Management  PT interventions Ambulation/gait training,Discharge planning,Functional mobility training,Psychosocial support,Therapeutic Activities,Visual/perceptual remediation/compensation,Balance/vestibular training,Disease management/prevention,Neuromuscular re-education,Skin care/wound management,Therapeutic Exercise,Wheelchair propulsion/positioning,Cognitive remediation/compensation,DME/adaptive equipment instruction,Pain management,Splinting/orthotics,UE/LE Strength taining/ROM,Community reintegration,Functional electrical stimulation,Patient/family education,Stair training,UE/LE Coordination activities  OT Interventions Balance/vestibular training,Cognitive remediation/compensation,Community reintegration,DME/adaptive equipment instruction,Discharge planning,Pain management,Self Care/advanced ADL retraining,Therapeutic Activities,UE/LE Coordination activities,Therapeutic Exercise,Patient/family education,Functional mobility training,Neuromuscular re-education,UE/LE Strength taining/ROM  SLP Interventions Cognitive remediation/compensation,Functional tasks,Patient/family education,Medication managment  TR Interventions    SW/CM Interventions Discharge Planning,Psychosocial Support,Patient/Family Education   Barriers to Discharge MD  Medical stability  Nursing Decreased caregiver support,Home environment access/layout 2 level 1/2 bath on main with spouse  PT Decreased caregiver support,Home environment access/layout significant orthostatic hypotension (?) that results in vertigo (?)  OT      SLP Decreased caregiver support wife works as an Therapist, sports, patient does have friends who are planning to stay with he and his wife  SW       Team Discharge Planning: Destination: PT-Home ,OT-  Home , SLP-  Projected Follow-up: PT-Outpatient PT,24 hour supervision/assistance, OT-  Outpatient OT, SLP-Other (comment) (TBD) Projected Equipment Needs: PT-To be determined, OT- To be determined, SLP-None recommended by SLP Equipment Details: PT- , OT-  Patient/family involved in discharge planning: PT- Patient,Family member/caregiver,  OT-Patient, SLP-Patient  MD ELOS: 7-10 days S to modI Medical Rehab Prognosis:  Excellent Assessment: Miguel [name] is a 65 year old man admitted to CIR with limb truncal ataxia/central vestibular disorder secondary to acute left cerebellar and right thalamus ischemic infarct. Active medical issues include hypertension, hypokalemia, and urinary retention. Labs and vitals are being monitored and medications are  being adjusted as needed.     See Team Conference Notes for weekly updates to the plan of care

## 2021-01-03 NOTE — Progress Notes (Signed)
PROGRESS NOTE   Subjective/Complaints: Appreciate nursing communication and education: patient states he only urinates once at home and has been refusing bladder scans/caths. Has voided approx once per day Vital signs stable  HYI:FOYDXA dizziness  Objective:   No results found. Recent Labs    12/31/20 1503  WBC 5.5  HGB 13.2  HCT 36.4*  PLT 268   Recent Labs    01/01/21 0509  NA 132*  K 4.8  CL 94*  CO2 30  GLUCOSE 163*  BUN 12  CREATININE 1.02  CALCIUM 9.7    Intake/Output Summary (Last 24 hours) at 01/03/2021 0848 Last data filed at 01/02/2021 2051 Gross per 24 hour  Intake 120 ml  Output --  Net 120 ml        Physical Exam: Vital Signs Blood pressure 115/87, pulse 95, temperature 98.3 F (36.8 C), temperature source Oral, resp. rate 16, weight 90.2 kg, SpO2 94 %. Gen: no distress, normal appearing HEENT: oral mucosa pink and moist, NCAT Cardio: Reg rate Chest: normal effort, normal rate of breathing Abd: soft, non-distended Ext: no edema Psych: pleasant, normal affect Skin: intact Neurologic: Cranial nerves II through XII intact, motor strength is 5/5 in bilateral deltoid, bicep, tricep, grip, hip flexor, knee extensors, ankle dorsiflexor and plantar flexor Sensory exam normal sensation to light touch and proprioception in bilateral upper and lower extremities Cerebellar exam normal finger to nose to finger as well as heel to shin in RIght  upper and lower extremities, mild/mod dysmetria left UE, normal heel shin LLE  Musculoskeletal: Full range of motion in all 4 extremities. No joint swelling   Assessment/Plan: 1. Functional deficits which require 3+ hours per day of interdisciplinary therapy in a comprehensive inpatient rehab setting.  Physiatrist is providing close team supervision and 24 hour management of active medical problems listed below.  Physiatrist and rehab team continue to assess  barriers to discharge/monitor patient progress toward functional and medical goals  Care Tool:  Bathing    Body parts bathed by patient: Right arm,Left arm,Chest,Abdomen,Front perineal area,Buttocks,Right upper leg,Left upper leg,Face,Right lower leg,Left lower leg         Bathing assist Assist Level: Minimal Assistance - Patient > 75%     Upper Body Dressing/Undressing Upper body dressing   What is the patient wearing?: Pull over shirt    Upper body assist Assist Level: Supervision/Verbal cueing    Lower Body Dressing/Undressing Lower body dressing      What is the patient wearing?: Pants     Lower body assist Assist for lower body dressing: Minimal Assistance - Patient > 75%     Toileting Toileting    Toileting assist Assist for toileting: Minimal Assistance - Patient > 75%     Transfers Chair/bed transfer  Transfers assist     Chair/bed transfer assist level: Minimal Assistance - Patient > 75%     Locomotion Ambulation   Ambulation assist   Ambulation activity did not occur: N/A  Assist level: 2 helpers Assistive device: Walker-rolling Max distance: 125   Walk 10 feet activity   Assist     Assist level: Minimal Assistance - Patient > 75% Assistive device: Walker-rolling  Walk 50 feet activity   Assist Walk 50 feet with 2 turns activity did not occur: Safety/medical concerns  Assist level: Moderate Assistance - Patient - 50 - 74% Assistive device: Walker-rolling    Walk 150 feet activity   Assist Walk 150 feet activity did not occur: Safety/medical concerns         Walk 10 feet on uneven surface  activity   Assist Walk 10 feet on uneven surfaces activity did not occur: Safety/medical concerns         Wheelchair     Assist Will patient use wheelchair at discharge?: No             Wheelchair 50 feet with 2 turns activity    Assist            Wheelchair 150 feet activity     Assist           Blood pressure 115/87, pulse 95, temperature 98.3 F (36.8 C), temperature source Oral, resp. rate 16, weight 90.2 kg, SpO2 94 %.  Medical Problem List and Plan: 1. Limb truncal ataxia/central vestibular disorder secondary to acute left cerebellar and right thalamus ischemic infarct -patient may shower -ELOS/Goals:7-10 days supervision to Mod I  -Continue CIR 2. Impaired mobility -DVT/anticoagulation: Continue Lovenox -antiplatelet therapy: Plavix 75 mg daily and Pletal 100 mg twice daily x3 weeks then Plavix alone 3. Pain Management: Decrease Tramadol to q8H prn 4. Depression: Continue Wellbutrin 150 mg daily, Klonopin 1 mg daily, Effexor 75 mg daily, Ativan as needed -antipsychotic agents: N/A 5. Neuropsych: This patient is capable of making decisions on his own behalf. 6. Skin/Wound Care: Routine skin checks 7. Fluids/Electrolytes/Nutrition: Routine in and outs with follow-up chemistries 8. Hypertension. Continue Avapro 75 mg daily, Norvasc 10 mg daily. Monitor with increased mobility     Vitals:   12/31/20 2010 01/01/21 0423  BP: 128/89 135/85  Pulse: 98 100  Resp: 18 17  Temp: 98.4 F (36.9 C) 99 F (37.2 C)  SpO2: 97% 97%  check orthostatic vitals 9. Hyperlipidemia. Continue Lipitor 10. Cervical dystonia. Patient has been followed in the past by Dr. Evelena Leyden and now Dr Gaye Alken in [facility] and has received Xeomin in the past 11. History of tobacco alcohol use. Alcohol negative on admission. Provide counseling 12. Eczema- might benefit from steroid creams  13.  HypoK mild add KCL 67meq po qd recheck 5/2 14. Urinary rentention: educated regarding importance of staying well hydrated/voiding regularly throughout the day- it appears to be normal for him at home to void once per day. He has been drinking a lot of fluid.   15. Insomnia: start melatonin 3mg  HS     LOS: 3 days A  FACE TO FACE EVALUATION WAS PERFORMED  Clide Deutscher Marrio Scribner 01/03/2021, 8:48 AM

## 2021-01-03 NOTE — Progress Notes (Signed)
Patient last urinated around 9 am. Patient states " I only pee once a day, I do not feel like I have to go". Educated the patient on the importance of emptying his bladder(risk and benefits of emptying and and not emptying his bladder). Patient refuses to be bladder scanned/ pt refuses an in & out cath for no void- per orders. Patient states he will try and was able to urinate but states that it is his norm to only urinate once a day. Will pass on to oncoming day shift nurse to make day shift provider aware.

## 2021-01-04 LAB — BASIC METABOLIC PANEL
Anion gap: 9 (ref 5–15)
BUN: 9 mg/dL (ref 8–23)
CO2: 28 mmol/L (ref 22–32)
Calcium: 9.5 mg/dL (ref 8.9–10.3)
Chloride: 95 mmol/L — ABNORMAL LOW (ref 98–111)
Creatinine, Ser: 0.9 mg/dL (ref 0.61–1.24)
GFR, Estimated: >60 mL/min (ref >60)
Glucose, Bld: 172 mg/dL — ABNORMAL HIGH (ref 70–99)
Potassium: 3.7 mmol/L (ref 3.5–5.1)
Sodium: 132 mmol/L — ABNORMAL LOW (ref 135–145)

## 2021-01-04 MED ORDER — BACLOFEN 10 MG PO TABS
10.0000 mg | ORAL_TABLET | Freq: Three times a day (TID) | ORAL | Status: DC
  Administered 2021-01-04 – 2021-01-12 (×24): 10 mg via ORAL
  Filled 2021-01-04 (×26): qty 1

## 2021-01-04 NOTE — Progress Notes (Signed)
Physical Therapy Session Note  Patient Details  Name: Miguel [name] MRN: [id number] Date of Birth: 05-01-1956  Today's Date: 01/04/2021 PT Individual Time: 9798-9211 PT Individual Time Calculation (min): 42 min   Short Term Goals: Week 1:  PT Short Term Goal 1 (Week 1): Patient will complete bed <> wc transfers with LRAD and CGA PT Short Term Goal 2 (Week 1): Patient will ambulate >35ft with LRAD and MinA PT Short Term Goal 3 (Week 1): Patient will initiate stair training  Skilled Therapeutic Interventions/Progress Updates:    Patient received supine in bed, agreeable to PT. He denies pain, but endorses fatigue from "busy morning." He was able to come sit edge of bed with supervision and don abdominal binder + shoes with supervision and verbal cues for safety. BP in sitting: 216/82 (94) 103 BPM. Patient able to ambulate to therapy gym with RW and MinA/ModA + wc follow for safety. L LE ataxia noted with inconsistent step length B + L visual inattention and poor awareness of how to correct for running into obstacles. BP after ambulating ~142ft: 150/95 (109) 108BPM. Patient ambulating 110ft with RW, MinA + wc follow and 4# ankle weight to L LE. Minimized ataxia appreciated, except when taking side steps in order to complete very rounded turn. Patient with generally poor safety awareness and awareness of LOB. BP upon sitting: 133/85 (99) 112BPM. Patient reporting that he would like to "live with (his) blood pressure around 130s." Patient ambulating additional 225ft with RW, MinA + wc follow and 4# ankle weight to L LE. Pivot turn completed during gait bout with less of a LOB noted. At end of gait bout, patients weight began pulling more to the R, which previously has been indicative of one of patients "vertigo episodes," however patient denied dizziness. BP 150/92 (106) 118BPM. Patient agreeable to ambulate back to his room with RW, ModA + wc follow, no ankle weight. Patient narrowly missing L wall at times  and walking into L doorframe of his room. Patient becoming frustrated that he feels as though his walker is "pulling" him that way. PT attempting to cue patient to scan L to observe any obstacles he needed to avoid, however, patient did not scan L and thus walked into doorframe. BP upon sitting 143/89 (101) 118BPM. Patient returning supine, bed alarm on, call light within reach.   Therapy Documentation Precautions:  Precautions Precautions: Fall,Other (comment) Precaution Comments: orthostatic hypotension which triggers "vertigo"--be extremely careful; thigh high TED hose, Abd binder Restrictions Weight Bearing Restrictions: No RLE Weight Bearing: Weight bearing as tolerated LLE Weight Bearing: Weight bearing as tolerated    Therapy/Group: Individual Therapy  Karoline Caldwell, PT, DPT, CBIS  01/04/2021, 7:54 AM

## 2021-01-04 NOTE — Progress Notes (Addendum)
PROGRESS NOTE   Subjective/Complaints: Constant hiccups interfered with sleep , no issues overnight   CXK:GYJEHU dizziness  Objective:   No results found. No results for input(s): WBC, HGB, HCT, PLT in the last 72 hours. Recent Labs    01/04/21 0652  NA 132*  K 3.7  CL 95*  CO2 28  GLUCOSE 172*  BUN 9  CREATININE 0.90  CALCIUM 9.5    Intake/Output Summary (Last 24 hours) at 01/04/2021 0805 Last data filed at 01/04/2021 0448 Gross per 24 hour  Intake 620 ml  Output 700 ml  Net -80 ml        Physical Exam: Vital Signs Blood pressure (!) 151/90, pulse 100, temperature 98 F (36.7 C), temperature source Oral, resp. rate 18, weight 90.2 kg, SpO2 96 %. Frequent eructation General: No acute distress Mood and affect are appropriate Heart: Regular rate and rhythm no rubs murmurs or extra sounds Lungs: Clear to auscultation, breathing unlabored, no rales or wheezes Abdomen: Positive bowel sounds, soft nontender to palpation, nondistended Extremities: No clubbing, cyanosis, or edema Skin: No evidence of breakdown, no evidence of rash  Neurologic: Cranial nerves II through XII intact, motor strength is 5/5 in bilateral deltoid, bicep, tricep, grip, hip flexor, knee extensors, ankle dorsiflexor and plantar flexor Sensory exam normal sensation to light touch and proprioception in bilateral upper and lower extremities Cerebellar exam normal finger to nose to finger as well as heel to shin in RIght  upper and lower extremities, mild/mod dysmetria left UE, normal heel shin LLE  Musculoskeletal: Full range of motion in all 4 extremities. No joint swelling   Assessment/Plan: 1. Functional deficits which require 3+ hours per day of interdisciplinary therapy in a comprehensive inpatient rehab setting.  Physiatrist is providing close team supervision and 24 hour management of active medical problems listed below.  Physiatrist  and rehab team continue to assess barriers to discharge/monitor patient progress toward functional and medical goals  Care Tool:  Bathing    Body parts bathed by patient: Right arm,Left arm,Chest,Abdomen,Front perineal area,Buttocks,Right upper leg,Left upper leg,Face,Right lower leg,Left lower leg         Bathing assist Assist Level: Minimal Assistance - Patient > 75%     Upper Body Dressing/Undressing Upper body dressing   What is the patient wearing?: Pull over shirt    Upper body assist Assist Level: Supervision/Verbal cueing    Lower Body Dressing/Undressing Lower body dressing      What is the patient wearing?: Pants     Lower body assist Assist for lower body dressing: Minimal Assistance - Patient > 75%     Toileting Toileting    Toileting assist Assist for toileting: Minimal Assistance - Patient > 75%     Transfers Chair/bed transfer  Transfers assist     Chair/bed transfer assist level: Minimal Assistance - Patient > 75%     Locomotion Ambulation   Ambulation assist   Ambulation activity did not occur: N/A  Assist level: 2 helpers Assistive device: Walker-rolling Max distance: 125   Walk 10 feet activity   Assist     Assist level: Minimal Assistance - Patient > 75% Assistive device: [employer]  50 feet activity   Assist Walk 50 feet with 2 turns activity did not occur: Safety/medical concerns  Assist level: Moderate Assistance - Patient - 50 - 74% Assistive device: Walker-rolling    Walk 150 feet activity   Assist Walk 150 feet activity did not occur: Safety/medical concerns         Walk 10 feet on uneven surface  activity   Assist Walk 10 feet on uneven surfaces activity did not occur: Safety/medical concerns         Wheelchair     Assist Will patient use wheelchair at discharge?: No             Wheelchair 50 feet with 2 turns activity    Assist            Wheelchair 150 feet  activity     Assist          Blood pressure (!) 151/90, pulse 100, temperature 98 F (36.7 C), temperature source Oral, resp. rate 18, weight 90.2 kg, SpO2 96 %.  Medical Problem List and Plan: 1. Limb truncal ataxia/central vestibular disorder secondary to acute left cerebellar and right thalamus ischemic infarct -patient may shower -ELOS/Goals:7-10 days supervision to Mod I  -Continue CIR 2. Impaired mobility -DVT/anticoagulation: Continue Lovenox -antiplatelet therapy: Plavix 75 mg daily and Pletal 100 mg twice daily x3 weeks then Plavix alone 3. Pain Management: Decrease Tramadol to q8H prn 4. Depression: Continue Wellbutrin 150 mg daily, Klonopin 1 mg daily, Effexor 75 mg daily, Ativan as needed -antipsychotic agents: N/A 5. Neuropsych: This patient is capable of making decisions on his own behalf. 6. Skin/Wound Care: Routine skin checks 7. Fluids/Electrolytes/Nutrition: Routine in and outs with follow-up chemistries 8. Hypertension. Continue Avapro 75 mg daily, Norvasc 10 mg daily. Monitor with increased mobility     Vitals:   12/31/20 2010 01/01/21 0423  BP: 128/89 135/85  Pulse: 98 100  Resp: 18 17  Temp: 98.4 F (36.9 C) 99 F (37.2 C)  SpO2: 97% 97%  check orthostatic vitals 9. Hyperlipidemia. Continue Lipitor 10. Cervical dystonia. Patient has been followed in the past by Dr. Evelena Leyden and now Dr Gaye Alken in [facility] and has received Xeomin in the past 11. History of tobacco alcohol use. Alcohol negative on admission. Provide counseling 12. Eczema- might benefit from steroid creams  13.  HypoK mild add KCL 17meq po qd recheck 5/2 14. Urinary rentention: educated regarding importance of staying well hydrated/voiding regularly throughout the day- it appears to be normal for him at home to void once per day. He has been drinking a lot of fluid.   15. Insomnia: start  melatonin 3mg  HS   16.  Hiccups - intractible , r/t cerebellar CVA, trial baclofen , nl GFR  LOS: 4 days A FACE TO FACE EVALUATION WAS PERFORMED  Charlett Blake 01/04/2021, 8:05 AM

## 2021-01-04 NOTE — Progress Notes (Signed)
Occupational Therapy Session Note  Patient Details  Name: Miguel [name] MRN: [id number] Date of Birth: [birth year]  Today's Date: 01/04/2021 OT Individual Time: 3762-8315 OT Individual Time Calculation (min): 71 min    Short Term Goals: Week 1:  OT Short Term Goal 1 (Week 1): Pt will complete LB bathing sit to stand with supervision for 3 consecutive sessions. OT Short Term Goal 2 (Week 1): Pt will complete LB dressing sit to stand with supervision for 3 conseutive sessions. OT Short Term Goal 3 (Week 1): Pt will complete toilet transfers with supervision using RW and 3:1. OT Short Term Goal 4 (Week 1): Pt will use the LUE at a non-dominant level with supervision during selfcare tasks. OT Short Term Goal 5 (Week 1): Pt will report dizziness of less than 3/10 with transitional movements.   Skilled Therapeutic Interventions/Progress Updates:    Pt greeted at time of session semireclined in bed resting agreeable to OT session, needing to use the bathroom first. Supine > sit Supervision and donned binder with Min A to wrap around, declined TEDS saying he does not use them. Ambulated CGA to bathroom and toilet transfer same manner. Pt adamant to have privacy during toileting, educated for safety have to supervise despite irritation. Ambulated toilet > sink with 2 LOB with Mod A to recover, washed hands CGA standing before walking room <> gym CGA with L side neglect and LLE ataxia present, close wheelchair follow. In gym, seated rebounder with 2kg and lightweight ball 3x20 reps, BUE strengthening with 5# dowel with mirror for feedback for symmetry, dynamic standing activity with [place] retraining with BUE support on AD for remembering sequence of colored pads and accurate placement of LLE. Focus of session on pt ed as well for CVA recovery and DC planning. Walked back to room same manner as above with close wheelchair follow, in bed reclined alarm on call bell in reach.   Therapy Documentation Precautions:   Precautions Precautions: Fall,Other (comment) Precaution Comments: orthostatic hypotension which triggers "vertigo"--be extremely careful; thigh high TED hose, Abd binder Restrictions Weight Bearing Restrictions: No RLE Weight Bearing: Weight bearing as tolerated LLE Weight Bearing: Weight bearing as tolerated     Therapy/Group: Individual Therapy  Viona Gilmore 01/04/2021, 7:21 AM

## 2021-01-04 NOTE — Progress Notes (Signed)
Occupational Therapy Session Note  Patient Details  Name: Miguel [name] MRN: [id number] Date of Birth: [birth year]  Today's Date: 01/04/2021 OT Individual Time: 0177-9390 OT Individual Time Calculation (min): 27 min   Short Term Goals: Week 1:  OT Short Term Goal 1 (Week 1): Pt will complete LB bathing sit to stand with supervision for 3 consecutive sessions. OT Short Term Goal 2 (Week 1): Pt will complete LB dressing sit to stand with supervision for 3 conseutive sessions. OT Short Term Goal 3 (Week 1): Pt will complete toilet transfers with supervision using RW and 3:1. OT Short Term Goal 4 (Week 1): Pt will use the LUE at a non-dominant level with supervision during selfcare tasks. OT Short Term Goal 5 (Week 1): Pt will report dizziness of less than 3/10 with transitional movements.  Skilled Therapeutic Interventions/Progress Updates:    Pt greeted in bed with no c/o pain. He was agreeable to session, wanted to try to just use the abdominal binder vs abdominal binder with thigh high Teds to see how he'd feel. BP at start of session sans binder 137/83. Pt with a little dizziness but not bad. Supine<sit from flat bed without bedrail per home setup completed unassisted. Pt then donned his binder with Mod A. Since ADL needs were met, guided pt through UB exercises using the orange tband x15 reps 2 sets each exercise. Vcs for improving midline when sitting unsupported due to Lt lean. Pt able to use a feature on the wall to visually rectify posture. BP after 1 set of exercises 157/85 and then 131/93 after 2nd set. Pt with mild-moderate dizziness/lightheadedness. OT then doffed abdominal binder and he returned to flat bed without bedrails without assistance. Provided him with a green tband to increase therapeutic challenge later this week, pt stated the orange tband felt a little easy. All needs within reach and bed alarm set prior to OT departure.   Therapy Documentation Precautions:   Precautions Precautions: Fall,Other (comment) Precaution Comments: orthostatic hypotension which triggers "vertigo"--be extremely careful; thigh high TED hose, Abd binder Restrictions Weight Bearing Restrictions: No RLE Weight Bearing: Weight bearing as tolerated LLE Weight Bearing: Weight bearing as tolerated ADL: ADL Eating: Independent Where Assessed-Eating: Bed level Grooming: Setup Where Assessed-Grooming: Edge of bed Upper Body Bathing: Supervision/safety Where Assessed-Upper Body Bathing: Edge of bed Lower Body Bathing: Minimal assistance Where Assessed-Lower Body Bathing: Edge of bed Upper Body Dressing: Setup Where Assessed-Upper Body Dressing: Edge of bed Lower Body Dressing: Minimal assistance Where Assessed-Lower Body Dressing: Edge of bed Toileting: Minimal assistance Where Assessed-Toileting: Bedside Commode Toilet Transfer: Minimal assistance Toilet Transfer Method: Stand pivot Science writer: Engineer, technical sales Transfer: Not assessed Social research officer, government: Not assessed     Therapy/Group: Individual Therapy  Miguel [name] 01/04/2021, 12:40 PM

## 2021-01-04 NOTE — Progress Notes (Signed)
Speech Language Pathology Daily Session Note  Patient Details  Name: Miguel [name] MRN: [id number] Date of Birth: 1955-12-19  Today's Date: 01/04/2021 SLP Individual Time: 2778-2423 SLP Individual Time Calculation (min): 45 min  Short Term Goals: Week 1: SLP Short Term Goal 1 (Week 1): Patient will demonstrate awareness to errors during functional mild complex problem solving tasks with minA cues SLP Short Term Goal 2 (Week 1): Patient will demonstrate recall of specific therapy tasks and medical interventions completed during the day with minA cues. SLP Short Term Goal 3 (Week 1): Patient will perform medication management tasks with minA cues for accuracy. SLP Short Term Goal 4 (Week 1): Patient will answer delayed recall questions based on paragraph level text, with 80% accuracy and minA cues.  Skilled Therapeutic Interventions: Skilled SLP intervention focused on cognition. Pt completed mildly complex problem solving task with functional written information. He completed time calculations with min A verbal cues. He required written cue x1 for awareness of simple calculation error. He completed calendar organization task with min A verbal cues. Pt requires assistance with reading small print due to not having any contact lenses and not having glasses here at hospital. Pt reports no one will be bringing these to help with vision because they are not an updated prescription. Cont with therapy per plan of care.      Pain Pain Assessment Pain Scale: Faces Faces Pain Scale: No hurt  Therapy/Group: Individual Therapy  Miguel [name] 01/04/2021, 3:25 PM

## 2021-01-05 NOTE — Progress Notes (Addendum)
PROGRESS NOTE   Subjective/Complaints: No more hiccups since starting baclofen  Discussed BP management , permissive HTN, orthostatic hypotension , an dgradual correction  CZY:SAYTKZ dizziness  Objective:   No results found. No results for input(s): WBC, HGB, HCT, PLT in the last 72 hours. Recent Labs    01/04/21 0652  NA 132*  K 3.7  CL 95*  CO2 28  GLUCOSE 172*  BUN 9  CREATININE 0.90  CALCIUM 9.5    Intake/Output Summary (Last 24 hours) at 01/05/2021 0804 Last data filed at 01/05/2021 0732 Gross per 24 hour  Intake 582 ml  Output --  Net 582 ml        Physical Exam: Vital Signs Blood pressure (!) 149/78, pulse 89, temperature 98.5 F (36.9 C), resp. rate 18, weight 90.2 kg, SpO2 95 %. No eructation  General: No acute distress Mood and affect are appropriate Heart: Regular rate and rhythm no rubs murmurs or extra sounds Lungs: Clear to auscultation, breathing unlabored, no rales or wheezes Abdomen: Positive bowel sounds, soft nontender to palpation, nondistended Extremities: No clubbing, cyanosis, or edema Skin: No evidence of breakdown, no evidence of rash   Neurologic: Cranial nerves II through XII intact, motor strength is 5/5 in bilateral deltoid, bicep, tricep, grip, hip flexor, knee extensors, ankle dorsiflexor and plantar flexor Sensory exam normal sensation to light touch and proprioception in bilateral upper and lower extremities Cerebellar exam normal finger to nose to finger as well as heel to shin in RIght  upper and lower extremities, mild/mod dysmetria left UE, normal heel shin LLE  Musculoskeletal: Full range of motion in all 4 extremities. No joint swelling   Assessment/Plan: 1. Functional deficits which require 3+ hours per day of interdisciplinary therapy in a comprehensive inpatient rehab setting.  Physiatrist is providing close team supervision and 24 hour management of active medical  problems listed below.  Physiatrist and rehab team continue to assess barriers to discharge/monitor patient progress toward functional and medical goals  Care Tool:  Bathing    Body parts bathed by patient: Right arm,Left arm,Chest,Abdomen,Front perineal area,Buttocks,Right upper leg,Left upper leg,Face,Right lower leg,Left lower leg         Bathing assist Assist Level: Minimal Assistance - Patient > 75%     Upper Body Dressing/Undressing Upper body dressing   What is the patient wearing?: Pull over shirt    Upper body assist Assist Level: Supervision/Verbal cueing    Lower Body Dressing/Undressing Lower body dressing      What is the patient wearing?: Pants     Lower body assist Assist for lower body dressing: Minimal Assistance - Patient > 75%     Toileting Toileting    Toileting assist Assist for toileting: Contact Guard/Touching assist     Transfers Chair/bed transfer  Transfers assist     Chair/bed transfer assist level: Minimal Assistance - Patient > 75%     Locomotion Ambulation   Ambulation assist   Ambulation activity did not occur: Safety/medical concerns  Assist level: 2 helpers Assistive device: Walker-rolling Max distance: 200   Walk 10 feet activity   Assist     Assist level: Minimal Assistance - Patient > 75% Assistive  device: Walker-rolling   Walk 50 feet activity   Assist Walk 50 feet with 2 turns activity did not occur: Safety/medical concerns  Assist level: 2 helpers Assistive device: Walker-rolling    Walk 150 feet activity   Assist Walk 150 feet activity did not occur: Safety/medical concerns  Assist level: 2 helpers Assistive device: Walker-rolling    Walk 10 feet on uneven surface  activity   Assist Walk 10 feet on uneven surfaces activity did not occur: Safety/medical concerns         Wheelchair     Assist Will patient use wheelchair at discharge?: No             Wheelchair 50 feet  with 2 turns activity    Assist            Wheelchair 150 feet activity     Assist          Blood pressure (!) 149/78, pulse 89, temperature 98.5 F (36.9 C), resp. rate 18, weight 90.2 kg, SpO2 95 %.  Medical Problem List and Plan: 1. 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 Impaired mobility -DVT/anticoagulation: Continue Lovenox -antiplatelet therapy: Plavix 75 mg daily and Pletal 100 mg twice daily x3 weeks then Plavix alone 3. Pain Management: Decrease Tramadol to q8H prn 4. Depression: Continue Wellbutrin 150 mg daily, Klonopin 1 mg daily, Effexor 75 mg daily, Ativan as needed -antipsychotic agents: N/A 5. Neuropsych: This patient is capable of making decisions on his own behalf. 6. Skin/Wound Care: Routine skin checks 7. Fluids/Electrolytes/Nutrition: Routine in and outs with follow-up chemistries 8. Hypertension. Continue Avapro 75 mg daily, Norvasc 10 mg daily. Monitor with increased mobility     Vitals:   12/31/20 2010 01/01/21 0423  BP: 128/89 135/85  Pulse: 98 100  Resp: 18 17  Temp: 98.4 F (36.9 C) 99 F (37.2 C)  SpO2: 97% 97%  check orthostatic vitals- majority of readings are normotensive will cont to monitor  9. Hyperlipidemia. Continue Lipitor 10. Cervical dystonia. Patient has been followed in the past by Dr. Evelena Leyden and now Dr Gaye Alken in [facility] and has received Xeomin in the past Don't see much evidence of abnormal head positioning  at this time  11. History of tobacco alcohol use. Alcohol negative on admission. Provide counseling 12. Eczema- might benefit from steroid creams  13.  HypoK mild add KCL 4meq po qd recheck 5/2 14. Urinary rentention: educated  regarding importance of staying well hydrated/voiding regularly throughout the day- it appears to be normal for him at home to void once per day. He has been drinking a lot of fluid.   15. Insomnia: start melatonin 3mg  HS   16.  Hiccups - intractible , r/t cerebellar CVA, trial baclofen , nl GFR  LOS: 5 days A FACE TO FACE EVALUATION WAS PERFORMED  Charlett Blake 01/05/2021, 8:04 AM

## 2021-01-05 NOTE — Progress Notes (Signed)
Physical Therapy Session Note  Patient Details  Name: Miguel [name] MRN: [id number] Date of Birth: [birth year]  Today's Date: 01/05/2021 PT Individual Time: 2979-8921 PT Individual Time Calculation (min): 66 min   Short Term Goals: Week 1:  PT Short Term Goal 1 (Week 1): Patient will complete bed <> wc transfers with LRAD and CGA PT Short Term Goal 2 (Week 1): Patient will ambulate >89ft with LRAD and MinA PT Short Term Goal 3 (Week 1): Patient will initiate stair training  Skilled Therapeutic Interventions/Progress Updates:    Pt received asleep, R sidelying in bed and upon awakening agreeable to therapy session. Assessed supine vitals: BP 135/87 (MAP 95), HR 90bpm  Supine>sitting R EOB, HOB slightly elevated, with supervision for safety. Sitting EOB donned abdominal binder for BP management - pt refusing to wear TED hose at this time. Donned shoes set-up assist - pt demos some incoordination in hands but able to tie shoes without assist. L squat pivot EOB>w/c with CGA/min assist.  Transported to/from gym in w/c for time management and energy conservation. L squat pivot w/c>EOM with CGA. Sitting on mat donned maxi-sky harness.   Performed the following dynamic balance and dynamic gait challenges:  - forward walking with B HHA: demos B LE ataxia/incoordination with varying step lengths, step width, and part of foot that contacts the ground on initial contact; demos increased postural sway and when having minor LOB has incoordinated attempt at stepping strategy having to take several corrective steps to get feet in proper position and regain balance - backwards walking: demos same as with forward walking but with more pronounced incoordination - R/L side stepping: demos increased challenge with stepping towards L as pt has constant L lean with poor R weight shift in order to step L foot sideways - forward foot taps to external targets on 4" step with bias of L foot taps in order to force R weight  shift - focused on placement of feet when bringing them back under his COM after tapping step so that he doesn't have to reposition his feet if needing to step the contralateral foot during the next tap - B HHA progressed to only RHHA - forward walking with only RHHA (progresssed from [facility]) with pt demoing inconsistent but improving B LE coordination  Therapist providing cuing for improved foot placement and gait mechanics throughout. Assessed vitals after 1st round of forwards and backwards walking: BP 116/81 (MAP 89), HR 111bpm Reassessed after foot tapping balance task as pt noted to be "clammy" BP 132/93 (MAP 106), HR 112bpm  Pt wearing abdominal binder throughout. 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 Also, has 1 episode of feeling headache pain in "location of the stroke" that dissipates with seated rest break.  After dynamic balance tasks pt suddenly becomes nauseous and has dry heaving with 1x very, small amount of emesis - therapist provided trash can and warm washcloth per pt request. Once symptoms dissipated pt performed R stand pivot EOM>w/c, no AD, with min assist for balance. Returned to room in w/c. R stand pivot w/c>EOB as above. Doffed shoes without assist. Sit>supine supervision. Pt left supine in bed with needs in reach and bed alarm on. RN made aware of pt's nausea during therapy.   Therapy Documentation Precautions:  Precautions Precautions: Fall,Other (comment) Precaution Comments: orthostatic hypotension which triggers "vertigo"--be extremely careful; thigh high TED hose, Abd binder Restrictions Weight Bearing Restrictions: No RLE Weight Bearing:  Weight bearing as tolerated LLE Weight Bearing: Weight bearing as tolerated  Pain:   Reports of neck pain - details above.   Therapy/Group: Individual Therapy  Tawana Scale , PT, DPT, CSRS  01/05/2021, 3:31 PM

## 2021-01-05 NOTE — Progress Notes (Signed)
Speech Language Pathology Daily Session Note  Patient Details  Name: Miguel [name] MRN: [id number] Date of Birth: July 22, 1956  Today's Date: 01/05/2021 SLP Individual Time: 5993-5701 SLP Individual Time Calculation (min): 45 min  Short Term Goals: Week 1: SLP Short Term Goal 1 (Week 1): Patient will demonstrate awareness to errors during functional mild complex problem solving tasks with minA cues SLP Short Term Goal 2 (Week 1): Patient will demonstrate recall of specific therapy tasks and medical interventions completed during the day with minA cues. SLP Short Term Goal 3 (Week 1): Patient will perform medication management tasks with minA cues for accuracy. SLP Short Term Goal 4 (Week 1): Patient will answer delayed recall questions based on paragraph level text, with 80% accuracy and minA cues.  Skilled Therapeutic Interventions: Pt seen for skilled ST with focus on cognitive goals. Pt completing moderately complex problem solving task focused on functional information with min A fading to supervision A verbal cues. Pt demonstrating emerging awareness of errors, completing safety task with Supervision level cues with 100% accuracy. Pt independently aware of physical deficits this date, does need encouragement to ID mild cognitive deficits/impact. SLP and pt reviewing med list in comparison with meds presented by nursing during med pass. Pt would benefit from ongoing education however wife is RN and will be responsible for med management at d/c. Pt left in bed with alarm set and all needs within reach .Cont ST POC.   Pain Pain Assessment Pain Scale: 0-10 Pain Score: 0-No pain  Therapy/Group: Individual Therapy  Dewaine Conger 01/05/2021, 9:13 AM

## 2021-01-05 NOTE — Progress Notes (Signed)
Physical Therapy Session Note  Patient Details  Name: Miguel [name] MRN: [id number] Date of Birth: 05/22/1956  Today's Date: 01/05/2021 PT Individual Time: 1400-1430 PT Individual Time Calculation (min): 30 min   Short Term Goals: Week 1:  PT Short Term Goal 1 (Week 1): Patient will complete bed <> wc transfers with LRAD and CGA PT Short Term Goal 2 (Week 1): Patient will ambulate >7ft with LRAD and MinA PT Short Term Goal 3 (Week 1): Patient will initiate stair training  Skilled Therapeutic Interventions/Progress Updates:    Patient received sitting up in recliner, wife at bedside. Patient voicing strong frustrations regarding not being able to ambulate to the bathroom with his wife or shower with her assistance. PT reiterating that wife would have to be checked off by therapy in order to safely do so. Patient and wife making PT aware that she knows how to turn alarms off and has been assisting patient in shower and transferring him to toilet without therapy approval. Patient adamant that he will not participate in further therapy until he receives the okay to allow wife to assist with these transfers. PT emphasizing that given patients current level of assist (up to [employer] for gait using RW) and tendency toward orthostatic hypotension that results in "dizzy spells" that cause patient to be a significant fall risk, it may be some time before he is officially approved to allow wife to assist. Patient continued to voice frustration at this. Wife able to demonstrate "dry run" ambulating to bathroom with patient using RW, gait belt and abdominal binder. Wife requiring cues to hold onto gait belt. Patient presenting with very ataxic gait with R LE crossing midline significantly requiring intervention from PT to assist wife in order for patient to regain balance. Patient ambulating remainder of distance to toilet with wife assist and supervision from PT. He returned to edge of bed with assist from wife and  verbal cues from PT to aid in minimizing ataxia noted in B LE (L>R). Patient with poor insight into his deficits making it difficult for PT to fully educate and inform wife on how to safely assist patient without him becoming agitated. Due to patients fluctuating BP and level of assist/extent of his LE ataxia, patient and wife would benefit from further observation and education from PT before receiving final "okay" to allow wife to assist patient in room. RN aware of this. Safety plan not updated at this time as patient still requires assist from hospital staff as noted on safety plan. Patient remaining edge of bed with wife present, bed alarm on, call light within reach.   Therapy Documentation Precautions:  Precautions Precautions: Fall,Other (comment) Precaution Comments: orthostatic hypotension which triggers "vertigo"--be extremely careful; thigh high TED hose, Abd binder Restrictions Weight Bearing Restrictions: No RLE Weight Bearing: Weight bearing as tolerated LLE Weight Bearing: Weight bearing as tolerated    Therapy/Group: Individual Therapy  Karoline Caldwell, PT, DPT, CBIS  01/05/2021, 7:38 AM

## 2021-01-05 NOTE — Progress Notes (Addendum)
Occupational Therapy Session Note  Patient Details  Name: Miguel [name] MRN: [id number] Date of Birth: 08/28/1956  Today's Date: 01/05/2021 OT Individual Time: 1107-1200 OT Individual Time Calculation (min): 53 min    Short Term Goals: Week 1:  OT Short Term Goal 1 (Week 1): Pt will complete LB bathing sit to stand with supervision for 3 consecutive sessions. OT Short Term Goal 2 (Week 1): Pt will complete LB dressing sit to stand with supervision for 3 conseutive sessions. OT Short Term Goal 3 (Week 1): Pt will complete toilet transfers with supervision using RW and 3:1. OT Short Term Goal 4 (Week 1): Pt will use the LUE at a non-dominant level with supervision during selfcare tasks. OT Short Term Goal 5 (Week 1): Pt will report dizziness of less than 3/10 with transitional movements.  Skilled Therapeutic Interventions/Progress Updates:    Pt received semi-reclined in bed, denies pain, agreeable to therapy. Session focus on LUE/LLE NMR, dynamic standing, balance, and activity tolerance in prep for improved ADL/func mobility performance. Came to sitting EOB and donned abdominal binder close S.  BP read at 136/94. STS with CGA throughout session with RW. Amb to and from bathroom with RW to compete toilet transfer with overall min A. Small LOB towards L - min A to correct. Close S to CGA for for LB clothing management and pericare. Cont void of bowel. Seated washed hands with set up. Pt self-propelled self to and from gym with close S, good navigation of hallway obstacles. Stood for 2 rounds at [employer], pt verbalized fear of falling, agreeable to try game with encouragement. Able to complete single target game using LUE + RW for 3 min with overall CGA. LLE noted to be shaking but not LOB or c/o dizziness. Additionally, completed Trail Making Part B in 3 min 21 seconds with no LOB. Finally, completed 1x15 modified sit-ups + shoulder press with 2 kg medicine ball, pt declined attempting to practice additional  STS. Stand-pivot to recliner with  Min A for balance.   Pt left in recliner with chair alarm engaged, call bell in reach, and all immediate needs met.    Therapy Documentation Precautions:  Precautions Precautions: Fall,Other (comment) Precaution Comments: orthostatic hypotension which triggers "vertigo"--be extremely careful; thigh high TED hose, Abd binder Restrictions Weight Bearing Restrictions: No RLE Weight Bearing: Weight bearing as tolerated LLE Weight Bearing: Weight bearing as tolerated Pain: Pain Assessment Pain Scale: 0-10 Pain Score: 0-No pain ADL: See Care Tool for more details.  Therapy/Group: Individual Therapy  Volanda Napoleon MS, OTR/L  01/05/2021, 12:10 PM

## 2021-01-06 NOTE — Progress Notes (Signed)
Occupational Therapy Session Note  Patient Details  Name: Miguel [name] MRN: [id number] Date of Birth: 04/17/1956  Today's Date: 01/06/2021 OT Individual Time: 7829-5621 OT Individual Time Calculation (min): 57 min    Short Term Goals: Week 1:  OT Short Term Goal 1 (Week 1): Pt will complete LB bathing sit to stand with supervision for 3 consecutive sessions. OT Short Term Goal 2 (Week 1): Pt will complete LB dressing sit to stand with supervision for 3 conseutive sessions. OT Short Term Goal 3 (Week 1): Pt will complete toilet transfers with supervision using RW and 3:1. OT Short Term Goal 4 (Week 1): Pt will use the LUE at a non-dominant level with supervision during selfcare tasks. OT Short Term Goal 5 (Week 1): Pt will report dizziness of less than 3/10 with transitional movements.  Skilled Therapeutic Interventions/Progress Updates:    Pt in recliner to start session with BP in sitting at 125/81.  He reports some nausea as well but very little dizziness.  BP taken in standing with abdominal binder in place at 142/89.  No increase in dizziness with standing.  He then completed functional mobility down to the therapy gym with use of the RW and min assist overall.  Decreased control of the LLE noted with increased ataxia when advancing the LLE.  Once in the gym worked on stand to squat without an assistive device and min guard assist.  Increased lean to the left noted with transition to standing at times.  He completed 2 sets of 10 reps.  He then worked on gaze stabilization in sitting without any increase in dizziness.  Also had him complete them in static standing as well with min guard and no increase in dizziness.  He was able to then work on head turns side to side while sanding as well with increased catchup saccades noted with head turns to the left.  No significant LOB noted while standing with minimal dizziness reported.  Had him complete VOR cancellation testing in sitting which exhibited  slight cogwheeling of the eyes in both directions.  Worked on pre-gait activities to finish in standing with stepping forward and backwards with each LE and taking a few steps forward and backwards without an assistive device.  Decreased coordination noted when stepping forward with the LLE with constant min assist needed for balance.  Returned to the room with use of the RW at mod assist level secondary to fatigue and increased LOB/lean to the left.  Had him ambulate and complete head turns to command instead of point targeting as he has been doing during functional movements.  Once in the room he reported increase in nausea but no increase in dizziness.  He vomited with nursing made aware of request for nausea medicine.  Call button and phone in reach with safety pad in place.    Therapy Documentation Precautions:  Precautions Precautions: Fall,Other (comment) Precaution Comments: orthostatic hypotension which triggers "vertigo"--be extremely careful Restrictions Weight Bearing Restrictions: No RLE Weight Bearing: Weight bearing as tolerated LLE Weight Bearing: Weight bearing as tolerated  Pain: Pain Assessment Pain Scale: 0-10 Pain Score: 0-No pain ADL: See Care Tool Section for some details of mobility and selfcare  Therapy/Group: Individual Therapy  Roseanne Juenger OTR/L 01/06/2021, 12:18 PM

## 2021-01-06 NOTE — Progress Notes (Signed)
Physical Therapy Session Note  Patient Details  Name: Miguel [name] MRN: [id number] Date of Birth: 1956-02-20  Today's Date: 01/06/2021 PT Missed Time: 68 Minutes Missed Time Reason: Patient ill (Comment)  Pt received supine in bed with lights off. Pt reports that he is having nausea and dizziness and states the RN recently administered nausea medication. Pt requests to rest at this time so as to not exacerbate his symptoms. Pt declined any further needs at this time and left supine in bed with needs in reach and bed alarm on. Missed 60 minutes of skilled physical therapy.  Tawana Scale , PT, DPT, CSRS  01/06/2021, 12:25 PM

## 2021-01-06 NOTE — Patient Care Conference (Signed)
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)    Expected Discharge Date: Expected Discharge Date: 01/15/21  Team Members Present: Physician leading conference: Dr. Alysia Penna Care Coodinator Present: Dorien Chihuahua, RN, BSN, CRRN;Christina Tiffin, BSW Nurse Present: Dorien Chihuahua, RN PT Present: Estevan Ryder, PT OT Present: Clyda Greener, OT SLP Present: Junius Argyle, SLP PPS Coordinator present : Gunnar Fusi, SLP     Current Status/Progress Goal Weekly Team Focus  Bowel/Bladder   Pt continent of B/B LBM 01/04/2021  continued continence. BM every three days or less  Assess B/B every shift and PRN   Swallow/Nutrition/ Hydration             ADL's   Supervision for UB selfcare, min assist for LB selfcare, min assist transfers with the RW, max assist without, LUE ataxia, some dizziness  supervision overall  selfcare retraining, balance retraining, DME education, neuromuscular re-education, therapeutici activities,   Mobility   supervision bed mobility, min assist sit<>stand and stand pivot transfers using RW, min/mod assist gait up to 236ft using RW with +2 w/c follow for safety, mod assist 4 steps using B HRs; pt has significant B LE ataxia/incoordination with significant balance impairments; Berg Balance Test 13/56  supervision overall at ambulatory level  activity tolerance, dynamic  standing balance, transfer training, dynamic gait training, pt education, B LE coordination, AD training, B LE strengthening   Communication             Safety/Cognition/ Behavioral Observations  supervision-min A  mod I basic cognition, supervision for complex cognition  medication management and mildly complex problem solving   Pain   Pt denies pain  Pain level <3/10  Assess pain every shift and PRN   Skin   intact skin  no new breakdown  Assess skin every shift and PRN     Discharge Planning:  pt d/c home with spouse. 2level home, 1/2 half bathroom on main and 1 step to enter   Team Discussion: Left cerebellar and right thalamic infarct with cerebellar symptoms more than other. Hx of dystonia and questioning if this plays into problems noted with posturing episodes during ambulation accompanied by "lightheadedness" where patient will suddenly throw self to the right and requires staff to sit him down for safety. Wife not cleared for transfers; and staff should only be completing stand pivot transfers without ambulation to/from the bathroom. Patient limited by ataxia, scissoring gait, poor insight into deficits, problems with error awareness and problem solving. Hiccups improved with baclofen however still present after meals.   Patient on target to meet rehab goals: yes, currently min assist for transfers, toileting, and ambulation. Close supervision for ambulation up to 200' but then has an "episode", overall min assist. Able to manage 4 steps with bil rails and mod assist.   *See Care Plan and progress notes for long and short-term goals.  Revisions to Treatment Plan:   Teaching Needs: Safety, transfers, toileting, steps, medication management, etc.  Current Barriers to Discharge: Decreased caregiver support and Home enviroment access/layout  Possible Resolutions to Barriers: Family training/education     Medical Summary Current Status: intractible hiccups improved on  Baclofen, wife, ataxia on left with dysmetria  Barriers to Discharge: Medical stability;Other (comments)   Possible Resolutions to [employer] Focus: poor safety awareness   Continued Need for Acute Rehabilitation Level of Care: The patient requires daily medical management by a physician with specialized training in physical medicine and rehabilitation for the following reasons: Direction of a multidisciplinary physical rehabilitation program to maximize functional independence : Yes Medical management of patient stability for increased activity during participation in an intensive rehabilitation regime.: Yes Analysis of laboratory values and/or radiology reports with any subsequent need for medication adjustment and/or medical intervention. : Yes   I attest that I was present, lead the team conference, and concur with the assessment and plan of the team.   Dorien Chihuahua B 01/06/2021, 1:01 PM

## 2021-01-06 NOTE — Progress Notes (Signed)
Physical Therapy Session Note  Patient Details  Name: Miguel [name] MRN: [id number] Date of Birth: [birth year]  Today's Date: 01/06/2021 PT Individual Time: 3299-2426 PT Individual Time Calculation (min): 40 min   Short Term Goals: Week 1:  PT Short Term Goal 1 (Week 1): Patient will complete bed <> wc transfers with LRAD and CGA PT Short Term Goal 2 (Week 1): Patient will ambulate >23ft with LRAD and MinA PT Short Term Goal 3 (Week 1): Patient will initiate stair training  Skilled Therapeutic Interventions/Progress Updates:     Pt received supine in bed and agrees to therapy. No complaint of pain. Supine to sit with supervision and cues for positioning at EOB. Pt performs sit to stand with RW and minA, with notable LOB to the L. Pt then performs stand step transfer to [facility] with minA. WC transport to gym for time management. WC>mat with RW and minA. Pt performs sit to stand with RW and completes x20 toe taps with alternating feet on 3 inch step with RW and CGA. Following seated rest break, pt performs x10 toe taps without AD with primarily minA at trunk for stability and x1 instance of modA with LOB to the L. Performed for balance training and reciprocal coordination. WC transport back to room. Pt ambulates x10' to toilet with RW and minA. Left seated on toilet with call bell within reach. RN notified.  Therapy Documentation Precautions:  Precautions Precautions: Fall,Other (comment) Precaution Comments: orthostatic hypotension which triggers "vertigo"--be extremely careful Restrictions Weight Bearing Restrictions: No RLE Weight Bearing: Weight bearing as tolerated LLE Weight Bearing: Weight bearing as tolerated   Therapy/Group: Individual Therapy  Breck Coons, PT, DPT 01/06/2021, 4:06 PM

## 2021-01-06 NOTE — Progress Notes (Signed)
Speech Language Pathology Daily Session Note  Patient Details  Name: Miguel [name] MRN: [id number] Date of Birth: 10/11/1955  Today's Date: 01/06/2021 SLP Individual Time: 5361-4431 SLP Individual Time Calculation (min): 45 min  Short Term Goals: Week 1: SLP Short Term Goal 1 (Week 1): Patient will demonstrate awareness to errors during functional mild complex problem solving tasks with minA cues SLP Short Term Goal 2 (Week 1): Patient will demonstrate recall of specific therapy tasks and medical interventions completed during the day with minA cues. SLP Short Term Goal 3 (Week 1): Patient will perform medication management tasks with minA cues for accuracy. SLP Short Term Goal 4 (Week 1): Patient will answer delayed recall questions based on paragraph level text, with 80% accuracy and minA cues.  Skilled Therapeutic Interventions: Skilled SLP intervention focused on cognition. Pt answered relevant problem solving questions regarding current medications with Min A. He was able to identify which medications are new, times taken, and purpose with supervision using medication list. Min A verbal cues needed for error awareness and attention to details. Pt recalled specific details from short paragraphs following a delay with 65% accuracy mod A verbal cues and visual cues. He continues to require mod A for reasoning cognitive and physical limitations. Cont with therapy per plan of care.      Pain Pain Assessment Pain Scale: Faces Faces Pain Scale: No hurt  Therapy/Group: Individual Therapy  Miguel [name] Miguel [name] 01/06/2021, 2:53 PM

## 2021-01-06 NOTE — Progress Notes (Signed)
Patient ID: Miguel [name], male   DOB: 23-Dec-1955, 65 y.o.   MRN: [id number] Team Conference Report to Patient/Family  Team Conference discussion was reviewed with the patient and caregiver, including goals, any changes in plan of care and target discharge date.  Patient and caregiver express understanding and are in agreement.  The patient has a target discharge date of 01/15/21.  SW met with pt and spouse in pt's room. Spouse concerned about pt being signed off for family to transfer pt to bathroom. Spouse concerned about short staff. SW informed pt spouse that family has not been checked off for this due to safety. Spouse concerned about not being able to schedule or know therapy schedule in advanced. Sw informed pt spouse of scheduling being completed on the previous evening for the following day.  Spouse reports this is not enough of a time frame to plan in advance for her. SW informed spouse if she would like to attend an education session she will need to inform SW in advance to schedule. Spouse will follow up with SW on dates and timeframes that work best for her.   Dyanne Iha 01/06/2021, 1:54 PM

## 2021-01-06 NOTE — Progress Notes (Signed)
PROGRESS NOTE   Subjective/Complaints: No more hiccups since starting baclofen  Pt would like wife (who is RN) to be trained to help as his caregiver   XVQ:MGQQPY dizziness  Objective:   No results found. No results for input(s): WBC, HGB, HCT, PLT in the last 72 hours. Recent Labs    01/04/21 0652  NA 132*  K 3.7  CL 95*  CO2 28  GLUCOSE 172*  BUN 9  CREATININE 0.90  CALCIUM 9.5    Intake/Output Summary (Last 24 hours) at 01/06/2021 0758 Last data filed at 01/05/2021 1857 Gross per 24 hour  Intake 480 ml  Output --  Net 480 ml        Physical Exam: Vital Signs Blood pressure 119/77, pulse 83, temperature 98.7 F (37.1 C), resp. rate 18, weight 90.2 kg, SpO2 96 %. No eructation   General: No acute distress Mood and affect are appropriate Heart: Regular rate and rhythm no rubs murmurs or extra sounds Lungs: Clear to auscultation, breathing unlabored, no rales or wheezes Abdomen: Positive bowel sounds, soft nontender to palpation, nondistended Extremities: No clubbing, cyanosis, or edema Skin: No evidence of breakdown, no evidence of rash   Neurologic: Cranial nerves II through XII intact, motor strength is 5/5 in bilateral deltoid, bicep, tricep, grip, hip flexor, knee extensors, ankle dorsiflexor and plantar flexor Sensory exam normal sensation to light touch and proprioception in bilateral upper and lower extremities Cerebellar exam normal finger to nose to finger as well as heel to shin in RIght  upper and lower extremities, mild/mod dysmetria left UE, normal heel shin LLE  Musculoskeletal: Full range of motion in all 4 extremities. No joint swelling   Assessment/Plan: 1. Functional deficits which require 3+ hours per day of interdisciplinary therapy in a comprehensive inpatient rehab setting.  Physiatrist is providing close team supervision and 24 hour management of active medical problems listed  below.  Physiatrist and rehab team continue to assess barriers to discharge/monitor patient progress toward functional and medical goals  Care Tool:  Bathing    Body parts bathed by patient: Right arm,Left arm,Chest,Abdomen,Front perineal area,Buttocks,Right upper leg,Left upper leg,Face,Right lower leg,Left lower leg         Bathing assist Assist Level: Minimal Assistance - Patient > 75%     Upper Body Dressing/Undressing Upper body dressing   What is the patient wearing?: Pull over shirt    Upper body assist Assist Level: Supervision/Verbal cueing    Lower Body Dressing/Undressing Lower body dressing      What is the patient wearing?: Pants     Lower body assist Assist for lower body dressing: Minimal Assistance - Patient > 75%     Toileting Toileting    Toileting assist Assist for toileting: Supervision/Verbal cueing     Transfers Chair/bed transfer  Transfers assist     Chair/bed transfer assist level: Minimal Assistance - Patient > 75%     Locomotion Ambulation   Ambulation assist   Ambulation activity did not occur: Safety/medical concerns  Assist level: 2 helpers Assistive device: Other (comment) (maxi sky w/ B HHA) Max distance: 46ft   Walk 10 feet activity   Assist  Assist level: Minimal Assistance - Patient > 75% Assistive device: Walker-rolling   Walk 50 feet activity   Assist Walk 50 feet with 2 turns activity did not occur: Safety/medical concerns  Assist level: 2 helpers Assistive device: Walker-rolling    Walk 150 feet activity   Assist Walk 150 feet activity did not occur: Safety/medical concerns  Assist level: 2 helpers Assistive device: Walker-rolling    Walk 10 feet on uneven surface  activity   Assist Walk 10 feet on uneven surfaces activity did not occur: Safety/medical concerns         Wheelchair     Assist Will patient use wheelchair at discharge?: No             Wheelchair 50 feet  with 2 turns activity    Assist            Wheelchair 150 feet activity     Assist          Blood pressure 119/77, pulse 83, temperature 98.7 F (37.1 C), resp. rate 18, weight 90.2 kg, SpO2 96 %.  Medical Problem List and Plan: 1. 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 Impaired mobility -DVT/anticoagulation: Continue Lovenox -antiplatelet therapy: Plavix 75 mg daily and Pletal 100 mg twice daily x3 weeks then Plavix alone 3. Pain Management: Decrease Tramadol to q8H prn 4. Depression: Continue Wellbutrin 150 mg daily, Klonopin 1 mg daily, Effexor 75 mg daily, Ativan as needed -antipsychotic agents: N/A 5. Neuropsych: This patient is capable of making decisions on his own behalf. 6. Skin/Wound Care: Routine skin checks 7. Fluids/Electrolytes/Nutrition: Routine in and outs with follow-up chemistries 8. Hypertension. Continue Avapro 75 mg daily, Norvasc 10 mg daily. Monitor with increased mobility     Vitals:   12/31/20 2010 01/01/21 0423  BP: 128/89 135/85  Pulse: 98 100  Resp: 18 17  Temp: 98.4 F (36.9 C) 99 F (37.2 C)  SpO2: 97% 97%  check orthostatic vitals- majority of readings are normotensive will cont to monitor  9. Hyperlipidemia. Continue Lipitor 10. Cervical dystonia. Patient has been followed in the past by Dr. Evelena Leyden and now Dr Gaye Alken in [facility] and has received Xeomin in the past Don't see much evidence of abnormal head positioning  at this time  11. History of tobacco alcohol use. Alcohol negative on admission. Provide counseling 12. Eczema- might benefit from steroid creams  13.  HypoK mild add KCL 54meq po qd recheck 5/2 14. Urinary rentention: educated regarding  importance of staying well hydrated/voiding regularly throughout the day- it appears to be normal for him at home to void once per day. He has been drinking a lot of fluid.   15. Insomnia: start melatonin 3mg  HS   16.  Hiccups - intractible , r/t cerebellar CVA, trial baclofen , nl GFR  LOS: 6 days A FACE TO FACE EVALUATION WAS PERFORMED  Charlett Blake 01/06/2021, 7:58 AM

## 2021-01-07 NOTE — Progress Notes (Signed)
Physical Therapy Session Note  Patient Details  Name: Miguel [name] MRN: [id number] Date of Birth: Apr 29, 1956  Today's Date: 01/07/2021 PT Individual Time: 2706-2376 PT Individual Time Calculation (min): 65 min   Short Term Goals: Week 1:  PT Short Term Goal 1 (Week 1): Patient will complete bed <> wc transfers with LRAD and CGA PT Short Term Goal 2 (Week 1): Patient will ambulate >44ft with LRAD and MinA PT Short Term Goal 3 (Week 1): Patient will initiate stair training  Skilled Therapeutic Interventions/Progress Updates:    Pt received supine in bed and agreeable to therapy session stating he is feeling much better compared to yesterday. Supine>sitting R EOB, HOB partially elevated, with supervision for safety. Approximately 10seconds after coming to sit on EOB pt has onset of Left torsional nystagmus that is more prominent in R eye compared to L that lasts ~20-25seconds - pt states during this he feels 2/10 dizziness and describes it as also feeling "spacy" with pt stating he feels that he has decreased peripheral vision with triple vision going to solitary vision (states look like therapist had 3 eyes then only 1 eye) that gradually came back into focus. Once symptoms dissipated no additional nystagmus occurred during session. Discussed possible contributions to these symptoms as this tends to occur upon the 1st time pt comes to sitting after lying for a while.   L stand pivot EOB>w/c, no AD, with min assist for balance due to L lateral lean.  Transported to/from gym in w/c for time management and energy conservation. Assessed vitals: BP 101/85 (MAP 91), HR 89bpm L stand pivot to EOM, no AD, with min assist as above. Sitting on mat donned maxi-sky harness. Sit<>stands during session with CGA/min assist and pt relying/compensating heavily on use of backs of legs against mat for stability/safety.   Gait training in maxi-sky harness with B HHA progressed to no HHA focusing on forward walking with  coordinated B LE gait mechanics - pt noted to have L>R LE ataxia today and in general started with decreased fluidity of movements compared to 2 days ago during same task (pt also possibly moving faster today due to familiarity of task causing more impaired coordination). After ~3 laps pt has sudden onset of dry heaving (no clamminess today)- provided emesis bag and warm washcloth. Assessed vitals during this: BP 143/113 (MAP 122), HR 101bpm and reassessed 2 minutes later when symptoms dissipated: BP 144/94 (MAP 106), HR 96bpm and then again 5 minutes after that: BP 126/94 (MAP 104), HR 101bpm. Pt agreeable to continue session. Returned to forward gait training in maxi-sky harness for safety without UE support and min assist for balance recovery when impaired LE coordination caused increased postural sway - worked on turning R vs L when turning around. After ~4laps reassessed vitals: BP 147/92 (MAP 110), HR 108bpm due to pt reporting some pain starting in L posterior neck. Once symptoms dissipated returned to dynamic gait training in maxi-sky harness via R/L lateral side stepping without UE support, when stepping towards L cued for increased R weight shift to allow sufficient L step and then when stepping towards R cued for full and sustained R weight shift while stepping L foot medially. Provided seated rest break and reassessed vitals: BP 150/95 (MAP 107), HR 115bpm . Pt had an increase by 28BTDV in systolic BP during session and a 40mmHg increase in diastolic when performing the same or even less activity than was done 2 days prior with this therapist (and pt was wearing  abdominal binder that day) - notified RN of these findings and pt's symptoms. R stand pivot back to w/c, no AD, min assist for balance. Transported back to room and RN requesting pt rest. Stand pivot to EOB min assist as above. Sit>supine supervision. Pt left supine in bed with needs in reach and bed alarm on.  Therapy  Documentation Precautions:  Precautions Precautions: Fall,Other (comment) Precaution Comments: orthostatic hypotension which triggers "vertigo"--be extremely careful Restrictions Weight Bearing Restrictions: No RLE Weight Bearing: Weight bearing as tolerated LLE Weight Bearing: Weight bearing as tolerated  Pain:   Reports sudden onset of L posterior neck pain with activity, difficult to determine if something specifics provokes these symptoms or if it is related to his hx of cervical dystonia. Provided rest breaks and distraction for pain management. Recommended pt discuss dry needling with MD to see if that would be beneficial to help manage these symptoms of they are related to dystonia.   Therapy/Group: Individual Therapy  Tawana Scale , PT, DPT, CSRS  01/07/2021, 2:38 PM

## 2021-01-07 NOTE — Progress Notes (Addendum)
Patient ID: Miguel [name], male   DOB: 1956/01/24, 65 y.o.   MRN: [id number] Follow up with the patient. Reviewed team conference discussion of hiccups and "episodes". Patient noted he had discussed torticollis hx with the physician and missing injections now going on 9 months. Feels like if he could get an injection, he could get the the torticollis under control and his ability to walk without an "episode" would improve. MD to follow up with his neurologist per Pt. No other concerns noted at present. Feels like he will be ready to go home within another week. Continue to follow along to discharge to address educational needs. Margarito Liner

## 2021-01-07 NOTE — Progress Notes (Addendum)
Occupational Therapy Weekly Progress Note  Patient Details  Name: Miguel [name] MRN: [id number] Date of Birth: 1956/01/29  Beginning of progress report period: January 01, 2021 End of progress report period: Jan 07, 2021  Today's Date: 01/07/2021 OT Individual Time: 0802-0902 OT Individual Time Calculation (min): 60 min    Patient has met 2 of 5 short term goals.  Miguel [name] is making steady progress with OT at this time.  He is able to complete UB selfcare at setup level with LB selfcare at min to min guard assist sit to stand.  Transfers to the walk-in shower as well as toilet are min assist overall with use of the RW and min to mod without.  Functional mobility in the room when gathering items for selfcare tasks is at a min to mod assist level.  He continues to demonstrate slight decreased coordination in the LUE but uses it functionally at a diminished level, demonstrating the ability to tie his shoes with a slight increased ataxia.  Decreased strength, coordination, and slight ataxia is still present in the LLE with functional mobility as well, resulting in increased left bias in standing with some LOB, especially when standing for longer periods of time or during ambulation.  Dizziness overall has been minimal at a 3/10 level or less but he has had a couple of episodes of extensive horizontal nystagmus lasting for a few seconds.  This has not been reproducible with positional changes when tested.  Slight resting horizontal nystagmus is present at periods of time.  Overall, feel he is making steady progress with OT at this time in progression toward established supervision level goals.  Will continue to recommend CIR level therapy with expected discharge on 5/13.  Will incorporate family education with his spouse when possible.    Patient continues to demonstrate the following deficits: muscle weakness, impaired timing and sequencing, unbalanced muscle activation, ataxia and decreased coordination and  central origin and therefore will continue to benefit from skilled OT intervention to enhance overall performance with BADL and Reduce care partner burden.  Patient progressing toward long term goals..  Continue plan of care.  OT Short Term Goals Week 2:  OT Short Term Goal 1 (Week 2): Continue working on established LTGs set at supervision level overall.  Skilled Therapeutic Interventions/Progress Updates:    Session 1: ([id number])  Pt seen for ADL session to start as he has not been able to shower recently.  He reported slight dizziness at a 1-3/10 during session with occasional nausea but not to the point of being sick.  Completed BP check in sitting at 146/94 and in standing 132/80 without abdominal binder in place.  Pt with no report of light headedness with sitting or standing.  He completed functional mobility to the shower bench without assistive device and min assist, occasional placing a hand on the wall or door frames.  Ataxia noted at times in the LLE when attempting stepping or positioning of it.  He was able to undress at min guard assist overall sit to stand with use of the grab bar for support as needed.  He completed shower at supervision level in sitting without any standing.  Once he dried off, he was able to ambulate out to gather clothing at a min assist level without a device and complete dressing from the EOB with min guard to min assist sit to stand.  He next ambulated to the bathroom with min assist to gather up dirty towels and clothing to place  in the proper areas as well as standing at the sink to work on his shaving.  Min guard for static standing when using the shaver.  He sat down briefly to rest secondary to LLE weakness as well as a slight feeling of nausea.  Once he felt OK he completed functional mobility down to the ortho gym without an assistive device and overall mod assist.  Incorporated point targeting with mobility as well as encouragement to walk slower and take  slightly smaller steps on the left.  Initially, he was able to ambulate with min assist but eventually this would increase to mod secondary to ataxic movement and decreased balance.  He rested a couple of mins in the gym before ambulating back to the room which was overall mod assist with decreased efficiency and increased lean/LOB to the left noted when compared to walking down to the gym.  Finished session with pt resting in the wheelchair and with the call button and phone in reach and safety alarm pad in place.    Session 2: ([id number]) Pt resting in bed with HOB slightly elevated to start.  He raised his bed up slightly and reported a slight increase in his dizziness.  Next with transition to sitting EOB, he reported and increase in his dizziness and it was noted that he had moderate horizontal nystagmus for approximately 10 seconds.  This resolved quickly, with this being the first time this had occurred with this therapist.  Worked during remainder of session on testing of inner ear canals as well as changes in positioning from supine to left and right as well as transitions to and from sitting.  He would exhibit slight vertical ticking of the eyes at times, but not nystagmus that was seen earlier.  Will continue to further evaluate in future sessions.  Finished session with pt transitioning to supine to rest in preparation for PT session later.  Call button and phone in reach.    Therapy Documentation Precautions:  Precautions Precautions: Fall,Other (comment) Precaution Comments: orthostatic hypotension which triggers "vertigo"--be extremely careful Restrictions Weight Bearing Restrictions: No RLE Weight Bearing: Weight bearing as tolerated LLE Weight Bearing: Weight bearing as tolerated Pain:  No report of pain ADL: See Care Tool Section for some details of mobility and selfcare  Therapy/Group: Individual Therapy  Persephone Schriever OTR/L 01/07/2021, 12:59 PM

## 2021-01-07 NOTE — Progress Notes (Signed)
PROGRESS NOTE   Subjective/Complaints: No more hiccups since starting baclofen  Pt would like wife (who is RN) to be trained to help as his caregiver   ASN:[id number] dizziness, bowels ok, No SOB or CP  Objective:   No results found. No results for input(s): WBC, HGB, HCT, PLT in the last 72 hours. No results for input(s): NA, K, CL, CO2, GLUCOSE, BUN, CREATININE, CALCIUM in the last 72 hours. No intake or output data in the 24 hours ending 01/07/21 0750      Physical Exam: Vital Signs Blood pressure 117/69, pulse 81, temperature 98.2 F (36.8 C), resp. rate 18, weight 90.2 kg, SpO2 97 %. No eructation  General: No acute distress Mood and affect are appropriate Heart: Regular rate and rhythm no rubs murmurs or extra sounds Lungs: Clear to auscultation, breathing unlabored, no rales or wheezes Abdomen: Positive bowel sounds, soft nontender to palpation, nondistended Extremities: No clubbing, cyanosis, or edema Skin: No evidence of breakdown, no evidence of rash  Neurologic: Cranial nerves II through XII intact, motor strength is 5/5 in bilateral deltoid, bicep, tricep, grip, hip flexor, knee extensors, ankle dorsiflexor and plantar flexor Sensory exam normal sensation to light touch and proprioception in bilateral upper and lower extremities Cerebellar exam normal finger to nose to finger as well as heel to shin in RIght  upper and lower extremities, mild/mod dysmetria left UE, normal heel shin LLE  Musculoskeletal: Full range of motion in all 4 extremities. No joint swelling   Assessment/Plan: 1. Functional deficits which require 3+ hours per day of interdisciplinary therapy in a comprehensive inpatient rehab setting.  Physiatrist is providing close team supervision and 24 hour management of active medical problems listed below.  Physiatrist and rehab team continue to assess barriers to discharge/monitor patient progress  toward functional and medical goals  Care Tool:  Bathing    Body parts bathed by patient: Right arm,Left arm,Chest,Abdomen,Front perineal area,Buttocks,Right upper leg,Left upper leg,Face,Right lower leg,Left lower leg         Bathing assist Assist Level: Minimal Assistance - Patient > 75%     Upper Body Dressing/Undressing Upper body dressing   What is the patient wearing?: Pull over shirt    Upper body assist Assist Level: Supervision/Verbal cueing    Lower Body Dressing/Undressing Lower body dressing      What is the patient wearing?: Pants     Lower body assist Assist for lower body dressing: Minimal Assistance - Patient > 75%     Toileting Toileting    Toileting assist Assist for toileting: Supervision/Verbal cueing     Transfers Chair/bed transfer  Transfers assist     Chair/bed transfer assist level: Minimal Assistance - Patient > 75%     Locomotion Ambulation   Ambulation assist   Ambulation activity did not occur: Safety/medical concerns  Assist level: Moderate Assistance - Patient 50 - 74% Assistive device: Walker-rolling Max distance: 150'   Walk 10 feet activity   Assist     Assist level: Minimal Assistance - Patient > 75% Assistive device: Walker-rolling   Walk 50 feet activity   Assist Walk 50 feet with 2 turns activity did not occur: Safety/medical concerns  Assist level: 2 helpers Assistive device: Walker-rolling    Walk 150 feet activity   Assist Walk 150 feet activity did not occur: Safety/medical concerns  Assist level: 2 helpers Assistive device: Walker-rolling    Walk 10 feet on uneven surface  activity   Assist Walk 10 feet on uneven surfaces activity did not occur: Safety/medical concerns         Wheelchair     Assist Will patient use wheelchair at discharge?: No             Wheelchair 50 feet with 2 turns activity    Assist            Wheelchair 150 feet activity      Assist          Blood pressure 117/69, pulse 81, temperature 98.2 F (36.8 C), resp. rate 18, weight 90.2 kg, SpO2 97 %.  Medical Problem List and Plan: 1. 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 Impaired mobility -DVT/anticoagulation: Continue Lovenox -antiplatelet therapy: Plavix 75 mg daily and Pletal 100 mg twice daily x3 weeks then Plavix alone 3. Pain Management: Decrease Tramadol to q8H prn 4. Depression: Continue Wellbutrin 150 mg daily, Klonopin 1 mg daily, Effexor 75 mg daily, Ativan as needed -antipsychotic agents: N/A 5. Neuropsych: This patient is capable of making decisions on his own behalf. 6. Skin/Wound Care: Routine skin checks 7. Fluids/Electrolytes/Nutrition: Routine in and outs with follow-up chemistries 8. Hypertension. Continue Avapro 75 mg daily, Norvasc 10 mg daily. Monitor with increased mobility     Vitals:   12/31/20 2010 01/01/21 0423  BP: 128/89 135/85  Pulse: 98 100  Resp: 18 17  Temp: 98.4 F (36.9 C) 99 F (37.2 C)  SpO2: 97% 97%  check orthostatic vitals- majority of readings are normotensive will cont to monitor  9. Hyperlipidemia. Continue Lipitor 10. Cervical dystonia. Patient has been followed in the past by Dr. Evelena Leyden and now Dr Gaye Alken in [facility] and has received Xeomin in the past Don't see much evidence of abnormal head positioning  at this time , but pt has intermittent head turns to left that contributes to LOB, last botulinum toxin injection ~31mo ago, lost insurance 11. History of tobacco alcohol use. Alcohol negative on admission. Provide counseling 12. Eczema- might benefit from steroid creams  13.  HypoK mild add KCL 42meq po qd recheck 5/2 14.  Urinary rentention: educated regarding importance of staying well hydrated/voiding regularly throughout the day- it appears to be normal for him at home to void once per day. He has been drinking a lot of fluid.   15. Insomnia: start melatonin 3mg  HS   16.  Hiccups - intractible , r/t cerebellar CVA, trial baclofen , nl GFR  LOS: 7 days A FACE TO FACE EVALUATION WAS PERFORMED  Charlett Blake 01/07/2021, 7:50 AM

## 2021-01-07 NOTE — Progress Notes (Addendum)
Speech Language Pathology Discharge Summary  Patient Details  Name: Miguel [name] MRN: [id number] Date of Birth: 03/09/1956  Today's Date: 01/07/2021 SLP Individual Time: 1345-1400 SLP Individual Time Calculation (min): 15 min   Skilled Therapeutic Interventions:  Patient seen briefly to discuss therapy progress and patient's desire to cease SLP interventions at this time. Patient demonstrated adequate reasoning when explaining his rationale for stopping ST services. SLP reiterated that interventions were focused on memory and cognitive processing and not speech production. Patient to be discharged at this time from [place] services.    Patient has met  0 of 3 long term goals.  Patient to discharge at overall Supervision level.  Reasons goals not met: Patient was making progress with goals but he did not wish to continue with SLP intervention   Clinical Impression/Discharge Summary: Patient was making progress towards memory and complex level problem solving goals and per recent notes, has been demonstrating ability to improve from minA to supervision level with complex problem solving. He is also demonstrating more involvement in his care (using MyChart and reviewing therapy notes, etc). Patient does not wish to continue with SLP intervention at this time and plans to continue with PT and OT. SLP to discharge patient at this time due to his request.  Care Partner:  Caregiver Able to Provide Assistance: Yes  Type of Caregiver Assistance: Physical;Cognitive  Recommendation:  None      Equipment: None   Reasons for discharge: Other (comment) (Patient does not wish to continue with SLP intervention)   Patient/Family Agrees with Progress Made and Goals Achieved: Yes    Sonia Baller, MA, CCC-SLP Speech Therapy

## 2021-01-08 NOTE — Progress Notes (Signed)
PROGRESS NOTE   Subjective/Complaints:  No issues overnite , has dizziness with position change but no sig orthostatic changes POE:UMPNTI dizziness, bowels ok, No SOB or CP  Objective:   No results found. No results for input(s): WBC, HGB, HCT, PLT in the last 72 hours. No results for input(s): NA, K, CL, CO2, GLUCOSE, BUN, CREATININE, CALCIUM in the last 72 hours. No intake or output data in the 24 hours ending 01/08/21 0820      Physical Exam: Vital Signs Blood pressure 136/85, pulse 80, temperature 98.4 F (36.9 C), temperature source Oral, resp. rate 20, weight 90.2 kg, SpO2 94 %. No eructation  General: No acute distress Mood and affect are appropriate Heart: Regular rate and rhythm no rubs murmurs or extra sounds Lungs: Clear to auscultation, breathing unlabored, no rales or wheezes Abdomen: Positive bowel sounds, soft nontender to palpation, nondistended Extremities: No clubbing, cyanosis, or edema Skin: No evidence of breakdown, no evidence of rash  Neurologic: Cranial nerves II through XII intact, motor strength is 5/5 in bilateral deltoid, bicep, tricep, grip, hip flexor, knee extensors, ankle dorsiflexor and plantar flexor Sensory exam normal sensation to light touch and proprioception in bilateral upper and lower extremities Cerebellar exam normal finger to nose to finger as well as heel to shin in RIght  upper and lower extremities, mild/mod dysmetria left UE, normal heel shin LLE  Musculoskeletal: Full range of motion in all 4 extremities. No joint swelling   Assessment/Plan: 1. Functional deficits which require 3+ hours per day of interdisciplinary therapy in a comprehensive inpatient rehab setting.  Physiatrist is providing close team supervision and 24 hour management of active medical problems listed below.  Physiatrist and rehab team continue to assess barriers to discharge/monitor patient progress  toward functional and medical goals  Care Tool:  Bathing    Body parts bathed by patient: Right arm,Left arm,Chest,Abdomen,Front perineal area,Right upper leg,Left upper leg,Face,Right lower leg,Left lower leg     Body parts n/a: Buttocks   Bathing assist Assist Level: Supervision/Verbal cueing (sitting)     Upper Body Dressing/Undressing Upper body dressing   What is the patient wearing?: Pull over shirt    Upper body assist Assist Level: Supervision/Verbal cueing    Lower Body Dressing/Undressing Lower body dressing      What is the patient wearing?: Pants     Lower body assist Assist for lower body dressing: Minimal Assistance - Patient > 75%     Toileting Toileting    Toileting assist Assist for toileting: Supervision/Verbal cueing     Transfers Chair/bed transfer  Transfers assist     Chair/bed transfer assist level: Minimal Assistance - Patient > 75%     Locomotion Ambulation   Ambulation assist   Ambulation activity did not occur: Safety/medical concerns  Assist level: Minimal Assistance - Patient > 75% Assistive device: Maxi Sky Max distance: 55ft   Walk 10 feet activity   Assist     Assist level: Minimal Assistance - Patient > 75% Assistive device: Maxi Sky   Walk 50 feet activity   Assist Walk 50 feet with 2 turns activity did not occur: Safety/medical concerns  Assist level: Minimal Assistance - Patient >  75% Assistive device: Maxi Sky    Walk 150 feet activity   Assist Walk 150 feet activity did not occur: Safety/medical concerns  Assist level: 2 helpers Assistive device: Walker-rolling    Walk 10 feet on uneven surface  activity   Assist Walk 10 feet on uneven surfaces activity did not occur: Safety/medical concerns         Wheelchair     Assist Will patient use wheelchair at discharge?: No             Wheelchair 50 feet with 2 turns activity    Assist            Wheelchair 150 feet  activity     Assist          Blood pressure 136/85, pulse 80, temperature 98.4 F (36.9 C), temperature source Oral, resp. rate 20, weight 90.2 kg, SpO2 94 %.  Medical Problem List and Plan: 1. 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 Impaired mobility -DVT/anticoagulation: Continue Lovenox -antiplatelet therapy: Plavix 75 mg daily and Pletal 100 mg twice daily x3 weeks then Plavix alone 3. Pain Management: Decrease Tramadol to q8H prn 4. Depression: Continue Wellbutrin 150 mg daily, Klonopin 1 mg daily, Effexor 75 mg daily, Ativan as needed -antipsychotic agents: N/A 5. Neuropsych: This patient is capable of making decisions on his own behalf. 6. Skin/Wound Care: Routine skin checks 7. Fluids/Electrolytes/Nutrition: Routine in and outs with follow-up chemistries 8. Hypertension. Continue Avapro 75 mg daily, Norvasc 10 mg daily. Monitor with increased mobility     Vitals:   12/31/20 2010 01/01/21 0423  BP: 128/89 135/85  Pulse: 98 100  Resp: 18 17  Temp: 98.4 F (36.9 C) 99 F (37.2 C)  SpO2: 97% 97%  Controlled 5/6  9. Hyperlipidemia. Continue Lipitor 10. Cervical dystonia. Patient has been followed in the past by Dr. Evelena Leyden and now Dr Gaye Alken in [facility] and has received Xeomin in the past Don't see much evidence of abnormal head positioning  at this time , but pt has intermittent head turns to left that contributes to LOB, last botulinum toxin injection ~68mo ago, lost insurance 11. History of tobacco alcohol use. Alcohol negative on admission. Provide counseling 12. Eczema- might benefit from steroid creams  13.  HypoK mild add KCL 89meq po qd recheck 5/2 14. Urinary rentention: educated  regarding importance of staying well hydrated/voiding regularly throughout the day- it appears to be normal for him at home to void once per day. He has been drinking a lot of fluid.   15. Insomnia: start melatonin 3mg  HS   16.  Hiccups - intractible , r/t cerebellar CVA, trial baclofen , nl GFR  LOS: 8 days A FACE TO FACE EVALUATION WAS PERFORMED  Charlett Blake 01/08/2021, 8:20 AM

## 2021-01-08 NOTE — Progress Notes (Signed)
Occupational Therapy Session Note  Patient Details  Name: Miguel [name] MRN: [id number] Date of Birth: 28-Nov-1955  Today's Date: 01/08/2021 OT Individual Time: 0802-0903 OT Individual Time Calculation (min): 61 min    Short Term Goals: Week 2:  OT Short Term Goal 1 (Week 2): Continue working on established LTGs set at supervision level overall.  Skilled Therapeutic Interventions/Progress Updates:    Pt in bed eating breakfast to start session.  He reported dry heaving this morning when he first put the [facility] up, but now feels OK.  He transferred to the EOB with supervision and BP was obtained in sitting at 140/95.  He donned his socks and shoes with setup and nursing administered medications.  BP also checked in standing at 129/90 with min guard assist for balance.  He next completed functional mobility down to the ortho gym with use of the RW and min assist.  Increased left lean noted at times with pt stopping and trying to self correct on numerous occasions as he is aware of it.  Once in the gym had him sit on a therapy mat to work on LUE coordination.  He completed the Nine Hole Peg Test in 35 seconds using the right hand and then in 49 seconds with the left.  Had him also work on picking up pegs with the left hand one at a time until 2-3 were in hand then work on translating them out to his fingertips to place in the peg holes.  Slight ataxia noted when peg board was placed further away.  Finished with work on toss and catch with a Science writer using BUEs to catch it progressing to use of the LUE only for tossing it.  He was able to catch it approximately 70% of the time, but needed use of his body and not just hands on 50% of those.  He tossed it back with the LUE without much difficulty.  Finished session with return to the room via walker and min assist.  Pt still with increased left lean, but slightly better mobility this time compared to initial ambulation to the therapy gym.  He was left in the  recliner in preparation for next session with PT.  Call button and phone in reach with safety alarm in place.  Pt with no significant increase in dizziness with any tasks this session and no nausea reported.    Therapy Documentation Precautions:  Precautions Precautions: Fall,Other (comment) Precaution Comments: orthostatic hypotension which triggers "vertigo"--be extremely careful Restrictions Weight Bearing Restrictions: No RLE Weight Bearing: Weight bearing as tolerated LLE Weight Bearing: Weight bearing as tolerated Other Position/Activity Restrictions: left lean in standing as well as sitting sometimes   Pain: Pain Assessment Pain Scale: Faces Pain Score: 0-No pain ADL: See Care Tool Section for some details of mobility and selfcare  Therapy/Group: Individual Therapy  Sung Renton OTR/L 01/08/2021, 9:41 AM

## 2021-01-08 NOTE — Progress Notes (Signed)
Physical Therapy Weekly Progress Note  Patient Details  Name: Miguel [name]  MRN: [id number] Date of Birth: 13-Apr-1956  Beginning of progress report period: January 01, 2021 End of progress report period: Jan 08, 2021  Today's Date: 01/08/2021 PT Individual Time: 0915-1028 PT Individual Time Calculation (min): 73 min   Patient has met 2 of 3 short term goals.  Patient is making steady progress toward his goals. He remains grossly limited by poor coordination, ataxia (L>R), limited dynamic/ambulatory balance and OH that results in "vertigo-like" episodes. He requires up to [place] to ambulate limited community distances with RW due to LE ataxia and is able to negotiate up to 4 stairs with ModA. His BP fluctuates, sometimes >[id number] systolic, when completing the same same task. He has demonstrated R torsional, L torsional and L horizontal nystagmus when completing different tasks as well with no consistency noted. These "episodes" put the patient at significant risk for falling.   Patient continues to demonstrate the following deficits muscle weakness, decreased cardiorespiratoy endurance, decreased visual motor skills, decreased attention to left and decreased motor planning, decreased awareness, decreased problem solving, decreased safety awareness and decreased memory, central origin and decreased sitting balance, decreased standing balance, decreased postural control and decreased balance strategies and therefore will continue to benefit from skilled PT intervention to increase functional independence with mobility.  Patient progressing toward long term goals..  Continue plan of care.  PT Short Term Goals Week 1:  PT Short Term Goal 1 (Week 1): Patient will complete bed <> wc transfers with LRAD and CGA PT Short Term Goal 1 - Progress (Week 1): Progressing toward goal PT Short Term Goal 2 (Week 1): Patient will ambulate >51ft with LRAD and MinA PT Short Term Goal 2 - Progress (Week 1): Met PT Short  Term Goal 3 (Week 1): Patient will initiate stair training PT Short Term Goal 3 - Progress (Week 1): Met Week 2:  PT Short Term Goal 1 (Week 2): STG= LTG based on ELOS  Skilled Therapeutic Interventions/Progress Updates:    Patient received sitting up in recliner, agreeable to PT. He denies pain. Patient able to transfer to wc via stand pivot with MinA and no AD. PT transporting patient in wc to therapy gym for time management and energy conservation. BP sitting: 126/76 (88) 91BPM. No abdominal binder used throughout session. Patient standing up with RW to ambulate ~44ft to therapy mat, CGA. Upon ambulating to therapy mat, patient looking up to see where track was for [facility] (PT asking him to align himself with the track). When patient looked back down, R torsional nystagmus noted- patient sitting abruptly, but safely onto mat with strong R lateral lean in sitting. Nystagmus lasted ~20-25s in B eyes. BP: 138/88 (102) 93BPM. Once nystagmus had fatigued, patient agreeable to continue with therapy. He was also agreeable to attempt recreating circumstances that led to "episode." MaxiSKy harness donned. Patient standing with RW + maxisky with CGA and looking up. He had to look up for ~5-7s and then look down to recreate nystagmus. Patient sitting safely, except he had R horizontal nystagmus noted this time (no rotational component observed). Nystagmus lasted ~20-25s in B eyes. Strong R lateral lean in sitting noted again as well. BP with this "episode:" 143/90 (106) 91BPM. Patient reports that he can feel these "episodes" coming on. When asked to describe what he was feeling he stated that up the posterior left side of his neck he would feel a "shooting," but did not describe this as a  pain that he felt. PT discussed with patient that until this central vestibular issue had subsided, this PT advises patient to not stand up and look up immediately or for prolonged periods of time. Patient verbalized understanding.  Patient completing cone tapping task in MaxiSky once with RW and once without RW. Patient instructed to emphasize full weight shift and slow/controlled movement of contralateral leg to cone. Patient with no significant LOB, even without RW. Appropriate stepping strategy noted B to regain balance after minimal LOB. Patient did self-select wider BOS when completing task without RW. Patient ambulating a total of 77ft in MaxiSky with RW. Alternating between turning R and turning L at end of track. When turning over R shoulder, patient with more frequent and substantial LOB due to L LE drifting midline compounded by patients baseline L lateral lean. CGA provided throughout, but patient able to take appropriate step with L LE laterally to assist with regaining balance. Patient completing same task with no AD. More B LE ataxia noted, L >R with wider BOS and slower gait speed. Minimal reciprocal arm swing and high guard maintained in order to assist with maintaining balance. Patient then ambulating 146ft with RW and CGA/MinA. BP 135/97 (105) 108BPM. Patient completing same route, ambulating 171ft with RW and BP was 154/89 (101) 109BPM. Patient reports that he cannot feel when BP is fluctuating, unless he gets one of his "episodes." Patient returning to room in wc, transferring to bed per patient request. Bed alarm on, call light within reach.  Therapy Documentation Precautions:  Precautions Precautions: Fall,Other (comment) Precaution Comments: orthostatic hypotension which triggers "vertigo"--be extremely careful Restrictions Weight Bearing Restrictions: No RLE Weight Bearing: Weight bearing as tolerated LLE Weight Bearing: Weight bearing as tolerated   Therapy/Group: Individual Therapy  Debbora Dus 01/08/2021, 7:50 AM

## 2021-01-08 NOTE — Progress Notes (Signed)
Physical Therapy Session Note  Patient Details  Name: Miguel [name] MRN: [id number] Date of Birth: 09/04/1956  Today's Date: 01/08/2021 PT Individual Time: 1448-1534 PT Individual Time Calculation (min): 46 min   Short Term Goals: Week 1:  PT Short Term Goal 1 (Week 1): Patient will complete bed <> wc transfers with LRAD and CGA PT Short Term Goal 1 - Progress (Week 1): Progressing toward goal PT Short Term Goal 2 (Week 1): Patient will ambulate >15ft with LRAD and MinA PT Short Term Goal 2 - Progress (Week 1): Met PT Short Term Goal 3 (Week 1): Patient will initiate stair training PT Short Term Goal 3 - Progress (Week 1): Met Week 2:  PT Short Term Goal 1 (Week 2): STG= LTG based on ELOS  Skilled Therapeutic Interventions/Progress Updates:  Patient supine in bed on entrance to room. Patient alert and agreeable to PT session. Patient denied pain during session.  Therapeutic Activity: Bed Mobility: Patient performed supine --> sit with supervision requiring time to prepare for movement following raise of HOB. At end of session, pt is able to bring LLE to bed surface with CGA and RLE with supervision. Min A +2 for improvement of positioning toward HOB in order to prevent pt's dizziness. Pt assists with BLE push into extension. VC/ tc required for technique/ positioning throughout. Transfers: Patient performed STS from EOB to stand with no AD and requires Bil knee extensor push into bed for steadying. CGA to complete with Min A to maintain balance once standing until pt is able to hold midline positioning in stance. Uses elbow hold on therapist's elbows for steadying to perform SPVT to w/c. Pt demos several leans to L side just prior to initiating pivot turning and during turn. VC/ tc throughout for maintaining midline orientation.   Gait Training:  Patient ambulated 361' x2 using RW with overall CGA and intermittent Min A for mild LOB to L side.  Demonstrated flexed posture, crowding to L side of  RW, minimal weight shift over RLE. Provided vc/ tc for increasing weight shift to R. Improved form with vc for pretending to be on ice skates and pushing off with each blade in order to propel forward to other foot.   Neuromuscular Re-ed: NMR facilitated during session with focus on standing dynamic balance and static weight shifting. Pt guided in need for dynamic step forward using RW. Demos difficulty in maintaining balance when BOS is narrowed with step forward and requiring lift of one hand in order to touch target. Pt requires several instances of Min A to regain balance following Minimal LOB. Pt able to perform stepping strategy twice during session to assist with maintaining balance. Pt reaches for targets outside of BOS from below waist to head height on wall. Pt also guided in ready position stance, use of RW and shifting of weight A-P onto toes, then onto heels and maintaining balance. Then pt guided in weight shifting on edge of BOS starting over L heel, then L toe, over to R toe and back to R heel. Pt is able to perform and adjust weight shift into called positions from therapist as well as drawing CW and CCW rectangle over BOS.  One episode of quick move to sit following fast change in head postitioning which causes dizziness and Bil eye nystagmus with R worse than L. NMR performed for improvements in motor control and coordination, balance, sequencing, judgement, and self confidence/ efficacy in performing all aspects of mobility at highest level of independence.     Patient supine in bed at end of session with brakes locked, bed alarm set, and all needs within reach.     Therapy Documentation Precautions:  Precautions Precautions: Fall,Other (comment) Precaution Comments: orthostatic hypotension which triggers "vertigo"--be extremely careful Restrictions Weight Bearing Restrictions: No RLE Weight Bearing: Weight bearing as tolerated LLE Weight Bearing: Weight bearing as tolerated Other  Position/Activity Restrictions: left lean in standing as well as sitting sometimes  Therapy/Group: Individual Therapy  Alger Simons Pt, DPT 01/08/2021, 7:23 PM

## 2021-01-09 MED ORDER — SORBITOL 70 % SOLN
30.0000 mL | Freq: Once | Status: DC
  Filled 2021-01-09: qty 30

## 2021-01-09 NOTE — Progress Notes (Signed)
PROGRESS NOTE   Subjective/Complaints:   Pt reports no issues- LBM "2 days ago" but denies any constipation.  Based on chart, appears to be 5/4- Denies pain and orthostasis.     ROS: Pt denies SOB, abd pain, CP, N/V/C/D, and vision changes    Objective:   No results found. No results for input(s): WBC, HGB, HCT, PLT in the last 72 hours. No results for input(s): NA, K, CL, CO2, GLUCOSE, BUN, CREATININE, CALCIUM in the last 72 hours.  Intake/Output Summary (Last 24 hours) at 01/09/2021 1029 Last data filed at 01/09/2021 0824 Gross per 24 hour  Intake 476 ml  Output --  Net 476 ml        Physical Exam: Vital Signs Blood pressure 113/65, pulse 85, temperature 98.2 F (36.8 C), resp. rate 18, weight 90.2 kg, SpO2 96 %. No eructation   General: awake, alert, appropriate, sitting up in bed;  NAD HENT: conjugate gaze; oropharynx moist CV: regular rate; no JVD Pulmonary: CTA B/L; no W/R/R- good air movement GI: soft, NT, ND, (+)BS Psychiatric: appropriate- decreased interaction/sleepy Neurological: alert   Neurologic: Cranial nerves II through XII intact, motor strength is 5/5 in bilateral deltoid, bicep, tricep, grip, hip flexor, knee extensors, ankle dorsiflexor and plantar flexor Sensory exam normal sensation to light touch and proprioception in bilateral upper and lower extremities Cerebellar exam normal finger to nose to finger as well as heel to shin in RIght  upper and lower extremities, mild/mod dysmetria left UE, normal heel shin LLE  Musculoskeletal: Full range of motion in all 4 extremities. No joint swelling   Assessment/Plan: 1. Functional deficits which require 3+ hours per day of interdisciplinary therapy in a comprehensive inpatient rehab setting.  Physiatrist is providing close team supervision and 24 hour management of active medical problems listed below.  Physiatrist and rehab team continue to  assess barriers to discharge/monitor patient progress toward functional and medical goals  Care Tool:  Bathing    Body parts bathed by patient: Right arm,Left arm,Chest,Abdomen,Front perineal area,Right upper leg,Left upper leg,Face,Right lower leg,Left lower leg     Body parts n/a: Buttocks   Bathing assist Assist Level: Supervision/Verbal cueing (sitting)     Upper Body Dressing/Undressing Upper body dressing   What is the patient wearing?: Pull over shirt    Upper body assist Assist Level: Supervision/Verbal cueing    Lower Body Dressing/Undressing Lower body dressing      What is the patient wearing?: Pants     Lower body assist Assist for lower body dressing: Minimal Assistance - Patient > 75%     Toileting Toileting    Toileting assist Assist for toileting: Supervision/Verbal cueing     Transfers Chair/bed transfer  Transfers assist     Chair/bed transfer assist level: Contact Guard/Touching assist     Locomotion Ambulation   Ambulation assist   Ambulation activity did not occur: Safety/medical concerns  Assist level: Minimal Assistance - Patient > 75% Assistive device: Walker-rolling Max distance: 150   Walk 10 feet activity   Assist     Assist level: Minimal Assistance - Patient > 75% Assistive device: Walker-rolling   Walk 50 feet activity   Assist  Walk 50 feet with 2 turns activity did not occur: Safety/medical concerns  Assist level: Minimal Assistance - Patient > 75% Assistive device: Walker-rolling    Walk 150 feet activity   Assist Walk 150 feet activity did not occur: Safety/medical concerns  Assist level: Minimal Assistance - Patient > 75% Assistive device: Walker-rolling    Walk 10 feet on uneven surface  activity   Assist Walk 10 feet on uneven surfaces activity did not occur: Safety/medical concerns         Wheelchair     Assist Will patient use wheelchair at discharge?: No              Wheelchair 50 feet with 2 turns activity    Assist            Wheelchair 150 feet activity     Assist          Blood pressure 113/65, pulse 85, temperature 98.2 F (36.8 C), resp. rate 18, weight 90.2 kg, SpO2 96 %.  Medical Problem List and Plan: 1. 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 Impaired mobility -DVT/anticoagulation: Continue Lovenox -antiplatelet therapy: Plavix 75 mg daily and Pletal 100 mg twice daily x3 weeks then Plavix alone 3. Pain Management: Decrease Tramadol to q8H prn  5/7- reports pain controlled- con't regimen 4. Depression: Continue Wellbutrin 150 mg daily, Klonopin 1 mg daily, Effexor 75 mg daily, Ativan as needed -antipsychotic agents: N/A 5. Neuropsych: This patient is capable of making decisions on his own behalf. 6. Skin/Wound Care: Routine skin checks 7. Fluids/Electrolytes/Nutrition: Routine in and outs with follow-up chemistries 8. Hypertension. Continue Avapro 75 mg daily, Norvasc 10 mg daily. Monitor with increased mobility     Vitals:   12/31/20 2010 01/01/21 0423  BP: 128/89 135/85  Pulse: 98 100  Resp: 18 17  Temp: 98.4 F (36.9 C) 99 F (37.2 C)  SpO2: 97% 97%  5/7- BP controlled- con't regimen 9. Hyperlipidemia. Continue Lipitor 10. Cervical dystonia. Patient has been followed in the past by Dr. Evelena Leyden and now Dr Gaye Alken in [facility] and has received Xeomin in the past Don't see much evidence of abnormal head positioning  at this time , but pt has intermittent head turns to left that contributes to LOB, last botulinum toxin injection ~17mo ago, lost insurance 11. History of tobacco alcohol use. Alcohol negative on admission. Provide  counseling 12. Eczema- might benefit from steroid creams  13.  HypoK mild add KCL 6meq po qd recheck 5/2 14. Urinary rentention: educated regarding importance of staying well hydrated/voiding regularly throughout the day- it appears to be normal for him at home to void once per day. He has been drinking a lot of fluid.   15. Insomnia: start melatonin 3mg  HS  16.  Hiccups - intractible , r/t cerebellar CVA, trial baclofen , nl GFR 17. Constipation- LBM 3 days ago- will order a dose of Sorbitol x1 and monitor   LOS: 9 days A FACE TO FACE EVALUATION WAS PERFORMED  Maisie Hauser 01/09/2021, 10:29 AM

## 2021-01-11 NOTE — Progress Notes (Signed)
PROGRESS NOTE   Subjective/Complaints:   amb in hall with OT . Pt has head turn toward the left and a left lean  ROS: Pt denies SOB, abd pain, CP, N/V/C/D, and vision changes    Objective:   No results found. No results for input(s): WBC, HGB, HCT, PLT in the last 72 hours. No results for input(s): NA, K, CL, CO2, GLUCOSE, BUN, CREATININE, CALCIUM in the last 72 hours.  Intake/Output Summary (Last 24 hours) at 01/11/2021 0831 Last data filed at 01/10/2021 1200 Gross per 24 hour  Intake 390 ml  Output --  Net 390 ml        Physical Exam: Vital Signs Blood pressure (!) 154/97, pulse 78, temperature 98.3 F (36.8 C), resp. rate 17, weight 90.2 kg, SpO2 96 %. No eructation  General: No acute distress Mood and affect are appropriate Heart: Regular rate and rhythm no rubs murmurs or extra sounds Lungs: Clear to auscultation, breathing unlabored, no rales or wheezes Abdomen: Positive bowel sounds, soft nontender to palpation, nondistended Extremities: No clubbing, cyanosis, or edema Skin: No evidence of breakdown, no evidence of rash   Neurologic: Cranial nerves II through XII intact, motor strength is 5/5 in bilateral deltoid, bicep, tricep, grip, hip flexor, knee extensors, ankle dorsiflexor and plantar flexor Sensory exam normal sensation to light touch and proprioception in bilateral upper and lower extremities Cerebellar exam normal finger to nose to finger as well as heel to shin in RIght  upper and lower extremities, mild/mod dysmetria left UE, normal heel shin LLE  Musculoskeletal: Full range of motion in all 4 extremities. No joint swelling   Assessment/Plan: 1. Functional deficits which require 3+ hours per day of interdisciplinary therapy in a comprehensive inpatient rehab setting.  Physiatrist is providing close team supervision and 24 hour management of active medical problems listed below.  Physiatrist  and rehab team continue to assess barriers to discharge/monitor patient progress toward functional and medical goals  Care Tool:  Bathing    Body parts bathed by patient: Right arm,Left arm,Chest,Abdomen,Front perineal area,Right upper leg,Left upper leg,Face,Right lower leg,Left lower leg     Body parts n/a: Buttocks   Bathing assist Assist Level: Supervision/Verbal cueing (sitting)     Upper Body Dressing/Undressing Upper body dressing   What is the patient wearing?: Pull over shirt    Upper body assist Assist Level: Supervision/Verbal cueing    Lower Body Dressing/Undressing Lower body dressing      What is the patient wearing?: Pants     Lower body assist Assist for lower body dressing: Minimal Assistance - Patient > 75%     Toileting Toileting    Toileting assist Assist for toileting: Supervision/Verbal cueing     Transfers Chair/bed transfer  Transfers assist     Chair/bed transfer assist level: Contact Guard/Touching assist     Locomotion Ambulation   Ambulation assist   Ambulation activity did not occur: Safety/medical concerns  Assist level: Minimal Assistance - Patient > 75% Assistive device: Walker-rolling Max distance: 150   Walk 10 feet activity   Assist     Assist level: Minimal Assistance - Patient > 75% Assistive device: Walker-rolling   Walk 50  feet activity   Assist Walk 50 feet with 2 turns activity did not occur: Safety/medical concerns  Assist level: Minimal Assistance - Patient > 75% Assistive device: Walker-rolling    Walk 150 feet activity   Assist Walk 150 feet activity did not occur: Safety/medical concerns  Assist level: Minimal Assistance - Patient > 75% Assistive device: Walker-rolling    Walk 10 feet on uneven surface  activity   Assist Walk 10 feet on uneven surfaces activity did not occur: Safety/medical concerns         Wheelchair     Assist Will patient use wheelchair at discharge?:  No             Wheelchair 50 feet with 2 turns activity    Assist            Wheelchair 150 feet activity     Assist          Blood pressure (!) 154/97, pulse 78, temperature 98.3 F (36.8 C), resp. rate 17, weight 90.2 kg, SpO2 96 %.  Medical Problem List and Plan: 1. 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 Impaired mobility -DVT/anticoagulation: Continue Lovenox -antiplatelet therapy: Plavix 75 mg daily and Pletal 100 mg twice daily x3 weeks then Plavix alone 3. Pain Management: Decrease Tramadol to q8H prn  5/7- reports pain controlled- con't regimen 4. Depression: Continue Wellbutrin 150 mg daily, Klonopin 1 mg daily, Effexor 75 mg daily, Ativan as needed -antipsychotic agents: N/A 5. Neuropsych: This patient is capable of making decisions on his own behalf. 6. Skin/Wound Care: Routine skin checks 7. Fluids/Electrolytes/Nutrition: Routine in and outs with follow-up chemistries 8. Hypertension. Continue Avapro 75 mg daily, Norvasc 10 mg daily. Monitor with increased mobility     Vitals:   12/31/20 2010 01/01/21 0423  BP: 128/89 135/85  Pulse: 98 100  Resp: 18 17  Temp: 98.4 F (36.9 C) 99 F (37.2 C)  SpO2: 97% 97%   BP controlled- 5/8 9. Hyperlipidemia. Continue Lipitor 10. Cervical dystonia. Patient has been followed in the past by Dr. Evelena Leyden and now Dr Gaye Alken in [facility] and has received Xeomin in the past Don't see much evidence of abnormal head positioning  at this time , but pt has intermittent head turns to left that contributes to LOB, last botulinum toxin injection ~12mo ago, which has worn off  11. History of tobacco alcohol use. Alcohol negative on admission.  Provide counseling 12. Eczema- might benefit from steroid creams  13.  HypoK mild add KCL 66meq po qd recheck 5/2 14. Urinary rentention: educated regarding importance of staying well hydrated/voiding regularly throughout the day- it appears to be normal for him at home to void once per day. He has been drinking a lot of fluid.   15. Insomnia: start melatonin 3mg  HS  16.  Hiccups - intractible , r/t cerebellar CVA, trial baclofen , nl GFR 17. Constipation- LBM 3 days ago- will order a dose of Sorbitol x1 and monitor  18.  Spasmodic torticollis , last botox per Dr Ellin Goodie 250U on 11/19/19 Left sternocleidomastoid 80 units Left splenius capitis 25 units Right splenius capitis 30 units Right levator 25 units Right OCI 30 units Right paraspinal muscles 20 units Left trapezius 15 units  Left levator scapula 25 units  LOS: 11 days A FACE TO FACE  EVALUATION WAS PERFORMED  Charlett Blake 01/11/2021, 8:31 AM

## 2021-01-11 NOTE — Progress Notes (Signed)
Physical Therapy Session Note  Patient Details  Name: Miguel [name] MRN: [id number] Date of Birth: 1956/02/14  Today's Date: 01/11/2021 PT Individual Time: 0900-0956 PT Individual Time Calculation (min): 56 min   Short Term Goals: Week 1:  PT Short Term Goal 1 (Week 1): Patient will complete bed <> wc transfers with LRAD and CGA PT Short Term Goal 1 - Progress (Week 1): Progressing toward goal PT Short Term Goal 2 (Week 1): Patient will ambulate >6ft with LRAD and MinA PT Short Term Goal 2 - Progress (Week 1): Met PT Short Term Goal 3 (Week 1): Patient will initiate stair training PT Short Term Goal 3 - Progress (Week 1): Met Week 2:  PT Short Term Goal 1 (Week 2): STG= LTG based on ELOS  Skilled Therapeutic Interventions/Progress Updates:   Received pt sitting EOB finishing OT session with MD present at bedside. Pt agreeable to therapy and denied any pain during session but reported increased dizziness and nausea. RN notified and present to administer medication. Pt reported 7/10 fatigue from previous OT session increasing to 8/10 at end of PT session. Provided pt with cool wash cloth and rest breaks to ease nausea and dizziness throughout session. Session with emphasis on functional mobility/transfers, generalized strengthening, dynamic standing balance/coordination, ambulation, and improved activity tolerance. Pt performed x 5 stand<>pivot transfers with RW and CGA throughout session with good safety awareness and AD management. Pt transported to therapy gym in [facility] total A for energy conservation purposes and ambulated 163ft with RW and CGA and 2 standing rest breaks. Pt demonstrated wide BOS with mild BLE ataxia but no LOB noted. Pt performed the following seated exercises with supervision and verbal cues for technique: -hip flexion 2x15 bilaterally with 3lb ankle weight  -LAQ 2x15 bilaterally with 3lb ankle weight  -horizontal chest press at 90 degrees with 5lb dowel 2x10 -overhead chest  press with 5lb dowel 2x10 -trunk rotation with unweighted ball x10 bilaterally -bicep curls with 6lb dumbbell 2x10 bilaterally  Pt transported back to room in [facility] total A and requested to return to bed. Sit<>supine with supervision. Concluded session with pt supine in bed, needs within reach, and bed alarm on.   Therapy Documentation Precautions:  Precautions Precautions: Fall,Other (comment) Precaution Comments: orthostatic hypotension which triggers "vertigo"--be extremely careful Restrictions Weight Bearing Restrictions: No RLE Weight Bearing: Weight bearing as tolerated LLE Weight Bearing: Weight bearing as tolerated Other Position/Activity Restrictions: left lean in standing as well as sitting sometimes  Therapy/Group: Individual Therapy Alfonse Alpers PT, DPT   01/11/2021, 7:30 AM

## 2021-01-11 NOTE — Progress Notes (Signed)
Occupational Therapy Session Note  Patient Details  Name: Miguel [name] MRN: [id number] Date of Birth: 04-10-1956  Today's Date: 01/11/2021 OT Individual Time: 0801-0902 OT Individual Time Calculation (min): 61 min    Short Term Goals: Week 2:  OT Short Term Goal 1 (Week 2): Continue working on established LTGs set at supervision level overall.  Skilled Therapeutic Interventions/Progress Updates:    Pt in bed eating breakfast to start session.  BP taken with HOB up at 113/76.  He was able to transfer to the EOB with supervision with BP again at 149/84, with only minimal dizziness.  In standing his BP was taken at 130/80 with min guard to complete standing with use of the RW for support.  He ambulated down to the therapy gym with min to min guard assist overall with use of the RW.  Had him work in quadriped to start for increased strengthening and weight bearing over the LUE.  Min assist to achieve quadriped with completion of 2 sets of 20 repetitions for modified pushup with min guard assist.  Then progressed to 2 sets of 2 reps each bird dog position, with pt needing min assist to maintain.  Finished with work on functional reaching for clothespins with the RUE while having to support his weight in quadriped with the LUE.  Rest breaks given between all tasks with pt resting in side sitting or tall kneeling.  Transition to the EOM where he then worked on LUE coordination and reaching to pick up clothespins at head level and placing on the horizontal bars.  He was able to remove them and then also place them up overhead to another target that therapist was holding.  Slight ataxia noted, but no significant difficulty seen.  Finished session with ambulation back to the room with the RW and min assist.  One LOB to the left noted requiring min assist when using the RW.  BP taken once sitting EOB at 166/99.  He was left sitting EOB with MD present.    Therapy Documentation Precautions:   Precautions Precautions: Fall,Other (comment) Precaution Comments: orthostatic hypotension which triggers "vertigo"--be extremely careful Restrictions Weight Bearing Restrictions: No RLE Weight Bearing: Weight bearing as tolerated LLE Weight Bearing: Weight bearing as tolerated Other Position/Activity Restrictions: left lean in standing as well as sitting sometimes  Pain: Pain Assessment Pain Scale: Faces Pain Score: 0-No pain ADL: See Care Tool Section for some details of mobility and selfcare  Therapy/Group: Individual Therapy  Mirjana Tarleton OTR/L 01/11/2021, 10:19 AM

## 2021-01-11 NOTE — Progress Notes (Signed)
Occupational Therapy Session Note  Patient Details  Name: Miguel [name] MRN: [id number] Date of Birth: 04-24-1956  Today's Date: 01/11/2021 OT Individual Time: 1033-1130 OT Individual Time Calculation (min): 57 min    Short Term Goals: Week 2:  OT Short Term Goal 1 (Week 2): Continue working on established LTGs set at supervision level overall.  Skilled Therapeutic Interventions/Progress Updates:    Treatment session with focus on functional mobility, dynamic standing balance, and LUE coordination.  Pt received supine in bed reporting fatigue but agreeable to therapy session.  Pt requested to attempt to ambulate to gym for continued practice. Pt ambulated ~30' with noted increased LLE ataxia, therefore pt transported remainder of distance to therapy gym.  Engaged in LUE fine and gross motor coordination tasks in standing on foam surface to further challenge balance.  Engaged in coordination with in-hand manipulation and translation with pegs and then coins to simulate placing coins in vending machine/parking meter.  Pt demonstrating increased motor control with dropping ~10% of pieces when increased to 4-5 pieces in hand.  Therapist incorporated cognitive challenge into task with use of scrabble letters for fine motor control and spelling words based on prompts.  CGA for standing balance on dynamic surface.  Pt returned to room and completed stand pivot transfer back to bed with RW and CGA.  Pt returned to supine and left with all needs in reach.  Therapy Documentation Precautions:  Precautions Precautions: Fall,Other (comment) Precaution Comments: orthostatic hypotension which triggers "vertigo"--be extremely careful Restrictions Weight Bearing Restrictions: No RLE Weight Bearing: Weight bearing as tolerated LLE Weight Bearing: Weight bearing as tolerated Other Position/Activity Restrictions: left lean in standing as well as sitting sometimes General:   Vital Signs: Therapy Vitals Temp: 98  F (36.7 C) Pulse Rate: 90 Resp: 16 BP: (!) 140/94 Patient Position (if appropriate): Lying Oxygen Therapy SpO2: 98 % O2 Device: Room Air Pain:  Pt with no c/o pain   Therapy/Group: Individual Therapy  Simonne Come 01/11/2021, 3:18 PM

## 2021-01-12 NOTE — Progress Notes (Signed)
Occupational Therapy Session Note  Patient Details  Name: Miguel [name] MRN: [id number] Date of Birth: [birth year]  Today's Date: 01/12/2021 OT Individual Time: 1004-1100 OT Individual Time Calculation (min): 56 min    Short Term Goals: Week 2:  OT Short Term Goal 1 (Week 2): Continue working on established LTGs set at supervision level overall.  Skilled Therapeutic Interventions/Progress Updates:    Session 1: ( 1004-1100)  Pt in bed to start with no reports of significant dizziness with supervision level transition to the EOB.  BP taken in sitting at 125/82 and then in standing at 120/83.  He was then able to use the RW and complete functional mobility down to the therapy gym with min assist for use of the UE ergonometer.  He completed 3 sets of 4 mins each with resistance on level 10.  The first set was completed using BUEs, while the last 2 were with isolated use of the LUE only, peddling 4 mins forward and then 4 mins backwards after a 1 minute rest break.  RPMs were maintained above 22 for all sets.  Next, he completed short distance mobility over to the therapy mat with min assist and no device.  Worked on standing balance and right weightshifts in standing using a 3-4" step.  Had him work on stepping onto and off of the step with the LUE as well as keeping his foot propped up on the step at times.  Increased weight shift to the left noted with overall mod assist needed to maintain balance during transitions.  Pt also with decreased LLE coordination when attempting to place the foot on the step as well as when trying to return to starting position.  Finished session with ambulation back to the room with overall min assist and use of the RW.  He was left sitting in the recliner with the call button and phone in reach and safety alarm in place.    Session 2: [phone])  Pt in bed to start, completed transfer to the EOB with supervision.  No significant dizziness reported during transitional  movement.  BP taken in standing at 122/70 before ambulating down to the therapy gym with min assist using the RW for support.  Once in the gym had him focus on dynamic standing balance while reaching down to the floor with both hands to pick up cones and place in therapist's hand.  Increased ataxia noted in the pelvis and LLE when flexing down to reach for the cones with min assist needed for balance.  Transitioned to having him ambulate in the therapy gym and out into the hall with use of the RW while incorporating head movements side to side.  Occasional LOB more to the left noted as speed increased.  After brief rest bread in sitting had him ambulate back to his room using the same method with the RW and incorporating small head turns to locate objects in the hallway that are red.  Pt tends to be limited with neck AROM and speed secondary to pre-existing torticollis.  Finished session with pt in the bed and with the call button and phone in reach.      Therapy Documentation Precautions:  Precautions Precautions: Fall,Other (comment) Precaution Comments: orthostatic hypotension which triggers "vertigo"--be extremely careful Restrictions Weight Bearing Restrictions: No RLE Weight Bearing: Weight bearing as tolerated LLE Weight Bearing: Weight bearing as tolerated Other Position/Activity Restrictions: left lean in standing as well as sitting sometimes  Pain: Pain Assessment Pain Scale: Faces Pain Score:  0-No pain ADL: See Care Tool Section for some details of mobility and selfcare  Therapy/Group: Individual Therapy  Quentavious Rittenhouse OTR/L 01/12/2021, 12:40 PM

## 2021-01-12 NOTE — Plan of Care (Signed)
  Problem: Consults Goal: RH STROKE PATIENT EDUCATION Description: See Patient Education module for education specifics  Outcome: Progressing   Problem: RH BOWEL ELIMINATION Goal: RH STG MANAGE BOWEL WITH ASSISTANCE Description: STG Manage Bowel with mod I Assistance. Outcome: Progressing Goal: RH STG MANAGE BOWEL W/MEDICATION W/ASSISTANCE Description: STG Manage Bowel with Medication with mod I  Assistance. Outcome: Progressing   Problem: RH SAFETY Goal: RH STG ADHERE TO SAFETY PRECAUTIONS W/ASSISTANCE/DEVICE Description: STG Adhere to Safety Precautions With cues/reminders Assistance/Device. Outcome: Progressing   Problem: RH PAIN MANAGEMENT Goal: RH STG PAIN MANAGED AT OR BELOW PT'S PAIN GOAL Description: At or below level 4 Outcome: Progressing   Problem: RH KNOWLEDGE DEFICIT Goal: RH STG INCREASE KNOWLEDGE OF DIABETES Description: Patient and spouse will be able to manage prediabetes with dietary modifications using handouts and educational materials independently. Outcome: Progressing Goal: RH STG INCREASE KNOWLEDGE OF HYPERTENSION Description: Patient and spouse will be able to manage HTN with medications and dietary modifications using handouts and educational materials independently. Outcome: Progressing Goal: RH STG INCREASE KNOWLEGDE OF HYPERLIPIDEMIA Description: Patient and spouse will be able to manage HLD with medications and dietary modifications using handouts and educational materials independently. Outcome: Progressing Goal: RH STG INCREASE KNOWLEDGE OF STROKE PROPHYLAXIS Description: Patient and spouse will be able to manage secondary stroke risks with medications and dietary modifications using handouts and educational materials independently. Outcome: Progressing   

## 2021-01-12 NOTE — Progress Notes (Signed)
PROGRESS NOTE   Subjective/Complaints:  Pt asking about family training   Also discussed botulinum toxin injections  ROS: Pt denies SOB, abd pain, CP, N/V/C/D, and vision changes    Objective:   No results found. No results for input(s): WBC, HGB, HCT, PLT in the last 72 hours. No results for input(s): NA, K, CL, CO2, GLUCOSE, BUN, CREATININE, CALCIUM in the last 72 hours.  Intake/Output Summary (Last 24 hours) at 01/12/2021 0749 Last data filed at 01/11/2021 1200 Gross per 24 hour  Intake 420 ml  Output --  Net 420 ml        Physical Exam: Vital Signs Blood pressure 128/82, pulse 72, temperature 98.3 F (36.8 C), resp. rate 14, weight 90.2 kg, SpO2 95 %. No eructation  General: No acute distress Mood and affect are appropriate Heart: Regular rate and rhythm no rubs murmurs or extra sounds Lungs: Clear to auscultation, breathing unlabored, no rales or wheezes Abdomen: Positive bowel sounds, soft nontender to palpation, nondistended Extremities: No clubbing, cyanosis, or edema Skin: No evidence of breakdown, no evidence of rash   Neurologic: Cranial nerves II through XII intact, motor strength is 5/5 in bilateral deltoid, bicep, tricep, grip, hip flexor, knee extensors, ankle dorsiflexor and plantar flexor Sensory exam normal sensation to light touch and proprioception in bilateral upper and lower extremities Cerebellar exam normal finger to nose to finger as well as heel to shin in RIght  upper and lower extremities, mild/mod dysmetria left UE, normal heel shin LLE  Musculoskeletal: Full range of motion in all 4 extremities. No joint swelling   Assessment/Plan: 1. Functional deficits which require 3+ hours per day of interdisciplinary therapy in a comprehensive inpatient rehab setting.  Physiatrist is providing close team supervision and 24 hour management of active medical problems listed below.  Physiatrist  and rehab team continue to assess barriers to discharge/monitor patient progress toward functional and medical goals  Care Tool:  Bathing    Body parts bathed by patient: Right arm,Left arm,Chest,Abdomen,Front perineal area,Right upper leg,Left upper leg,Face,Right lower leg,Left lower leg     Body parts n/a: Buttocks   Bathing assist Assist Level: Supervision/Verbal cueing (sitting)     Upper Body Dressing/Undressing Upper body dressing   What is the patient wearing?: Pull over shirt    Upper body assist Assist Level: Supervision/Verbal cueing    Lower Body Dressing/Undressing Lower body dressing      What is the patient wearing?: Pants     Lower body assist Assist for lower body dressing: Minimal Assistance - Patient > 75%     Toileting Toileting    Toileting assist Assist for toileting: Supervision/Verbal cueing     Transfers Chair/bed transfer  Transfers assist     Chair/bed transfer assist level: Contact Guard/Touching assist     Locomotion Ambulation   Ambulation assist   Ambulation activity did not occur: Safety/medical concerns  Assist level: Minimal Assistance - Patient > 75% Assistive device: Walker-rolling Max distance: 150'   Walk 10 feet activity   Assist     Assist level: Contact Guard/Touching assist Assistive device: Walker-rolling   Walk 50 feet activity   Assist Walk 50 feet with 2  turns activity did not occur: Safety/medical concerns  Assist level: Contact Guard/Touching assist Assistive device: Walker-rolling    Walk 150 feet activity   Assist Walk 150 feet activity did not occur: Safety/medical concerns  Assist level: Contact Guard/Touching assist Assistive device: Walker-rolling    Walk 10 feet on uneven surface  activity   Assist Walk 10 feet on uneven surfaces activity did not occur: Safety/medical concerns         Wheelchair     Assist Will patient use wheelchair at discharge?: No              Wheelchair 50 feet with 2 turns activity    Assist            Wheelchair 150 feet activity     Assist          Blood pressure 128/82, pulse 72, temperature 98.3 F (36.8 C), resp. rate 14, weight 90.2 kg, SpO2 95 %.  Medical Problem List and Plan: 1. 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 Impaired mobility -DVT/anticoagulation: Continue Lovenox -antiplatelet therapy: Plavix 75 mg daily and Pletal 100 mg twice daily x3 weeks then Plavix alone 3. Pain Management: Decrease Tramadol to q8H prn  5/7- reports pain controlled- con't regimen 4. Depression: Continue Wellbutrin 150 mg daily, Klonopin 1 mg daily, Effexor 75 mg daily, Ativan as needed -antipsychotic agents: N/A 5. Neuropsych: This patient is capable of making decisions on his own behalf. 6. Skin/Wound Care: Routine skin checks 7. Fluids/Electrolytes/Nutrition: Routine in and outs with follow-up chemistries 8. Hypertension. Continue Avapro 75 mg daily, Norvasc 10 mg daily. Monitor with increased mobility     Vitals:   12/31/20 2010 01/01/21 0423  BP: 128/89 135/85  Pulse: 98 100  Resp: 18 17  Temp: 98.4 F (36.9 C) 99 F (37.2 C)  SpO2: 97% 97%   BP controlled- 5/8 9. Hyperlipidemia. Continue Lipitor 10. Cervical dystonia. Patient has been followed in the past by Dr. Evelena Leyden and now Dr Gaye Alken in [facility] and has received Xeomin in the past Don't see much evidence of abnormal head positioning  at this time , but pt has intermittent head turns to left that contributes to LOB, last botulinum toxin injection ~39mo ago, which has worn off - will see if samples are available for injection   11. History of tobacco alcohol use. Alcohol negative on admission. Provide counseling 12. Eczema- might benefit from steroid creams  13.  HypoK mild add KCL 39meq po qd recheck 5/2 14. Urinary rentention: educated regarding importance of staying well hydrated/voiding regularly throughout the day- it appears to be normal for him at home to void once per day. He has been drinking a lot of fluid.   15. Insomnia: start melatonin 3mg  HS  16.  Hiccups - intractible , r/t cerebellar CVA, trial baclofen , nl GFR 17. Constipation- LBM 3 days ago- will order a dose of Sorbitol x1 and monitor  18.  Spasmodic torticollis , last botox per Dr Ellin Goodie 250U on 11/19/19 Left sternocleidomastoid 80 units Left splenius capitis 25 units Right splenius capitis 30 units Right levator 25 units Right OCI 30 units Right paraspinal muscles 20 units Left trapezius 15 units  Left levator scapula 25 units  LOS: 12 days A FACE TO FACE EVALUATION WAS PERFORMED  Luanna Salk Carron Jaggi 01/12/2021, 7:49 AM

## 2021-01-12 NOTE — Progress Notes (Signed)
Physical Therapy Session Note  Patient Details  Name: Miguel [name] MRN: [id number] Date of Birth: 1955/11/27  Today's Date: 01/12/2021 PT Individual Time: 1130-1200 PT Individual Time Calculation (min): 30 min   Short Term Goals: Week 1:  PT Short Term Goal 1 (Week 1): Patient will complete bed <> wc transfers with LRAD and CGA PT Short Term Goal 1 - Progress (Week 1): Progressing toward goal PT Short Term Goal 2 (Week 1): Patient will ambulate >37ft with LRAD and MinA PT Short Term Goal 2 - Progress (Week 1): Met PT Short Term Goal 3 (Week 1): Patient will initiate stair training PT Short Term Goal 3 - Progress (Week 1): Met Week 2:  PT Short Term Goal 1 (Week 2): STG= LTG based on ELOS Week 3:     Skilled Therapeutic Interventions/Progress Updates:    Pain:  Pt reports no pain.  Treatment to tolerance.  Rest breaks and repositioning as needed.  Pt initially oob in recliner and agreeable to treatment.   Sit to stand from recliner to RW w/cga.  Gait 26ft w/RW, ataxic advancement of LLE, cga, wide base w/crowding of walker on L, pt c/o dzzyness, second person provides wc for safety. BP134/89 Pt propels wc x 184ft w/bilat LEs. Stand pivot transfer wc to mat w/RW, cga  Repeated Sit to stand x 10 reps w/overhead lift of 4lb medball performed to increase BP and work on balance w/transition, cga. Repeated from low R to high L to attempt to encourage overall rotation to L x 10  BP 151/90  In semisquat w/focus on mantaining midline performed forward press x 10, overhead press x 10 then proceeded w/gait x 173ft w/RW, cga, wide base, ataxic progression LLE, increased bilat step length, no dizzyness, wc follow for safety.  Pt transferred to recliner w/cga.  Pt left oob in recliner w/chair alarm set and needs in reach.     Therapy Documentation Precautions:  Precautions Precautions: Fall,Other (comment) Precaution Comments: orthostatic hypotension which triggers "vertigo"--be extremely  careful Restrictions Weight Bearing Restrictions: No RLE Weight Bearing: Weight bearing as tolerated LLE Weight Bearing: Weight bearing as tolerated Other Position/Activity Restrictions: left lean in standing as well as sitting sometimes  Therapy/Group: Individual Therapy  Callie Fielding, [place] 01/12/2021, 12:21 PM

## 2021-01-12 NOTE — Plan of Care (Signed)
  Problem: RH Balance Goal: LTG Patient will maintain dynamic standing with ADLs (OT) Description: LTG:  Patient will maintain dynamic standing balance with assist during activities of daily living (OT)  Flowsheets (Taken 01/12/2021 1612) LTG: Pt will maintain dynamic standing balance during ADLs with: (goal downgraded based on progress) Contact Guard/Touching assist Note: goal downgraded based on progress   Problem: RH Simple Meal Prep Goal: LTG Patient will perform simple meal prep w/assist (OT) Description: LTG: Patient will perform simple meal prep with assistance, with/without cues (OT). Flowsheets (Taken 01/12/2021 1612) LTG: Pt will perform simple meal prep with assistance level of: (goal downgraded based on progress) Minimal Assistance - Patient > 75% Note: goal downgraded based on progress   Problem: RH Toilet Transfers Goal: LTG Patient will perform toilet transfers w/assist (OT) Description: LTG: Patient will perform toilet transfers with assist, with/without cues using equipment (OT) Flowsheets (Taken 01/12/2021 1612) LTG: Pt will perform toilet transfers with assistance level of: (goal downgraded based on progress) Contact Guard/Touching assist Note: goal downgraded based on progress   Problem: RH Tub/Shower Transfers Goal: LTG Patient will perform tub/shower transfers w/assist (OT) Description: LTG: Patient will perform tub/shower transfers with assist, with/without cues using equipment (OT) Flowsheets (Taken 01/12/2021 1612) LTG: Pt will perform tub/shower stall transfers with assistance level of: (goal downgraded based on progress) Contact Guard/Touching assist Note: goal downgraded based on progress

## 2021-01-12 NOTE — Progress Notes (Signed)
Physical Therapy Session Note  Patient Details  Name: Miguel [name] MRN: [id number] Date of Birth: [birth year]  Today's Date: 01/12/2021 PT Individual Time:  0800-0904  PT Individual Time Calculation (min): 64 min    Short Term Goals: Week 1:  PT Short Term Goal 1 (Week 1): Patient will complete bed <> wc transfers with LRAD and CGA PT Short Term Goal 1 - Progress (Week 1): Progressing toward goal PT Short Term Goal 2 (Week 1): Patient will ambulate >47ft with LRAD and MinA PT Short Term Goal 2 - Progress (Week 1): Met PT Short Term Goal 3 (Week 1): Patient will initiate stair training PT Short Term Goal 3 - Progress (Week 1): Met Week 2:  PT Short Term Goal 1 (Week 2): STG= LTG based on ELOS  Skilled Therapeutic Interventions/Progress Updates:  Patient longsitting in bed on entrance to room and attempting to insert contact lenses. Initially demos annoyance with PT presence while getting ready and requests to be alone. Pt given time to ready self and calm down prior to re-entrance to room.  Patient alert and agreeable to PT session. Patient denied pain during session.  Therapeutic Activity: Bed Mobility: Patient performed supine <> sit with Mod I using bedrail. No cues required for good technique and safety. Pt rises to sit EOB and performs self-assessment for dizziness/ pre-vertigo symptoms. At end of session is able to return to supine and pull covers with setup.  Transfers: Patient performed STS and SPVT transfers throughout session with CGA and improves to close supervision for final STS and SPVT. On first few rises to stand, pt uses bil knee extension into EOB for steadying into standing balance. Is able to self-correct and steps forward away from EOB for working on standing balance.   Gait Training:  Patient ambulated 150' x2 as well as short distances in therapy gym using RW and CGA with intermittent Min A during L turns for balance over NBOS. Majority of straight forward ambulation  bouts with CGA and short distances of supervision. Demonstrated much improved gait pattern and balance with improved weight shifting to each LE in stance phase of gait d/t use of mental cue of weight shifting required to perform ice skating. Provided vc/ tc for increased step height, forward gaze, and upright posture. Significant improvement in forward propulsion and maintaining balance over midline. Some difficulty noted with NBOS and near crossover stepping with L turns with 1-2 increased leans to L side requiring Min A to maintain balance. Similar but not as profound and maintains balance with R turns.  Pt guided in stair management training using BHR and pt performs reciprocating step pattern to ascend 4 steps with CGA, then descend 4 steps x2 with CGA and vc for progressing hand position on handrails. During initial descent of second bout of steps, pt states onset of vertigo symptoms and moves to sit on steps. PT halts pt and moves to provide knee for pt to sit on until symptoms alleviate. Symptoms alleviate quickly and pt able to descend rest of steps and ambulate with RW over 10 feet to reach w/c for pivot stepping and lowering to sit. All with CGA. BP taken in seated position with reading of 128/ 83 which is a good pressure for this pt.   Pt then guided in performance of 4" curb step with use of RW x2. After verbal instruction and visual demonstration, pt is able to complete with good technique and CGA.  Patient supine in bed at end of session with  brakes locked, bed alarm set, and all needs within reach.   Therapy Documentation Precautions:  Precautions Precautions: Fall,Other (comment) Precaution Comments: orthostatic hypotension which triggers "vertigo"--be extremely careful Restrictions Weight Bearing Restrictions: No RLE Weight Bearing: Weight bearing as tolerated LLE Weight Bearing: Weight bearing as tolerated Other Position/Activity Restrictions: left lean in standing as well as  sitting sometimes Vital Signs: (prior to session) Therapy Vitals Temp: 98.3 F (36.8 C) Pulse Rate: 72 Resp: 14 BP: 128/82 Patient Position (if appropriate): Lying Oxygen Therapy SpO2: 95 % O2 Device: Room Air  Therapy/Group: Individual Therapy  Alger Simons PT, DPT 01/12/2021, 7:59 AM

## 2021-01-12 NOTE — Progress Notes (Addendum)
Patient ID: Miguel [name], male   DOB: June 05, 1956, 65 y.o.   MRN: [id number]   Family education scheduled Wednesday, Jan 13, 2021 [place], [place]

## 2021-01-13 ENCOUNTER — Other Ambulatory Visit (HOSPITAL_COMMUNITY): Payer: Self-pay

## 2021-01-13 MED ORDER — MELATONIN 3 MG PO TABS
3.0000 mg | ORAL_TABLET | Freq: Every day | ORAL | 0 refills | Status: DC
  Filled 2021-01-13: qty 30, 30d supply, fill #0

## 2021-01-13 MED ORDER — ATORVASTATIN CALCIUM 80 MG PO TABS
80.0000 mg | ORAL_TABLET | Freq: Every day | ORAL | 0 refills | Status: DC
  Filled 2021-01-13: qty 30, 30d supply, fill #0

## 2021-01-13 MED ORDER — CALCIUM CARBONATE ANTACID 500 MG PO CHEW: 1.0000 | CHEWABLE_TABLET | ORAL | Status: AC | PRN

## 2021-01-13 MED ORDER — TRAMADOL HCL 50 MG PO TABS
50.0000 mg | ORAL_TABLET | Freq: Three times a day (TID) | ORAL | 0 refills | Status: DC | PRN
  Filled 2021-01-13: qty 21, 7d supply, fill #0
  Filled 2021-02-08: qty 9, 3d supply, fill #1

## 2021-01-13 MED ORDER — VITAMIN D 50 MCG (2000 UT) PO CAPS
2000.0000 [IU] | ORAL_CAPSULE | Freq: Every day | ORAL | 0 refills | Status: AC
  Filled 2021-01-13: qty 30, 30d supply, fill #0

## 2021-01-13 MED ORDER — BUPROPION HCL ER (SR) 150 MG PO TB12
ORAL_TABLET | ORAL | 3 refills | Status: AC
  Filled 2021-01-13 – 2021-03-18 (×2): qty 90, 90d supply, fill #0
  Filled 2021-06-01: qty 90, 90d supply, fill #1
  Filled 2021-06-02: qty 90, 90d supply, fill #0
  Filled 2021-07-21: qty 90, 90d supply, fill #1

## 2021-01-13 MED ORDER — CLONAZEPAM 1 MG PO TABS
ORAL_TABLET | Freq: Every day | ORAL | 1 refills | Status: AC
  Filled 2021-01-13 – 2021-03-18 (×2): qty 90, 90d supply, fill #0

## 2021-01-13 MED ORDER — ESOMEPRAZOLE MAGNESIUM 40 MG PO CPDR
40.0000 mg | DELAYED_RELEASE_CAPSULE | ORAL | 0 refills | Status: DC | PRN
  Filled 2021-01-13: qty 30, 30d supply, fill #0

## 2021-01-13 MED ORDER — TAMSULOSIN HCL 0.4 MG PO CAPS
0.4000 mg | ORAL_CAPSULE | Freq: Every day | ORAL | 0 refills | Status: DC
  Filled 2021-01-13: qty 30, 30d supply, fill #0

## 2021-01-13 MED ORDER — VENLAFAXINE HCL ER 75 MG PO CP24
ORAL_CAPSULE | Freq: Every day | ORAL | 3 refills | Status: AC
  Filled 2021-01-13 – 2021-03-18 (×2): qty 90, 90d supply, fill #0
  Filled 2021-06-01: qty 90, 90d supply, fill #1
  Filled 2021-06-02: qty 90, 90d supply, fill #0
  Filled 2021-07-20 – 2021-07-21 (×3): qty 90, 90d supply, fill #1

## 2021-01-13 MED ORDER — MECLIZINE HCL 25 MG PO TABS
25.0000 mg | ORAL_TABLET | Freq: Two times a day (BID) | ORAL | 0 refills | Status: DC | PRN
  Filled 2021-01-13: qty 30, 15d supply, fill #0

## 2021-01-13 MED ORDER — BACLOFEN 10 MG PO TABS
10.0000 mg | ORAL_TABLET | Freq: Three times a day (TID) | ORAL | 0 refills | Status: DC | PRN
  Filled 2021-01-13: qty 30, 10d supply, fill #0

## 2021-01-13 MED ORDER — POLYETHYLENE GLYCOL 3350 17 G PO PACK: 17.0000 g | PACK | Freq: Two times a day (BID) | ORAL | 0 refills | Status: DC

## 2021-01-13 MED ORDER — CLOPIDOGREL BISULFATE 75 MG PO TABS
75.0000 mg | ORAL_TABLET | Freq: Every day | ORAL | 0 refills | Status: DC
  Filled 2021-01-13: qty 30, 30d supply, fill #0

## 2021-01-13 MED ORDER — BACLOFEN 10 MG PO TABS: 10.0000 mg | ORAL_TABLET | Freq: Three times a day (TID) | ORAL | Status: DC | PRN

## 2021-01-13 NOTE — Progress Notes (Signed)
PROGRESS NOTE   Subjective/Complaints:  General: No acute distress Mood and affect are appropriate Heart: Regular rate and rhythm no rubs murmurs or extra sounds Lungs: Clear to auscultation, breathing unlabored, no rales or wheezes Abdomen: Positive bowel sounds, soft nontender to palpation, nondistended Extremities: No clubbing, cyanosis, or edema Skin: No evidence of breakdown, no evidence of rash  ROS: Pt denies SOB, abd pain, CP, N/V/C/D, and vision changes  Neuro:  Eyes without evidence of nystagmus  Tone is normal without evidence of spasticity Cerebellar exam shows mild dysmetria left on finger nose finger or heel to shin testing No evidence of trunkal ataxia  Motor strength is 5/5 in bilateral deltoid, biceps, triceps, finger flexors and extensors, wrist flexors and extensors, hip flexors, knee flexors and extensors, ankle dorsiflexors, plantar flexors, invertors and evertors, toe flexors and extensors  Sensory exam is normal to proprioception and light touch in the upper and lower limbs       Objective:   No results found. No results for input(s): WBC, HGB, HCT, PLT in the last 72 hours. No results for input(s): NA, K, CL, CO2, GLUCOSE, BUN, CREATININE, CALCIUM in the last 72 hours.  Intake/Output Summary (Last 24 hours) at 01/13/2021 0812 Last data filed at 01/12/2021 1700 Gross per 24 hour  Intake 400 ml  Output --  Net 400 ml        Physical Exam: Vital Signs Blood pressure (!) 141/86, pulse 72, temperature 97.9 F (36.6 C), resp. rate 16, weight 90.2 kg, SpO2 98 %. No eructation  General: No acute distress Mood and affect are appropriate Heart: Regular rate and rhythm no rubs murmurs or extra sounds Lungs: Clear to auscultation, breathing unlabored, no rales or wheezes Abdomen: Positive bowel sounds, soft nontender to palpation, nondistended Extremities: No clubbing, cyanosis, or edema Skin:  No evidence of breakdown, no evidence of rash   Neurologic: Cranial nerves II through XII intact, motor strength is 5/5 in bilateral deltoid, bicep, tricep, grip, hip flexor, knee extensors, ankle dorsiflexor and plantar flexor Sensory exam normal sensation to light touch and proprioception in bilateral upper and lower extremities Cerebellar exam normal finger to nose to finger as well as heel to shin in RIght  upper and lower extremities, mild/mod dysmetria left UE, normal heel shin LLE  Musculoskeletal: Full range of motion in all 4 extremities. No joint swelling   Assessment/Plan: 1. Functional deficits which require 3+ hours per day of interdisciplinary therapy in a comprehensive inpatient rehab setting.  Physiatrist is providing close team supervision and 24 hour management of active medical problems listed below.  Physiatrist and rehab team continue to assess barriers to discharge/monitor patient progress toward functional and medical goals  Care Tool:  Bathing    Body parts bathed by patient: Right arm,Left arm,Chest,Abdomen,Front perineal area,Right upper leg,Left upper leg,Face,Right lower leg,Left lower leg     Body parts n/a: Buttocks   Bathing assist Assist Level: Supervision/Verbal cueing (sitting)     Upper Body Dressing/Undressing Upper body dressing   What is the patient wearing?: Pull over shirt    Upper body assist Assist Level: Supervision/Verbal cueing    Lower Body Dressing/Undressing Lower body dressing  What is the patient wearing?: Pants     Lower body assist Assist for lower body dressing: Minimal Assistance - Patient > 75%     Toileting Toileting    Toileting assist Assist for toileting: Supervision/Verbal cueing     Transfers Chair/bed transfer  Transfers assist     Chair/bed transfer assist level: Contact Guard/Touching assist (RW)     Locomotion Ambulation   Ambulation assist   Ambulation activity did not occur:  Safety/medical concerns  Assist level: Minimal Assistance - Patient > 75% Assistive device: Walker-rolling Max distance: 60'   Walk 10 feet activity   Assist     Assist level: Supervision/Verbal cueing (very close supervision for in-room distances) Assistive device: Walker-rolling   Walk 50 feet activity   Assist Walk 50 feet with 2 turns activity did not occur: Safety/medical concerns  Assist level: Minimal Assistance - Patient > 75% (turns to L require Min A; turns to R with Sup/ CGA) Assistive device: Walker-rolling    Walk 150 feet activity   Assist Walk 150 feet activity did not occur: Safety/medical concerns  Assist level: Contact Guard/Touching assist Assistive device: Walker-rolling    Walk 10 feet on uneven surface  activity   Assist Walk 10 feet on uneven surfaces activity did not occur: Safety/medical concerns         Wheelchair     Assist Will patient use wheelchair at discharge?: No             Wheelchair 50 feet with 2 turns activity    Assist            Wheelchair 150 feet activity     Assist          Blood pressure (!) 141/86, pulse 72, temperature 97.9 F (36.6 C), resp. rate 16, weight 90.2 kg, SpO2 98 %.  Medical Problem List and Plan: 1. 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 May be able to move up to 5/12  -con't PT and OT- needs family training 2. Impaired mobility -DVT/anticoagulation: Continue Lovenox -antiplatelet therapy: Plavix 75 mg daily and Pletal 100 mg twice daily x3 weeks then Plavix alone 3. Pain Management: Decrease Tramadol to q8H prn  5/7- reports pain controlled- con't regimen 4. Depression: Continue Wellbutrin 150 mg daily, Klonopin 1 mg daily, Effexor 75 mg daily, Ativan as needed -antipsychotic  agents: N/A 5. Neuropsych: This patient is capable of making decisions on his own behalf. 6. Skin/Wound Care: Routine skin checks 7. Fluids/Electrolytes/Nutrition: Routine in and outs with follow-up chemistries 8. Hypertension. Continue Avapro 75 mg daily, Norvasc 10 mg daily. Monitor with increased mobility     Vitals:   12/31/20 2010 01/01/21 0423  BP: 128/89 135/85  Pulse: 98 100  Resp: 18 17  Temp: 98.4 F (36.9 C) 99 F (37.2 C)  SpO2: 97% 97%   BP controlled- 5/8 9. Hyperlipidemia. Continue Lipitor 10. Cervical dystonia. Patient has been followed in the past by Dr. Evelena Leyden and now Dr Gaye Alken in [facility] and has received Xeomin in the past Don't see much evidence of abnormal head positioning  at this time , but pt has intermittent head turns to left that contributes to LOB, last botulinum toxin injection ~31mo ago, which has worn off - will see if samples are available for injection  11. History of tobacco alcohol use. Alcohol negative on admission. Provide counseling 12.  Eczema- might benefit from steroid creams  13.  HypoK mild add KCL 7meq po qd recheck 5/2 14. Urinary rentention: educated regarding importance of staying well hydrated/voiding regularly throughout the day- it appears to be normal for him at home to void once per day. He has been drinking a lot of fluid.   15. Insomnia: start melatonin 3mg  HS  16.  Hiccups - intractible , r/t cerebellar CVA, trial baclofen , nl GFR 17. Constipation- LBM 3 days ago- will order a dose of Sorbitol x1 and monitor  18.  Spasmodic torticollis , last botox per Dr Ellin Goodie 250U on 11/19/19- will repeat today  Left sternocleidomastoid 80 units Left splenius capitis 25 units Right splenius capitis 30 units Right levator 25 units Right OCI 30 units Right paraspinal muscles 20 units Left trapezius 15 units  Left levator scapula 25 units  Discussed pt's job description , needs to frequently  lift 20lb and occ 50lb, needs to bend stoop , crawl, none of which the pt is able to do now or in the future, still walking with a walker unable to lift or carry at all.   LOS: 13 days A FACE TO FACE EVALUATION WAS PERFORMED  Charlett Blake 01/13/2021, 8:12 AM

## 2021-01-13 NOTE — Patient Care Conference (Signed)
Inpatient RehabilitationTeam Conference and Plan of Care Update Date: 01/13/2021   Time: 10:30 AM    Patient Name: Miguel [name]      Medical Record Number: [id number]  Date of Birth: 1956/06/10 Sex: Male         Room/Bed: 4M06C/4M06C-01 Payor Info: Payor: [place] / Plan: [employer]BS COMM PPO / Product Type: *No Product type* /    Admit Date/Time:  12/31/2020  2:13 PM  Primary Diagnosis:  Cerebrovascular accident (CVA) of right thalamus [facility])  Hospital Problems: Principal Problem:   Cerebrovascular accident (CVA) of right thalamus ([place]) Active Problems:   Cerebral infarction due to embolism of left cerebellar artery [facility])    Expected Discharge Date: Expected Discharge Date: 01/14/21  Team Members Present: Physician leading conference: Dr. Alysia Penna Care Coodinator Present: Dorien Chihuahua, RN, BSN, CRRN;Christina Fulton, BSW Nurse Present: Dorien Chihuahua, RN PT Present: Estevan Ryder, PT OT Present: Clyda Greener, OT SLP Present: Other (comment) Gunnar Fusi, SLP) PPS Coordinator present : Gunnar Fusi, SLP     Current Status/Progress Goal Weekly Team Focus  Bowel/Bladder   Pt continent B/B LBM 5/10  continued continence. BM every three days or less  Assess B/B q shift   Swallow/Nutrition/ Hydration             ADL's   Supervision for UB selfcare with min guard assist for LB selfcare.  Min assist for transfers with the RW to the walk-in shower and the toilet.  Slight ataxia as well in the LUE but more in the LLE  supervision overall  selfcare retraining, balance retraining, DME education, neuromuscular re-education, vestibular retraining, therapeutic activites   Mobility   ind bed mob, CGA/SPV STS, stand pivot with RW, CGA/MiNA gait up to 210ft, CGA/MinA 4 stairs R HR  supervision overall at ambulatory level  dc planning, fam ed, dynamic/ambulatory balance   Communication             Safety/Cognition/ Behavioral Observations  patient discharged from  services  discharged at a supervision level per patient's wishes  n/a   Pain   Pt denies pain  Pain level <3/10  assess pain q shift and PRN   Skin   Skin intact, brusing to BUE  no new breakdown  Assess skin q shift and PRN     Discharge Planning:  pt d/c home with spouse. 2level home, 1/2 half bathroom on main and 1 step to enter   Team Discussion: Cerebral/right thalamic infarct with cervical dystonia/neck tremors impairing progress. MD plans injection for dystonia since no treatment for pst year. BP stable, Continue to note lightheadedness at times with left lean without loss of balance. Ataxia of left lower extremity has improved however continue to inconsistenly note  Nystagmus with vestibular episodes.   Patient on target to meet rehab goals: yes, currently Min guard overall. Able to ambulate and manage steps with CGA.  *See Care Plan and progress notes for long and short-term goals.   Revisions to Treatment Plan:  SLP discontinued  Teaching Needs: Family education completed 01/13/21; address steps, ambulation, safety, medication management, secondary stroke risk management, etc.  Current Barriers to Discharge: Decreased caregiver support, Home enviroment access/layout and dystonia  Possible Resolutions to Barriers: Family education OP follow up recommended however better served with [facility] initially     Medical Summary Current Status: Hiccups improved, spasms improved, continues with ataxia on the left side, cervical dystonia affecting gait  Barriers to Discharge: Medical stability   Possible Resolutions to [employer]  Focus: Botulinum toxin injection for cervical dystonia   Continued Need for Acute Rehabilitation Level of Care: The patient requires daily medical management by a physician with specialized training in physical medicine and rehabilitation for the following reasons: Direction of a multidisciplinary physical rehabilitation program to maximize functional  independence : Yes Medical management of patient stability for increased activity during participation in an intensive rehabilitation regime.: Yes Analysis of laboratory values and/or radiology reports with any subsequent need for medication adjustment and/or medical intervention. : Yes   I attest that I was present, lead the team conference, and concur with the assessment and plan of the team.   Dorien Chihuahua B 01/13/2021, 1:22 PM

## 2021-01-13 NOTE — Progress Notes (Signed)
Physical Therapy Discharge Summary  Patient Details  Name: Miguel [name] MRN: [id number] Date of Birth: [birth year]  Today's Date: 01/13/2021 PT Individual Time: 0900-0940 PT Individual Time Calculation (min): 40 min   Session 1:  Patient received sitting up in bed, agreeable to PT. He denies pain. He was able to come sit edge of bed independently. BP sitting: 120/83 (93) 83BPM. Patient donning sneakers with supervision. BP in standing with no abdominal binder: 124/78 (89) 93BPM. Patient sitting and donning abdominal binder. BP in standing with abdominal binder: 108/84. Patient without reports of dizziness. He was agreeable to keep abdominal binder on and ambulate to therapy gym with RW and CGA. PT bringing wc with for safety. Mild L ataxia noted with WBOS maintained. Slow, intentional gait maintained as well. No "episodes" with gait to gym. Patient completing d/c assessment as noted below. He was able to negotiate 4 steps with R HR, self-selecting reciprocal stepping. Once at the top platform, patient reporting that when he previous did stairs he got dizzy with the turn. PT advising slow turn. Even with slow turning, once patient was facing stairs again, he demonstrated L horizontal nystagmus that fatigued in ~20s. Patient able to safely prop against handrails until nystagmus fatigued before descending stairs. Once seated at bottom of stairs, BP: 111/84 (93) 88 BPM with abdominal binder on. Patient selecting to roll in wc back to room due to lower BP reading. Once in room, patient ambulating to bathroom with RW and CGA. Remaining in bathroom, pull string within reach, NT aware of patients location.    Session 2: Patient received sitting on edge of bed, wife at bedside, agreeable to fam ed. He denies pain. PT educating wife on how to guard patient appropriately for sit <> stand and stand pivot transfers using RW. PT emphasizing recommendation for follow up OP PT due to intensity and focus on vestibular  rehab. Patient eventually in agreement. PT also emphasizing need for 24/7 supervision/assist, especially when first d/c'd home. Patient ambulating to therapy gym with RW and CGA with PT assist so wife could observe gait pattern. PT educating wife to remain on patients L side and communicate to patient if gait appears to be breaking down to allow patient to rest before continuing on. PT discussed use of transport chair in community if wife and patient deem necessary for drs appts, etc. He was able to negotiate 1 large 8" curb with RW and CGA and negotiate 4 steps with R HR and CGA provided by wife. Discussed with wife different movements that patient has found to provoke vestibular symptoms and how to avoid them/accommodate for them. PT educating patient and wife on fall procedure and if he were to hit his head/have obvious injury to call 911/be evaluated by dr. He was able to complete floor transfer with verbal cues for process. Patient transferring in/out of car with CGA. He was able to ambulate back to his room with wife providing CGA and RW. Small LOB to the L when looking over R shoulder, but patient and wife able to correct without PT intervention. Patient completing BBS scoring a 23/56 indicating increased risk of falling and benefit from use of AD. Patient completing lateral stepping with RW and CGA to simulate small bathroom transfer. Patient returning supine in bed with supervision. Patient and family without further questions. Bed alarm on, call light within reach.   Patient has met 5 of 9 long term goals due to improved activity tolerance, improved balance, improved postural control, increased strength,  ability to compensate for deficits, functional use of  left upper extremity and left lower extremity, improved attention, improved awareness and improved coordination.  Patient to discharge at an ambulatory level CGA.   Patient's care partner is independent to provide the necessary physical assistance at  discharge.  Reasons goals not met: Patient with unexpected d/c date move up not allowing PT to appropriate adjust goals. He is grossly CGA with RW for all transfers and gait due to L LE ataxia and intermittent BP fluctuations resulting in "vertigo."  Recommendation:  Patient will benefit from ongoing skilled PT services in outpatient setting to continue to advance safe functional mobility, address ongoing impairments in coordination, activity tolerance, stairs, gait progressions, dynamic/ambulatory balance, and minimize fall risk.  Equipment: RW  Reasons for discharge: treatment goals met and discharge from hospital  Patient/family agrees with progress made and goals achieved: Yes  PT Discharge Precautions/Restrictions Precautions Precautions: Fall;Other (comment) Precaution Comments: orthostatic hypotension which triggers "vertigo"--be extremely careful Restrictions Weight Bearing Restrictions: No RLE Weight Bearing: Weight bearing as tolerated LLE Weight Bearing: Weight bearing as tolerated Other Position/Activity Restrictions: left lean in standing as well as sitting sometimes; patient reports this is due to h/o cervical dystonia Vision/Perception  Vision - Assessment Eye Alignment: Within Functional Limits Additional Comments: variable nystagmus when BP drops Perception Perception: Within Functional Limits Praxis Praxis: Intact  Cognition Overall Cognitive Status: Within Functional Limits for tasks assessed Arousal/Alertness: Awake/alert Orientation Level: Oriented X4 Sustained Attention: Appears intact Selective Attention: Appears intact Memory: Appears intact Awareness: Impaired Problem Solving: Impaired Organizing: Appears intact Self Monitoring: Appears intact Safety/Judgment: Appears intact Sensation Sensation Light Touch: Appears Intact Hot/Cold: Appears Intact Proprioception: Appears Intact Stereognosis: Appears Intact Coordination Gross Motor Movements  are Fluid and Coordinated: No Fine Motor Movements are Fluid and Coordinated: No Coordination and Movement Description: Slight ataxia and decreased speed noted with finger to nose testing with the LUE Heel Shin Test: very mildly dysmetric L LE Motor  Motor Motor: Abnormal postural alignment and control Motor - Discharge Observations: mild L ataxia, slight L lateral lean in sitting/standing (patient reports d/t h/o of cervical dystonia  Mobility Bed Mobility Bed Mobility: Supine to Sit;Sit to Supine;Rolling Right;Rolling Left Rolling Right: Independent Rolling Left: Independent Supine to Sit: Independent Sit to Supine: Independent Transfers Transfers: Sit to Stand;Stand to Sit;Stand Pivot Transfers Sit to Stand: Supervision/Verbal cueing Stand to Sit: Supervision/Verbal cueing Stand Pivot Transfers: Supervision/Verbal cueing Stand Pivot Transfer Details: Verbal cues for precautions/safety;Verbal cues for safe use of DME/AE Transfer (Assistive device): Rolling walker Locomotion  Gait Ambulation: Yes Gait Assistance: Contact Guard/Touching assist Gait Distance (Feet): 200 Feet Assistive device: Rolling walker Gait Assistance Details: Verbal cues for safe use of DME/AE;Verbal cues for precautions/safety Gait Gait: Yes Gait Pattern: Impaired Gait Pattern: Step-to pattern;Step-through pattern;Ataxic;Trunk flexed;Lateral trunk lean to left Gait velocity: decreased Stairs / Additional Locomotion Stairs: Yes Stairs Assistance: Contact Guard/Touching assist Stair Management Technique: One rail Right Number of Stairs: 4 Height of Stairs: 6 Ramp: Contact Guard/touching assist Curb: Contact Guard/Touching assist Wheelchair Mobility Wheelchair Mobility: No  Trunk/Postural Assessment  Cervical Assessment Cervical Assessment: Exceptions to [facility] (forward head, h/o dystonia) Thoracic Assessment Thoracic Assessment: Exceptions to [facility] (increased kyphosis/ L lateral lean) Lumbar  Assessment Lumbar Assessment: Exceptions to [facility] (posterior pelvic tilt) Postural Control Postural Control: Deficits on evaluation Righting Reactions: delayed and inadequate Protective Responses: delayed and inadequate  Balance Balance Balance Assessed: Yes Standardized Balance Assessment Standardized Balance Assessment: Berg Balance Test Berg Balance Test Sit to Stand: Able  to stand without using hands and stabilize independently Standing Unsupported: Able to stand 2 minutes with supervision Sitting with Back Unsupported but Feet Supported on Floor or Stool: Able to sit safely and securely 2 minutes Stand to Sit: Sits safely with minimal use of hands Transfers: Needs one person to assist Standing Unsupported with Eyes Closed: Unable to keep eyes closed 3 seconds but stays steady Standing Ubsupported with Feet Together: Needs help to attain position and unable to hold for 15 seconds From Standing, Reach Forward with Outstretched Arm: Can reach forward >5 cm safely (2") From Standing Position, Pick up Object from Floor: Unable to try/needs assist to keep balance From Standing Position, Turn to Look Behind Over each Shoulder: Turn sideways only but maintains balance Turn 360 Degrees: Needs assistance while turning Standing Unsupported, Alternately Place Feet on Step/Stool: Needs assistance to keep from falling or unable to try Standing Unsupported, One Foot in Front: Needs help to step but can hold 15 seconds Standing on One Leg: Tries to lift leg/unable to hold 3 seconds but remains standing independently Total Score: 23 Static Sitting Balance Static Sitting - Balance Support: Feet supported Static Sitting - Level of Assistance: 6: Modified independent (Device/Increase time) Dynamic Sitting Balance Dynamic Sitting - Balance Support: During functional activity Dynamic Sitting - Level of Assistance: 5: Stand by assistance Static Standing Balance Static Standing - Balance Support:  Bilateral upper extremity supported Static Standing - Level of Assistance: 5: Stand by assistance Dynamic Standing Balance Dynamic Standing - Balance Support: During functional activity Dynamic Standing - Level of Assistance:  (CGA) Extremity Assessment      RLE Assessment RLE Assessment: Exceptions to [facility] General Strength Comments: grossly 5/5 with mild incoordination LLE Assessment LLE Assessment: Exceptions to [facility] General Strength Comments: grossly 5/5 with mild incoordination L >R    Debbora Dus 01/13/2021, 10:33 AM

## 2021-01-13 NOTE — Progress Notes (Signed)
Occupational Therapy Discharge Summary  Patient Details  Name: Miguel [name] MRN: [id number] Date of Birth: [birth year]  Today's Date: 01/13/2021 OT Individual Time: 5945-8592 OT Individual Time Calculation (min): 77 min   Session Note:  Pt's spouse in for family education this session.   BP 128/86 in standing without pt reporting symptoms.  He was able to donn his shoes with setup assist on the EOB to start.  Had him complete toilet transfer with use of the RW and min guard assist.  Instructed her to stay on the left side of pt secondary to increased lean and occasional LOB to the left.  She voiced understanding.  Discussed and educated both on walk-in shower transfers with positioning of the tub bench in the shower.  He will complete showers once he is able to negotiate steps to the second level but will start with sponge bathing only.  Next, educated pt and spouse on LUE therex exercises with use of the orange and green theraband (medium to heavy resistance). He was able to return demonstrate completion of shoulder flexion, elbow flexion, and elbow extension.  Weightbearing activities in quadriped were also given to increase strength and decreased ataxia in the LUE.  He did not practice them this session, but was familiar with them from previous sessions.   Also educated on vestibular exercises to be completed daily as well as FM coordination activies.  Handouts provided for all exercises.  Pt was left sitting EOB with spouse in room and awaiting PT session.  All questions answered.      Patient has met 11 of 11 long term goals due to improved activity tolerance, improved balance, postural control, ability to compensate for deficits, functional use of  LEFT upper extremity and improved coordination.  Patient to discharge at overall Supervision level.  Patient's care partner is independent to provide the necessary physical assistance at discharge.    Reasons goals not met: NA  Recommendation:  Patient  will benefit from ongoing skilled OT services in outpatient setting to continue to advance functional skills in the area of BADL, iADL and Reduce care partner burden.  Pt continues to demonstrate decreased coordination in the LUE and LLE as well as increased ataxia and central vestibular deficits.  In addition, he continues to need min guard to min assist for dynamic standing balance tasks related to ADL function with use of appropriate DME.  Feel he will benefit from continued outpatient level therapy to progress back to independent level for all selfcare tasks and IADLs.    Equipment: No equipment provided  Reasons for discharge: treatment goals met and discharge from hospital  Patient/family agrees with progress made and goals achieved: Yes  OT Discharge Precautions/Restrictions  Precautions Precautions: Fall;Other (comment) Precaution Comments: orthostatic hypotension which triggers "vertigo"--be extremely careful Restrictions Weight Bearing Restrictions: No  Pain Pain Assessment Pain Scale: Faces Pain Score: 0-No pain ADL ADL Eating: Independent Where Assessed-Eating: Edge of bed Grooming: Supervision/safety Where Assessed-Grooming: Standing at sink Upper Body Bathing: Setup Where Assessed-Upper Body Bathing: Chair,Shower Lower Body Bathing: Supervision/safety Where Assessed-Lower Body Bathing: Chair,Shower Upper Body Dressing: Setup Where Assessed-Upper Body Dressing: Edge of bed Lower Body Dressing: Supervision/safety Where Assessed-Lower Body Dressing: Edge of bed Toileting: Supervision/safety Where Assessed-Toileting: Glass blower/designer: Therapist, music Method: Counselling psychologist: Raised toilet Scientist, research (medical): Not assessed Social research officer, government: Curator Method: Heritage manager: Gaffer Baseline Vision/History:  (wears contacts) Wears Glasses: At  all  times Patient Visual Report: No change from baseline Vision Assessment?: Yes Eye Alignment: Within Functional Limits Ocular Range of Motion: Within Functional Limits Alignment/Gaze Preference: Within Defined Limits Tracking/Visual Pursuits: Able to track stimulus in all quads without difficulty Saccades: Within functional limits Convergence: Within functional limits Visual Fields: No apparent deficits Additional Comments: Pt with horizontal nystagmus randomly at times with postional changes which lasts for less than 10 seconds and resolves.  Unable to reproduce with positional changes and head movements in therapy.  He does exhibit catch up saccaddes with VOR cancellation as well as when turning his head from left to right when looking at targets from one side of the room to the other.  No signifcant report of dizziness overall and it it does not worsen with head movements or head and eye movements together. Perception  Perception: Within Functional Limits Praxis Praxis: Intact Cognition Overall Cognitive Status: Within Functional Limits for tasks assessed Arousal/Alertness: Awake/alert Attention: Sustained;Selective Sustained Attention: Appears intact Selective Attention: Appears intact Memory: Appears intact Awareness: Appears intact Problem Solving: Appears intact Self Monitoring: Appears intact Safety/Judgment: Appears intact Sensation Sensation Light Touch: Appears Intact Hot/Cold: Appears Intact Proprioception: Appears Intact Stereognosis: Appears Intact Motor  Motor Motor: Abnormal postural alignment and control Motor - Discharge Observations: mild L ataxia, slight L lateral lean in sitting/standing (patient reports d/t h/o of cervical dystonia Mobility  Bed Mobility Bed Mobility: Supine to Sit;Sit to Supine;Rolling Right;Rolling Left Rolling Right: Independent Rolling Left: Independent Supine to Sit: Independent Sit to Supine: Independent Transfers Sit to  Stand: Supervision/Verbal cueing Stand to Sit: Supervision/Verbal cueing  Trunk/Postural Assessment  Cervical Assessment Cervical Assessment: Exceptions to [facility] (forward cervical protraction with history of  cervical dystonia) Thoracic Assessment Thoracic Assessment: Exceptions to [facility] (left lean with slight thoracic rounding) Lumbar Assessment Lumbar Assessment: Exceptions to [facility] (posterior pelvic tilt)  Balance Balance Balance Assessed: Yes Static Sitting Balance Static Sitting - Balance Support: Feet supported Static Sitting - Level of Assistance: 6: Modified independent (Device/Increase time) Dynamic Sitting Balance Dynamic Sitting - Balance Support: During functional activity Dynamic Sitting - Level of Assistance: 6: Modified independent (Device/Increase time) Static Standing Balance Static Standing - Balance Support: Bilateral upper extremity supported Static Standing - Level of Assistance: 5: Stand by assistance Dynamic Standing Balance Dynamic Standing - Balance Support: During functional activity;Bilateral upper extremity supported Dynamic Standing - Level of Assistance:  (min contact assist with use of the RW) Extremity/Trunk Assessment RUE Assessment RUE Assessment: Within Functional Limits LUE Assessment Passive Range of Motion (PROM) Comments: WFLS Active Range of Motion (AROM) Comments: WFLS General Strength Comments: Slight ataxia still present with finger to nose testing as well as slight decreased coordination with FM tasks but pt uses the RUE functionally at a non-dominant level for all selfcare tasks. Decreased speed noted with [place] Peg Testing at 49 seconds compared to 35 on the right.   Keneshia Tena OTR/L 01/13/2021, 4:53 PM

## 2021-01-13 NOTE — Discharge Summary (Signed)
Physician Discharge Summary  Patient ID: Miguel [name] MRN: [id number] DOB/AGE: [birth year] 65 y.o.  Admit date: 12/31/2020 Discharge date: 01/14/2021  Discharge Diagnoses:  Principal Problem:   Cerebrovascular accident (CVA) of right thalamus ([place]) Active Problems:   Cerebral infarction due to embolism of left cerebellar artery (HCC) DVT prophylaxis Hypertension Mood stabilization Hyperlipidemia Cervical dystonia History of alcohol and tobacco use Constipation Urinary retention Spasmodic torticollis  Discharged Condition: Stable  Significant Diagnostic Studies: CT ANGIO HEAD NECK W WO CM  Result Date: 12/18/2020 CLINICAL DATA:  Stroke EXAM: CT ANGIOGRAPHY HEAD AND NECK TECHNIQUE: Multidetector CT imaging of the head and neck was performed using the standard protocol during bolus administration of intravenous contrast. Multiplanar CT image reconstructions and MIPs were obtained to evaluate the vascular anatomy. Carotid stenosis measurements (when applicable) are obtained utilizing NASCET criteria, using the distal internal carotid diameter as the denominator. CONTRAST:  49mL OMNIPAQUE IOHEXOL 350 MG/ML SOLN COMPARISON:  MRI head 12/18/2020.  CT head 12/18/2020 FINDINGS: CTA NECK FINDINGS Aortic arch: Standard branching. Imaged portion shows no evidence of aneurysm or dissection. No significant stenosis of the major arch vessel origins. Right carotid system: Atherosclerotic plaque right carotid bifurcation with calcified and noncalcified plaque. 50% diameter stenosis proximal right internal carotid artery. Left carotid system: Atherosclerotic disease left carotid bifurcation which is predominately calcified. No significant stenosis. Vertebral arteries: Right vertebral dominant and patent to the basilar without stenosis. Moderate to severe stenosis at the origin of the left vertebral artery. Small left vertebral artery with fading opacification distally and occlusion at the C1 level. Skeleton:  Cervical spondylosis.  No acute skeletal abnormality. Other neck: Negative for mass or adenopathy. Upper chest: Lung apices clear bilaterally. Review of the MIP images confirms the above findings CTA HEAD FINDINGS Anterior circulation: Cavernous carotid widely patent bilaterally. Anterior and middle cerebral arteries patent bilaterally without stenosis or large vessel occlusion. Posterior circulation: Right vertebral artery widely patent and supplies the basilar. Right PICA patent. Distal left vertebral artery occluded. Possible faint retrograde flow in the left V4 segment. Left PICA not visualized. Basilar widely patent. AICA patent bilaterally. Superior cerebellar and posterior cerebral arteries patent bilaterally. Venous sinuses: Normal venous enhancement. Anatomic variants: None Review of the MIP images confirms the above findings IMPRESSION: 1. Moderate to severe stenosis origin of left vertebral artery. Occlusion distal left vertebral artery at C1 which appears acute. Left PICA not visualized. Patient has acute left PICA infarct on MRI. 2. Atherosclerotic disease in the carotid bifurcation bilaterally. 50% diameter stenosis proximal right internal carotid artery. No significant left carotid stenosis. Electronically Signed   By: Franchot Gallo M.D.   On: 12/18/2020 10:29   DG Ribs Unilateral W/Chest Right  Result Date: 12/18/2020 CLINICAL DATA:  Fall, right rib pain EXAM: RIGHT RIBS AND CHEST - 3+ VIEW COMPARISON:  None. FINDINGS: No fracture or other bone lesions are seen involving the ribs. There is no evidence of pneumothorax or pleural effusion. Both lungs are clear. Heart size and mediastinal contours are within normal limits. IMPRESSION: Negative. Electronically Signed   By: Fidela Salisbury MD   On: 12/18/2020 07:13   CT Head Wo Contrast  Result Date: 12/18/2020 CLINICAL DATA:  65 year old male struck by tree limb while working in yard. Subsequent nausea and vomiting. EXAM: CT HEAD WITHOUT CONTRAST  TECHNIQUE: Contiguous axial images were obtained from the base of the skull through the vertex without intravenous contrast. COMPARISON:  Cervical spine MRI 11/15/2004. FINDINGS: Brain: No midline shift, ventriculomegaly, mass effect, evidence of mass  lesion, intracranial hemorrhage or evidence of cortically based acute infarction. Subtle asymmetric hypodensity in the central left cerebellum best seen on series 3, image 9. Elsewhere gray-white matter differentiation is within normal limits. No cerebral or cerebellar cortical encephalomalacia identified. Right side posterosuperior frontal gyrus congenital sulcal variation suspected (series 3, image 31). Vascular: Mild Calcified atherosclerosis at the skull base. No suspicious intracranial vascular hyperdensity. Skull: No fracture identified. Sinuses/Orbits: Small low-density fluid level plus circumferential mucosal thickening in the left maxillary sinus appears inflammatory. Trace sinus mucosal thickening elsewhere. Tympanic cavities are clear. Bilateral mastoids are well aerated. Other: Leftward gaze. Otherwise negative orbits soft tissues. No discrete scalp soft tissue injury identified. IMPRESSION: 1.  No acute traumatic injury identified. 2. Largely normal for age noncontrast CT appearance of the brain. But subtle asymmetric hypodensity in the central left cerebellum might be the sequelae of small vessel ischemia. 3. Mild paranasal sinus inflammation. Electronically Signed   By: Genevie Ann M.D.   On: 12/18/2020 04:22   CT Cervical Spine Wo Contrast  Result Date: 12/18/2020 CLINICAL DATA:  65 year old male struck by tree limb while working in yard. Subsequent nausea and vomiting. EXAM: CT CERVICAL SPINE WITHOUT CONTRAST TECHNIQUE: Multidetector CT imaging of the cervical spine was performed without intravenous contrast. Multiplanar CT image reconstructions were also generated. COMPARISON:  Cervical spine MRI 11/15/2004 and earlier. Head CT today reported  separately. FINDINGS: Alignment: Stable cervical lordosis since 2006. Cervicothoracic junction alignment is within normal limits. Bilateral posterior element alignment is within normal limits. Skull base and vertebrae: Visualized skull base is intact. No atlanto-occipital dissociation. Normal C1-C2 alignment. No acute osseous abnormality identified. Soft tissues and spinal canal: No prevertebral fluid or swelling. No visible canal hematoma. Negative noncontrast neck soft tissues aside from carotid calcified atherosclerosis. Disc levels: Degenerative subchondral cyst of the odontoid process suspected. Chronic ankylosis C2 on C3 is new since 2006. Advanced cervical spine disc and endplate degeneration elsewhere, with multilevel mild lower cervical spinal stenosis suspected. Upper chest: Visible upper thoracic levels and lung apices appear negative. Other: None. IMPRESSION: 1. No acute traumatic injury identified in the cervical spine. 2. Ankylosis of C2-C3 since 2006. Advanced cervical disc and endplate degeneration elsewhere, with subsequent multilevel mild spinal stenosis suspected. Electronically Signed   By: Genevie Ann M.D.   On: 12/18/2020 04:26   MR BRAIN WO CONTRAST  Addendum Date: 12/18/2020   ADDENDUM REPORT: 12/18/2020 08:38 ADDENDUM: Note is made of marked atrophy of the posterior paraspinous muscles in the upper neck, right greater than left. This was not present on the prior cervical MRI of 11/15/2004. Electronically Signed   By: Franchot Gallo M.D.   On: 12/18/2020 08:38   Result Date: 12/18/2020 CLINICAL DATA:  Dizziness. EXAM: MRI HEAD WITHOUT CONTRAST TECHNIQUE: Multiplanar, multiecho pulse sequences of the brain and surrounding structures were obtained without intravenous contrast. COMPARISON:  CT head 12/18/2020 FINDINGS: Brain: Scattered small areas of acute infarct in the left posterior and inferior cerebellum corresponding to CT hypodensity. Additional small infarct in the right thalamus.  Generalized atrophy. Mild white matter changes consistent with chronic microvascular ischemia. Negative for hemorrhage or mass. Vascular: Abnormal flow related signal distal left vertebral artery which may be stenotic or occluded. Otherwise normal arterial flow voids at the base of brain. Skull and upper cervical spine: No focal abnormality. Sinuses/Orbits: Mucosal edema paranasal sinuses.  Negative orbit Other: None IMPRESSION: Acute infarct left inferior and posterior cerebellum. Small acute infarct right thalamus. Electronically Signed: By: Franchot Gallo M.D. On:  12/18/2020 08:25   ECHOCARDIOGRAM COMPLETE  Result Date: 12/18/2020    ECHOCARDIOGRAM REPORT   Patient Name:   THORNE WIRZ Date of Exam: 12/18/2020 Medical Rec #:  [id number]  Height:       72.0 in Accession #:    [id number] Weight:       197.0 lb Date of Birth:  1956/08/30 BSA:          2.117 m Patient Age:    29 years   BP:           174/113 mmHg Patient Gender: M          HR:           66 bpm. Exam Location:  Inpatient Procedure: 2D Echo Indications:    Stroke  History:        Patient has no prior history of Echocardiogram examinations.                 Risk Factors:Hypertension.  Sonographer:    Maudry Mayhew MHA, RDMS, RVT, RDCS Referring Phys: [id number] [place]  Sonographer Comments: Image acquisition challenging due to patient body habitus. IMPRESSIONS  1. Left ventricular ejection fraction, by estimation, is 45 to 50%. The left ventricle has mildly decreased function. The left ventricle demonstrates global hypokinesis. Left ventricular diastolic parameters were normal.  2. Right ventricular systolic function is normal. The right ventricular size is normal. Tricuspid regurgitation signal is inadequate for assessing PA pressure.  3. The mitral valve is grossly normal. Mild mitral valve regurgitation.  4. The aortic valve is grossly normal. Aortic valve regurgitation is trivial.  5. The inferior vena cava is normal in size with  greater than 50% respiratory variability, suggesting right atrial pressure of 3 mmHg. Comparison(s): No prior Echocardiogram. Conclusion(s)/Recommendation(s): Normal biventricular function without evidence of hemodynamically significant valvular heart disease. No intracardiac source of embolism detected on this transthoracic study. A transesophageal echocardiogram is recommended to exclude cardiac source of embolism if clinically indicated. FINDINGS  Left Ventricle: Left ventricular ejection fraction, by estimation, is 45 to 50%. The left ventricle has mildly decreased function. The left ventricle demonstrates global hypokinesis. The left ventricular internal cavity size was normal in size. There is  no left ventricular hypertrophy. Left ventricular diastolic parameters were normal. Right Ventricle: The right ventricular size is normal. No increase in right ventricular wall thickness. Right ventricular systolic function is normal. Tricuspid regurgitation signal is inadequate for assessing PA pressure. Left Atrium: Left atrial size was normal in size. Right Atrium: Right atrial size was normal in size. Pericardium: There is no evidence of pericardial effusion. Mitral Valve: The mitral valve is grossly normal. There is mild thickening of the mitral valve leaflet(s). There is mild calcification of the mitral valve leaflet(s). Mild mitral valve regurgitation. Tricuspid Valve: The tricuspid valve is normal in structure. Tricuspid valve regurgitation is trivial. No evidence of tricuspid stenosis. Aortic Valve: The aortic valve is grossly normal. Aortic valve regurgitation is trivial. Aortic regurgitation PHT measures 324 msec. Aortic valve mean gradient measures 5.0 mmHg. Aortic valve peak gradient measures 7.7 mmHg. Aortic valve area, by VTI measures 1.61 cm. Pulmonic Valve: The pulmonic valve was not well visualized. Pulmonic valve regurgitation is not visualized. Aorta: The aortic root, ascending aorta and aortic arch  are all structurally normal, with no evidence of dilitation or obstruction. Venous: The inferior vena cava is normal in size with greater than 50% respiratory variability, suggesting right atrial pressure of 3 mmHg. IAS/Shunts: The atrial septum is grossly  normal.  LEFT VENTRICLE PLAX 2D LVIDd:         5.60 cm      Diastology LVIDs:         3.80 cm      LV e' medial:    7.78 cm/s LV PW:         0.90 cm      LV E/e' medial:  7.0 LV IVS:        0.80 cm      LV e' lateral:   15.00 cm/s LVOT diam:     1.90 cm      LV E/e' lateral: 3.6 LV SV:         46 LV SV Index:   22 LVOT Area:     2.84 cm  LV Volumes (MOD) LV vol d, MOD A2C: 119.0 ml LV vol d, MOD A4C: 125.0 ml LV vol s, MOD A2C: 61.9 ml LV vol s, MOD A4C: 67.9 ml LV SV MOD A2C:     57.1 ml LV SV MOD A4C:     125.0 ml LV SV MOD BP:      56.7 ml RIGHT VENTRICLE RV S prime:     6.96 cm/s TAPSE (M-mode): 2.1 cm LEFT ATRIUM           Index       RIGHT ATRIUM          Index LA diam:      3.00 cm 1.42 cm/m  RA Area:     8.96 cm LA Vol (A2C): 23.9 ml 11.29 ml/m RA Volume:   16.70 ml 7.89 ml/m LA Vol (A4C): 28.4 ml 13.42 ml/m  AORTIC VALVE AV Area (Vmax):    1.75 cm AV Area (Vmean):   1.63 cm AV Area (VTI):     1.61 cm AV Vmax:           139.00 cm/s AV Vmean:          104.000 cm/s AV VTI:            0.289 m AV Peak Grad:      7.7 mmHg AV Mean Grad:      5.0 mmHg LVOT Vmax:         85.80 cm/s LVOT Vmean:        59.900 cm/s LVOT VTI:          0.164 m LVOT/AV VTI ratio: 0.57 AI PHT:            324 msec  AORTA Ao Root diam: 3.20 cm MITRAL VALVE MV Area (PHT): 5.27 cm    SHUNTS MV Decel Time: 144 msec    Systemic VTI:  0.16 m MR Peak grad: 119.2 mmHg   Systemic Diam: 1.90 cm MR Mean grad: 43.0 mmHg MR Vmax:      546.00 cm/s MR Vmean:     273.0 cm/s MV E velocity: 54.70 cm/s MV A velocity: 67.30 cm/s MV E/A ratio:  0.81 Buford Dresser MD Electronically signed by Buford Dresser MD Signature Date/Time: 12/18/2020/6:24:45 PM    Final    VAS US CAROTID (at [facility]  and [facility] only)  Result Date: 12/19/2020 Carotid Arterial Duplex Study Indications:       CVA. Risk Factors:      Hypertension, past history of smoking. Limitations        Today's exam was limited due to the body habitus of the  patient. Comparison Study:  12-18-2020 CTA head and neck showed 50% diameter stenosis of                    the proximal RT ICA. Moderate to severe stenosis at the                    origin of the LT vertebral. Performing Technologist: Darlin Coco RDMS,RVT  Examination Guidelines: A complete evaluation includes B-mode imaging, spectral Doppler, color Doppler, and power Doppler as needed of all accessible portions of each vessel. Bilateral testing is considered an integral part of a complete examination. Limited examinations for reoccurring indications may be performed as noted.  Right Carotid Findings: +----------+--------+--------+--------+---------------------------+--------+           PSV cm/sEDV cm/sStenosisPlaque Description         Comments +----------+--------+--------+--------+---------------------------+--------+ CCA Prox  88      21                                                  +----------+--------+--------+--------+---------------------------+--------+ CCA Distal119     40                                                  +----------+--------+--------+--------+---------------------------+--------+ ICA Prox  89      30      1-39%   hypoechoic and heterogenous         +----------+--------+--------+--------+---------------------------+--------+ ICA Distal65      24                                                  +----------+--------+--------+--------+---------------------------+--------+ ECA       129     38                                                  +----------+--------+--------+--------+---------------------------+--------+ +----------+--------+-------+----------------+-------------------+           PSV cm/sEDV  cmsDescribe        Arm Pressure (mmHG) +----------+--------+-------+----------------+-------------------+ [id number]            Multiphasic, WNL                    +----------+--------+-------+----------------+-------------------+ +---------+--------+--+--------+--+---------+ VertebralPSV cm/s51EDV cm/s18Antegrade +---------+--------+--+--------+--+---------+  Left Carotid Findings: +----------+--------+--------+--------+------------------+--------+           PSV cm/sEDV cm/sStenosisPlaque DescriptionComments +----------+--------+--------+--------+------------------+--------+ CCA Prox  108     25                                         +----------+--------+--------+--------+------------------+--------+ CCA Mid   99      27                                         +----------+--------+--------+--------+------------------+--------+  CCA Distal67      20                                         +----------+--------+--------+--------+------------------+--------+ ICA Prox  65      14      1-39%                              +----------+--------+--------+--------+------------------+--------+ ICA Distal72      28                                         +----------+--------+--------+--------+------------------+--------+ ECA       125     31                                         +----------+--------+--------+--------+------------------+--------+ +----------+--------+--------+----------------+-------------------+           PSV cm/sEDV cm/sDescribe        Arm Pressure (mmHG) +----------+--------+--------+----------------+-------------------+ [id number]             Multiphasic, WNL                    +----------+--------+--------+----------------+-------------------+ +---------+--------+--+--------+------------------+ VertebralPSV cm/s26EDV cm/sStenotic at origin +---------+--------+--+--------+------------------+   Summary: Right Carotid:  Velocities in the right ICA are consistent with a 1-39% stenosis. Left Carotid: Velocities in the left ICA are consistent with a 1-39% stenosis. Vertebrals:  Right vertebral artery demonstrates antegrade flow. Subclavians: Normal flow hemodynamics were seen in bilateral subclavian              arteries. *See table(s) above for measurements and observations.  Electronically signed by Servando Snare MD on 12/19/2020 at 9:39:18 AM.    Final     Labs:  Basic Metabolic Panel: No results for input(s): NA, K, CL, CO2, GLUCOSE, BUN, CREATININE, CALCIUM, MG, PHOS in the last 168 hours.  CBC: No results for input(s): WBC, NEUTROABS, HGB, HCT, MCV, PLT in the last 168 hours.  CBG: No results for input(s): GLUCAP in the last 168 hours.  Family history.  Mother with uterine cancer Father with emphysema.  Denies any colon cancer esophageal cancer or rectal cancer  Brief HPI:   Miguel [name] is a 65 y.o. right-handed male with history of cervical dystonia and receives Xeomin injections in the past followed by neurology service Dr. Evelena Leyden at [facility], hypertension, depression, tobacco and alcohol use.  Per chart review lives with spouse independent prior to admission.  Two-level home half bath on main level.  Patient recently lost job 3 weeks prior.  Presented 12/18/2020 with persistent dizziness x1 week as well as left-sided ataxia.  Patient had reported on 12/16/2020 while riding his lawnmower and struck a tree without loss of consciousness.  Cranial CT scan showed no acute traumatic injury identified.  CT cervical spine no acute traumatic injury.  Patient did not receive tPA.  MRI showed acute infarct left inferior posterior cerebellum.  Small acute infarct right thalamus.  CT angiogram head and neck showed a moderate to severe stenosis origin of the left vertebral artery.  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  Atherosclerosis disease in the carotid bifurcation bilaterally 50%  diameter stenosis  proximal right internal carotid artery.  No significant left carotid stenosis.  Echocardiogram with ejection fraction 45 to 50% mild global hypokinesis, mild MR without AS.  Admission chemistries unremarkable except sodium 134 glucose 165 urinalysis negative nitrite urine drug screen positive marijuana.  Maintained on Plavix for CVA prophylaxis as well as Pletal 100 mg twice daily x3 weeks then Plavix alone.  Subcutaneous Lovenox for DVT prophylaxis.  Therapy evaluations completed due to patient decreased functional mobility was admitted for a comprehensive rehab program   Hospital Course: Slayde Brault was admitted to rehab 12/31/2020 for inpatient therapies to consist of PT, ST and OT at least three hours five days a week. Past admission physiatrist, therapy team and rehab RN have worked together to provide customized collaborative inpatient rehab.  Pertaining to patient's acute left cerebellar right thalamus ischemic infarct.  He was attending therapies.  Follow neurology services.  Maintained on Plavix 75 mg daily and Pletal 100 mg twice daily x3 weeks then Plavix alone.  Subcutaneous Lovenox for DVT prophylaxis no bleeding episodes.  Pain management with the use of low-dose tramadol.  Mood stabilization with Wellbutrin Klonopin and Effexor emotional support provided.  Lipitor ongoing for hyperlipidemia.  Blood pressure control with Avapro as well as Norvasc he would follow-up outpatient primary MD.  History of cervical dystonia followed by Dr. Evelena Leyden neurology services and now Dr. Gaye Alken in [facility] at [facility].  History of alcohol tobacco use alcohol negative on admission patient did receive Regards to cessation of nicotine products as well as any marijuana.  Bouts of urinary retention educated regarding importance of staying well-hydrated routine toileting no dysuria or hematuria bladder scans were good.  Bouts of constipation resolved with laxative assistance.  Noted  history of spasmodic torticollis as noted received Botox injection per Dr. Erling Cruz 11/19/2019.   Blood pressures were monitored on TID basis and controlled     Rehab course: During patient's stay in rehab weekly team conferences were held to monitor patient's progress, set goals and discuss barriers to discharge. At admission, patient required moderate assist sit to stand supervision supine to sit.  Minimal assist toilet transfers  Physical exam.  Blood pressure 129/89 pulse 106 temperature 98.4 respirations 16 oxygen saturation 96% room air Constitutional.  No acute distress HEENT Head.  Normocephalic and atraumatic Eyes.  Pupils round and reactive to light no discharge without nystagmus Neck.  Notable cervical dystonia Cardiac regular rate rhythm without extra sounds or murmur heard Abdomen.  Soft nontender positive bowel sounds without rebound Respiratory effort normal no respiratory distress without wheeze Skin.  Eczema on chest back and face Musculoskeletal.  5/5 upper extremities and lower extremities bilaterally Neurologic.  Alert no acute distress makes eye contact with examiner oriented x3  He/She  has had improvement in activity tolerance, balance, postural control as well as ability to compensate for deficits. He/She has had improvement in functional use RUE/LUE  and RLE/LLE as well as improvement in awareness.  Sit to stand contact-guard assist ambulates 40 feet rolling walker contact-guard.  Propels wheelchair supervision.  Uses rolling walker to complete functional mobility ADLs.  Had him work on stepping onto and off of the step with left upper extremity as well as keeping his foot propped up on the step at times monitoring for any ataxic gait.  Completed transfers to edge of bed with supervision.  Working with dynamic standing balance while reaching down to the floor with both hands to pick up cones.  He ambulated into therapy gym rolling walker and into the hallway with  supervision contact-guard.  Full family teaching completed plan discharge to home       Disposition: Discharged to home    Diet: Regular  Special Instructions: No driving smoking or alcohol  Medications at discharge 1.  Tylenol as needed 2.  Lipitor 80 mg p.o. daily 3.  Baclofen 10 mg p.o. 3 times daily prn 4.  Wellbutrin 150 mg p.o. daily 5.  Klonopin 1 mg p.o. daily 6.  Plavix 75 mg p.o. daily 7.  Antivert 25 mg p.o. twice daily as needed dizziness 8.  Melatonin 3 mg nightly 9.  MiraLAX twice daily hold for loose stools 10.  Tramadol 50 mg every 8 hours as needed pain 11.  Effexor 75 mg p.o. daily  30-35 minutes were spent completing discharge summary and discharge planning  Discharge Instructions    Ambulatory referral to Neurology   Complete by: As directed    An appointment is requested in approximately left cerebellar right thalamus ischemic infarction   Ambulatory referral to Physical Medicine Rehab   Complete by: As directed    Moderate complexity follow-up 1 to 2 weeks left cerebellar right thalamic infarction       Follow-up Information    Kirsteins, Luanna Salk, MD Follow up.   Specialty: Physical Medicine and Rehabilitation Why: office to call for appointment Contact information: [place] [address] [phone]        Gayla Medicus, MD Follow up.   Specialty: Neurology Why: Call for appointment Contact information: [id number] [place] [address] [phone]               Signed: Cathlyn Parsons 01/14/2021, 5:41 AM

## 2021-01-13 NOTE — Discharge Instructions (Signed)
Inpatient Rehab Discharge Instructions  Miguel [name] Discharge date and time: No discharge date for patient encounter.   Activities/Precautions/ Functional Status: Activity: activity as tolerated Diet: regular diet Wound Care: routine skin checks Functional status:  ___ No restrictions     ___ Walk up steps independently ___ 24/7 supervision/assistance   ___ Walk up steps with assistance ___ Intermittent supervision/assistance  ___ Bathe/dress independently ___ Walk with walker     _x__ Bathe/dress with assistance ___ Walk Independently    ___ Shower independently ___ Walk with assistance    ___ Shower with assistance ___ No alcohol     ___ Return to work/school ________  COMMUNITY REFERRALS UPON DISCHARGE:    Outpatient: PT     OT    ST                Agency: [facility]  Phone: [phone]              Appointment Date/Time:TBD By Facility Upon D/C  Medical Equipment/Items Ordered: Rolling Walker                                                 Agency/Supplier: [employer]   Special Instructions: No driving smoking or alcohol STROKE/TIA DISCHARGE INSTRUCTIONS SMOKING Cigarette smoking nearly doubles your risk of having a stroke & is the single most alterable risk factor  If you smoke or have smoked in the last 12 months, you are advised to quit smoking for your health.  Most of the excess cardiovascular risk related to smoking disappears within a year of stopping.  Ask you doctor about anti-smoking medications  [place] Quit Line: 1-800-QUIT NOW  Free Smoking Cessation Classes [phone]  CHOLESTEROL Know your levels; limit fat & cholesterol in your diet  Lipid Panel     Component Value Date/Time   CHOL 233 (H) 12/19/2020 0224   TRIG 165 (H) 12/19/2020 0224   HDL 63 12/19/2020 0224   CHOLHDL 3.7 12/19/2020 0224   VLDL 33 12/19/2020 0224   LDLCALC 137 (H) 12/19/2020 0224      Many patients benefit from treatment even if their cholesterol  is at goal.  Goal: Total Cholesterol (CHOL) less than 160  Goal:  Triglycerides (TRIG) less than 150  Goal:  HDL greater than 40  Goal:  LDL (LDLCALC) less than 100   BLOOD PRESSURE American Stroke Association blood pressure target is less that 120/80 mm/Hg  Your discharge blood pressure is:  BP: 129/89  Monitor your blood pressure  Limit your salt and alcohol intake  Many individuals will require more than one medication for high blood pressure  DIABETES (A1c is a blood sugar average for last 3 months) Goal HGBA1c is under 7% (HBGA1c is blood sugar average for last 3 months)  Diabetes:   Lab Results  Component Value Date   HGBA1C 6.4 (H) 12/19/2020     Your HGBA1c can be lowered with medications, healthy diet, and exercise.  Check your blood sugar as directed by your physician  Call your physician if you experience unexplained or low blood sugars.  PHYSICAL ACTIVITY/REHABILITATION Goal is 30 minutes at least 4 days per week  Activity: Increase activity slowly, Therapies: Physical Therapy: Home Health Return to work:   Activity decreases your risk of heart attack and stroke and makes your heart stronger.  It  helps control your weight and blood pressure; helps you relax and can improve your mood.  Participate in a regular exercise program.  Talk with your doctor about the best form of exercise for you (dancing, walking, swimming, cycling).  DIET/WEIGHT Goal is to maintain a healthy weight  Your discharge diet is:  Diet Order            Diet regular Room service appropriate? Yes; Fluid consistency: Thin  Diet effective now                 liquids Your height is:    Your current weight is: Weight: 90.2 kg Your Body Mass Index (BMI) is:  BMI (Calculated): 26.96  Following the type of diet specifically designed for you will help prevent another stroke.  Your goal weight range is:    Your goal Body Mass Index (BMI) is 19-24.  Healthy food habits can help reduce 3  risk factors for stroke:  High cholesterol, hypertension, and excess weight.  RESOURCES Stroke/Support Group:  Call [phone]   STROKE EDUCATION PROVIDED/REVIEWED AND GIVEN TO PATIENT Stroke warning signs and symptoms How to activate emergency medical system (call 911). Medications prescribed at discharge. Need for follow-up after discharge. Personal risk factors for stroke. Pneumonia vaccine given:  Flu vaccine given:  My questions have been answered, the writing is legible, and I understand these instructions.  I will adhere to these goals & educational materials that have been provided to me after my discharge from the hospital.      My questions have been answered and I understand these instructions. I will adhere to these goals and the provided educational materials after my discharge from the hospital.  Patient/Caregiver Signature _______________________________ Date __________  Clinician Signature _______________________________________ Date __________  Please bring this form and your medication list with you to all your follow-up doctor's appointments.

## 2021-01-13 NOTE — Procedures (Signed)
Botulinum toxin injection for cervical dystonia Xeomin 100U per ml CPT code [phone] Diagnosis code G 24.3 Indication is cervical dystonia that has not responded to conservative care and interferes with activities of daily living as well as cervical range of motion. Chronic cervical pain not relieved by other treatments. xeomin lot [id number] exp 06/2022 xeomin lot [id number] exp 10/2021 Informed consent was obtained after describing risks and benefits of the procedure with the patient this included bleeding bruising and infection The patient elects to proceed and has given verbal consent. Discussed that current treatment dosing and muscle group election identical to prior treatment by his neurologist Dr Ellin Goodie performed in March 2021 Patient placed in a seated position A 27-gauge 1 inch needle electrode was used to guide the injection under EMG guidance.  Muscles and dosing: Left sternocleidomastoid 80 units Left splenius capitis 25 units Right splenius capitis 30 units Right levator 25 units Right OCI 30 units Right paraspinal muscles 20 units Left trapezius 15 units  Left levator scapula 25 units  All injections done after negative drawback for blood. Patient tolerated procedure well. Post procedure instructions given. Follow up appointment made

## 2021-01-13 NOTE — Progress Notes (Addendum)
Patient ID: Miguel [name], male   DOB: 03/07/1956, 65 y.o.   MRN: [id number] Team Conference Report to Patient/Family  Team Conference discussion was reviewed with the patient and caregiver, including goals, any changes in plan of care and target discharge date.  Patient and caregiver express understanding and are in agreement.  The patient has a target discharge date of 01/14/21.  Called pt spouse, provided updates. Pt requesting not to d/c Friday, team made agreement for pt to d/c home on tomorrow 01/14/2021. Spouse in agreement, will be in attendance for family education on today. Pt will have transportation for OP per spouse. No additional questions or concerns sw will cont to follow up  Dyanne Iha 01/13/2021, 1:25 PM

## 2021-01-14 NOTE — Progress Notes (Signed)
PROGRESS NOTE   Subjective/Complaints:  No hiccups or spasms   ROS- neg CP, SOB, N/V/D  Objective:   No results found. No results for input(s): WBC, HGB, HCT, PLT in the last 72 hours. No results for input(s): NA, K, CL, CO2, GLUCOSE, BUN, CREATININE, CALCIUM in the last 72 hours.  Intake/Output Summary (Last 24 hours) at 01/14/2021 0810 Last data filed at 01/13/2021 1300 Gross per 24 hour  Intake 396 ml  Output --  Net 396 ml        Physical Exam: Vital Signs Blood pressure (!) 144/88, pulse 67, temperature 99.1 F (37.3 C), temperature source Oral, resp. rate 18, weight 90.2 kg, SpO2 96 %.   General: No acute distress Mood and affect are appropriate Heart: Regular rate and rhythm no rubs murmurs or extra sounds Lungs: Clear to auscultation, breathing unlabored, no rales or wheezes Abdomen: Positive bowel sounds, soft nontender to palpation, nondistended Extremities: No clubbing, cyanosis, or edema Skin: No evidence of breakdown, no evidence of rash Neurologic: Cranial nerves II through XII intact, motor strength is 5/5 in bilateral deltoid, bicep, tricep, grip, hip flexor, knee extensors, ankle dorsiflexor and plantar flexor  Musculoskeletal: Full range of motion in all 4 extremities. No joint swelling   Assessment/Plan:  1. Functional deficits due to Left cerebellar infarct  Stable for D/C today F/u PCP in 3-4 weeks F/u PM&R 2 weeks See D/C summary  See D/C instructions    Care Tool:  Bathing    Body parts bathed by patient: Right arm,Left arm,Chest,Abdomen,Front perineal area,Right upper leg,Left upper leg,Face,Right lower leg,Left lower leg     Body parts n/a: Buttocks   Bathing assist Assist Level: Supervision/Verbal cueing     Upper Body Dressing/Undressing Upper body dressing   What is the patient wearing?: Pull over shirt    Upper body assist Assist Level: Set up assist    Lower  Body Dressing/Undressing Lower body dressing      What is the patient wearing?: Pants,Underwear/pull up     Lower body assist Assist for lower body dressing: Supervision/Verbal cueing     Toileting Toileting    Toileting assist Assist for toileting: Supervision/Verbal cueing     Transfers Chair/bed transfer  Transfers assist     Chair/bed transfer assist level: Contact Guard/Touching assist     Locomotion Ambulation   Ambulation assist   Ambulation activity did not occur: Safety/medical concerns  Assist level: Contact Guard/Touching assist Assistive device: Walker-rolling Max distance: 10'   Walk 10 feet activity   Assist     Assist level: Contact Guard/Touching assist Assistive device: Walker-rolling   Walk 50 feet activity   Assist Walk 50 feet with 2 turns activity did not occur: Safety/medical concerns  Assist level: Contact Guard/Touching assist Assistive device: Walker-rolling    Walk 150 feet activity   Assist Walk 150 feet activity did not occur: Safety/medical concerns  Assist level: Contact Guard/Touching assist Assistive device: Walker-rolling    Walk 10 feet on uneven surface  activity   Assist Walk 10 feet on uneven surfaces activity did not occur: Safety/medical concerns   Assist level: Contact Guard/Touching assist Assistive device: Chemical engineer  Assist Will patient use wheelchair at discharge?: No             Wheelchair 50 feet with 2 turns activity    Assist            Wheelchair 150 feet activity     Assist          Blood pressure (!) 144/88, pulse 67, temperature 99.1 F (37.3 C), temperature source Oral, resp. rate 18, weight 90.2 kg, SpO2 96 %.  Medical Problem List and Plan: 1. 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 Impaired mobility -DVT/anticoagulation: Continue Lovenox -antiplatelet therapy: Plavix 75 mg daily and Pletal 100 mg twice daily x3 weeks then Plavix alone 3. Pain Management: Decrease Tramadol to q8H prn            5/7- reports pain controlled- con't regimen 4. Depression: Continue Wellbutrin 150 mg daily, Klonopin 1 mg daily, Effexor 75 mg daily, Ativan as needed -antipsychotic agents: N/A 5. Neuropsych: This patient is capable of making decisions on his own behalf. 6. Skin/Wound Care: Routine skin checks 7. Fluids/Electrolytes/Nutrition: Routine in and outs with follow-up chemistries 8. Hypertension. Continue Avapro 75 mg daily, Norvasc 10 mg daily. Monitor with increased mobility     Vitals:   12/31/20 2010 01/01/21 0423  BP: 128/89 135/85  Pulse: 98 100  Resp: 18 17  Temp: 98.4 F (36.9 C) 99 F (37.2 C)  SpO2: 97% 97%   BP controlled- 5/12 9. Hyperlipidemia. Continue Lipitor 10. Cervical dystonia. Patient has been followed in the past by Dr. Evelena Leyden and now Dr Gaye Alken in [facility] and has received Xeomin in the past Repeated botulinum toxin on 5/11 11. History of tobacco alcohol use. Alcohol negative on admission. Provide counseling 12. Eczema- might benefit from steroid creams 13. HypoK mild add KCL 59meq po qd recheck 5/2 14. Urinary rentention: educated regarding importance of staying well hydrated/voiding regularly throughout the day- it appears to be normal for him at home to void once per day. He has been drinking a lot of fluid.   15. Insomnia: start melatonin 3mg  HS  16.  Hiccups - intractible , r/t cerebellar CVA, trial baclofen , nl GFR 17. Constipation- LBM 3 days ago- will order a dose of Sorbitol x1 and monitor  18.  Spasmodic torticollis , last botox per Dr Ellin Goodie 250U on 11/19/19- will f/u in office        LOS: 14 days A FACE TO [place] 01/14/2021, 8:10 AM

## 2021-01-14 NOTE — Progress Notes (Signed)
Inpatient Rehabilitation Care Coordinator Discharge Note  The overall goal for the admission was met for:   Discharge location: Yes, home  Length of Stay: Yes, 14 Days  Discharge activity level: Yes, ambulatory level CGA  Home/community participation: Yes  Services provided included: MD, RD, PT, OT, SLP, RN, CM, TR, Pharmacy, Neuropsych and SW  Financial Services: Private Insurance: [employer] COMM  Choices offered to/list presented to:pt  Follow-up services arranged: Outpatient: [facility]  Comments (or additional information): PT OT Rolling Walker   Patient/Family verbalized understanding of follow-up arrangements: Yes  Individual responsible for coordination of the follow-up plan: [name] (spouse) [phone]  Confirmed correct DME delivered: Christina J Baskerville 01/14/2021    Christina J Baskerville 

## 2021-01-18 ENCOUNTER — Ambulatory Visit: Payer: BC Managed Care – PPO | Attending: Internal Medicine | Admitting: Physical Therapy

## 2021-01-18 ENCOUNTER — Encounter: Payer: Self-pay | Admitting: Occupational Therapy

## 2021-01-18 ENCOUNTER — Other Ambulatory Visit: Payer: Self-pay

## 2021-01-18 ENCOUNTER — Telehealth: Payer: Self-pay | Admitting: *Deleted

## 2021-01-18 ENCOUNTER — Encounter: Payer: Self-pay | Admitting: Physical Therapy

## 2021-01-18 ENCOUNTER — Ambulatory Visit: Payer: BC Managed Care – PPO | Admitting: Occupational Therapy

## 2021-01-18 VITALS — BP 119/73 | HR 85

## 2021-01-18 DIAGNOSIS — R2681 Unsteadiness on feet: Secondary | ICD-10-CM

## 2021-01-18 DIAGNOSIS — R41842 Visuospatial deficit: Secondary | ICD-10-CM | POA: Diagnosis not present

## 2021-01-18 DIAGNOSIS — R42 Dizziness and giddiness: Secondary | ICD-10-CM | POA: Insufficient documentation

## 2021-01-18 DIAGNOSIS — R260 Ataxic gait: Secondary | ICD-10-CM | POA: Diagnosis not present

## 2021-01-18 DIAGNOSIS — R278 Other lack of coordination: Secondary | ICD-10-CM | POA: Diagnosis not present

## 2021-01-18 DIAGNOSIS — R2689 Other abnormalities of gait and mobility: Secondary | ICD-10-CM

## 2021-01-18 DIAGNOSIS — R270 Ataxia, unspecified: Secondary | ICD-10-CM | POA: Insufficient documentation

## 2021-01-18 DIAGNOSIS — R29818 Other symptoms and signs involving the nervous system: Secondary | ICD-10-CM | POA: Diagnosis not present

## 2021-01-18 NOTE — Therapy (Addendum)
[facility] [address] [place], [place], [address] Phone: [phone]   Fax:  [phone]  Physical Therapy Evaluation  Patient Details  Name: Miguel [name] MRN: [id number] Date of Birth: 11-17-1955 Referring Provider (PT): Angiulli, Lavon Paganini, PA-C (will be followed by Dr. Letta Pate)   Encounter Date: 01/18/2021   PT End of Session - 01/18/21 1255    Visit Number 1    Number of Visits 19    Authorization Type BCBS - VL 30 for PT/OT (will restart on  03/04/21)    Authorization - Number of Visits 18    PT Start Time 0931    PT Stop Time 1018    PT Time Calculation (min) 47 min    Equipment Utilized During Treatment Gait belt    Activity Tolerance Patient tolerated treatment well    Behavior During Therapy [facility] for tasks assessed/performed           Past Medical History:  Diagnosis Date  . Cervical dystonia   . Depression   . Erectile dysfunction due to arterial insufficiency   . Functional dyspepsia   . Hypertension   . Pancreatitis 2005   likely secondary to alcohol use  . Rosacea     Past Surgical History:  Procedure Laterality Date  . no past surgery      Vitals:   01/18/21 0949 01/18/21 1005  BP: 118/70 119/73  Pulse: 75 85      Subjective Assessment - 01/18/21 0937    Subjective Presented 12/18/2020 with persistent dizziness x1 week as well as left-sided ataxia. MRI showed acute infarct left inferior posterior cerebellum.  Small acute infarct right thalamus. Received inpatient rehab for PT/OT/ST  - was discharged home on 01/14/21. Wears an abdnominal binder and compression stockings due to orthostatics. Has intermittent BP fluctuations when BP is too low resulting in "vertigo." Hx of spasmodic torticollis - received Botox in the hospital.  Using RW at all times. No falls. Had a "sit down" - was not wearing his abdominal binder and tipped over, did not hurt himself. Has a lot of gravel in the yard,  difficult with unlevel surfaces.    Pertinent History [name]. history of cervical dystonia and receives Xeomin injections in the past followed by neurology service Dr. Evelena Leyden at [facility], hypertension, depression, tobacco and alcohol use    How long can you walk comfortably? farthest walk in hospital was 32' with RW.    Diagnostic tests MRI showed acute infarct left inferior posterior cerebellum.  Small acute infarct right thalamus.    Patient Stated Goals ditch the RW - goes down to the cane, work on balance. work on [employer] and stairs.    Currently in Pain? No/denies              [facility] PT Assessment - 01/18/21 0950      Assessment   Medical Diagnosis CVA   acute infarct left inferior posterior cerebellum.  Small acute infarct right thalamus.   Referring Provider (PT) Cathlyn Parsons, PA-C   will be followed by Dr. Letta Pate   Onset Date/Surgical Date 12/16/20    Hand Dominance Right    Prior Therapy CIR, hospitalized 12/18/20-01/14/21      Precautions   Precautions Fall    Precaution Comments orthostatics - abdominal binder as needed for orthostatic BP, TED hose, no driving, ambulate with supervision (has gait belt)      Balance Screen   Has the patient fallen  in the past 6 months Yes    How many times? 3 or 4   also had a couple falls before hospitalization involving tripping over objects   Has the patient had a decrease in activity level because of a fear of falling?  No    Is the patient reluctant to leave their home because of a fear of falling?  No      Home Environment   Living Environment Private residence    Living Arrangements Spouse/significant other    Type of Home House    Home Access Stairs to enter    Entrance Stairs-Number of Steps 2    Entrance Stairs-Rails Right    Home Layout Multi-level    Alternate Level Stairs-Number of Steps 14   2 landings   Alternate Level Stairs-Rails Right    Home Equipment Walker - standard;Shower  seat;Transport chair;Grab bars - tub/shower;Grab bars - toilet      Prior Function   Level of Independence Independent    Vocation Full time employment    Nurse, learning disability for state    Leisure likes outdoors (building things/fixing things, mowing)      Observation/Other Assessments   Focus on Therapeutic Outcomes (FOTO)  44      Sensation   Light Touch Appears Intact      Coordination   Gross Motor Movements are Fluid and Coordinated No    Fine Motor Movements are Fluid and Coordinated No    Coordination and Movement Description Slight ataxia and decreased speed noted with finger to nose testing with the LUE    Heel Shin Test very mildly dysmetric L LE      ROM / Strength   AROM / PROM / Strength Strength      Strength   Strength Assessment Site Hip;Knee;Ankle    Right/Left Hip Right;Left    Right Hip Flexion 5/5    Left Hip Flexion 5/5    Right/Left Knee Right;Left    Right Knee Flexion 5/5    Right Knee Extension 5/5    Left Knee Flexion 5/5    Left Knee Extension 5/5    Right/Left Ankle Right;Left    Right Ankle Dorsiflexion 5/5    Left Ankle Dorsiflexion 4+/5      Transfers   Transfers Stand to Sit;Sit to Stand    Sit to Stand 5: Supervision;4: Min guard    Stand to Sit 5: Supervision    Comments with use of RW - single hand on arm rest and other hand on RW      Ambulation/Gait   Ambulation/Gait Yes    Ambulation/Gait Assistance 4: Min guard;4: Min assist    Ambulation/Gait Assistance Details initially in standing with RW, pt demonstrates more narrow BOS and scisssoring with LLE, needing min A for balance, cued for slower steps and wider BOS with pt demonstrating improvement in gait pattern after a few steps and focusing on gait    Assistive device Rolling walker    Gait Pattern Step-through pattern;Decreased stance time - left;Decreased weight shift to left;Scissoring;Ataxic;Narrow base of support;Lateral trunk lean to left    Ambulation  Surface Level;Indoor    Gait velocity 17.78 seconds = 1.84 ft/esc      Standardized Balance Assessment   Standardized Balance Assessment Timed Up and Go Test      Timed Up and Go Test   Normal TUG (seconds) 19.72    TUG Comments with RW  Objective measurements completed on examination: See above findings.               PT Education - 01/18/21 1255    Education Details clinical findings, POC    Person(s) Educated Patient;Spouse    Methods Explanation    Comprehension Verbalized understanding            PT Short Term Goals - 01/18/21 1256      PT SHORT TERM GOAL #1   Title Pt will undergo further assessment of Berg balance test to determine fall risk with LTG written as appropriate. ALL STGS DUE 02/08/21.    Baseline was 23/56 when discharged from IPR    Time 3    Period Weeks    Status New    Target Date 02/08/21      PT SHORT TERM GOAL #2   Title Pt will be independent with initial HEP with wife's support as needed in order to build upon functional gains made in therapy.    Time 3    Period Weeks    Status New      PT SHORT TERM GOAL #3   Title Pt will ambulate 18' with RW with supervision in order to demo improved household mobility.    Time 3    Period Weeks    Status New      PT SHORT TERM GOAL #4   Title Pt will decr TUG time to 17.5 seconds or less with RW in order to demo decr fall risk.    Baseline 19.72 seconds    Time 3    Period Weeks    Status New      PT SHORT TERM GOAL #5   Title Pt will ambulate up and down 8 stairs with min guard with use of R rail in order to safely go up and down the stairs in his house.    Time 3    Period Weeks    Status New             PT Long Term Goals - 01/18/21 1305      PT LONG TERM GOAL #1   Title Pt will be independent with initial HEP with wife's support as needed in order to build upon functional gains made in therapy. ALL LTGS DUE 03/01/21    Time 6    Period  Weeks    Status New    Target Date 03/01/21      PT LONG TERM GOAL #2   Title BERG goal to be written as appropriate in order to demo decr fall risk.    Time 6    Period Weeks    Status New      PT LONG TERM GOAL #3   Title Pt will ambulate at least 100' over unlevel outdoor surfaces with RW with supervision in order to demo improved community mobility.    Time 6    Period Weeks    Status New      PT LONG TERM GOAL #4   Title Pt will improve FOTO score to 57 in order to demo improved functional outcomes.    Baseline 44    Time 6    Period Weeks    Status New      PT LONG TERM GOAL #5   Title Pt will decr TUG time to 16 seconds or less with RW  vs. LRAD in order to demo decr fall risk.    Baseline 19.72 seconds  Additional Long Term Goals   Additional Long Term Goals Yes      PT LONG TERM GOAL #6   Title Pt will improve gait speed to at least 2.1 ft/sec with RW vs. LRAD in order to demo decr fall risk and improved community mobility.    Baseline 1.84 ft/sec    Time 6    Period Weeks    Status New                  Plan - 01/18/21 2042    Clinical Impression Statement Patient is a 65 year old male referred to Neuro OPPT for s/p CVA 12/16/20 (left inferior posterior cerebellum and right thalamus). Pt discharged from hospital on 01/14/21. Received inpatient rehab for PT/OT/ST.  Pt was independent prior to CVA, but currently needs CGA for mobility and is having orthostatic hypotension. Pt's PMH is significant for: Cervical dystonia, HTN, depression, anxiety.  The following deficits were present during the exam: ataxia, decr strength, decr coordination, impaired balance, gait abnormalities.  Based on TUG and gait speed, pt is an incr risk for falls. Pt would benefit from skilled PT to address these impairments and functional limitations to maximize functional mobility independence    Personal Factors and Comorbidities Comorbidity 3+;Past/Current Experience     Comorbidities Cervical dystonia, HTN, depression, anxiety.    Examination-Activity Limitations Stairs;Stand;Squat;Locomotion Level;Transfers;Bend    Examination-Participation Restrictions Community Activity;Driving;Yard Work;Occupation    Stability/Clinical Decision Making Evolving/Moderate complexity    Clinical Decision Making Moderate    Rehab Potential Good    PT Frequency 3x / week   pt's insurance re-starts at beginning of July and has a 30 VL between PT/OT   PT Duration 6 weeks    PT Treatment/Interventions ADLs/Self Care Home Management;Stair training;Gait training;DME Instruction;Functional mobility training;Therapeutic activities;Therapeutic exercise;Neuromuscular re-education;Balance training;Patient/family education;Vestibular    PT Next Visit Plan perform BERG with LTG written as appropriate. initiate and review pt's previous HEP from inpatient rehab. gait training with RW. work on weight bearing activities due to ataxia. pt with orthostatic hypotension - be aware.    Consulted and Agree with Plan of Care Patient;Family member/caregiver    Family Member Consulted pt's wife           Patient will benefit from skilled therapeutic intervention in order to improve the following deficits and impairments:  Abnormal gait,Decreased coordination,Decreased balance,Decreased safety awareness,Decreased strength,Difficulty walking,Dizziness,Decreased activity tolerance  Visit Diagnosis: Unsteadiness on feet  Ataxic gait  Other abnormalities of gait and mobility  Dizziness and giddiness  Other symptoms and signs involving the nervous system     Problem List Patient Active Problem List   Diagnosis Date Noted  . Cerebrovascular accident (CVA) of right thalamus ([place]) 12/31/2020  . Cerebral infarction due to embolism of left cerebellar artery ([place]) 12/31/2020  . Alcohol use 12/19/2020  . Acute CVA (cerebrovascular accident) ([place]) 12/18/2020  . Depression with anxiety 12/18/2020  .  Essential hypertension 12/18/2020  . Isolated cervical dystonia 12/06/2016    Arliss Journey, PT, DPT  01/18/2021, 8:51 PM   [facility] [address] [place], [place], [address] Phone: [phone]   Fax:  [phone]  Name: Miguel [name] MRN: [id number] Date of Birth: [birth year]

## 2021-01-18 NOTE — Addendum Note (Signed)
Addended by: Arliss Journey on: 01/18/2021 08:52 PM   Modules accepted: Orders

## 2021-01-18 NOTE — Therapy (Signed)
Miguel [name] [address] [place], [place], [address] Phone: [phone]   Fax:  [phone]  Occupational Therapy Evaluation  Patient Details  Name: Miguel [name] MRN: [id number] Date of Birth: 1955/09/29 Referring Provider (OT): Dr. Alysia Penna   Encounter Date: 01/18/2021   OT End of Session - 01/18/21 1147    Visit Number 1    Number of Visits 12    Date for OT Re-Evaluation 03/04/21    Authorization Type BCBS, 30 visit limit combined OT/PT.  Wife reports visit limits re-start 03/05/21.    Authorization - Visit Number 1    Authorization - Number of Visits 12   PT anticipates using 18 visits prior to 03/04/21)   OT Start Time 0848    OT Stop Time 0930    OT Time Calculation (min) 42 min    Equipment Utilized During Treatment **FOTO**    Activity Tolerance Patient tolerated treatment well    Behavior During Therapy WFL for tasks assessed/performed           Past Medical History:  Diagnosis Date  . Cervical dystonia   . Depression   . Erectile dysfunction due to arterial insufficiency   . Functional dyspepsia   . Hypertension   . Pancreatitis 2005   likely secondary to alcohol use  . Rosacea     Past Surgical History:  Procedure Laterality Date  . no past surgery      There were no vitals filed for this visit.   Subjective Assessment - 01/18/21 0850    Subjective  "I want to be able to walk without a walker"    Patient is accompanied by: Family member   Miguel [name]--wife   Pertinent History CVA  12/16/20.  PMH:  Cervical dystonia, HTN, depression, anxiety, orthostatic hypotension    Limitations orthostatic hypotension, monitor BP    Patient Stated Goals to be able to walk without a walker, do more at home (improve balance, be able to carry items, incr independence).    Currently in Pain? No/denies             [facility] OT Assessment - 01/18/21 0001      Assessment   Medical Diagnosis CVA    Referring  Provider (OT) Dr. Alysia Penna    Onset Date/Surgical Date 12/16/20    Hand Dominance Right    Prior Therapy CIR, hospitalized 12/18/20-01/14/21      Precautions   Precautions Fall   abdominal binder as needed for orthostatic BP, TED hose, no driving, ambulate with supervision (has gait belt)   Precaution Comments monitor BP for orthostatic hypotension      Balance Screen   Has the patient fallen in the past 6 months Yes    How many times? 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   Leisure likes outdoors (building things/fixing things, mowing)      ADL   Eating/Feeding Modified independent    Grooming Set up    Upper Body Bathing Supervision/safety    Lower Body Bathing Supervision/safety    Upper Body Dressing Supervision/safety    Lower Body Dressing Supervision/safety    Toilet Transfer Supervision/safety    Toileting -  Clothing Manipulation --   supervision for balance   Toileting -  Hygiene Modified Independent    Tub/Shower Transfer Supervision/safety   in hospital (sponge bathing currently as shower is upstairs)   HydrologistTub/Shower Transfer Equipment Transfer tub bench;Walk in shower   in process of getting grab bars (currently sponge bathing)   ADL comments Pt with inability with leaning over, bending due to orthostatic BP.      IADL   Prior Level of Function Light Housekeeping independent prior (including yardwork), currently unable    Prior Level of Function Meal Prep independent prior, currently unable    Prior Level of Function Community Mobility independent    Community Mobility Relies on family or friends for transportation      Mobility   Mobility Status History of falls;Needs assist   needs supervision, 1 fall (sat of floor, no injury)    Mobility Status Comments ambulating with RW with CGA (with gait belt); ordered transport w/c.  Ataxia with gait.  Unable to lift/carry anything while walking, uable to squat/bend.      Vision - History   Baseline Vision Wears contact      Vision Assessment   Tracking/Visual Pursuits Able to track stimulus in all quads without difficulty    Saccades Decreased speed of saccadic movement   additional eye shifts   Diplopia Assessment --   denies   Comment Pt reports that riding in car for first 5 minutes make him dizzy and has to wait prior to getting up.  Improves throughout the day.  Pt with nystagmus with quicker movements/saccades.  Pt reports watching tv without difficulty.      Cognition   Overall Cognitive Status Within Functional Limits for tasks assessed   per pt/wife report   Cognition Comments pt does not feel that he needs to pursue speech therapy at this time.      Observation/Other Assessments   Focus on Therapeutic Outcomes (FOTO)  53      Sensation   Light Touch Appears Intact   pt reports mild numbness on L side of your face and temperature change in legs (R leg feels warmer).     Coordination   Gross Motor Movements are Fluid and Coordinated No    Fine Motor Movements are Fluid and Coordinated No    Finger Nose Finger Test mild decr coordination (speed/accuracy) BUEs, but incr difficulty L>R    9 Hole Peg Test Right;Left    Right 9 Hole Peg Test 32.88sec    Left 9 Hole Peg Test 40.97sec      ROM / Strength   AROM / PROM / Strength AROM;Strength      AROM   Overall AROM  Within functional limits for tasks performed      Strength   Overall Strength Deficits    Overall Strength Comments Proximal strength LUE grossly 5/5 except 4/5 shoulder flex and abduction      Hand Function   Right Hand Grip (lbs) 89.7lbs    Left Hand Grip (lbs) 73.1                           OT Education - 01/18/21 1134    Education Details OT eval results/POC; Reviewed  info from insurance verification (Pt does not feel like he wants to pursue speech therapy at this time and pt/wife to discuss further and notify front desk if they wish to schedule eval).    Person(s) Educated Patient;Spouse  Methods Explanation    Comprehension Verbalized understanding            OT Short Term Goals - 01/18/21 1150      OT SHORT TERM GOAL #1   Title Pt will  be independent with updated HEP (BUE coordination and LUE strength).--check STGs 02/17/21.    Time 4    Period Weeks    Status New      OT SHORT TERM GOAL #2   Title Pt will perform BADLs except bathing mod I.    Time 4    Period Weeks    Status New      OT SHORT TERM GOAL #3   Title Pt will be able to perform simple home maintenance task in standing with supervision.    Time 4    Period Weeks    Status New             OT Long Term Goals - 01/18/21 1157      OT LONG TERM GOAL #1   Title Pt will perform simple meal prep task with supervision.    Time 6    Period Weeks    Status New      OT LONG TERM GOAL #2   Title Pt will perform simple home maintenance task mod I.    Time 6    Period Weeks    Status New      OT LONG TERM GOAL #3   Title Pt will peform environmental scanning/navigation in busy environment mod I without LOB or dizziness for community activities and incr safety    Time 6    Period Weeks    Status New      OT LONG TERM GOAL #4   Title Pt will verbalize understanding of AE/strategies for incr safety/independence with ADLs/IADLs.    Time 6    Period Weeks    Status New      OT LONG TERM GOAL #5   Title Pt will improve UE functional use, strength, and coordination as shown by improving FOTO score to at least 62.    Time 6    Period Weeks    Status New                 Plan - 01/18/21 1212    Clinical Impression Statement Pt is a 65 y.o. male referred to occupational therapy s/p CVA 12/16/20.  Pt was independent prior to CVA, but currently needs CGA for  mobility and is having orthostatic hypotension.  Pt with PMH that includes:  Cervical dystonia, HTN, depression, anxiety.  Pt presents today with ataxia, decr balance/functional mobility for ADLs/IADLs, decr strength, decr coordination, and visual deficits.  Pt would benefit from occupational therapy to address these deficits for incr safety and indpendence with ADLs and IADLs.    OT Occupational Profile and History Detailed Assessment- Review of Records and additional review of physical, cognitive, psychosocial history related to current functional performance    Occupational performance deficits (Please refer to evaluation for details): ADL's;IADL's;Work;Leisure    Body Structure / Function / Physical Skills ADL;Balance;Dexterity;Decreased knowledge of use of DME;Strength;GMC;UE functional use;IADL;Coordination;Mobility;FMC    Rehab Potential Good    Clinical Decision Making Several treatment options, min-mod task modification necessary    Comorbidities Affecting Occupational Performance: May have comorbidities impacting occupational performance    Modification or Assistance to Complete Evaluation  Min-Moderate modification of tasks or assist with assess necessary to complete eval    OT Frequency 2x /  week    OT Duration 6 weeks   +eval.  Wife reports that insurace visit limits restart 03/05/21; Will reassess need for further occupational therapy after 6 weeks.   OT Treatment/Interventions Self-care/ADL training;Moist Heat;DME and/or AE instruction;Balance training;Therapeutic activities;Aquatic Therapy;Therapeutic exercise;Visual/perceptual remediation/compensation;Functional Mobility Training;Neuromuscular education;Cryotherapy;Energy conservation;Patient/family education;Manual Therapy    Plan check BP; standing balance, functional reaching    Consulted and Agree with Plan of Care Patient;Family member/caregiver    Family Member Consulted wife           Patient will benefit from skilled  therapeutic intervention in order to improve the following deficits and impairments:   Body Structure / Function / Physical Skills: ADL,Balance,Dexterity,Decreased knowledge of use of DME,Strength,GMC,UE functional use,IADL,Coordination,Mobility,FMC       Visit Diagnosis: Unsteadiness on feet - Plan: Ot plan of care cert/re-cert, CANCELED: Ot plan of care cert/re-cert  Other abnormalities of gait and mobility - Plan: Ot plan of care cert/re-cert, CANCELED: Ot plan of care cert/re-cert  Visuospatial deficit - Plan: Ot plan of care cert/re-cert, CANCELED: Ot plan of care cert/re-cert  Ataxia - Plan: Ot plan of care cert/re-cert, CANCELED: Ot plan of care cert/re-cert  Other lack of coordination - Plan: Ot plan of care cert/re-cert, CANCELED: Ot plan of care cert/re-cert    Problem List Patient Active Problem List   Diagnosis Date Noted  . Cerebrovascular accident (CVA) of right thalamus ([place]) 12/31/2020  . 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 Alcohol use 12/19/2020  . Acute CVA (cerebrovascular accident) ([place]) 12/18/2020  . Depression with anxiety 12/18/2020  . Essential hypertension 12/18/2020  . Isolated cervical dystonia 12/06/2016    [facility] 01/18/2021, 2:55 PM  [facility] [address] [place], [place], [address] Phone: [phone]   Fax:  [phone]  Name: Chi Garlow MRN: [id number] Date of Birth: November 28, 1955   Vianne Bulls, OTR/L [facility] [address] [place], [place]  [phone] phone [phone] 01/18/21 2:55 PM

## 2021-01-18 NOTE — Telephone Encounter (Signed)
Transitional Care call  Contacted patients spouse, Langley Ingalls at their home, per social work discharge note  Confirmed appointment May 25th, 2022, 9AM at [address] [place], [place], [place] [address]  Transitional Care Questions  1. Are you/is patient experiencing any problems since coming home? The biggest concern patient has is he feels his blood pressure is to low and is the reason for his lightheadedness. He says he starts out 108/70 and will climb up to 120/80.  He feels normal for him is in the 130/80 range. Are there any questions regarding any aspect of care? No  2. Are there any questions regarding medications administration/dosing?  No Are meds being taken as prescribed? Yes Patient should review meds with caller to confirm   3. Have there been any falls? No  4. Has Home Health been to the house and/or have they contacted you? Straight to outpatient therapies at [facility] If not, have you tried to contact them? Can we help you contact them?   5. Are bowels and bladder emptying properly?  Yes Are there any unexpected incontinence issues?  No  If applicable, is patient following bowel/bladder programs?   6. Any fevers, problems with breathing, unexpected pain? No  7. Are there any skin problems or new areas of breakdown? No  8. Has the patient/family member arranged specialty MD follow up (ie cardiology/neurology/renal/surgical/etc)? Yes Can we help arrange? No  9. Does the patient need any other services or support that we can help arrange? No  10. Are caregivers following through as expected in assisting the patient? Yes  11. Has the patient quit smoking, drinking alcohol, or using drugs as recommended? No smoke, no illicit drugs, occasional alcohol

## 2021-01-19 ENCOUNTER — Ambulatory Visit: Payer: BC Managed Care – PPO | Admitting: Physical Therapy

## 2021-01-19 ENCOUNTER — Encounter: Payer: Self-pay | Admitting: Physical Therapy

## 2021-01-19 ENCOUNTER — Encounter: Payer: Self-pay | Admitting: Occupational Therapy

## 2021-01-19 ENCOUNTER — Ambulatory Visit: Payer: BC Managed Care – PPO | Admitting: Occupational Therapy

## 2021-01-19 ENCOUNTER — Telehealth: Payer: Self-pay

## 2021-01-19 VITALS — BP 111/68 | HR 82

## 2021-01-19 DIAGNOSIS — R2681 Unsteadiness on feet: Secondary | ICD-10-CM

## 2021-01-19 DIAGNOSIS — R2689 Other abnormalities of gait and mobility: Secondary | ICD-10-CM | POA: Diagnosis not present

## 2021-01-19 DIAGNOSIS — R260 Ataxic gait: Secondary | ICD-10-CM | POA: Diagnosis not present

## 2021-01-19 DIAGNOSIS — R270 Ataxia, unspecified: Secondary | ICD-10-CM

## 2021-01-19 DIAGNOSIS — R278 Other lack of coordination: Secondary | ICD-10-CM

## 2021-01-19 DIAGNOSIS — R29818 Other symptoms and signs involving the nervous system: Secondary | ICD-10-CM | POA: Diagnosis not present

## 2021-01-19 DIAGNOSIS — R41842 Visuospatial deficit: Secondary | ICD-10-CM | POA: Diagnosis not present

## 2021-01-19 DIAGNOSIS — R42 Dizziness and giddiness: Secondary | ICD-10-CM

## 2021-01-19 NOTE — Telephone Encounter (Signed)
Please address blood pressure issues with PCP

## 2021-01-19 NOTE — Patient Instructions (Signed)
Access Code: [email or web address] URL: [email or web address] Date: 01/19/2021 Prepared by: Willow Ora  Exercises Sit to Stand with Counter Support - 1 x daily - 5 x weekly - 1 sets - 5-10 reps Heel Toe Raises with Counter Support - 1 x daily - 5 x weekly - 1 sets - 10 reps Standing March with Counter Support - 1 x daily - 5 x weekly - 1 sets - 10 reps Standing Hip Abduction with Counter Support - 1 x daily - 5 x weekly - 1 sets - 10 reps Wide Stance with Head Nods and Counter Support - 1 x daily - 5 x weekly - 1 sets - 10 reps

## 2021-01-19 NOTE — Telephone Encounter (Signed)
Blanch Media wife of patient called stating patient BP dropping during PT, today 90/60 and dizzy. She states he was told that a BP med can be given to him for this.

## 2021-01-19 NOTE — Therapy (Signed)
[facility] [address] [place], [place], [address] Phone: [phone]   Fax:  [phone]  Physical Therapy Treatment  Patient Details  Name: Miguel [name] MRN: [id number] Date of Birth: [birth year] Referring Provider (PT): Angiulli, Lavon Paganini, PA-C (will be followed by Dr. Letta Pate)   Encounter Date: 01/19/2021   PT End of Session - 01/19/21 0809    Visit Number 2    Number of Visits 19    Authorization Type BCBS - VL 30 for PT/OT (will restart on  03/04/21)    Authorization - Number of Visits 18    PT Start Time 0804    PT Stop Time 0845    PT Time Calculation (min) 41 min    Equipment Utilized During Treatment Gait belt    Activity Tolerance Patient tolerated treatment well    Behavior During Therapy [facility] for tasks assessed/performed           Past Medical History:  Diagnosis Date  . Cervical dystonia   . Depression   . Erectile dysfunction due to arterial insufficiency   . Functional dyspepsia   . Hypertension   . Pancreatitis 2005   likely secondary to alcohol use  . Rosacea     Past Surgical History:  Procedure Laterality Date  . no past surgery      There were no vitals filed for this visit.   Subjective Assessment - 01/19/21 0807    Subjective No new complaints. No falls. Just finished with OT and got dizzy when he stood up from chair to walk to mat table. Has been dropping > 10 points with OT when standing. Has bil TED hose on and abdominal binder on as well.    Pertinent History [name]. history of cervical dystonia and receives Xeomin injections in the past followed by neurology service Dr. Evelena Le[facility]den [facility], hypertension, depression, tobacco and alcohol use    How long can you walk comfortably? farthest walk in hospital was 86' with RW.    Diagnostic tests MRI showed acute infarct left inferior posterior cerebellum.  Small acute infarct right thalamus.    Currently in  Pain? No/denies    Pain Score 0-No pain           Vital Signs with session as follows: Start of session:  08:04 am: seated BP 115/70, HR 82. Pt reports this is really low in sitting for him. Supplied pt with water and waited for recheck. 08:11 am: seated BP 121/76, HR 81. 08:13 am: standing BP 94/60, HR 87. Pt asymptomatic. 08:16 am: standing BP 113/74, HR 89. Initiated 1st rep of gait after this reading.  Multiple stands next to mat with no symptoms reported. No BP readings taken due to short duration of standing each time. No symptoms reported with these stands as well.    End of session 08:42 am: seated 129/78, HR 80 08:44 am: standing 109/67, HR 88 08:47 am: standing 132/77, HR 91. Gait out of gym to lobby at this time with no symptoms reported.         [facility] Adult PT Treatment/Exercise - 01/19/21 0817      Transfers   Transfers Stand to Sit;Sit to Stand    Sit to Stand 5: Supervision;4: Min guard    Stand to Sit 5: Supervision;4: Min guard      Ambulation/Gait   Ambulation/Gait Yes    Ambulation/Gait Assistance 4: Min guard;4: Min assist    Ambulation/Gait Assistance Details cues  for posture, to stay within walker and for step placement. Pt listing to left with first gait rep, none noted with second rep. cues for wider base of support and to slow down which improved his balance to just min guard assist needed.    Ambulation Distance (Feet) 30 Feet   x1, 100 x1   Assistive device Rolling walker    Gait Pattern Step-through pattern;Decreased stance time - left;Decreased weight shift to left;Scissoring;Ataxic;Narrow base of support;Lateral trunk lean to left    Ambulation Surface Level;Indoor      Exercises   Exercises Other Exercises    Other Exercises  Initiated HEP for home strengthening and balance. Pt currently performing LE ex's in sitting with green band from CIR. Refer to [place] program for ex's added today. Min guard assist for safety with cues on ex form and  technique provided.          Issued to HEP this session:  Access Code: [email or web address] URL: [email or web address] Date: 01/19/2021 Prepared by: Willow Ora  Exercises Sit to Stand with Counter Support - 1 x daily - 5 x weekly - 1 sets - 5-10 reps Heel Toe Raises with Counter Support - 1 x daily - 5 x weekly - 1 sets - 10 reps Standing March with Counter Support - 1 x daily - 5 x weekly - 1 sets - 10 reps Standing Hip Abduction with Counter Support - 1 x daily - 5 x weekly - 1 sets - 10 reps Wide Stance with Head Nods and Counter Support - 1 x daily - 5 x weekly - 1 sets - 10 reps        PT Education - 01/19/21 1128    Education Details safety with standing due to orthostatics; initial HEP    Person(s) Educated Patient    Methods Explanation;Demonstration;Verbal cues;Handout    Comprehension Verbalized understanding;Returned demonstration;Verbal cues required;Need further instruction            PT Short Term Goals - 01/18/21 1256      PT SHORT TERM GOAL #1   Title Pt will undergo further assessment of Berg balance test to determine fall risk with LTG written as appropriate. ALL STGS DUE 02/08/21.    Baseline was 23/56 when discharged from [facility]    Time 3    Period Weeks    Status New    Target Date 02/08/21      PT SHORT TERM GOAL #2   Title Pt will be independent with initial HEP with wife's support as needed in order to build upon functional gains made in therapy.    Time 3    Period Weeks    Status New      PT SHORT TERM GOAL #3   Title Pt will ambulate 86' with RW with supervision in order to demo improved household mobility.    Time 3    Period Weeks    Status New      PT SHORT TERM GOAL #4   Title Pt will decr TUG time to 17.5 seconds or less with RW in order to demo decr fall risk.    Baseline 19.72 seconds    Time 3    Period Weeks    Status New      PT SHORT TERM GOAL #5   Title Pt will ambulate up and down 8 stairs with min guard with use of  R rail in order to safely go up and down the stairs in his house.  Time 3    Period Weeks    Status New             PT Long Term Goals - 01/18/21 1305      PT LONG TERM GOAL #1   Title Pt will be independent with initial HEP with wife's support as needed in order to build upon functional gains made in therapy. ALL LTGS DUE 03/01/21    Time 6    Period Weeks    Status New    Target Date 03/01/21      PT LONG TERM GOAL #2   Title BERG goal to be written as appropriate in order to demo decr fall risk.    Time 6    Period Weeks    Status New      PT LONG TERM GOAL #3   Title Pt will ambulate at least 100' over unlevel outdoor surfaces with RW with supervision in order to demo improved community mobility.    Time 6    Period Weeks    Status New      PT LONG TERM GOAL #4   Title Pt will improve FOTO score to 57 in order to demo improved functional outcomes.    Baseline 44    Time 6    Period Weeks    Status New      PT LONG TERM GOAL #5   Title Pt will decr TUG time to 16 seconds or less with RW  vs. LRAD in order to demo decr fall risk.    Baseline 19.72 seconds      Additional Long Term Goals   Additional Long Term Goals Yes      PT LONG TERM GOAL #6   Title Pt will improve gait speed to at least 2.1 ft/sec with RW vs. LRAD in order to demo decr fall risk and improved community mobility.    Baseline 1.84 ft/sec    Time 6    Period Weeks    Status New                 Plan - 01/19/21 0809    Clinical Impression Statement Today's skilled session focused on monitoring of pt's BP with activity as pt continues to be orthostatic. During session initial HEP for strengthening and balance issued with no issues noted or reported with performance in session today. The pt's is progressing, limited by orthostatics. The pt should benefit from continued PT to progress toward unmet goals.    Personal Factors and Comorbidities Comorbidity 3+;Past/Current Experience     Comorbidities Cervical dystonia, HTN, depression, anxiety.    Examination-Activity Limitations Stairs;Stand;Squat;Locomotion Level;Transfers;Bend    Examination-Participation Restrictions Community Activity;Driving;Yard Work;Occupation    Stability/Clinical Decision Making Evolving/Moderate complexity    Rehab Potential Good    PT Frequency 3x / week   pt's insurance re-starts at beginning of July and has a 30 VL between PT/OT   PT Duration 6 weeks    PT Treatment/Interventions ADLs/Self Care Home Management;Stair training;Gait training;DME Instruction;Functional mobility training;Therapeutic activities;Therapeutic exercise;Neuromuscular re-education;Balance training;Patient/family education;Vestibular    PT Next Visit Plan perform BERG with LTG written as appropriate.  gait training with RW. work on weight bearing activities due to ataxia. pt with orthostatic hypotension - be aware.    PT Home Exercise Plan Access Code [email or web address]    Consulted and Agree with Plan of Care Patient    Family Member Consulted --           Patient will benefit  from skilled therapeutic intervention in order to improve the following deficits and impairments:  Abnormal gait,Decreased coordination,Decreased balance,Decreased safety awareness,Decreased strength,Difficulty walking,Dizziness,Decreased activity tolerance  Visit Diagnosis: Unsteadiness on feet  Other abnormalities of gait and mobility  Ataxic gait  Dizziness and giddiness  Other symptoms and signs involving the nervous system     Problem List Patient Active Problem List   Diagnosis Date Noted  . Cerebrovascular accident (CVA) of right thalamus ([place]) 12/31/2020  . Cerebral infarction due to embolism of left cerebellar artery ([place]) 12/31/2020  . Alcohol use 12/19/2020  . Acute CVA (cerebrovascular accident) ([place]) 12/18/2020  . Depression with anxiety 12/18/2020  . Essential hypertension 12/18/2020  . Isolated cervical dystonia 12/06/2016     Willow Ora, PTA, [facility] [address], [place], [place] [address] [phone] 01/19/21, 11:47 AM   Name: Romello Hoehn MRN: [id number] Date of Birth: 22-Sep-1955

## 2021-01-19 NOTE — Therapy (Signed)
[facility] [address] [place], [place], [address] Phone: [phone]   Fax:  [phone]  Occupational Therapy Treatment  Patient Details  Name: Miguel [name] MRN: [id number] Date of Birth: 01-01-1956 Referring Provider (OT): Dr. Alysia Penna   Encounter Date: 01/19/2021   OT End of Session - 01/19/21 0741    Visit Number 2    Number of Visits 12    Date for OT Re-Evaluation 03/04/21    Authorization Type BCBS, 30 visit limit combined OT/PT.  Wife reports visit limits re-start 03/05/21.    Authorization - Visit Number 2    Authorization - Number of Visits 12   PT anticipates using 18 visits prior to 03/04/21)   OT Start Time 0720    OT Stop Time 0800    OT Time Calculation (min) 40 min    Equipment Utilized During Treatment **FOTO**    Activity Tolerance Patient tolerated treatment well    Behavior During Therapy [facility] for tasks assessed/performed           Past Medical History:  Diagnosis Date  . Cervical dystonia   . Depression   . Erectile dysfunction due to arterial insufficiency   . Functional dyspepsia   . Hypertension   . Pancreatitis 2005   likely secondary to alcohol use  . Rosacea     Past Surgical History:  Procedure Laterality Date  . no past surgery      Vitals:   01/19/21 0724 01/19/21 0725 01/19/21 0733  BP: (!) 117/55 (sitting) (!) 99/57 (after standing 32min) 111/68  (after standing 68min)  Pulse: 82       Subjective Assessment - 01/19/21 0724    Subjective  My left leg is really weak this morning    Patient is accompanied by: Family member   [name]--wife   Pertinent History CVA  12/16/20.  PMH:  Cervical dystonia, HTN, depression, anxiety, orthostatic hypotension    Limitations orthostatic hypotension, monitor BP    Patient Stated Goals to be able to walk without a walker, do more at home (improve balance, be able to carry items, incr independence).    Currently in Pain? No/denies               Standing at counter and performed lateral wt. Shifts in small range with overhead reaching with each UE.  3/10 dizziness, resolving quickly.  Arm bike x41min level 6 for conditioning, reciprocal movement without rest, approx 40rpms.  BP sitting 122/74 after (HR 88), then 108/70 after standing (HR 90).  Standing, performing functional reaching overhead with each UE while standing at counter with UE support.    Environmental scanning/navigation in mod busy environment with CGA/min A for balance with ER of L hip and supination of L foot noted at times.  Pt cued to stop and plant foot/re-set and slow down.    Completing Purdue Pegboard with each hand for incr coordination with min difficulty.   Pt reports that if he's been up for 53min, he can lean over to get dressed.  He was able to tie shoes and put TED hose on himself today.  At end of session, pt stood after sitting and reported dizziness, pt instructed to return to sitting with CGA.  Pt rested in this position 1-20min before PT (see PT notes)  Pt instructed to always slow down and wait prior to moving (with supervision) when he changes positions and to stop and reset foot and slow down if he starts to  drift to the left with ambulation.         OT Short Term Goals - 01/18/21 1150      OT SHORT TERM GOAL #1   Title Pt will  be independent with updated HEP (BUE coordination and LUE strength).--check STGs 02/17/21.    Time 4    Period Weeks    Status New      OT SHORT TERM GOAL #2   Title Pt will perform BADLs except bathing mod I.    Time 4    Period Weeks    Status New      OT SHORT TERM GOAL #3   Title Pt will be able to perform simple home maintenance task in standing with supervision.    Time 4    Period Weeks    Status New             OT Long Term Goals - 01/18/21 1157      OT LONG TERM GOAL #1   Title Pt will perform simple meal prep task with supervision.    Time 6    Period Weeks    Status  New      OT LONG TERM GOAL #2   Title Pt will perform simple home maintenance task mod I.    Time 6    Period Weeks    Status New      OT LONG TERM GOAL #3   Title Pt will peform environmental scanning/navigation in busy environment mod I without LOB or dizziness for community activities and incr safety    Time 6    Period Weeks    Status New      OT LONG TERM GOAL #4   Title Pt will verbalize understanding of AE/strategies for incr safety/independence with ADLs/IADLs.    Time 6    Period Weeks    Status New      OT LONG TERM GOAL #5   Title Pt will improve UE functional use, strength, and coordination as shown by improving FOTO score to at least 62.    Time 6    Period Weeks    Status New                 Plan - 01/19/21 0741    Clinical Impression Statement Pt is progressing towards goals, but needs close monitoring of BP as pt continues to experience drops in BP with position changes and low BP.  Pt demo incr difficulty with balance with low BP.    OT Occupational Profile and History Detailed Assessment- Review of Records and additional review of physical, cognitive, psychosocial history related to current functional performance    Occupational performance deficits (Please refer to evaluation for details): ADL's;IADL's;Work;Leisure    Body Structure / Function / Physical Skills ADL;Balance;Dexterity;Decreased knowledge of use of DME;Strength;GMC;UE functional use;IADL;Coordination;Mobility;FMC    Rehab Potential Good    Clinical Decision Making Several treatment options, min-mod task modification necessary    Comorbidities Affecting Occupational Performance: May have comorbidities impacting occupational performance    Modification or Assistance to Complete Evaluation  Min-Moderate modification of tasks or assist with assess necessary to complete eval    OT Frequency 2x / week    OT Duration 6 weeks   +eval.  Wife reports that insurace visit limits restart 03/05/21; Will  reassess need for further occupational therapy after 6 weeks.   OT Treatment/Interventions Self-care/ADL training;Moist Heat;DME and/or AE instruction;Balance training;Therapeutic activities;Aquatic Therapy;Therapeutic exercise;Visual/perceptual remediation/compensation;Functional Mobility Training;Neuromuscular education;Cryotherapy;Energy conservation;Patient/family education;Manual  Therapy    Plan check BP; standing balance, functional reaching    Consulted and Agree with Plan of Care Patient;Family member/caregiver    Family Member Consulted wife           Patient will benefit from skilled therapeutic intervention in order to improve the following deficits and impairments:   Body Structure / Function / Physical Skills: ADL,Balance,Dexterity,Decreased knowledge of use of DME,Strength,GMC,UE functional use,IADL,Coordination,Mobility,FMC       Visit Diagnosis: Unsteadiness on feet  Visuospatial deficit  Ataxia  Other lack of coordination    Problem List Patient Active Problem List   Diagnosis Date Noted  . Cerebrovascular accident (CVA) of right thalamus ([place]) 12/31/2020  . Cerebral infarction due to embolism of left cerebellar artery ([place]) 12/31/2020  . Alcohol use 12/19/2020  . Acute CVA (cerebrovascular accident) ([place]) 12/18/2020  . Depression with anxiety 12/18/2020  . Essential hypertension 12/18/2020  . Isolated cervical dystonia 12/06/2016    [facility] 01/19/2021, 11:24 AM  [facility] [address] [place], [place], [address] Phone: [phone]   Fax:  [phone]  Name: Miguel [name] MRN: [id number] Date of Birth: 1956/06/11   Vianne Bulls, OTR/L [facility] [address] [place], [place]  [address] [phone] phone [phone] 01/19/21 11:24 AM

## 2021-01-20 ENCOUNTER — Other Ambulatory Visit (HOSPITAL_COMMUNITY): Payer: Self-pay

## 2021-01-20 MED ORDER — MIDODRINE HCL 5 MG PO TABS
ORAL_TABLET | ORAL | 1 refills | Status: DC
  Filled 2021-01-20: qty 90, 30d supply, fill #0
  Filled 2021-01-20: qty 270, 90d supply, fill #0

## 2021-01-20 NOTE — Telephone Encounter (Signed)
Wife notified and she will call Dr. Inda Merlin office.

## 2021-01-21 ENCOUNTER — Ambulatory Visit: Payer: BC Managed Care – PPO | Admitting: Physical Therapy

## 2021-01-21 ENCOUNTER — Other Ambulatory Visit: Payer: Self-pay

## 2021-01-21 ENCOUNTER — Encounter: Payer: Self-pay | Admitting: Physical Therapy

## 2021-01-21 VITALS — BP 143/82

## 2021-01-21 DIAGNOSIS — R41842 Visuospatial deficit: Secondary | ICD-10-CM | POA: Diagnosis not present

## 2021-01-21 DIAGNOSIS — R2689 Other abnormalities of gait and mobility: Secondary | ICD-10-CM

## 2021-01-21 DIAGNOSIS — R29818 Other symptoms and signs involving the nervous system: Secondary | ICD-10-CM | POA: Diagnosis not present

## 2021-01-21 DIAGNOSIS — R42 Dizziness and giddiness: Secondary | ICD-10-CM

## 2021-01-21 DIAGNOSIS — R260 Ataxic gait: Secondary | ICD-10-CM

## 2021-01-21 DIAGNOSIS — R270 Ataxia, unspecified: Secondary | ICD-10-CM | POA: Diagnosis not present

## 2021-01-21 DIAGNOSIS — R278 Other lack of coordination: Secondary | ICD-10-CM | POA: Diagnosis not present

## 2021-01-21 DIAGNOSIS — R2681 Unsteadiness on feet: Secondary | ICD-10-CM | POA: Diagnosis not present

## 2021-01-21 NOTE — Therapy (Signed)
[facility] [address] [place], [place], [address] Phone: [phone]   Fax:  [phone]  Physical Therapy Treatment  Patient Details  Name: Miguel [name] MRN: [id number] Date of Birth: 11/21/1955 Referring Provider (PT): Angiulli, Miguel Paganini, PA-C (will be followed by Dr. Letta Pate)   Encounter Date: 01/21/2021   PT End of Session - 01/21/21 0811    Visit Number 3    Number of Visits 19    Authorization Type BCBS - VL 30 for PT/OT (will restart on  03/04/21)    Authorization - Number of Visits 18    PT Start Time 0804    PT Stop Time 0845    PT Time Calculation (min) 41 min    Equipment Utilized During Treatment Gait belt    Activity Tolerance Patient tolerated treatment well    Behavior During Therapy [facility] for tasks assessed/performed           Past Medical History:  Diagnosis Date  . Cervical dystonia   . Depression   . Erectile dysfunction due to arterial insufficiency   . Functional dyspepsia   . Hypertension   . Pancreatitis 2005   likely secondary to alcohol use  . Rosacea     Past Surgical History:  Procedure Laterality Date  . no past surgery      Vitals:   01/21/21 0808 01/21/21 0822 01/21/21 0839  BP: 133/76 127/77 (!) 143/82     Subjective Assessment - 01/21/21 0808    Subjective No new complaitns. HEP is going well. PCP started him on BP med- Midodrine 5 mg 3x a day, to work on increasing BP/decreasing orthostasis. To clinic with TED hose and no binder.    Pertinent History [name]. history of cervical dystonia and receives Xeomin injections in the past followed by neurology service Dr. Evelena Leyden at [facility], hypertension, depression, tobacco and alcohol use    How long can you walk comfortably? farthest walk in hospital was 66' with RW.    Diagnostic tests MRI showed acute infarct left inferior posterior cerebellum.  Small acute infarct right thalamus.    Patient Stated  Goals ditch the RW - goes down to the cane, work on balance. work on [employer] and stairs.    Currently in Pain? No/denies    Pain Score 0-No pain                 [facility] Adult PT Treatment/Exercise - 01/21/21 0811      Transfers   Transfers Stand to Sit;Sit to Stand    Sit to Stand 5: Supervision;4: Min guard    Stand to Sit 5: Supervision;4: Min guard      Ambulation/Gait   Ambulation/Gait Yes    Ambulation/Gait Assistance 4: Min guard;4: Min assist    Ambulation/Gait Assistance Details cues for posture, to stay within the walker and for walker position with gait. Pt with impr    Ambulation Distance (Feet) 50 Feet   x2,   Assistive device Rolling walker    Gait Pattern Step-through pattern;Decreased stance time - left;Decreased weight shift to left;Scissoring;Ataxic;Narrow base of support;Lateral trunk lean to left    Ambulation Surface Level;Indoor      Berg Balance Test   Sit to Stand Able to stand without using hands and stabilize independently    Standing Unsupported Able to stand safely 2 minutes    Sitting with Back Unsupported but Feet Supported on Floor or Stool Able to sit safely and  securely 2 minutes    Stand to Sit Sits safely with minimal use of hands    Transfers Able to transfer with verbal cueing and /or supervision    Standing Unsupported with Eyes Closed Able to stand 10 seconds with supervision    Standing Ubsupported with Feet Together Needs help to attain position and unable to hold for 15 seconds   can attain position on own, then falls toward left   From Standing, Reach Forward with Outstretched Arm Can reach forward >12 cm safely (5")   8 inches   From Standing Position, Pick up Object from Floor Able to pick up shoe, needs supervision    From Standing Position, Turn to Look Behind Over each Shoulder Needs assist to keep from losing balance and falling   loss of balance toward right when looking   Turn 360 Degrees Needs close supervision or verbal  cueing    Standing Unsupported, Alternately Place Feet on Step/Stool Able to complete >2 steps/needs minimal assist    Standing Unsupported, One Foot in Front Able to take small step independently and hold 30 seconds    Standing on One Leg Tries to lift leg/unable to hold 3 seconds but remains standing independently    Total Score 32      Neuro Re-ed    Neuro Re-ed Details  for strengthening/balance/NMR: tall kneeling on mat table with UE support on Kaye bench- alternating UE raises for ~5 reps each side with cues to stay tall, then bil UE raises with 2# bal for 10 reps with continued cues for hip extension and equal LE weight bearing. with UE support on bench had pt perform mini squats with emphasis on equal LE weight bearing and full return to tall kneeling for 10 reps, then after a short rest had pt peform a second set of mini squats without UE support with continued cues needed. Once back in tall kneeling had pt work on hip movements alternaring laterally moving knee out/then back in for 10 reps each side, then alteranting moving knee backward/forward for 10 reps each side, all with UE suppot and cues to stay tall and cues/facilitaiton for weight shifting.                PT Short Term Goals - 01/18/21 1256      PT SHORT TERM GOAL #1   Title Pt will undergo further assessment of Berg balance test to determine fall risk with LTG written as appropriate. ALL STGS DUE 02/08/21.    Baseline was 23/56 when discharged from IPR    Time 3    Period Weeks    Status New    Target Date 02/08/21      PT SHORT TERM GOAL #2   Title Pt will be independent with initial HEP with wife's support as needed in order to build upon functional gains made in therapy.    Time 3    Period Weeks    Status New      PT SHORT TERM GOAL #3   Title Pt will ambulate 72' with RW with supervision in order to demo improved household mobility.    Time 3    Period Weeks    Status New      PT SHORT TERM GOAL #4    Title Pt will decr TUG time to 17.5 seconds or less with RW in order to demo decr fall risk.    Baseline 19.72 seconds    Time 3    Period  Weeks    Status New      PT SHORT TERM GOAL #5   Title Pt will ambulate up and down 8 stairs with min guard with use of R rail in order to safely go up and down the stairs in his house.    Time 3    Period Weeks    Status New             PT Long Term Goals - 01/18/21 1305      PT LONG TERM GOAL #1   Title Pt will be independent with initial HEP with wife's support as needed in order to build upon functional gains made in therapy. ALL LTGS DUE 03/01/21    Time 6    Period Weeks    Status New    Target Date 03/01/21      PT LONG TERM GOAL #2   Title BERG goal to be written as appropriate in order to demo decr fall risk.    Time 6    Period Weeks    Status New      PT LONG TERM GOAL #3   Title Pt will ambulate at least 100' over unlevel outdoor surfaces with RW with supervision in order to demo improved community mobility.    Time 6    Period Weeks    Status New      PT LONG TERM GOAL #4   Title Pt will improve FOTO score to 57 in order to demo improved functional outcomes.    Baseline 44    Time 6    Period Weeks    Status New      PT LONG TERM GOAL #5   Title Pt will decr TUG time to 16 seconds or less with RW  vs. LRAD in order to demo decr fall risk.    Baseline 19.72 seconds      Additional Long Term Goals   Additional Long Term Goals Yes      PT LONG TERM GOAL #6   Title Pt will improve gait speed to at least 2.1 ft/sec with RW vs. LRAD in order to demo decr fall risk and improved community mobility.    Baseline 1.84 ft/sec    Time 6    Period Weeks    Status New                 Plan - 01/21/21 4332    Clinical Impression Statement Pt with improved BP's today, not getting orthostatic with standing since starting new medication. Today's skilled session initially focused on performance of Berg Balance Test with  pt's intial score being 32/56 (was 23/56 when last assessed on CIR). Primary PT to set goals. Remainder of session focused on strengthening/NMR in tall kneeling with min guard to min assist with cues/faciliation needed for posture/weight shifting with activities performed. The pt is making progress with improved BP and should benefit from continued PT to progress toward unmet goals.    Personal Factors and Comorbidities Comorbidity 3+;Past/Current Experience    Comorbidities Cervical dystonia, HTN, depression, anxiety.    Examination-Activity Limitations Stairs;Stand;Squat;Locomotion Level;Transfers;Bend    Examination-Participation Restrictions Community Activity;Driving;Yard Work;Occupation    Stability/Clinical Decision Making Evolving/Moderate complexity    Rehab Potential Good    PT Frequency 3x / week   pt's insurance re-starts at beginning of July and has a 30 VL between PT/OT   PT Duration 6 weeks    PT Treatment/Interventions ADLs/Self Care Home Management;Stair training;Gait training;DME Instruction;Functional mobility  training;Therapeutic activities;Therapeutic exercise;Neuromuscular re-education;Balance training;Patient/family education;Vestibular    PT Next Visit Plan monitor BP- not as orthostatic, has potential to elevate with new medication; gait training with RW. work on weight bearing activities due to ataxia.    PT Home Exercise Plan Access Code [email or web address]    Consulted and Agree with Plan of Care Patient           Patient will benefit from skilled therapeutic intervention in order to improve the following deficits and impairments:  Abnormal gait,Decreased coordination,Decreased balance,Decreased safety awareness,Decreased strength,Difficulty walking,Dizziness,Decreased activity tolerance  Visit Diagnosis: Unsteadiness on feet  Other abnormalities of gait and mobility  Ataxic gait  Dizziness and giddiness  Other symptoms and signs involving the nervous  system     Problem List Patient Active Problem List   Diagnosis Date Noted  . Cerebrovascular accident (CVA) of right thalamus (HCC) 12/31/2020  . Cerebral infarction due to embolism of left cerebellar artery (HCC) 12/31/2020  . Alcohol use 12/19/2020  . Acute CVA (cerebrovascular accident) (HCC) 12/18/2020  . Depression with anxiety 12/18/2020  . Essential hypertension 12/18/2020  . Isolated cervical dystonia 12/06/2016    Sallyanne Kuster, PTA, [facility] [address], [address] [place], [place] [address] [phone] 01/21/21, 11:36 AM   Name: Miguel [name] MRN: [id number] Date of Birth: 1955-09-08

## 2021-01-25 ENCOUNTER — Ambulatory Visit: Payer: BC Managed Care – PPO

## 2021-01-25 ENCOUNTER — Other Ambulatory Visit: Payer: Self-pay

## 2021-01-25 ENCOUNTER — Encounter: Payer: Self-pay | Admitting: Occupational Therapy

## 2021-01-25 ENCOUNTER — Ambulatory Visit: Payer: BC Managed Care – PPO | Admitting: Occupational Therapy

## 2021-01-25 VITALS — BP 97/71 | HR 95

## 2021-01-25 DIAGNOSIS — R278 Other lack of coordination: Secondary | ICD-10-CM

## 2021-01-25 DIAGNOSIS — R42 Dizziness and giddiness: Secondary | ICD-10-CM | POA: Diagnosis not present

## 2021-01-25 DIAGNOSIS — R2681 Unsteadiness on feet: Secondary | ICD-10-CM

## 2021-01-25 DIAGNOSIS — R2689 Other abnormalities of gait and mobility: Secondary | ICD-10-CM | POA: Diagnosis not present

## 2021-01-25 DIAGNOSIS — R260 Ataxic gait: Secondary | ICD-10-CM

## 2021-01-25 DIAGNOSIS — R270 Ataxia, unspecified: Secondary | ICD-10-CM

## 2021-01-25 DIAGNOSIS — R29818 Other symptoms and signs involving the nervous system: Secondary | ICD-10-CM | POA: Diagnosis not present

## 2021-01-25 DIAGNOSIS — R41842 Visuospatial deficit: Secondary | ICD-10-CM

## 2021-01-25 NOTE — Therapy (Signed)
[facility] [address] [place], [place], [address] Phone: [phone]   Fax:  [phone]  Physical Therapy Treatment  Patient Details  Name: Miguel [name] MRN: [id number] Date of Birth: July 17, 1956 Referring Provider (PT): Angiulli, Lavon Paganini, PA-C (will be followed by Dr. Letta Pate)   Encounter Date: 01/25/2021   PT End of Session - 01/25/21 0931    Visit Number 4    Number of Visits 19    Authorization Type BCBS - VL 30 for PT/OT (will restart on  03/04/21)    Authorization - Number of Visits 18    PT Start Time 0931    PT Stop Time 1015    PT Time Calculation (min) 44 min    Equipment Utilized During Treatment Gait belt    Activity Tolerance Patient tolerated treatment well    Behavior During Therapy [facility] for tasks assessed/performed           Past Medical History:  Diagnosis Date  . Cervical dystonia   . Depression   . Erectile dysfunction due to arterial insufficiency   . Functional dyspepsia   . Hypertension   . Pancreatitis 2005   likely secondary to alcohol use  . Rosacea     Past Surgical History:  Procedure Laterality Date  . no past surgery      Vitals:   01/25/21 0941 01/25/21 0944  BP: 114/73 97/71  Pulse: 86 95     Subjective Assessment - 01/25/21 0931    Subjective Patient reports continues to have blurry vision and light sensitivity. Has cut the Midodrine to a half tablet, 2x a day. Continues to wear TED hose. Is not wearing the abdominal binder. No pain. No falls to report, but did have one instance of having to go down onto the left knee due to dizziness. No other new changes/complaints.    Pertinent History [name]. history of cervical dystonia and receives Xeomin injections in the past followed by neurology service Dr. Evelena [name] at [facility], hypertension, depression, tobacco and alcohol use    How long can you walk comfortably? farthest walk in hospital was 56' with  RW.    Diagnostic tests MRI showed acute infarct left inferior posterior cerebellum.  Small acute infarct right thalamus.    Patient Stated Goals ditch the RW - goes down to the cane, work on balance. work on [employer] and stairs.    Currently in Pain? No/denies             [facility] Adult PT Treatment/Exercise - 01/25/21 0001      Transfers   Transfers Stand to Sit;Sit to Stand    Sit to Stand 5: Supervision;4: Min guard    Stand to Sit 5: Supervision;4: Min guard      Ambulation/Gait   Ambulation/Gait Yes    Ambulation/Gait Assistance 4: Min guard;4: Min assist    Ambulation/Gait Assistance Details continued to provide cues for walker position, and cues for slowed control with ambulation, 1-2 instances ambulating into session require min A to maintain balance.    Ambulation Distance (Feet) --   clinic distance   Assistive device Rolling walker    Gait Pattern Step-through pattern;Decreased stance time - left;Decreased weight shift to left;Scissoring;Ataxic;Narrow base of support;Lateral trunk lean to left    Ambulation Surface Level;Indoor      Neuro Re-ed    Neuro Re-ed Details  for strengthening/balance/NMR: completed tall kneeling on mat table with UE support on bench intermittently. completed  BUE raises x 10 reps with 2# weighted ball, then progressed to lift/chops with weighted ball x 10 reps each direction, cues to maintain equal weight shift throughout. Completed hip hinges without UE support with PT providing faciliation for equal weight shift x 10 reps. Patient tolerating well. BP: 139/87, HR: 95 after completion.            Balance Exercises - 01/25/21 0001      Balance Exercises: Standing   Standing Eyes Opened Narrow base of support (BOS);Solid surface;20 secs;4 reps;Limitations    Standing Eyes Opened Limitations completed standing with narrow BOS (1-2 inches between feet), require UE support to maintain position then removing UE support and maintaining 4 x 20-25  seconds, intermittent leaning to L require cues and CGA intermittent.    SLS with Vectors Solid surface;Intermittent upper extremity assist;3 reps;Limitations    SLS with Vectors Limitations standing with RW placed in front and 4" step completed alternating toe taps to step, initially starting with BUE support progressing to single UE support then to no UE support, completed x 8 reps each. Patient doing well without UE support and CGA today with cues for slowed pace and control, and intermittent cues to maintain feet hip width vs. narrow BOS. Second set x 10 reps completd without UE support, CGA. BP: 132/85, HR: 96 after completion. intermittent rest breaks required.               PT Short Term Goals - 01/18/21 1256      PT SHORT TERM GOAL #1   Title Pt will undergo further assessment of Berg balance test to determine fall risk with LTG written as appropriate. ALL STGS DUE 02/08/21.    Baseline was 23/56 when discharged from IPR    Time 3    Period Weeks    Status New    Target Date 02/08/21      PT SHORT TERM GOAL #2   Title Pt will be independent with initial HEP with wife's support as needed in order to build upon functional gains made in therapy.    Time 3    Period Weeks    Status New      PT SHORT TERM GOAL #3   Title Pt will ambulate 44' with RW with supervision in order to demo improved household mobility.    Time 3    Period Weeks    Status New      PT SHORT TERM GOAL #4   Title Pt will decr TUG time to 17.5 seconds or less with RW in order to demo decr fall risk.    Baseline 19.72 seconds    Time 3    Period Weeks    Status New      PT SHORT TERM GOAL #5   Title Pt will ambulate up and down 8 stairs with min guard with use of R rail in order to safely go up and down the stairs in his house.    Time 3    Period Weeks    Status New             PT Long Term Goals - 01/18/21 1305      PT LONG TERM GOAL #1   Title Pt will be independent with initial HEP with  wife's support as needed in order to build upon functional gains made in therapy. ALL LTGS DUE 03/01/21    Time 6    Period Weeks    Status New  Target Date 03/01/21      PT LONG TERM GOAL #2   Title BERG goal to be written as appropriate in order to demo decr fall risk.    Time 6    Period Weeks    Status New      PT LONG TERM GOAL #3   Title Pt will ambulate at least 100' over unlevel outdoor surfaces with RW with supervision in order to demo improved community mobility.    Time 6    Period Weeks    Status New      PT LONG TERM GOAL #4   Title Pt will improve FOTO score to 57 in order to demo improved functional outcomes.    Baseline 44    Time 6    Period Weeks    Status New      PT LONG TERM GOAL #5   Title Pt will decr TUG time to 16 seconds or less with RW  vs. LRAD in order to demo decr fall risk.    Baseline 19.72 seconds      Additional Long Term Goals   Additional Long Term Goals Yes      PT LONG TERM GOAL #6   Title Pt will improve gait speed to at least 2.1 ft/sec with RW vs. LRAD in order to demo decr fall risk and improved community mobility.    Baseline 1.84 ft/sec    Time 6    Period Weeks    Status New                 Plan - 01/25/21 1209    Clinical Impression Statement Patient having orthostatic upon standing at start of session, required extended rest break. No ambulation during session completed due to this. Continued NMR in tall kneeling, with patient tolerating well overall CGA. Initiated balance activiteis focused on SLS and narrow BOS, with increased challenge without UE support but able to complete with CGA. Continued to monitor BP throughout session, vitals stable. Will continue to progress toward all LTGs.    Personal Factors and Comorbidities Comorbidity 3+;Past/Current Experience    Comorbidities Cervical dystonia, HTN, depression, anxiety.    Examination-Activity Limitations Stairs;Stand;Squat;Locomotion Level;Transfers;Bend     Examination-Participation Restrictions Community Activity;Driving;Yard Work;Occupation    Stability/Clinical Decision Making Evolving/Moderate complexity    Rehab Potential Good    PT Frequency 3x / week   pt's insurance re-starts at beginning of July and has a 30 VL between PT/OT   PT Duration 6 weeks    PT Treatment/Interventions ADLs/Self Care Home Management;Stair training;Gait training;DME Instruction;Functional mobility training;Therapeutic activities;Therapeutic exercise;Neuromuscular re-education;Balance training;Patient/family education;Vestibular    PT Next Visit Plan continue to monitor BP- not as orthostatic, has potential to elevate with new medication; return to use of abdominal binder during sessions? continue gait training with RW. work on weight bearing activities due to ataxia, balance exerices.    PT Home Exercise Plan Access Code [email or web address]    Consulted and Agree with Plan of Care Patient           Patient will benefit from skilled therapeutic intervention in order to improve the following deficits and impairments:  Abnormal gait,Decreased coordination,Decreased balance,Decreased safety awareness,Decreased strength,Difficulty walking,Dizziness,Decreased activity tolerance  Visit Diagnosis: Unsteadiness on feet  Other abnormalities of gait and mobility  Ataxic gait  Dizziness and giddiness     Problem List Patient Active Problem List   Diagnosis Date Noted  . Cerebrovascular accident (CVA) of right thalamus ([place]) 12/31/2020  . Cerebral infarction  due to embolism of left cerebellar artery ([place]) 12/31/2020  . Alcohol use 12/19/2020  . Acute CVA (cerebrovascular accident) ([place]) 12/18/2020  . Depression with anxiety 12/18/2020  . Essential hypertension 12/18/2020  . Isolated cervical dystonia 12/06/2016    Jones Bales, PT, DPT 01/25/2021, 12:12 PM  [facility] [address] [place], [place],  [address] Phone: [phone]   Fax:  [phone]  Name: Miguel [name] MRN: [id number] Date of Birth: October 08, 1955

## 2021-01-25 NOTE — Therapy (Signed)
[facility] [address] [place], [place], [address] Phone: [phone]   Fax:  [phone]  Occupational Therapy Treatment  Patient Details  Name: Miguel [name] MRN: [id number] Date of Birth: 1956-02-18 Referring Provider (OT): Dr. Alysia Penna   Encounter Date: 01/25/2021   OT End of Session - 01/25/21 1006    Visit Number 3    Number of Visits 12    Date for OT Re-Evaluation 03/04/21    Authorization Type BCBS, 30 visit limit combined OT/PT.  Wife reports visit limits re-start 03/05/21.    Authorization - Visit Number 3    Authorization - Number of Visits 12   PT anticipates using 18 visits prior to 03/04/21)   OT Start Time 1017    OT Stop Time 1100    OT Time Calculation (min) 43 min    Equipment Utilized During Treatment **FOTO**    Activity Tolerance Patient tolerated treatment well    Behavior During Therapy WFL for tasks assessed/performed           Past Medical History:  Diagnosis Date  . Cervical dystonia   . Depression   . Erectile dysfunction due to arterial insufficiency   . Functional dyspepsia   . Hypertension   . Pancreatitis 2005   likely secondary to alcohol use  . Rosacea     Past Surgical History:  Procedure Laterality Date  . no past surgery      There were no vitals filed for this visit.   Subjective Assessment - 01/25/21 1006    Subjective  pt reports some light sensitivity with lighting changes, but recovers quickly (stops and waits).  Had a "sit down episode" from car>garage.  Pt started medication for BP last week.    Patient is accompanied by: Family member   [name]--wife   Pertinent History CVA  12/16/20.  PMH:  Cervical dystonia, HTN, depression, anxiety, orthostatic hypotension    Limitations orthostatic hypotension, monitor BP, fall risk, abdominal binder prn    Patient Stated Goals to be able to walk without a walker, do more at home (improve balance, be able to carry  items, incr independence).    Currently in Pain? No/denies            BP 104/65 at beginning of session with HR 90  (after PT), dropped to 90/62 (HR 104) with standing and pt began feeling lightheaded/dizzy/like he was going to pass out so sat back down.  Reviewed green theraband HEP from hospital in sitting and pt returned demo each x20 with each UE:  Bicep curls, overhead horizontal abduction, tricep ext/punching forward with each UE alternating, and shoulder ext.    Sitting, performed alternating UE to opposite LE touches for incr activity and core stability x20.  Rechecked BP at 103/75 (HR 91) and then 109/69 in standing (HR 94).   Standing and performing functional reaching overhead and laterally to each side with trunk rotation/wt. Shift with 2/10 dizziness reported for incr standing balance and activity tolerance, and for LB ADLs.  Gave pt water during session and recommended that pt bring binder to therapy so that it can be utilized prn.  Also recommended pt perform ankle pumps/marching in sitting prior to standing particularly in locations that pt identifies as more difficult with lighting changes, sit>stand, and turns such as hallway going to restroom and getting out of car in garage and incr overall water intake to help with BP. Per wife report (as she is keeping recording of  BP, pt fluctuates greatly with and without symptoms).   Arm bike x58min (forward/backward) without rest, level 8 for conditioning.  BP 145/79 after exercise but dropped significantly (orthostatic) after standing, but reports no symptoms.  After standing approx 4-59min BP 111/74 (HR 96) with no symptoms.  Walked with pt/wife to lobby.         OT Short Term Goals - 01/18/21 1150      OT SHORT TERM GOAL #1   Title Pt will  be independent with updated HEP (BUE coordination and LUE strength).--check STGs 02/17/21.    Time 4    Period Weeks    Status New      OT SHORT TERM GOAL #2   Title Pt will perform BADLs  except bathing mod I.    Time 4    Period Weeks    Status New      OT SHORT TERM GOAL #3   Title Pt will be able to perform simple home maintenance task in standing with supervision.    Time 4    Period Weeks    Status New             OT Long Term Goals - 01/18/21 1157      OT LONG TERM GOAL #1   Title Pt will perform simple meal prep task with supervision.    Time 6    Period Weeks    Status New      OT LONG TERM GOAL #2   Title Pt will perform simple home maintenance task mod I.    Time 6    Period Weeks    Status New      OT LONG TERM GOAL #3   Title Pt will peform environmental scanning/navigation in busy environment mod I without LOB or dizziness for community activities and incr safety    Time 6    Period Weeks    Status New      OT LONG TERM GOAL #4   Title Pt will verbalize understanding of AE/strategies for incr safety/independence with ADLs/IADLs.    Time 6    Period Weeks    Status New      OT LONG TERM GOAL #5   Title Pt will improve UE functional use, strength, and coordination as shown by improving FOTO score to at least 62.    Time 6    Period Weeks    Status New                 Plan - 01/25/21 1007    Clinical Impression Statement Pt continues to need close monitoring of BP as pt continues to experience drops in BP with position changes/orthostatic hypotension and low BP which is a barrier to activity/progress.  Pt demo incr difficulty with balance with low BP at times or feels like he will "pass out."  Pt/wife continue to monitor at home and work with MD regarding medication.    OT Occupational Profile and History Detailed Assessment- Review of Records and additional review of physical, cognitive, psychosocial history related to current functional performance    Occupational performance deficits (Please refer to evaluation for details): ADL's;IADL's;Work;Leisure    Body Structure / Function / Physical Skills ADL;Balance;Dexterity;Decreased  knowledge of use of DME;Strength;GMC;UE functional use;IADL;Coordination;Mobility;FMC    Rehab Potential Good    Clinical Decision Making Several treatment options, min-mod task modification necessary    Comorbidities Affecting Occupational Performance: May have comorbidities impacting occupational performance    Modification or Assistance  to Complete Evaluation  Min-Moderate modification of tasks or assist with assess necessary to complete eval    OT Frequency 2x / week    OT Duration 6 weeks   +eval.  Wife reports that insurace visit limits restart 03/05/21; Will reassess need for further occupational therapy after 6 weeks.   OT Treatment/Interventions Self-care/ADL training;Moist Heat;DME and/or AE instruction;Balance training;Therapeutic activities;Aquatic Therapy;Therapeutic exercise;Visual/perceptual remediation/compensation;Functional Mobility Training;Neuromuscular education;Cryotherapy;Energy conservation;Patient/family education;Manual Therapy    Plan check BP (orthostatic); standing balance, functional reaching, coordination    Consulted and Agree with Plan of Care Patient;Family member/caregiver    Family Member Consulted wife           Patient will benefit from skilled therapeutic intervention in order to improve the following deficits and impairments:   Body Structure / Function / Physical Skills: ADL,Balance,Dexterity,Decreased knowledge of use of DME,Strength,GMC,UE functional use,IADL,Coordination,Mobility,FMC       Visit Diagnosis: Unsteadiness on feet  Other abnormalities of gait and mobility  Visuospatial deficit  Ataxia  Other lack of coordination    Problem List Patient Active Problem List   Diagnosis Date Noted  . Cerebrovascular accident (CVA) of right thalamus ([place]) 12/31/2020  . Cerebral infarction due to embolism of left cerebellar artery ([place]) 12/31/2020  . Alcohol use 12/19/2020  . Acute CVA (cerebrovascular accident) ([place]) 12/18/2020  . Depression  with anxiety 12/18/2020  . Essential hypertension 12/18/2020  . Isolated cervical dystonia 12/06/2016    [facility] 01/25/2021, 11:26 AM  [facility] [address] [place], [place], [address] Phone: [phone]   Fax:  [phone]  Name: Dreshon Proffit MRN: [id number] Date of Birth: 06/13/1956   Vianne Bulls, OTR/L [facility] [address] [place], [place]  [address] [phone] phone [phone] 01/25/21 11:26 AM

## 2021-01-27 ENCOUNTER — Encounter: Payer: Self-pay | Admitting: Physical Therapy

## 2021-01-27 ENCOUNTER — Encounter: Payer: Self-pay | Admitting: Registered Nurse

## 2021-01-27 ENCOUNTER — Ambulatory Visit: Payer: BC Managed Care – PPO | Admitting: Physical Therapy

## 2021-01-27 ENCOUNTER — Other Ambulatory Visit: Payer: Self-pay

## 2021-01-27 ENCOUNTER — Encounter: Payer: BC Managed Care – PPO | Attending: Registered Nurse | Admitting: Registered Nurse

## 2021-01-27 VITALS — BP 107/73 | HR 81 | Temp 98.4°F | Ht 72.0 in | Wt 195.0 lb

## 2021-01-27 VITALS — BP 132/82 | HR 85

## 2021-01-27 DIAGNOSIS — R278 Other lack of coordination: Secondary | ICD-10-CM

## 2021-01-27 DIAGNOSIS — I639 Cerebral infarction, unspecified: Secondary | ICD-10-CM | POA: Diagnosis not present

## 2021-01-27 DIAGNOSIS — R260 Ataxic gait: Secondary | ICD-10-CM | POA: Diagnosis not present

## 2021-01-27 DIAGNOSIS — F418 Other specified anxiety disorders: Secondary | ICD-10-CM | POA: Diagnosis not present

## 2021-01-27 DIAGNOSIS — R2681 Unsteadiness on feet: Secondary | ICD-10-CM

## 2021-01-27 DIAGNOSIS — I6381 Other cerebral infarction due to occlusion or stenosis of small artery: Secondary | ICD-10-CM

## 2021-01-27 DIAGNOSIS — R270 Ataxia, unspecified: Secondary | ICD-10-CM | POA: Diagnosis not present

## 2021-01-27 DIAGNOSIS — R42 Dizziness and giddiness: Secondary | ICD-10-CM | POA: Diagnosis not present

## 2021-01-27 DIAGNOSIS — I10 Essential (primary) hypertension: Secondary | ICD-10-CM

## 2021-01-27 DIAGNOSIS — R29818 Other symptoms and signs involving the nervous system: Secondary | ICD-10-CM | POA: Diagnosis not present

## 2021-01-27 DIAGNOSIS — R41842 Visuospatial deficit: Secondary | ICD-10-CM | POA: Diagnosis not present

## 2021-01-27 DIAGNOSIS — R2689 Other abnormalities of gait and mobility: Secondary | ICD-10-CM

## 2021-01-27 NOTE — Therapy (Signed)
[facility] [address] [place], [place], [address] Phone: [phone]   Fax:  [phone]  Physical Therapy Treatment  Patient Details  Name: Alexxander Kurt MRN: [id number] Date of Birth: 1955-10-28 Referring Provider (PT): Angiulli, Lavon Paganini, PA-C (will be followed by Dr. Letta Pate)   Encounter Date: 01/27/2021   PT End of Session - 01/27/21 1706    Visit Number 5    Number of Visits 19    Authorization Type BCBS - VL 30 for PT/OT (will restart on  03/04/21)    Authorization - Visit Number 4    Authorization - Number of Visits 18    PT Start Time 3267    PT Stop Time 1314    PT Time Calculation (min) 43 min    Equipment Utilized During Treatment Gait belt    Activity Tolerance Patient tolerated treatment well    Behavior During Therapy WFL for tasks assessed/performed           Past Medical History:  Diagnosis Date  . Cervical dystonia   . Depression   . Erectile dysfunction due to arterial insufficiency   . Functional dyspepsia   . Hypertension   . Pancreatitis 2005   likely secondary to alcohol use  . Rosacea     Past Surgical History:  Procedure Laterality Date  . no past surgery      Vitals:   01/27/21 1237 01/27/21 1245 01/27/21 1301  BP: 136/80 131/80 132/82  Pulse: 77 88 85     Subjective Assessment - 01/27/21 1234    Subjective Just took a Midodrine before he got to therapy. Walking is getting better. Exercises are going well at home.    Pertinent History [name]. history of cervical dystonia and receives Xeomin injections in the past followed by neurology service Dr. Evelena Leyden at [facility], hypertension, depression, tobacco and alcohol use    How long can you walk comfortably? farthest walk in hospital was 52' with RW.    Diagnostic tests MRI showed acute infarct left inferior posterior cerebellum.  Small acute infarct right thalamus.    Patient Stated Goals ditch the RW -  goes down to the cane, work on balance. work on [employer] and stairs.    Currently in Pain? No/denies                             [facility] Adult PT Treatment/Exercise - 01/27/21 1250      Transfers   Transfers Stand to Sit;Sit to Stand    Sit to Stand 5: Supervision;4: Min guard    Stand to Sit 5: Supervision;4: Min guard    Comments x5 reps staggered stance sit <> stands with LLE posteriorly using UE suppot to stand and none when lowering, cues for slowed and controlled      Ambulation/Gait   Ambulation/Gait Yes    Ambulation/Gait Assistance 4: Min guard;4: Min assist    Ambulation/Gait Assistance Details w/c follow for safety (not needed), cues for slowed pace and wider BOS, needing min A when going around turns due to leaning to the L and more narrow BOS, no episodes of scissoring today    Ambulation Distance (Feet) 230 Feet    Assistive device Rolling walker    Gait Pattern Step-through pattern;Decreased stance time - left;Decreased weight shift to left;Scissoring;Ataxic;Narrow base of support;Lateral trunk lean to left    Ambulation Surface Level;Indoor  Neuro Re-ed    Neuro Re-ed Details  NMR: standing at RW - wide BOS gentle lateral weight shifting with no UE support x10 reps B - use of mirror as visual cue for weight shift , feet hip width distance 2 x 5 reps head turns, 2 x 5 reps head nods - min guard for balance, incr difficulty with head turns, x12 reps B staggered stance A/P weight shifting with BUE support on RW, verbal and demo cues for proper technique and slowed pace . in quadraped: lifting RLE and LUE x5 reps,, 2 x 5 reps B alternating leg lifts - cues for proper weight shifting and hip alignment, throughout cues for pt to breathe to avoid holding his breath. Took a seated break between each rep of 5                    PT Short Term Goals - 01/18/21 1256      PT SHORT TERM GOAL #1   Title Pt will undergo further assessment of Berg  balance test to determine fall risk with LTG written as appropriate. ALL STGS DUE 02/08/21.    Baseline was 23/56 when discharged from I[facility]    Time 3    Period Weeks    Status New    Target Date 02/08/21      PT SHORT TERM GOAL #2   Title Pt will be independent with initial HEP with wife's support as needed in order to build upon functional gains made in therapy.    Time 3    Period Weeks    Status New      PT SHORT TERM GOAL #3   Title Pt will ambulate 52' with RW with supervision in order to demo improved household mobility.    Time 3    Period Weeks    Status New      PT SHORT TERM GOAL #4   Title Pt will decr TUG time to 17.5 seconds or less with RW in order to demo decr fall risk.    Baseline 19.72 seconds    Time 3    Period Weeks    Status New      PT SHORT TERM GOAL #5   Title Pt will ambulate up and down 8 stairs with min guard with use of R rail in order to safely go up and down the stairs in his house.    Time 3    Period Weeks    Status New             PT Long Term Goals - 01/18/21 1305      PT LONG TERM GOAL #1   Title Pt will be independent with initial HEP with wife's support as needed in order to build upon functional gains made in therapy. ALL LTGS DUE 03/01/21    Time 6    Period Weeks    Status New    Target Date 03/01/21      PT LONG TERM GOAL #2   Title BERG goal to be written as appropriate in order to demo decr fall risk.    Time 6    Period Weeks    Status New      PT LONG TERM GOAL #3   Title Pt will ambulate at least 100' over unlevel outdoor surfaces with RW with supervision in order to demo improved community mobility.    Time 6    Period Weeks    Status New  PT LONG TERM GOAL #4   Title Pt will improve FOTO score to 57 in order to demo improved functional outcomes.    Baseline 44    Time 6    Period Weeks    Status New      PT LONG TERM GOAL #5   Title Pt will decr TUG time to 16 seconds or less with RW  vs. LRAD in order  to demo decr fall risk.    Baseline 19.72 seconds      Additional Long Term Goals   Additional Long Term Goals Yes      PT LONG TERM GOAL #6   Title Pt will improve gait speed to at least 2.1 ft/sec with RW vs. LRAD in order to demo decr fall risk and improved community mobility.    Baseline 1.84 ft/sec    Time 6    Period Weeks    Status New                 Plan - 01/27/21 1707    Clinical Impression Statement Pt with stable BP throughout session after standing activity and gait (see vitals above). Continued standing balance with working on weight shifting and head motions in static stance. Ambulated 230' today with RW with pt needing cues for slowed pace. Pt tends to lean to the L, esp going around curves, needing min A at times. Will continue to progress towards LTGs.    Personal Factors and Comorbidities Comorbidity 3+;Past/Current Experience    Comorbidities Cervical dystonia, HTN, depression, anxiety.    Examination-Activity Limitations Stairs;Stand;Squat;Locomotion Level;Transfers;Bend    Examination-Participation Restrictions Community Activity;Driving;Yard Work;Occupation    Stability/Clinical Decision Making Evolving/Moderate complexity    Rehab Potential Good    PT Frequency 3x / week   pt's insurance re-starts at beginning of July and has a 30 VL between PT/OT   PT Duration 6 weeks    PT Treatment/Interventions ADLs/Self Care Home Management;Stair training;Gait training;DME Instruction;Functional mobility training;Therapeutic activities;Therapeutic exercise;Neuromuscular re-education;Balance training;Patient/family education;Vestibular    PT Next Visit Plan continue to monitor BP- not as orthostatic! continue gait training with RW. work on weight bearing activities due to ataxia, balance exericses in standing    PT Home Exercise Plan Access Code [place] and Agree with Plan of Care Patient           Patient will benefit from skilled therapeutic  intervention in order to improve the following deficits and impairments:  Abnormal gait,Decreased coordination,Decreased balance,Decreased safety awareness,Decreased strength,Difficulty walking,Dizziness,Decreased activity tolerance  Visit Diagnosis: Unsteadiness on feet  Other abnormalities of gait and mobility  Other lack of coordination     Problem List Patient Active Problem List   Diagnosis Date Noted  . Cerebrovascular accident (CVA) of right thalamus ([place]) 12/31/2020  . Cerebral infarction due to embolism of left cerebellar artery ([place]) 12/31/2020  . Alcohol use 12/19/2020  . Acute CVA (cerebrovascular accident) ([place]) 12/18/2020  . Depression with anxiety 12/18/2020  . Essential hypertension 12/18/2020  . Isolated cervical dystonia 12/06/2016    Arliss Journey, PT, DPT  01/27/2021, 5:13 PM  [facility] [address] [place], [place], [address] Phone: [phone]   Fax:  [phone]  Name: Chandlor Noecker MRN: [id number] Date of Birth: 17-Mar-1956

## 2021-01-27 NOTE — Progress Notes (Signed)
Subjective:    Patient ID: Miguel [name], male    DOB: December 25, 1955, 65 y.o.   MRN: [id number]  HPI : Miguel [name] is a 65 y.o. male who is here for Transitional Care Visit for the following Cerebrovascular accident (CVA) of right thalamus, Essential Hypertension and Depression with Anxiety. Miguel [name] presented to [facility] on 12/18/2020 with complaints of persistent dizziness and left sided ataxia. He also reports on 12/16/2020 while riding his lawnmower and struck a tree without loss of consciousness. Neurology Consulted.  CT: Head: WO Contrast:  IMPRESSION: 1.  No acute traumatic injury identified. 2. Largely normal for age noncontrast CT appearance of the brain. But subtle asymmetric hypodensity in the central left cerebellum might be the sequelae of small vessel ischemia. 3. Mild paranasal sinus inflammation.  CT: Cervical Spine:  IMPRESSION: 1. No acute traumatic injury identified in the cervical spine. 2. Ankylosis of C2-C3 since 2006. Advanced cervical disc and endplate degeneration elsewhere, with subsequent multilevel mild spinal stenosis suspected.  MR: Brain:  IMPRESSION: Acute infarct left inferior and posterior cerebellum. Small acute infarct right thalamus.  CT Angio: Head and Neck:  IMPRESSION: 1. Moderate to severe stenosis origin of left vertebral artery. Occlusion distal left vertebral artery at C1 which appears acute. Left PICA not visualized. Patient has acute left PICA infarct on MRI. 2. Atherosclerotic disease in the carotid bifurcation bilaterally. 50% diameter stenosis proximal right internal carotid artery. No significant left carotid stenosis.  Miguel [name] was maintained on Plavix for CVA prophylaxis and Pleta x 3 weeks then Plavix alone.   Miguel [name] was admitted to inpatient rehabilitation on 12/31/2020, and discharged home on 01/14/2021. He denies any pain. He rates his pain 0. He is going to [facility] for Outpatient Therapy. Also report  he has  A good appetite.   Miguel [name] Morphine equivalent is 3.50 MME. He is also prescribed clonazepam   by Dr. Inda Merlin. We have discussed the black box warning of using opioids and benzodiazepines. I highlighted the dangers of using these drugs together and discussed the adverse events including respiratory suppression, overdose, cognitive impairment and importance of compliance with current regimen. We will continue to monitor and adjust as indicated.    Pain Inventory Average Pain 1 Pain Right Now 0 My pain is intermittent, sharp and aching  LOCATION OF PAIN  Neck, hip, toes, rib(chest)  BOWEL Number of stools per week: 5-7 Oral laxative use Yes  Type of laxative colace, miralax Enema or suppository use No  History of colostomy No  Incontinent No   BLADDER Normal In and out cath, frequency n/a Able to self cath n/a Bladder incontinence No  Frequent urination No  Leakage with coughing No  Difficulty starting stream No  Incomplete bladder emptying No    Mobility walk with assistance use a walker how many minutes can you walk? 10 ability to climb steps?  no do you drive?  no use a wheelchair needs help with transfers Do you have any goals in this area?  yes  Function not employed: date last employed 11/25/2020 disabled: date disabled 12/18/2020 I need assistance with the following:  dressing, bathing, toileting, meal prep, household duties and shopping Do you have any goals in this area?  yes  Neuro/Psych weakness numbness trouble walking dizziness depression anxiety  Prior Studies transitions of care  Physicians involved in your care transitions of care   Family History  Problem Relation Age of Onset  . Uterine cancer Mother   . Emphysema  Father    Social History   Socioeconomic History  . Marital status: Married    Spouse name: Not on file  . Number of children: 0  . Years of education: some college  . Highest education level: Not on file   Occupational History  . Occupation: Associate Professor  Tobacco Use  . Smoking status: Former Research scientist (life sciences)  . Smokeless tobacco: Never Used  Substance and Sexual Activity  . Alcohol use: Yes    Comment: 3 beers per day  . Drug use: No  . Sexual activity: Not on file  Other Topics Concern  . Not on file  Social History Narrative   Lives at home with his wife.   3 cups of caffeine day.   Right-handed.   Social Determinants of Health   Financial Resource Strain: Not on file  Food Insecurity: Not on file  Transportation Needs: Not on file  Physical Activity: Not on file  Stress: Not on file  Social Connections: Not on file   Past Surgical History:  Procedure Laterality Date  . no past surgery     Past Medical History:  Diagnosis Date  . Cervical dystonia   . Depression   . Erectile dysfunction due to arterial insufficiency   . Functional dyspepsia   . Hypertension   . Pancreatitis 2005   likely secondary to alcohol use  . Rosacea    BP 107/73   Pulse 81   Temp 98.4 F (36.9 C)   Ht 6' (1.829 m)   Wt 195 lb (88.5 kg)   SpO2 96%   BMI 26.45 kg/m   Opioid Risk Score:   Fall Risk Score:  `1  Depression screen PHQ 2/9  No flowsheet data found.  Review of Systems  Constitutional: Positive for unexpected weight change.  Eyes: Negative.   Cardiovascular: Negative.   Gastrointestinal: Negative.   Endocrine: Negative.   Musculoskeletal: Positive for arthralgias, back pain and gait problem.  Skin: Negative.   Allergic/Immunologic: Negative.   Neurological: Positive for dizziness, weakness and numbness.  Psychiatric/Behavioral: Positive for agitation and dysphoric mood. The patient is nervous/anxious.   All other systems reviewed and are negative.      Objective:   Physical Exam Vitals and nursing note reviewed.  Constitutional:      Appearance: Normal appearance.  Cardiovascular:     Rate and Rhythm: Normal rate and regular rhythm.     Pulses: Normal  pulses.     Heart sounds: Normal heart sounds.  Pulmonary:     Effort: Pulmonary effort is normal.     Breath sounds: Normal breath sounds.  Musculoskeletal:     Cervical back: Normal range of motion and neck supple.     Comments: Normal Muscle Bulk and Muscle Testing Reveals:  Upper Extremities: Full ROM and Muscle Strength 5/5  Lower Extremities: Full ROM and Muscle Strength 5/5 Arises from Table Slowly using walker for support   Skin:    General: Skin is warm and dry.  Neurological:     Mental Status: He is alert and oriented to person, place, and time.  Psychiatric:        Mood and Affect: Mood normal.        Behavior: Behavior normal.           Assessment & Plan:  1.  Cerebrovascular accident (CVA) of right thalamus: Continue Outpatient Therapy at [facility]. He has an appointment with his neurologist. Continue to Monitor.  2.  Essential Hypertension: Continue current  medication regimen. PCP Following. Continue to Monitor.  3. Depression with Anxiety.Continue Current Medication Regimen. PCP following. Continue to Monitor.   F/U with Dr Letta Pate in 4- 6 weeks

## 2021-01-28 ENCOUNTER — Ambulatory Visit: Payer: BC Managed Care – PPO | Admitting: Physical Therapy

## 2021-01-28 ENCOUNTER — Encounter: Payer: Self-pay | Admitting: Occupational Therapy

## 2021-01-28 ENCOUNTER — Encounter: Payer: Self-pay | Admitting: Physical Therapy

## 2021-01-28 ENCOUNTER — Ambulatory Visit: Payer: BC Managed Care – PPO | Admitting: Occupational Therapy

## 2021-01-28 VITALS — BP 128/82

## 2021-01-28 DIAGNOSIS — R278 Other lack of coordination: Secondary | ICD-10-CM | POA: Diagnosis not present

## 2021-01-28 DIAGNOSIS — R42 Dizziness and giddiness: Secondary | ICD-10-CM | POA: Diagnosis not present

## 2021-01-28 DIAGNOSIS — R2689 Other abnormalities of gait and mobility: Secondary | ICD-10-CM

## 2021-01-28 DIAGNOSIS — R270 Ataxia, unspecified: Secondary | ICD-10-CM

## 2021-01-28 DIAGNOSIS — R260 Ataxic gait: Secondary | ICD-10-CM

## 2021-01-28 DIAGNOSIS — R29818 Other symptoms and signs involving the nervous system: Secondary | ICD-10-CM

## 2021-01-28 DIAGNOSIS — R2681 Unsteadiness on feet: Secondary | ICD-10-CM

## 2021-01-28 DIAGNOSIS — R41842 Visuospatial deficit: Secondary | ICD-10-CM | POA: Diagnosis not present

## 2021-01-28 NOTE — Therapy (Signed)
[facility]iguel [name] [address] [place], [place], [address] Phone: [phone]   Fax:  [phone]  Physical Therapy Treatment  Patient Details  Name: Miguel [name] MRN: [id number] Date of Birth: May 08, 1956 Referring Provider (PT): Angiulli, Lavon Paganini, PA-C (will be followed by Dr. Letta Pate)   Encounter Date: 01/28/2021   PT End of Session - 01/28/21 0806    Visit Number 6    Number of Visits 19    Authorization Type BCBS - VL 30 for PT/OT (will restart on  03/04/21)    Authorization - Visit Number 5    Authorization - Number of Visits 18    PT Start Time 0803    PT Stop Time 0845    PT Time Calculation (min) 42 min    Equipment Utilized During Treatment Gait belt    Activity Tolerance Patient tolerated treatment well    Behavior During Therapy Miguel [name] for tasks assessed/performed           Past Medical History:  Diagnosis Date  . Cervical dystonia   . Depression   . Erectile dysfunction due to arterial insufficiency   . Functional dyspepsia   . Hypertension   . Pancreatitis 2005   likely secondary to alcohol use  . Rosacea     Past Surgical History:  Procedure Laterality Date  . no past surgery      Vitals:   01/28/21 0805 01/28/21 0814 01/28/21 0826 01/28/21 0839  BP: 123/77 133/61 (!) 153/88 128/82     Subjective Assessment - 01/28/21 0805    Subjective No new compaints. No falls or pain to report. Reports BP has been doing well at home.    Pertinent History [name]. history of cervical dystonia and receives Xeomin injections in the past followed by neurology service Dr. Evelena Leyden at [facility], hypertension, depression, tobacco and alcohol use    How long can you walk comfortably? farthest walk in hospital was 90' with RW.    Diagnostic tests MRI showed acute infarct left inferior posterior cerebellum.  Small acute infarct right thalamus.    Patient Stated Goals ditch the RW - goes down to  the cane, work on balance. work on [employer] and stairs.    Currently in Pain? No/denies    Pain Score 0-No pain                 [facility] Adult PT Treatment/Exercise - 01/28/21 0809      Transfers   Transfers Stand to Sit;Sit to Stand;Floor to Transfer    Sit to Stand 5: Supervision;4: Min guard;With upper extremity assist;From bed;From chair/3-in-1    Stand to Sit 5: Supervision;4: Min guard;With upper extremity assist;To bed;To chair/3-in-1    Floor to Transfer 4: Min guard;4: Min assist    Floor to Transfer Details (indicate cue type and reason) min guard for standing to tall kneeling on red mat next to mat table, then min assist for return to sitting on mat table from tall kneeling. cues needed on technique.      Ambulation/Gait   Ambulation/Gait Yes    Ambulation/Gait Assistance 4: Min guard;4: Min assist    Ambulation/Gait Assistance Details continues to need increased assistance for balance with turns due to increased lateral shift/lean toward left. cues to slow down for improved step placement and balance.    Ambulation Distance (Feet) 230 Feet   x1   Assistive device Rolling walker    Gait Pattern Step-through pattern;Decreased stance time -  left;Decreased weight shift to left;Scissoring;Ataxic;Narrow base of support;Lateral trunk lean to left    Ambulation Surface Level;Indoor      Neuro Re-ed    Neuro Re-ed Details  for strengthening/NMR: tall kneeling on red mat - with 3# weighted ball for UE raises for 10 reps; with no UE support- mini squats for 10 reps, then added in blue band resistance for 10 reps in midline, then 10 reps with lateral bais toward each side. cues for return to tall kneeling and for slow, controlled movements. Min guard assist for safety.               Balance Exercises - 01/28/21 0833      Balance Exercises: Standing   SLS with Vectors Solid surface;Upper extremity assist 1;Other reps (comment);Limitations    SLS with Vectors  Limitations two foam bubbles on floor in front of patient: fwd, then lateral foot taps with emphasis on slow, controlled movements    Stepping Strategy Lateral;UE support;Limitations    Stepping Strategy Limitations with bil UE support- lateral stepping out/back in with blue band resistance applied in opposite direction (pt stepping away from anchor of band, back towards it) for 10 reps each side    Sit to Stand Standard surface;Upper extremity support;Foam/compliant surface;Limitations    Sit to Stand Limitations seated with feet on airex- sit<>stands x 5 reps with emphasis on tall posture and slow, controlled descent with light UE support, posterior bias noted. min guard to min assist for balance.               PT Short Term Goals - 01/18/21 1256      PT SHORT TERM GOAL #1   Title Pt will undergo further assessment of Berg balance test to determine fall risk with LTG written as appropriate. ALL STGS DUE 02/08/21.    Baseline was 23/56 when discharged from [facility]    Time 3    Period Weeks    Status New    Target Date 02/08/21      PT SHORT TERM GOAL #2   Title Pt will be independent with initial HEP with wife's support as needed in order to build upon functional gains made in therapy.    Time 3    Period Weeks    Status New      PT SHORT TERM GOAL #3   Title Pt will ambulate 15' with RW with supervision in order to demo improved household mobility.    Time 3    Period Weeks    Status New      PT SHORT TERM GOAL #4   Title Pt will decr TUG time to 17.5 seconds or less with RW in order to demo decr fall risk.    Baseline 19.72 seconds    Time 3    Period Weeks    Status New      PT SHORT TERM GOAL #5   Title Pt will ambulate up and down 8 stairs with min guard with use of R rail in order to safely go up and down the stairs in his house.    Time 3    Period Weeks    Status New             PT Long Term Goals - 01/18/21 1305      PT LONG TERM GOAL #1   Title Pt will be  independent with initial HEP with wife's support as needed in order to build upon functional gains made in  therapy. ALL LTGS DUE 03/01/21    Time 6    Period Weeks    Status New    Target Date 03/01/21      PT LONG TERM GOAL #2   Title BERG goal to be written as appropriate in order to demo decr fall risk.    Time 6    Period Weeks    Status New      PT LONG TERM GOAL #3   Title Pt will ambulate at least 100' over unlevel outdoor surfaces with RW with supervision in order to demo improved community mobility.    Time 6    Period Weeks    Status New      PT LONG TERM GOAL #4   Title Pt will improve FOTO score to 57 in order to demo improved functional outcomes.    Baseline 44    Time 6    Period Weeks    Status New      PT LONG TERM GOAL #5   Title Pt will decr TUG time to 16 seconds or less with RW  vs. LRAD in order to demo decr fall risk.    Baseline 19.72 seconds      Additional Long Term Goals   Additional Long Term Goals Yes      PT LONG TERM GOAL #6   Title Pt will improve gait speed to at least 2.1 ft/sec with RW vs. LRAD in order to demo decr fall risk and improved community mobility.    Baseline 1.84 ft/sec    Time 6    Period Weeks    Status New                 Plan - 01/28/21 0806    Clinical Impression Statement Today's skilled session continued to focus on gait with RW, strengthening and balance training with no issues noted or reported in session. Continues to need up to min assist wtih gait due to increased left lean with turns. BP remained WFL's with session today. The pt is making progress and should benefit from continued PT to progress toward unmet goals.    Personal Factors and Comorbidities Comorbidity 3+;Past/Current Experience    Comorbidities Cervical dystonia, HTN, depression, anxiety.    Examination-Activity Limitations Stairs;Stand;Squat;Locomotion Level;Transfers;Bend    Examination-Participation Restrictions Community  Activity;Driving;Yard Work;Occupation    Stability/Clinical Decision Making Evolving/Moderate complexity    Rehab Potential Good    PT Frequency 3x / week   pt's insurance re-starts at beginning of July and has a 30 VL between PT/OT   PT Duration 6 weeks    PT Treatment/Interventions ADLs/Self Care Home Management;Stair training;Gait training;DME Instruction;Functional mobility training;Therapeutic activities;Therapeutic exercise;Neuromuscular re-education;Balance training;Patient/family education;Vestibular    PT Next Visit Plan continue to monitor BP- not as orthostatic! continue gait training with RW. work on weight bearing activities due to ataxia, balance exericses in standing    PT Home Exercise Plan Access Code [place] and Agree with Plan of Care Patient           Patient will benefit from skilled therapeutic intervention in order to improve the following deficits and impairments:  Abnormal gait,Decreased coordination,Decreased balance,Decreased safety awareness,Decreased strength,Difficulty walking,Dizziness,Decreased activity tolerance  Visit Diagnosis: Unsteadiness on feet  Other abnormalities of gait and mobility  Ataxic gait  Other symptoms and signs involving the nervous system     Problem List Patient Active Problem List   Diagnosis Date Noted  . Cerebrovascular accident (CVA) of right  thalamus ([place]) 12/31/2020  . Cerebral infarction due to embolism of left cerebellar artery ([place]) 12/31/2020  . Alcohol use 12/19/2020  . Acute CVA (cerebrovascular accident) ([place]) 12/18/2020  . Depression with anxiety 12/18/2020  . Essential hypertension 12/18/2020  . Isolated cervical dystonia 12/06/2016    Willow Ora, PTA, [name] [address], [place], [place] [address] [phone] 01/28/21, 12:57 PM   Name: Miguel [name] MRN: [id number] Date of Birth: 11-28-1955

## 2021-01-28 NOTE — Therapy (Signed)
[facility] [address] [place], [place], [address] Phone: [phone]   Fax:  [phone]  Occupational Therapy Treatment  Patient Details  Name: Miguel [name] MRN: [id number] Date of Birth: 1956/06/27 Referring Provider (OT): Dr. Alysia Penna   Encounter Date: 01/28/2021   OT End of Session - 01/28/21 0813    Visit Number 4    Number of Visits 12    Date for OT Re-Evaluation 03/04/21    Authorization Type BCBS, 30 visit limit combined OT/PT.  Wife reports visit limits re-start 03/05/21.    Authorization - Visit Number 4    Authorization - Number of Visits 12   PT anticipates using 18 visits prior to 03/04/21)   OT Start Time 0850    OT Stop Time 0932    OT Time Calculation (min) 42 min    Equipment Utilized During Treatment **FOTO**    Activity Tolerance Patient tolerated treatment well    Behavior During Therapy [facility] for tasks assessed/performed           Past Medical History:  Diagnosis Date  . Cervical dystonia   . Depression   . Erectile dysfunction due to arterial insufficiency   . Functional dyspepsia   . Hypertension   . Pancreatitis 2005   likely secondary to alcohol use  . Rosacea     Past Surgical History:  Procedure Laterality Date  . no past surgery      There were no vitals filed for this visit.   Subjective Assessment - 01/28/21 0813    Subjective  Pt reports that he had a "sit down" episode last night, but doesn't last long.  Feeling good today.    Patient is accompanied by: Family member   [name]--wife   Pertinent History CVA  12/16/20.  PMH:  Cervical dystonia, HTN, depression, anxiety, orthostatic hypotension    Limitations orthostatic hypotension, monitor BP, fall risk, abdominal binder prn    Patient Stated Goals to be able to walk without a walker, do more at home (improve balance, be able to carry items, incr independence).    Currently in Pain? No/denies              Quadruped, alternating UE lifts x10 each, followed by "threading needle" and reaching overhead for incr core stability, trunk/head rotation, and shoulder stability/strength x10 each.  Sitting, performed BUE (while holding 3lb ball) to opposite LE touches  for UE strength, core stability x15.    Standing and performing functional reaching overhead and laterally to each side with trunk rotation/wt. Shift with no dizziness reported for incr standing balance and activity tolerance.  Arm bike x55min (forward/backward) without rest, level 8 for conditioning.    Sitting  BP after arm bike 114/81 (HR 106) and then 109/73 with standing (HR 107).  Ambulating with environmental scanning/navigation with approx 84% accuracy, but no dizziness reported.  Pt needed CGA for balance and min cueing for keeping LLE in RW with turning to the L.    Sitting, tossing/catching medium ball with BUEs incorporating head turns (tossed to different sides and overhead) with no reports of dizziness and min difficulty with coordination, but improved with repetition.    Standing BP at end of session 116/61 and 111/69 with no reports of dizziness or lightheadedness.           OT Short Term Goals - 01/18/21 1150      OT SHORT TERM GOAL #1   Title Pt will  be independent with  updated HEP (BUE coordination and LUE strength).--check STGs 02/17/21.    Time 4    Period Weeks    Status New      OT SHORT TERM GOAL #2   Title Pt will perform BADLs except bathing mod I.    Time 4    Period Weeks    Status New      OT SHORT TERM GOAL #3   Title Pt will be able to perform simple home maintenance task in standing with supervision.    Time 4    Period Weeks    Status New             OT Long Term Goals - 01/18/21 1157      OT LONG TERM GOAL #1   Title Pt will perform simple meal prep task with supervision.    Time 6    Period Weeks    Status New      OT LONG TERM GOAL #2   Title Pt will perform simple  home maintenance task mod I.    Time 6    Period Weeks    Status New      OT LONG TERM GOAL #3   Title Pt will peform environmental scanning/navigation in busy environment mod I without LOB or dizziness for community activities and incr safety    Time 6    Period Weeks    Status New      OT LONG TERM GOAL #4   Title Pt will verbalize understanding of AE/strategies for incr safety/independence with ADLs/IADLs.    Time 6    Period Weeks    Status New      OT LONG TERM GOAL #5   Title Pt will improve UE functional use, strength, and coordination as shown by improving FOTO score to at least 62.    Time 6    Period Weeks    Status New                 Plan - 01/28/21 0813    Clinical Impression Statement Pt continues to need close monitoring of BP as pt continues to experience drops in BP with position changes/orthostatic hypotension and low BP with decr coordination and balance, particularly with LLE when this occurs.  However, pt demo improved activity tolerance today and denies dizziness with standing, ambulation, or head movements today.    OT Occupational Profile and History Detailed Assessment- Review of Records and additional review of physical, cognitive, psychosocial history related to current functional performance    Occupational performance deficits (Please refer to evaluation for details): ADL's;IADL's;Work;Leisure    Body Structure / Function / Physical Skills ADL;Balance;Dexterity;Decreased knowledge of use of DME;Strength;GMC;UE functional use;IADL;Coordination;Mobility;FMC    Rehab Potential Good    Clinical Decision Making Several treatment options, min-mod task modification necessary    Comorbidities Affecting Occupational Performance: May have comorbidities impacting occupational performance    Modification or Assistance to Complete Evaluation  Min-Moderate modification of tasks or assist with assess necessary to complete eval    OT Frequency 2x / week    OT  Duration 6 weeks   +eval.  Wife reports that insurace visit limits restart 03/05/21; Will reassess need for further occupational therapy after 6 weeks.   OT Treatment/Interventions Self-care/ADL training;Moist Heat;DME and/or AE instruction;Balance training;Therapeutic activities;Aquatic Therapy;Therapeutic exercise;Visual/perceptual remediation/compensation;Functional Mobility Training;Neuromuscular education;Cryotherapy;Energy conservation;Patient/family education;Manual Therapy    Plan check BP (orthostatic); fine/gross motor HEP, standing balance, core/shoulder stability    Consulted and Agree with Plan  of Care Patient;Family member/caregiver    Family Member Consulted wife           Patient will benefit from skilled therapeutic intervention in order to improve the following deficits and impairments:   Body Structure / Function / Physical Skills: ADL,Balance,Dexterity,Decreased knowledge of use of DME,Strength,GMC,UE functional use,IADL,Coordination,Mobility,FMC       Visit Diagnosis: Unsteadiness on feet  Other abnormalities of gait and mobility  Other lack of coordination  Visuospatial deficit  Ataxia    Problem List Patient Active Problem List   Diagnosis Date Noted  . Cerebrovascular accident (CVA) of right thalamus ([place]) 12/31/2020  . Cerebral infarction due to embolism of left cerebellar artery ([place]) 12/31/2020  . Alcohol use 12/19/2020  . Acute CVA (cerebrovascular accident) ([place]) 12/18/2020  . Depression with anxiety 12/18/2020  . Essential hypertension 12/18/2020  . Isolated cervical dystonia 12/06/2016    [facility] 01/28/2021, 9:58 AM  [facility] [address] [place], [place], [address] Phone: [phone]   Fax:  [phone]  Name: Miguel [name] MRN: [id number] Date of Birth: 1956/07/11   Vianne Bulls, OTR/L [facility] [address] [place], [place]   [address] [phone] phone [phone] 01/28/21 9:58 AM

## 2021-02-03 ENCOUNTER — Encounter: Payer: Self-pay | Admitting: Occupational Therapy

## 2021-02-03 ENCOUNTER — Other Ambulatory Visit: Payer: Self-pay

## 2021-02-03 ENCOUNTER — Ambulatory Visit: Payer: BC Managed Care – PPO | Admitting: Occupational Therapy

## 2021-02-03 ENCOUNTER — Ambulatory Visit: Payer: BC Managed Care – PPO | Attending: Internal Medicine | Admitting: Physical Therapy

## 2021-02-03 ENCOUNTER — Encounter: Payer: Self-pay | Admitting: Physical Therapy

## 2021-02-03 VITALS — BP 119/73 | HR 90

## 2021-02-03 VITALS — BP 122/84

## 2021-02-03 DIAGNOSIS — R270 Ataxia, unspecified: Secondary | ICD-10-CM

## 2021-02-03 DIAGNOSIS — R278 Other lack of coordination: Secondary | ICD-10-CM

## 2021-02-03 DIAGNOSIS — R2681 Unsteadiness on feet: Secondary | ICD-10-CM | POA: Insufficient documentation

## 2021-02-03 DIAGNOSIS — M6281 Muscle weakness (generalized): Secondary | ICD-10-CM | POA: Insufficient documentation

## 2021-02-03 DIAGNOSIS — M25672 Stiffness of left ankle, not elsewhere classified: Secondary | ICD-10-CM | POA: Insufficient documentation

## 2021-02-03 DIAGNOSIS — R41842 Visuospatial deficit: Secondary | ICD-10-CM | POA: Insufficient documentation

## 2021-02-03 DIAGNOSIS — R42 Dizziness and giddiness: Secondary | ICD-10-CM | POA: Insufficient documentation

## 2021-02-03 DIAGNOSIS — R2689 Other abnormalities of gait and mobility: Secondary | ICD-10-CM | POA: Insufficient documentation

## 2021-02-03 DIAGNOSIS — R29818 Other symptoms and signs involving the nervous system: Secondary | ICD-10-CM | POA: Insufficient documentation

## 2021-02-03 DIAGNOSIS — R260 Ataxic gait: Secondary | ICD-10-CM | POA: Insufficient documentation

## 2021-02-03 NOTE — Therapy (Signed)
[facility] [address] [place], [place], [address] Phone: [phone]   Fax:  [phone]  Occupational Therapy Treatment  Patient Details  Name: Miguel [name] MRN: [id number] Date of Birth: 12-31-1955 Referring Provider (OT): Dr. Alysia Penna   Encounter Date: 02/03/2021   OT End of Session - 02/03/21 1216    Visit Number 5    Number of Visits 12    Date for OT Re-Evaluation 03/04/21    Authorization Type [employer], 30 visit limit combined OT/PT.  Wife reports visit limits re-start 03/05/21.    Authorization - Visit Number 5    Authorization - Number of Visits 12   PT anticipates using 18 visits prior to 03/04/21)   OT Start Time 0805    OT Stop Time 0845    OT Time Calculation (min) 40 min    Equipment Utilized During Treatment **FOTO**    Activity Tolerance Patient tolerated treatment well    Behavior During Therapy [facility] for tasks assessed/performed           Past Medical History:  Diagnosis Date  . Cervical dystonia   . Depression   . Erectile dysfunction due to arterial insufficiency   . Functional dyspepsia   . Hypertension   . Pancreatitis 2005   likely secondary to alcohol use  . Rosacea     Past Surgical History:  Procedure Laterality Date  . no past surgery      Vitals:   02/03/21 0811 02/03/21 0837 02/03/21 0845  BP: 117/76 115/81 119/73  Pulse: 85 93 90     Subjective Assessment - 02/03/21 1215    Subjective  Pt reports having a bad day yesterday and a fall    Pertinent History CVA  12/16/20.  PMH:  Cervical dystonia, HTN, depression, anxiety, orthostatic hypotension    Limitations orthostatic hypotension, monitor BP, fall risk, abdominal binder prn    Patient Stated Goals to be able to walk without a walker, do more at home (improve balance, be able to carry items, incr independence).    Currently in Pain? No/denies                 Treatment: Pt with significant LOB with sit to  stand in waiting room requiring assist to recover. Min A for amb to gym with walker. Fine motor coordination activities: flipping and dealing cards without difficulty, then stacking and manipulating coins with LUE min difficulty/ v.c Therapist monitored  BP as pt reports having a bas day yesterday. Standing to place graded clothespins on vertical antenna with LUE, no dizziness or LOB, minguard. Green theraband exercises for shoulder abduction, biceps curls and triceps extension, min v.c for performance.               OT Education - 02/03/21 1218    Education Details picking up and stacking coins for LUE coordination.    Person(s) Educated Patient;Spouse    Methods Explanation;Demonstration;Verbal cues;Handout    Comprehension Verbalized understanding;Returned demonstration;Verbal cues required            OT Short Term Goals - 01/18/21 1150      OT SHORT TERM GOAL #1   Title Pt will  be independent with updated HEP (BUE coordination and LUE strength).--check STGs 02/17/21.    Time 4    Period Weeks    Status New      OT SHORT TERM GOAL #2   Title Pt will perform BADLs except bathing mod I.  Time 4    Period Weeks    Status New      OT SHORT TERM GOAL #3   Title Pt will be able to perform simple home maintenance task in standing with supervision.    Time 4    Period Weeks    Status New             OT Long Term Goals - 01/18/21 1157      OT LONG TERM GOAL #1   Title Pt will perform simple meal prep task with supervision.    Time 6    Period Weeks    Status New      OT LONG TERM GOAL #2   Title Pt will perform simple home maintenance task mod I.    Time 6    Period Weeks    Status New      OT LONG TERM GOAL #3   Title Pt will peform environmental scanning/navigation in busy environment mod I without LOB or dizziness for community activities and incr safety    Time 6    Period Weeks    Status New      OT LONG TERM GOAL #4   Title Pt will verbalize  understanding of AE/strategies for incr safety/independence with ADLs/IADLs.    Time 6    Period Weeks    Status New      OT LONG TERM GOAL #5   Title Pt will improve UE functional use, strength, and coordination as shown by improving FOTO score to at least 62.    Time 6    Period Weeks    Status New                 Plan - 02/03/21 1217    Clinical Impression Statement Pt continues to need close monitoring of BP as pt continues to experience drops in BP with position changes/orthostatic hypotension and low BP with decr coordination and balance. Pt had a fall yesterday and a significant LOB with sit to stand in waiting room today.    OT Occupational Profile and History Detailed Assessment- Review of Records and additional review of physical, cognitive, psychosocial history related to current functional performance    Occupational performance deficits (Please refer to evaluation for details): ADL's;IADL's;Work;Leisure    Body Structure / Function / Physical Skills ADL;Balance;Dexterity;Decreased knowledge of use of DME;Strength;GMC;UE functional use;IADL;Coordination;Mobility;FMC    Rehab Potential Good    Clinical Decision Making Several treatment options, min-mod task modification necessary    Comorbidities Affecting Occupational Performance: May have comorbidities impacting occupational performance    Modification or Assistance to Complete Evaluation  Min-Moderate modification of tasks or assist with assess necessary to complete eval    OT Frequency 2x / week    OT Duration 6 weeks   +eval.  Wife reports that insurace visit limits restart 03/05/21; Will reassess need for further occupational therapy after 6 weeks.   OT Treatment/Interventions Self-care/ADL training;Moist Heat;DME and/or AE instruction;Balance training;Therapeutic activities;Aquatic Therapy;Therapeutic exercise;Visual/perceptual remediation/compensation;Functional Mobility Training;Neuromuscular  education;Cryotherapy;Energy conservation;Patient/family education;Manual Therapy    Plan use gait belt with sit-stand , pt with significant balance isssues !check BP (orthostatic);  standing balance, core/shoulder stability, issue HEP    Consulted and Agree with Plan of Care Patient;Family member/caregiver    Family Member Consulted wife           Patient will benefit from skilled therapeutic intervention in order to improve the following deficits and impairments:   Body Structure / Function /  Physical Skills: ADL,Balance,Dexterity,Decreased knowledge of use of DME,Strength,GMC,UE functional use,IADL,Coordination,Mobility,FMC       Visit Diagnosis: Other lack of coordination  Visuospatial deficit  Ataxia    Problem List Patient Active Problem List   Diagnosis Date Noted  . Cerebrovascular accident (CVA) of right thalamus ([place]) 12/31/2020  . Cerebral infarction due to embolism of left cerebellar artery ([place]) 12/31/2020  . Alcohol use 12/19/2020  . Acute CVA (cerebrovascular accident) ([place]) 12/18/2020  . Depression with anxiety 12/18/2020  . Essential hypertension 12/18/2020  . Isolated cervical dystonia 12/06/2016    Glorimar Stroope 02/03/2021, 12:20 PM  [facility] [address] [place], [place], [address] Phone: [phone]   Fax:  [phone]  Name: Kennth Vanbenschoten MRN: [id number] Date of Birth: Nov 17, 1955

## 2021-02-03 NOTE — Therapy (Addendum)
[facility] [address] [place], [place], [address] Phone: [phone]   Fax:  [phone]  Physical Therapy Treatment  Patient Details  Name: Miguel [name] MRN: [id number] Date of Birth: [birth year] Referring Provider (PT): Angiulli, Lavon Paganini, PA-C (will be followed by Dr. Letta Pate)   Encounter Date: 02/03/2021   PT End of Session - 02/03/21 0857    Visit Number 7    Number of Visits 19    Authorization Type BCBS - VL 30 for PT/OT (will restart on  03/04/21)    Authorization - Visit Number 6    Authorization - Number of Visits 18    PT Start Time 0845    PT Stop Time 0928    PT Time Calculation (min) 43 min    Equipment Utilized During Treatment Gait belt    Activity Tolerance Patient tolerated treatment well    Behavior During Therapy [facility] for tasks assessed/performed           Past Medical History:  Diagnosis Date  . Cervical dystonia   . Depression   . Erectile dysfunction due to arterial insufficiency   . Functional dyspepsia   . Hypertension   . Pancreatitis 2005   likely secondary to alcohol use  . Rosacea     Past Surgical History:  Procedure Laterality Date  . no past surgery      Vitals:   02/03/21 0857 02/03/21 0907 02/03/21 0917 02/03/21 0928  BP: 118/83 124/84 (!) 141/88 122/84     Subjective Assessment - 02/03/21 0851    Subjective Had a fall yesterday. Got up by himself to walk after lying down. Was using his walker and had belt on, just no one with him. Waited till his wife got there and then got himself up from the floor using his hands like in PT session last week. Did not hit head, does have a bruise on left hip. Over weekend did a lot of walking, including over gravel and up/down stairs.    Pertinent History [name]. history of cervical dystonia and receives Xeomin injections in the past followed by neurology service Dr. Evelena Leyden at [facility], hypertension, depression,  tobacco and alcohol use    How long can you walk comfortably? farthest walk in hospital was 24' with RW.    Diagnostic tests MRI showed acute infarct left inferior posterior cerebellum.  Small acute infarct right thalamus.    Patient Stated Goals ditch the RW - goes down to the cane, work on balance. work on [employer] and stairs.    Currently in Pain? No/denies    Pain Score 0-No pain                 [facility] Adult PT Treatment/Exercise - 02/03/21 0858      Transfers   Transfers Sit to Stand;Stand to Sit    Sit to Stand 5: Supervision;4: Min guard;With upper extremity assist;From bed;From chair/3-in-1    Stand to Sit 5: Supervision;4: Min guard;With upper extremity assist;To bed;To chair/3-in-1    Floor to Transfer 4: Min guard    Floor to Transfer Details (indicate cue type and reason) pt used mat table to lower down into tall kneeling and to come back up to sitting on mat table with min guard assist.      Ambulation/Gait   Ambulation/Gait Yes    Ambulation/Gait Assistance 4: Min guard;4: Min assist    Ambulation/Gait Assistance Details continues to need cues/facilitation to slow down, focus  on step placement, walker position and to increased right lateral weight shifting. trialed use of cane in session today after pt/spouse reported him using his at home. Pt does present with more symmetrical body posture and improved weight shifting/step placement with scissoring occuring more as pt fatigues. Pt does have instability with turns and when distracted needing up to min assist for balance.    Ambulation Distance (Feet) 230 Feet   x1, around gym with session with RW; 115 x 2 reps with straight cane   Assistive device Rolling walker;Straight cane    Gait Pattern Step-through pattern;Decreased stance time - left;Decreased weight shift to left;Scissoring;Ataxic;Narrow base of support;Lateral trunk lean to left    Ambulation Surface Level;Indoor      Self-Care   Self-Care Other  Self-Care Comments    Other Self-Care Comments  Discussesd recent fall. Discussed that with pt's fluctuating BP's he is not safe to walk alone at this time with any device. Pt and spouse verbalized understanding and agreement.      Neuro Re-ed    Neuro Re-ed Details  for strengthening/NMR: resisted gait with green theraband in posterior direction for 1 lap, then pulling toward right for 1 lap, then toward the left for 1 lap. improved position and weight shifting with pulling posterior and toward right side, min guard to min assist from rehab tech with resisted gait; tall kneeling on red mat on floor next to blue mat table: wtih green band resistance forward/backward walking with resistance for 3 laps each way, then lateral side stepping left<>right with resistance toward each direction for 3 laps each, min guard to min assist for balance with cues on posture, weight shifting and need of UE support on mat table at times for balance.                    PT Short Term Goals - 01/18/21 1256      PT SHORT TERM GOAL #1   Title Pt will undergo further assessment of Berg balance test to determine fall risk with LTG written as appropriate. ALL STGS DUE 02/08/21.    Baseline was 23/56 when discharged from [facility]    Time 3    Period Weeks    Status New    Target Date 02/08/21      PT SHORT TERM GOAL #2   Title Pt will be independent with initial HEP with wife's support as needed in order to build upon functional gains made in therapy.    Time 3    Period Weeks    Status New      PT SHORT TERM GOAL #3   Title Pt will ambulate 100' with RW with supervision in order to demo improved household mobility.    Time 3    Period Weeks    Status New      PT SHORT TERM GOAL #4   Title Pt will decr TUG time to 17.5 seconds or less with RW in order to demo decr fall risk.    Baseline 19.72 seconds    Time 3    Period Weeks    Status New      PT SHORT TERM GOAL #5   Title Pt will ambulate up and down  8 stairs with min guard with use of R rail in order to safely go up and down the stairs in his house.    Time 3    Period Weeks    Status New  PT Long Term Goals - 01/18/21 1305      PT LONG TERM GOAL #1   Title Pt will be independent with initial HEP with wife's support as needed in order to build upon functional gains made in therapy. ALL LTGS DUE 03/01/21    Time 6    Period Weeks    Status New    Target Date 03/01/21      PT LONG TERM GOAL #2   Title BERG goal to be written as appropriate in order to demo decr fall risk.    Time 6    Period Weeks    Status New      PT LONG TERM GOAL #3   Title Pt will ambulate at least 100' over unlevel outdoor surfaces with RW with supervision in order to demo improved community mobility.    Time 6    Period Weeks    Status New      PT LONG TERM GOAL #4   Title Pt will improve FOTO score to 57 in order to demo improved functional outcomes.    Baseline 44    Time 6    Period Weeks    Status New      PT LONG TERM GOAL #5   Title Pt will decr TUG time to 16 seconds or less with RW  vs. LRAD in order to demo decr fall risk.    Baseline 19.72 seconds      Additional Long Term Goals   Additional Long Term Goals Yes      PT LONG TERM GOAL #6   Title Pt will improve gait speed to at least 2.1 ft/sec with RW vs. LRAD in order to demo decr fall risk and improved community mobility.    Baseline 1.84 ft/sec    Time 6    Period Weeks    Status New              Plan - 02/03/21 0858    Clinical Impression Statement Today's skilled session initially focused on fall prevention and education on decreased safety with pt walking alone. Pt and spouse verbalized understanding. Remainder of session continued to focus on gait with RW vs cane with demo'ing improved postural alignment with cane, with min guard to min assist needed for balance with both devices. Also continued to work on strengthening and NMR with rest breaks taken as  needed. The pt is progressing toward goals and should benefit from continued PT to progress toward unmet goals.    Personal Factors and Comorbidities Comorbidity 3+;Past/Current Experience    Comorbidities Cervical dystonia, HTN, depression, anxiety.    Examination-Activity Limitations Stairs;Stand;Squat;Locomotion Level;Transfers;Bend    Examination-Participation Restrictions Community Activity;Driving;Yard Work;Occupation    Stability/Clinical Decision Making Evolving/Moderate complexity    Rehab Potential Good    PT Frequency 3x / week   pt's insurance re-starts at beginning of July and has a 30 VL between PT/OT   PT Duration 6 weeks    PT Treatment/Interventions ADLs/Self Care Home Management;Stair training;Gait training;DME Instruction;Functional mobility training;Therapeutic activities;Therapeutic exercise;Neuromuscular re-education;Balance training;Patient/family education;Vestibular    PT Next Visit Plan continue to monitor BP- not as orthostatic! continue gait training with RW. work on weight bearing activities due to ataxia, balance exericses in standing    PT [place] and Agree with Plan of Care Patient                Patient will benefit from skilled therapeutic intervention  in order to improve the following deficits and impairments:  Abnormal gait,Decreased coordination,Decreased balance,Decreased safety awareness,Decreased strength,Difficulty walking,Dizziness,Decreased activity tolerance  Visit Diagnosis: Unsteadiness on feet  Other abnormalities of gait and mobility  Ataxic gait  Other symptoms and signs involving the nervous system     Problem List Patient Active Problem List   Diagnosis Date Noted  . Cerebrovascular accident (CVA) of right thalamus ([place]) 12/31/2020  . 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 Alcohol use 12/19/2020  . Acute CVA (cerebrovascular accident)  ([place]) 12/18/2020  . Depression with anxiety 12/18/2020  . Essential hypertension 12/18/2020  . Isolated cervical dystonia 12/06/2016    Willow Ora, PTA, [facility] [address] [place], [place] [address] [phone] 02/03/21, 10:03 PM   Name: Miguel [name] MRN: [id number] Date of Birth: February 08, 1956

## 2021-02-03 NOTE — Patient Instructions (Signed)
  Coordination Activities  Perform the following activities for 10 minutes 1 times per day with left hand(s).   Pick up coins and place in container or coin bank.  Pick up coins and stack.  Pick up coins one at a time until you get 5-10 in your hand, then move coins from palm to fingertips one at a time to place in container.

## 2021-02-04 ENCOUNTER — Encounter: Payer: Self-pay | Admitting: Physical Therapy

## 2021-02-04 ENCOUNTER — Other Ambulatory Visit (HOSPITAL_COMMUNITY): Payer: Self-pay

## 2021-02-04 ENCOUNTER — Ambulatory Visit: Payer: BC Managed Care – PPO | Admitting: Physical Therapy

## 2021-02-04 VITALS — BP 129/83 | HR 98

## 2021-02-04 DIAGNOSIS — R2681 Unsteadiness on feet: Secondary | ICD-10-CM

## 2021-02-04 DIAGNOSIS — R2689 Other abnormalities of gait and mobility: Secondary | ICD-10-CM

## 2021-02-04 DIAGNOSIS — I639 Cerebral infarction, unspecified: Secondary | ICD-10-CM | POA: Diagnosis not present

## 2021-02-04 DIAGNOSIS — E876 Hypokalemia: Secondary | ICD-10-CM | POA: Diagnosis not present

## 2021-02-04 DIAGNOSIS — R269 Unspecified abnormalities of gait and mobility: Secondary | ICD-10-CM | POA: Diagnosis not present

## 2021-02-04 DIAGNOSIS — E871 Hypo-osmolality and hyponatremia: Secondary | ICD-10-CM | POA: Diagnosis not present

## 2021-02-04 DIAGNOSIS — E878 Other disorders of electrolyte and fluid balance, not elsewhere classified: Secondary | ICD-10-CM | POA: Diagnosis not present

## 2021-02-04 DIAGNOSIS — I951 Orthostatic hypotension: Secondary | ICD-10-CM | POA: Diagnosis not present

## 2021-02-04 DIAGNOSIS — R29818 Other symptoms and signs involving the nervous system: Secondary | ICD-10-CM

## 2021-02-04 DIAGNOSIS — E785 Hyperlipidemia, unspecified: Secondary | ICD-10-CM | POA: Diagnosis not present

## 2021-02-04 MED ORDER — MIDODRINE HCL 5 MG PO TABS
ORAL_TABLET | ORAL | 3 refills | Status: DC
  Filled 2021-02-04: qty 270, 90d supply, fill #0

## 2021-02-04 MED ORDER — ATORVASTATIN CALCIUM 80 MG PO TABS
ORAL_TABLET | ORAL | 3 refills | Status: AC
  Filled 2021-02-04 – 2021-02-05 (×2): qty 90, 90d supply, fill #0
  Filled 2021-05-13: qty 90, 90d supply, fill #1
  Filled 2021-05-14: qty 90, 90d supply, fill #0
  Filled 2021-07-20: qty 90, 90d supply, fill #1

## 2021-02-04 MED ORDER — MIDODRINE HCL 10 MG PO TABS
ORAL_TABLET | ORAL | 2 refills | Status: DC
  Filled 2021-02-05: qty 90, 30d supply, fill #0
  Filled 2021-03-18: qty 90, 30d supply, fill #1

## 2021-02-04 MED ORDER — CLOPIDOGREL BISULFATE 75 MG PO TABS
ORAL_TABLET | ORAL | 3 refills | Status: AC
  Filled 2021-02-04 – 2021-02-05 (×2): qty 90, 90d supply, fill #0
  Filled 2021-05-13: qty 90, 90d supply, fill #1
  Filled 2021-05-14: qty 90, 90d supply, fill #0
  Filled 2021-07-20: qty 90, 90d supply, fill #1

## 2021-02-04 NOTE — Therapy (Signed)
[facility] [address] [place], [place], [address] Phone: [phone]   Fax:  [phone]  Physical Therapy Treatment  Patient Details  Name: Miguel [name] MRN: [id number] Date of Birth: 1956/02/11 Referring Provider (PT): Angiulli, Lavon Paganini, PA-C (will be followed by Dr. Letta Pate)   Encounter Date: 02/04/2021   PT End of Session - 02/04/21 1213    Visit Number 8    Number of Visits 19    Authorization Type [employer] - VL 30 for PT/OT (will restart on  03/04/21)    Authorization - Visit Number 7    Authorization - Number of Visits 18    PT Start Time 8921    PT Stop Time 0929    PT Time Calculation (min) 42 min    Equipment Utilized During Treatment Gait belt    Activity Tolerance Patient tolerated treatment well    Behavior During Therapy [facility] for tasks assessed/performed           Past Medical History:  Diagnosis Date  . Cervical dystonia   . Depression   . Erectile dysfunction due to arterial insufficiency   . Functional dyspepsia   . Hypertension   . Pancreatitis 2005   likely secondary to alcohol use  . Rosacea     Past Surgical History:  Procedure Laterality Date  . no past surgery      Vitals:   02/04/21 0853 02/04/21 0908 02/04/21 0919  BP: 136/75 (!) 152/88 129/83  Pulse: 86 (!) 103 98     Subjective Assessment - 02/04/21 0850    Subjective No changes since yesterday. Brought in his cane from home.    Pertinent History [name]. history of cervical dystonia and receives Xeomin injections in the past followed by neurology service Dr. Evelena Leyden at [facility], hypertension, depression, tobacco and alcohol use    How long can you walk comfortably? farthest walk in hospital was 64' with RW.    Diagnostic tests MRI showed acute infarct left inferior posterior cerebellum.  Small acute infarct right thalamus.    Patient Stated Goals ditch the RW - goes down to the cane, work on balance.  work on [employer] and stairs.    Currently in Pain? No/denies                             [facility] Adult PT Treatment/Exercise - 02/04/21 0907      Transfers   Comments pt able to get down to red mat from tall kneeling and back up with min guard      Ambulation/Gait   Ambulation/Gait Yes    Ambulation/Gait Assistance 4: Min guard;4: Min assist    Ambulation/Gait Assistance Details with RW coming into and out of session, pt needing cues to slow down his pace, pt will still incr lean to LLE and demonstrates more narrow BOS going around turns. continued to practice with [facility] with initial cues for sequencing, pt having more midline posture and no episodes of scissoring    Ambulation Distance (Feet) 100 Feet   x1   Assistive device Rolling walker;Straight cane    Gait Pattern Step-through pattern;Decreased stance time - left;Decreased weight shift to left;Scissoring;Ataxic;Narrow base of support;Lateral trunk lean to left    Ambulation Surface Level;Indoor      Neuro Re-ed    Neuro Re-ed Details  On red mat on floor: in tall kneel: mini squats with holding 2# ball and  pressing ball out when squatting and bringing it back to midline when sitting back up - cues for glute activation and tactile cues for midline, in tall kneel: x5 reps B diagonals using 2# ball - cues to follow ball with head/eyes, half kneel position with RLE forwards - with UE support from mat -use of mirror as visual cue for midline as pt having tendency to lean, trying to let go for balance and pt only able to hold for 1 second. Then x10 reps weight shifting forwards onto RLE and back onto LLE, repeated with LLE forwards - still with difficulty keeping balance. Min guard as needed for balance               Balance Exercises - 02/04/21 0925      Balance Exercises: Standing   SLS with Vectors Solid surface;Upper extremity assist 1;Other reps (comment);Limitations    SLS with Vectors Limitations standing  on solid ground: alternating toe taps to 6" step - beginning with UE support and progressing to none, cues for glute activation when standing, pt with more lean towards RLE, keeping RLE as stance leg on floor and keeping LLE on step and holding balance (only able to hold for approx 3 seconds), repeating then with LLE on the ground and RLE on step (holding for approx 5 seconds), min guard/min A as needed for balance               PT Short Term Goals - 01/18/21 1256      PT SHORT TERM GOAL #1   Title Pt will undergo further assessment of Berg balance test to determine fall risk with LTG written as appropriate. ALL STGS DUE 02/08/21.    Baseline was 23/56 when discharged from [facility]    Time 3    Period Weeks    Status New    Target Date 02/08/21      PT SHORT TERM GOAL #2   Title Pt will be independent with initial HEP with wife's support as needed in order to build upon functional gains made in therapy.    Time 3    Period Weeks    Status New      PT SHORT TERM GOAL #3   Title Pt will ambulate 61' with RW with supervision in order to demo improved household mobility.    Time 3    Period Weeks    Status New      PT SHORT TERM GOAL #4   Title Pt will decr TUG time to 17.5 seconds or less with RW in order to demo decr fall risk.    Baseline 19.72 seconds    Time 3    Period Weeks    Status New      PT SHORT TERM GOAL #5   Title Pt will ambulate up and down 8 stairs with min guard with use of R rail in order to safely go up and down the stairs in his house.    Time 3    Period Weeks    Status New             PT Long Term Goals - 01/18/21 1305      PT LONG TERM GOAL #1   Title Pt will be independent with initial HEP with wife's support as needed in order to build upon functional gains made in therapy. ALL LTGS DUE 03/01/21    Time 6    Period Weeks    Status New  Target Date 03/01/21      PT LONG TERM GOAL #2   Title BERG goal to be written as appropriate in order to  demo decr fall risk.    Time 6    Period Weeks    Status New      PT LONG TERM GOAL #3   Title Pt will ambulate at least 100' over unlevel outdoor surfaces with RW with supervision in order to demo improved community mobility.    Time 6    Period Weeks    Status New      PT LONG TERM GOAL #4   Title Pt will improve FOTO score to 57 in order to demo improved functional outcomes.    Baseline 44    Time 6    Period Weeks    Status New      PT LONG TERM GOAL #5   Title Pt will decr TUG time to 16 seconds or less with RW  vs. LRAD in order to demo decr fall risk.    Baseline 19.72 seconds      Additional Long Term Goals   Additional Long Term Goals Yes      PT LONG TERM GOAL #6   Title Pt will improve gait speed to at least 2.1 ft/sec with RW vs. LRAD in order to demo decr fall risk and improved community mobility.    Baseline 1.84 ft/sec    Time 6    Period Weeks    Status New                 Plan - 02/04/21 1214    Clinical Impression Statement Pt's BP WFL throughout therapy today. Continued to work on gait with SPC with pt needing min guard/min A for balance, pt demonstrating better alignment in midline and no episodes of scissoring. Performed half kneeling activities on mat on floor today, with pt having difficulty maintaining balance for >1 second without UE support with both legs anteriorly, needed visual cues from mirror to stay in midline. Will continue to progress towards LTGs.    Personal Factors and Comorbidities Comorbidity 3+;Past/Current Experience    Comorbidities Cervical dystonia, HTN, depression, anxiety.    Examination-Activity Limitations Stairs;Stand;Squat;Locomotion Level;Transfers;Bend    Examination-Participation Restrictions Community Activity;Driving;Yard Work;Occupation    Stability/Clinical Decision Making Evolving/Moderate complexity    Rehab Potential Good    PT Frequency 3x / week   pt's insurance re-starts at beginning of July and has a 30  VL between PT/OT   PT Duration 6 weeks    PT Treatment/Interventions ADLs/Self Care Home Management;Stair training;Gait training;DME Instruction;Functional mobility training;Therapeutic activities;Therapeutic exercise;Neuromuscular re-education;Balance training;Patient/family education;Vestibular    PT Next Visit Plan continue to monitor BP- not as orthostatic! continue gait training with SPC. work on [employer] bearing activities due to ataxia (half kneel/tall kneel), balance exericses in standing. might want to try rockerboard in seated and then standing for weight shifting.    PT Home Exercise Plan Access Code [email or web address]    Consulted and Agree with Plan of Care Patient           Patient will benefit from skilled therapeutic intervention in order to improve the following deficits and impairments:  Abnormal gait,Decreased coordination,Decreased balance,Decreased safety awareness,Decreased strength,Difficulty walking,Dizziness,Decreased activity tolerance  Visit Diagnosis: Unsteadiness on feet  Other abnormalities of gait and mobility  Other symptoms and signs involving the nervous system     Problem List Patient Active Problem List   Diagnosis Date Noted  . Cerebrovascular accident (  CVA) of right thalamus ([place]) 12/31/2020  . 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 Alcohol use 12/19/2020  . Acute CVA (cerebrovascular accident) ([place]) 12/18/2020  . Depression with anxiety 12/18/2020  . Essential hypertension 12/18/2020  . Isolated cervical dystonia 12/06/2016    Arliss Journey, PT, DPT  02/04/2021, 12:19 PM  [facility] [address] [place], [place], [address] Phone: [phone]   Fax:  [phone]  Name: Miguel [name] MRN: [id number] Date of Birth: 09-01-1956

## 2021-02-05 ENCOUNTER — Inpatient Hospital Stay (HOSPITAL_COMMUNITY)
Admission: EM | Admit: 2021-02-05 | Discharge: 2021-02-09 | DRG: 493 | Disposition: A | Discharge status: Home Health | Payer: BC Managed Care – PPO | Attending: Internal Medicine | Admitting: Internal Medicine

## 2021-02-05 ENCOUNTER — Emergency Department (HOSPITAL_COMMUNITY): Payer: BC Managed Care – PPO

## 2021-02-05 ENCOUNTER — Encounter: Payer: Self-pay | Admitting: Occupational Therapy

## 2021-02-05 ENCOUNTER — Ambulatory Visit: Payer: BC Managed Care – PPO | Admitting: Physical Therapy

## 2021-02-05 ENCOUNTER — Other Ambulatory Visit: Payer: Self-pay

## 2021-02-05 ENCOUNTER — Other Ambulatory Visit (HOSPITAL_COMMUNITY): Payer: Self-pay

## 2021-02-05 ENCOUNTER — Encounter (HOSPITAL_COMMUNITY): Payer: Self-pay | Admitting: Pharmacy Technician

## 2021-02-05 ENCOUNTER — Encounter: Payer: Self-pay | Admitting: Physical Therapy

## 2021-02-05 ENCOUNTER — Ambulatory Visit: Payer: BC Managed Care – PPO | Admitting: Occupational Therapy

## 2021-02-05 VITALS — BP 136/88 | HR 95

## 2021-02-05 VITALS — BP 145/80

## 2021-02-05 DIAGNOSIS — D62 Acute posthemorrhagic anemia: Secondary | ICD-10-CM | POA: Diagnosis not present

## 2021-02-05 DIAGNOSIS — R29818 Other symptoms and signs involving the nervous system: Secondary | ICD-10-CM

## 2021-02-05 DIAGNOSIS — S82442A Displaced spiral fracture of shaft of left fibula, initial encounter for closed fracture: Secondary | ICD-10-CM | POA: Diagnosis not present

## 2021-02-05 DIAGNOSIS — S82832A Other fracture of upper and lower end of left fibula, initial encounter for closed fracture: Secondary | ICD-10-CM | POA: Diagnosis not present

## 2021-02-05 DIAGNOSIS — Z7902 Long term (current) use of antithrombotics/antiplatelets: Secondary | ICD-10-CM

## 2021-02-05 DIAGNOSIS — R2689 Other abnormalities of gait and mobility: Secondary | ICD-10-CM

## 2021-02-05 DIAGNOSIS — F418 Other specified anxiety disorders: Secondary | ICD-10-CM | POA: Diagnosis present

## 2021-02-05 DIAGNOSIS — Z825 Family history of asthma and other chronic lower respiratory diseases: Secondary | ICD-10-CM | POA: Diagnosis not present

## 2021-02-05 DIAGNOSIS — W1839XA Other fall on same level, initial encounter: Secondary | ICD-10-CM | POA: Diagnosis present

## 2021-02-05 DIAGNOSIS — R2681 Unsteadiness on feet: Secondary | ICD-10-CM

## 2021-02-05 DIAGNOSIS — I69351 Hemiplegia and hemiparesis following cerebral infarction affecting right dominant side: Secondary | ICD-10-CM

## 2021-02-05 DIAGNOSIS — I69359 Hemiplegia and hemiparesis following cerebral infarction affecting unspecified side: Secondary | ICD-10-CM | POA: Diagnosis not present

## 2021-02-05 DIAGNOSIS — Z8049 Family history of malignant neoplasm of other genital organs: Secondary | ICD-10-CM | POA: Diagnosis not present

## 2021-02-05 DIAGNOSIS — T148XXA Other injury of unspecified body region, initial encounter: Secondary | ICD-10-CM

## 2021-02-05 DIAGNOSIS — S82465A Nondisplaced segmental fracture of shaft of left fibula, initial encounter for closed fracture: Secondary | ICD-10-CM | POA: Diagnosis not present

## 2021-02-05 DIAGNOSIS — S82402A Unspecified fracture of shaft of left fibula, initial encounter for closed fracture: Secondary | ICD-10-CM | POA: Diagnosis not present

## 2021-02-05 DIAGNOSIS — Z87891 Personal history of nicotine dependence: Secondary | ICD-10-CM | POA: Diagnosis not present

## 2021-02-05 DIAGNOSIS — Z20822 Contact with and (suspected) exposure to covid-19: Secondary | ICD-10-CM | POA: Diagnosis not present

## 2021-02-05 DIAGNOSIS — I639 Cerebral infarction, unspecified: Secondary | ICD-10-CM | POA: Diagnosis not present

## 2021-02-05 DIAGNOSIS — R202 Paresthesia of skin: Secondary | ICD-10-CM | POA: Diagnosis not present

## 2021-02-05 DIAGNOSIS — S82242A Displaced spiral fracture of shaft of left tibia, initial encounter for closed fracture: Secondary | ICD-10-CM | POA: Diagnosis not present

## 2021-02-05 DIAGNOSIS — Z886 Allergy status to analgesic agent status: Secondary | ICD-10-CM

## 2021-02-05 DIAGNOSIS — I10 Essential (primary) hypertension: Secondary | ICD-10-CM | POA: Diagnosis not present

## 2021-02-05 DIAGNOSIS — Z888 Allergy status to other drugs, medicaments and biological substances status: Secondary | ICD-10-CM | POA: Diagnosis not present

## 2021-02-05 DIAGNOSIS — S82202A Unspecified fracture of shaft of left tibia, initial encounter for closed fracture: Secondary | ICD-10-CM | POA: Diagnosis present

## 2021-02-05 DIAGNOSIS — W19XXXA Unspecified fall, initial encounter: Secondary | ICD-10-CM | POA: Diagnosis not present

## 2021-02-05 DIAGNOSIS — I951 Orthostatic hypotension: Secondary | ICD-10-CM | POA: Diagnosis not present

## 2021-02-05 DIAGNOSIS — Z7289 Other problems related to lifestyle: Secondary | ICD-10-CM

## 2021-02-05 DIAGNOSIS — S82242D Displaced spiral fracture of shaft of left tibia, subsequent encounter for closed fracture with routine healing: Secondary | ICD-10-CM | POA: Diagnosis not present

## 2021-02-05 DIAGNOSIS — S82831A Other fracture of upper and lower end of right fibula, initial encounter for closed fracture: Secondary | ICD-10-CM | POA: Diagnosis present

## 2021-02-05 DIAGNOSIS — E785 Hyperlipidemia, unspecified: Secondary | ICD-10-CM | POA: Diagnosis not present

## 2021-02-05 DIAGNOSIS — E8889 Other specified metabolic disorders: Secondary | ICD-10-CM | POA: Diagnosis not present

## 2021-02-05 DIAGNOSIS — R278 Other lack of coordination: Secondary | ICD-10-CM

## 2021-02-05 DIAGNOSIS — Z79899 Other long term (current) drug therapy: Secondary | ICD-10-CM

## 2021-02-05 DIAGNOSIS — S82442D Displaced spiral fracture of shaft of left fibula, subsequent encounter for closed fracture with routine healing: Secondary | ICD-10-CM | POA: Diagnosis not present

## 2021-02-05 DIAGNOSIS — Z789 Other specified health status: Secondary | ICD-10-CM

## 2021-02-05 DIAGNOSIS — Y92019 Unspecified place in single-family (private) house as the place of occurrence of the external cause: Secondary | ICD-10-CM | POA: Diagnosis not present

## 2021-02-05 DIAGNOSIS — R260 Ataxic gait: Secondary | ICD-10-CM

## 2021-02-05 DIAGNOSIS — R270 Ataxia, unspecified: Secondary | ICD-10-CM

## 2021-02-05 LAB — CBC WITH DIFFERENTIAL/PLATELET
Abs Immature Granulocytes: 0.01 10*3/uL (ref 0.00–0.07)
Basophils Absolute: 0 10*3/uL (ref 0.0–0.1)
Basophils Relative: 0 %
Eosinophils Absolute: 0 10*3/uL (ref 0.0–0.5)
Eosinophils Relative: 0 %
HCT: 36.2 % — ABNORMAL LOW (ref 39.0–52.0)
Hemoglobin: 12.4 g/dL — ABNORMAL LOW (ref 13.0–17.0)
Immature Granulocytes: 0 %
Lymphocytes Relative: 33 %
Lymphs Abs: 1.6 10*3/uL (ref 0.7–4.0)
MCH: 33.4 pg (ref 26.0–34.0)
MCHC: 34.3 g/dL (ref 30.0–36.0)
MCV: 97.6 fL (ref 80.0–100.0)
Monocytes Absolute: 0.6 10*3/uL (ref 0.1–1.0)
Monocytes Relative: 13 %
Neutro Abs: 2.6 10*3/uL (ref 1.7–7.7)
Neutrophils Relative %: 54 %
Platelets: 211 10*3/uL (ref 150–400)
RBC: 3.71 MIL/uL — ABNORMAL LOW (ref 4.22–5.81)
RDW: 11.6 % (ref 11.5–15.5)
WBC: 4.8 10*3/uL (ref 4.0–10.5)
nRBC: 0 % (ref 0.0–0.2)

## 2021-02-05 LAB — BASIC METABOLIC PANEL
Anion gap: 13 (ref 5–15)
BUN: 11 mg/dL (ref 8–23)
CO2: 22 mmol/L (ref 22–32)
Calcium: 8.7 mg/dL — ABNORMAL LOW (ref 8.9–10.3)
Chloride: 97 mmol/L — ABNORMAL LOW (ref 98–111)
Creatinine, Ser: 0.81 mg/dL (ref 0.61–1.24)
GFR, Estimated: >60 mL/min (ref >60)
Glucose, Bld: 108 mg/dL — ABNORMAL HIGH (ref 70–99)
Potassium: 3.5 mmol/L (ref 3.5–5.1)
Sodium: 132 mmol/L — ABNORMAL LOW (ref 135–145)

## 2021-02-05 LAB — TYPE AND SCREEN
ABO/RH(D): O POS
Antibody Screen: NEGATIVE

## 2021-02-05 LAB — RESP PANEL BY RT-PCR (FLU A&B, COVID) ARPGX2
Influenza A by PCR: NEGATIVE
Influenza B by PCR: NEGATIVE
SARS Coronavirus 2 by RT PCR: NEGATIVE

## 2021-02-05 LAB — PROTIME-INR
INR: 1.1 (ref 0.8–1.2)
Prothrombin Time: 14.3 seconds (ref 11.4–15.2)

## 2021-02-05 MED ORDER — ACETAMINOPHEN 325 MG PO TABS: 650.0000 mg | ORAL_TABLET | Freq: Four times a day (QID) | ORAL | Status: DC | PRN

## 2021-02-05 MED ORDER — SODIUM CHLORIDE 0.9 % IV SOLN: INTRAVENOUS | Status: DC

## 2021-02-05 MED ORDER — ONDANSETRON HCL 4 MG PO TABS: 4.0000 mg | ORAL_TABLET | Freq: Four times a day (QID) | ORAL | Status: DC | PRN

## 2021-02-05 MED ORDER — MORPHINE SULFATE (PF) 4 MG/ML IV SOLN
4.0000 mg | Freq: Once | INTRAVENOUS | Status: AC
  Administered 2021-02-05: 4 mg via INTRAVENOUS
  Filled 2021-02-05: qty 1

## 2021-02-05 MED ORDER — ONDANSETRON HCL 4 MG/2ML IJ SOLN: 4.0000 mg | Freq: Four times a day (QID) | INTRAMUSCULAR | Status: DC | PRN

## 2021-02-05 MED ORDER — HYDROMORPHONE HCL 1 MG/ML IJ SOLN
0.5000 mg | INTRAMUSCULAR | Status: DC | PRN
Start: 2021-02-05 — End: 2021-02-06
  Administered 2021-02-05: 1 mg via INTRAVENOUS
  Filled 2021-02-05: qty 1

## 2021-02-05 MED ORDER — ACETAMINOPHEN 650 MG RE SUPP: 650.0000 mg | Freq: Four times a day (QID) | RECTAL | Status: DC | PRN

## 2021-02-05 NOTE — ED Provider Notes (Addendum)
[facility] EMERGENCY DEPARTMENT Provider Note   CSN: [id number] Arrival date & time: 02/05/21  1853     History Chief Complaint  Patient presents with  . Leg Injury    Miguel [name] is a 65 y.o. male presenting for evaluation of left leg injury.  Patient states just prior to arrival his left leg gave out on him and twisted under him, and Miguel [name] sat on his left leg.  Miguel [name] heard a crack.  Since then, Miguel [name] has had pain in his left lower leg with obvious deformity.  Miguel [name] has not been able to walk since.  Miguel [name] has not taken anything for pain.  Miguel [name] did not hit his head or lose consciousness.  Miguel [name] denies injury or pain elsewhere.  Miguel [name] is on Plavix.  Miguel [name] does not have an orthopedic doctor. Pain is constant, worsen with movement. Does not radiate. No numbness.   HPI     Past Medical History:  Diagnosis Date  . Cervical dystonia   . Depression   . Erectile dysfunction due to arterial insufficiency   . Functional dyspepsia   . Hypertension   . Pancreatitis 2005   likely secondary to alcohol use  . Rosacea     Patient Active Problem List   Diagnosis Date Noted  . Cerebrovascular accident (CVA) of right thalamus ([place]) 12/31/2020  . Cerebral infarction due to embolism of left cerebellar artery ([place]) 12/31/2020  . Alcohol use 12/19/2020  . Acute CVA (cerebrovascular accident) ([place]) 12/18/2020  . Depression with anxiety 12/18/2020  . Essential hypertension 12/18/2020  . Isolated cervical dystonia 12/06/2016    Past Surgical History:  Procedure Laterality Date  . no past surgery         Family History  Problem Relation Age of Onset  . Uterine cancer Mother   . Emphysema Father     Social History   Tobacco Use  . Smoking status: Former Research scientist (life sciences)  . Smokeless tobacco: Never Used  Substance Use Topics  . Alcohol use: Yes    Comment: 3 beers per day  . Drug use: No    Home Medications Prior to Admission medications   Medication Sig Start Date End Date Taking? Authorizing  Provider  acetaminophen (TYLENOL) 500 MG tablet Take 500 mg by mouth every 6 (six) hours as needed for moderate pain or headache.    [provider]  atorvastatin (LIPITOR) 80 MG tablet Take 1 tablet by mouth Once a day 02/04/21     baclofen (LIORESAL) 10 MG tablet Take 1 tablet (10 mg total) by mouth 3 (three) times daily as needed for muscle spasms. Patient not taking: Reported on 01/27/2021 01/13/21   Angiulli, Lavon Paganini, PA-C  buPROPion [facility] SR) 150 MG 12 hr tablet TAKE 1 TABLET BY MOUTH ONCE DAILY 90 01/13/21 01/13/22  Angiulli, Lavon Paganini, PA-C  calcium carbonate (TUMS - DOSED IN MG ELEMENTAL CALCIUM) 500 MG chewable tablet Chew 1 tablet (200 mg of elemental calcium total) by mouth as needed for indigestion or heartburn. 01/13/21   Angiulli, Lavon Paganini, PA-C  Cholecalciferol (VITAMIN D) 50 MCG (2000 UT) CAPS Take 1 capsule (2,000 Units total) by mouth daily. 01/13/21   Angiulli, Lavon Paganini, PA-C  clonazePAM (KLONOPIN) 1 MG tablet TAKE 1 TABLET BY MOUTH ONCE A DAY 01/13/21 07/12/21  Angiulli, Lavon Paganini, PA-C  clopidogrel (PLAVIX) 75 MG tablet Take 1 tablet by mouth Once a day 02/04/21     esomeprazole (NEXIUM) 40 MG capsule Take 1 capsule (40 mg total)  by mouth as needed (heartburn). Patient not taking: No sig reported 01/13/21   Angiulli, Lavon Paganini, PA-C  meclizine (ANTIVERT) 25 MG tablet Take 1 tablet (25 mg total) by mouth 2 (two) times daily as needed for dizziness. Patient not taking: Reported on 01/27/2021 01/13/21   Angiulli, Lavon Paganini, PA-C  Melatonin 1 MG CHEW Chew by mouth.    [provider]  midodrine (PROAMATINE) 10 MG tablet Take 1 tablet by mouth Three times a day 30 days 02/04/21     midodrine (PROAMATINE) 5 MG tablet Take 1 tablet by mouth Three times a day 90 day(s) Patient not taking: Reported on 02/05/2021 01/20/21     midodrine (PROAMATINE) 5 MG tablet Take 1 tablet by mouth 3 times a day Patient not taking: Reported on 02/05/2021 02/04/21     Multiple Vitamin (MULTIVITAMIN)  capsule Take 1 capsule by mouth daily.    [provider]  polyethylene glycol (MIRALAX / GLYCOLAX) 17 g packet Take 17 g by mouth 2 (two) times daily. Patient not taking: Reported on 01/27/2021 01/13/21   Angiulli, Lavon Paganini, PA-C  traMADol (ULTRAM) 50 MG tablet Take 1 tablet (50 mg total) by mouth every 8 (eight) hours as needed for severe pain (headache). Patient not taking: Reported on 01/27/2021 01/13/21   Cathlyn Parsons, PA-C  venlafaxine XR (EFFEXOR-XR) 75 MG 24 hr capsule TAKE 1 CAPSULE BY MOUTH ONCE A DAY 01/13/21 01/13/22  Angiulli, Lavon Paganini, PA-C    Allergies    Aspirin  Review of Systems   Review of Systems  Musculoskeletal: Positive for arthralgias.  Hematological: Bruises/bleeds easily.  All other systems reviewed and are negative.   Physical Exam Updated Vital Signs BP 108/76 (BP Location: Right Arm)   Pulse (!) 101   Temp 98.7 F (37.1 C) (Oral)   Resp 18   SpO2 96%   Physical Exam Vitals and nursing note reviewed.  Constitutional:      General: Miguel [name] is not in acute distress.    Appearance: Miguel [name] is well-developed.  HENT:     Head: Normocephalic and atraumatic.  Eyes:     Conjunctiva/sclera: Conjunctivae normal.     Pupils: Pupils are equal, round, and reactive to light.  Cardiovascular:     Rate and Rhythm: Normal rate and regular rhythm.     Pulses: Normal pulses.  Pulmonary:     Effort: Pulmonary effort is normal. No respiratory distress.     Breath sounds: Normal breath sounds. No wheezing.  Abdominal:     General: There is no distension.     Palpations: Abdomen is soft. There is no mass.     Tenderness: There is no abdominal tenderness. There is no guarding or rebound.  Musculoskeletal:        General: Swelling and deformity present.     Cervical back: Normal range of motion and neck supple.     Comments: Obvious deformity of the left lower on the bottom third of the tib-fib.  Pedal pulse 2+ bilaterally.  Good distal sensation and cap refill.   Skin:    General: Skin is warm and dry.     Capillary Refill: Capillary refill takes less than 2 seconds.  Neurological:     Mental Status: Miguel [name] is alert and oriented to person, place, and time.     ED Results / Procedures / Treatments   Labs (all labs ordered are listed, but only abnormal results are displayed) Labs Reviewed  CBC WITH DIFFERENTIAL/PLATELET - Abnormal; Notable for the  following components:      Result Value   RBC 3.71 (*)    Hemoglobin 12.4 (*)    HCT 36.2 (*)    All other components within normal limits  BASIC METABOLIC PANEL - Abnormal; Notable for the following components:   Sodium 132 (*)    Chloride 97 (*)    Glucose, Bld 108 (*)    Calcium 8.7 (*)    All other components within normal limits  RESP PANEL BY RT-PCR (FLU A&B, COVID) [id number]  PROTIME-INR  TYPE AND SCREEN  ABO/RH    EKG None  Radiology DG Tibia/Fibula Left  Result Date: 02/05/2021 CLINICAL DATA:  Pain following fall EXAM: LEFT TIBIA AND FIBULA - 2 VIEW COMPARISON:  None. FINDINGS: Frontal and lateral views obtained. There is a spiral type fracture of the distal tibial diaphysis located approximately 0.5 cm proximal to the tibial plafond and with lateral displacement and slight posterior angulation of the distal fracture fragment with respect to the proximal fragment. There is a comminuted fracture of the distal fibular metaphysis with alignment near anatomic at this fracture site. There is also a comminuted spiral type fracture of the proximal fibular diaphysis with alignment near anatomic. No dislocations. There is mild generalized knee joint space narrowing. No erosions. IMPRESSION: 1. Spiral fracture distal tibial diaphysis with lateral displacement and mild posterior angulation of the distal fracture fragment with respect proximal fragment. 2. Comminuted fractures of the proximal and distal aspects of the fibula with alignment at both fracture sites overall type. 3.  No dislocation. 4.   Somewhat generalized narrowing of knee joint. Electronically Signed   By: Lowella Grip III M.D.   On: 02/05/2021 19:57    Procedures .Splint Application  Date/Time: 02/05/2021 9:40 PM Performed by: Franchot Heidelberg, PA-C Authorized by: Franchot Heidelberg, PA-C   Consent:    Consent obtained:  Verbal   Consent given by:  Patient   Risks discussed:  Discoloration, numbness, pain and swelling Procedure details:    Location:  Leg   Leg location:  L lower leg   Strapping: yes     Splint type:  Ankle stirrup and short leg   Supplies:  Fiberglass   Attestation: Splint applied and adjusted personally by me   Post-procedure details:    Distal perfusion: distal pulses strong and brisk capillary refill     Procedure completion:  Tolerated well, no immediate complications     Medications Ordered in ED Medications  morphine 4 MG/ML injection 4 mg (has no administration in time range)  morphine 4 MG/ML injection 4 mg (4 mg Intravenous Given 02/05/21 1953)    ED Course  I have reviewed the triage vital signs and the nursing notes.  Pertinent labs & imaging results that were available during my care of the patient were reviewed by me and considered in my medical decision making (see chart for details).    MDM Rules/Calculators/A&P                          Patient presented for evaluation of left leg injury.  On exam, patient has obvious deformity.  Miguel [name] is neurovascularly intact.  Miguel [name] denies hitting his head or loss consciousness.  While Miguel [name] is on Plavix, without head injury I do not feel Miguel [name] needs head CT at this time.  X-rays ordered of the leg.  Will order basic labs, patient will likely need surgery.  X-ray viewed and independently interpreted by me, shows tibia  fracture with both distal and proximal fibula fractures.  Will consult with orthopedics.  Discussed with Dr. Tamera Punt from orthopedics, who recommends admission to medicine with plan for surgery either tomorrow or the next  day.  Discussed with Dr. Jonelle Sidle from [facility] hospitalist service, pt to be admitted.    Pt's leg placed in posterior splint with stirrups per ortho recommendations. See procedure above as needed for further info.   Final Clinical Impression(s) / ED Diagnoses Final diagnoses:  Closed displaced spiral fracture of shaft of left tibia, initial encounter  Closed fracture of proximal end of left fibula, unspecified fracture morphology, initial encounter  Closed fracture of distal end of left fibula, unspecified fracture morphology, initial encounter  Fall, initial encounter    Rx / DC Orders ED Discharge Orders    None       Franchot Heidelberg, PA-C 02/05/21 2048    Franchot Heidelberg, PA-C 02/05/21 2141    Davonna Belling, MD 02/06/21 0002

## 2021-02-05 NOTE — Therapy (Signed)
Miguel [name] [address] [place], [place], [address] Phone: [phone]   Fax:  [phone]  Physical Therapy Treatment  Patient Details  Name: Miguel [name] MRN: [id number] Date of Birth: September 05, 1956 Referring Provider (PT): [name], Miguel Paganini, PA-C (will be followed by Dr. Letta [name])   Encounter Date: 02/05/2021   PT End of Session - 02/05/21 0811    Visit Number 9    Number of Visits 19    Authorization Type BC[employer] - VL 30 for PT/OT (will restart on  03/04/21)    Authorization - Visit Number 8    Authorization - Number of Visits 18    PT Start Time 0804    PT Stop Time 3532    PT Time Calculation (min) 40 min    Equipment Utilized During Treatment Gait belt    Activity Tolerance Patient tolerated treatment well    Behavior During Therapy [facility] for tasks assessed/performed           Past Medical History:  Diagnosis Date  . Cervical dystonia   . Depression   . Erectile dysfunction due to arterial insufficiency   . Functional dyspepsia   . Hypertension   . Pancreatitis 2005   likely secondary to alcohol use  . Rosacea     Past Surgical History:  Procedure Laterality Date  . no past surgery      Vitals:   02/05/21 0810 02/05/21 0828 02/05/21 0837 02/05/21 0843  BP: 121/79 128/79 139/85 136/88  Pulse: 86  98 95     Subjective Assessment - 02/05/21 0810    Subjective No falls or new pain. Does reports nerve pain on back of right leg,  ? Sciatic pain. Did have BP medication (Midodrine) increased yesterday to 10 mg 3x a day, started new dosage yesterday around pm.    Pertinent History [name]. history of cervical dystonia and receives Xeomin injections in the past followed by neurology service Dr. Evelena [name] at [facility], hypertension, depression, tobacco and alcohol use    How long can you walk comfortably? farthest walk in hospital was 66' with RW.    Diagnostic tests MRI showed acute infarct  left inferior posterior cerebellum.  Small acute infarct right thalamus.    Patient Stated Goals ditch the RW - goes down to the cane, work on balance. work on [employer] and stairs.    Currently in Pain? No/denies    Pain Score 0-No pain                    [facility] Adult PT Treatment/Exercise - 02/05/21 0813      Transfers   Transfers Sit to Stand;Stand to Sit    Sit to Stand 5: Supervision;4: Min guard;With upper extremity assist;From bed;From chair/3-in-1    Stand to Sit 5: Supervision;4: Min guard;With upper extremity assist;To bed;To chair/3-in-1      Ambulation/Gait   Ambulation/Gait Yes    Ambulation/Gait Assistance 4: Min guard;4: Min assist    Ambulation/Gait Assistance Details cues/facilitation for posture, base of support and sequencing with cane. pt with mis step of left LE due to ataxia at times, able to self correct any balance loss created by this. continues to need up to min assist at times, mostly with turns and with scanning enviroment (performed on 2cd lap).    Ambulation Distance (Feet) 115 Feet   x 2, plus around gym with activity   Assistive device Rolling walker;Straight cane    Gait Pattern  Step-through pattern;Decreased stance time - left;Decreased weight shift to left;Scissoring;Ataxic;Narrow base of support;Lateral trunk lean to left    Ambulation Surface Level;Indoor      High Level Balance   High Level Balance Activities Negotitating around obstacles;Negotiating over obstacles    High Level Balance Comments with cane: weaving around 5 cones on floor with 180* turns at end with min guard to min assist for balnance. cues for sequencing, step length, step placement and weight shifitng needed. then with 4 bolsters on floor: forward stepping over bolsters with cues on correct sequencing, min guard assist. 4-6 laps of each performed.      Knee/Hip Exercises: Aerobic   Other Aerobic Scifit LE/UE's level 4.0 x8 minutes with goal >/= 80 steps per minute.                PT Short Term Goals - 01/18/21 1256      PT SHORT TERM GOAL #1   Title Pt will undergo further assessment of Berg balance test to determine fall risk with LTG written as appropriate. ALL STGS DUE 02/08/21.    Baseline was 23/56 when discharged from [facility]    Time 3    Period Weeks    Status New    Target Date 02/08/21      PT SHORT TERM GOAL #2   Title Pt will be independent with initial HEP with wife's support as needed in order to build upon functional gains made in therapy.    Time 3    Period Weeks    Status New      PT SHORT TERM GOAL #3   Title Pt will ambulate 68' with RW with supervision in order to demo improved household mobility.    Time 3    Period Weeks    Status New      PT SHORT TERM GOAL #4   Title Pt will decr TUG time to 17.5 seconds or less with RW in order to demo decr fall risk.    Baseline 19.72 seconds    Time 3    Period Weeks    Status New      PT SHORT TERM GOAL #5   Title Pt will ambulate up and down 8 stairs with min guard with use of R rail in order to safely go up and down the stairs in his house.    Time 3    Period Weeks    Status New             PT Long Term Goals - 01/18/21 1305      PT LONG TERM GOAL #1   Title Pt will be independent with initial HEP with wife's support as needed in order to build upon functional gains made in therapy. ALL LTGS DUE 03/01/21    Time 6    Period Weeks    Status New    Target Date 03/01/21      PT LONG TERM GOAL #2   Title BERG goal to be written as appropriate in order to demo decr fall risk.    Time 6    Period Weeks    Status New      PT LONG TERM GOAL #3   Title Pt will ambulate at least 100' over unlevel outdoor surfaces with RW with supervision in order to demo improved community mobility.    Time 6    Period Weeks    Status New      PT LONG TERM GOAL #4  Title Pt will improve FOTO score to 57 in order to demo improved functional outcomes.    Baseline 44    Time 6     Period Weeks    Status New      PT LONG TERM GOAL #5   Title Pt will decr TUG time to 16 seconds or less with RW  vs. LRAD in order to demo decr fall risk.    Baseline 19.72 seconds      Additional Long Term Goals   Additional Long Term Goals Yes      PT LONG TERM GOAL #6   Title Pt will improve gait speed to at least 2.1 ft/sec with RW vs. LRAD in order to demo decr fall risk and improved community mobility.    Baseline 1.84 ft/sec    Time 6    Period Weeks    Status New                 Plan - 02/05/21 7017    Clinical Impression Statement Today's skilled session continued to focus on strengthening, activity tolerance and gait/activity with cane. BP was [facility] throughout session. No issues noted or reported with session. The pt is making steady progress toward goals and should benefit from continued PT to progress toward unmet goals.    Personal Factors and Comorbidities Comorbidity 3+;Past/Current Experience    Comorbidities Cervical dystonia, HTN, depression, anxiety.    Examination-Activity Limitations Stairs;Stand;Squat;Locomotion Level;Transfers;Bend    Examination-Participation Restrictions Community Activity;Driving;Yard Work;Occupation    Stability/Clinical Decision Making Evolving/Moderate complexity    Rehab Potential Good    PT Frequency 3x / week   pt's insurance re-starts at beginning of July and has a 30 VL between PT/OT   PT Duration 6 weeks    PT Treatment/Interventions ADLs/Self Care Home Management;Stair training;Gait training;DME Instruction;Functional mobility training;Therapeutic activities;Therapeutic exercise;Neuromuscular re-education;Balance training;Patient/family education;Vestibular    PT Next Visit Plan STGs due 02/11/21. continue to monitor BP- not as orthostatic! continue gait training with SPC. work on [employer] bearing activities due to ataxia (half kneel/tall kneel), balance exericses in standing. might want to try rockerboard in seated and then  standing for weight shifting.    PT Home Exercise Plan Access Code [email or web address]    Consulted and Agree with Plan of Care Patient           Patient will benefit from skilled therapeutic intervention in order to improve the following deficits and impairments:  Abnormal gait,Decreased coordination,Decreased balance,Decreased safety awareness,Decreased strength,Difficulty walking,Dizziness,Decreased activity tolerance  Visit Diagnosis: Unsteadiness on feet  Other abnormalities of gait and mobility  Other symptoms and signs involving the nervous system  Ataxic gait     Problem List Patient Active Problem List   Diagnosis Date Noted  . Cerebrovascular accident (CVA) of right thalamus ([place]) 12/31/2020  . Cerebral infarction due to embolism of left cerebellar artery ([place]) 12/31/2020  . Alcohol use 12/19/2020  . Acute CVA (cerebrovascular accident) ([place]) 12/18/2020  . Depression with anxiety 12/18/2020  . Essential hypertension 12/18/2020  . Isolated cervical dystonia 12/06/2016   Willow Ora, PTA, [facility] [address], [place], [place] [address] [phone] 02/05/21, 9:28 AM   Name: Miguel [name] MRN: [id number] Date of Birth: [birth year]

## 2021-02-05 NOTE — ED Notes (Signed)
Pt refused COVID-19 swab stating he was just here one week ago and did not need the test. This RN attempted to explain rationale for obtaining specimen but pt still refused.

## 2021-02-05 NOTE — ED Triage Notes (Signed)
Pt here POV after mechanical fall with LLE injury. Pt with obvious deformity to LLE. Abrasion noted to leg as well. Pt on plavix. No head trauma noted.

## 2021-02-05 NOTE — H&P (Signed)
History and Physical   [name]:[id number] DOB: [birth year] DOA: 02/05/2021  Referring MD/NP/PA: Dr. Alvino Chapel  PCP: Josetta Huddle, MD   Outpatient Specialists: None  Patient coming from: Home  Chief Complaint: Fall with left leg injury  HPI: Miguel [name] is a 65 y.o. male with medical history significant of recent CVA about 2 months ago who is recuperating at home with mild right-sided hemiparesis, essential hypertension, depression, hyperlipidemia, previous alcohol use who is currently on Plavix following his CVA.  Patient has been doing great with physical therapy.  Apparently today he was downgraded from using the walker to a cane.  He was tried to use the cane and be independent.  Wife has told patient to wait for her surgical assistant to get up but before she could get back he tried to do it on his own.  His leg gave out underneath him twisted under him as he heard a crack when he sat on the left leg.  He was brought into the ER where he was found to have distal tibial fracture with some fractures of the fibula on the left side.  His pain was a 10 out of 10.  Now improved.  Patient has been seen by Dr. Tamera Punt of orthopedics.  Leg is splinted 1 plan is for surgical repair probably tomorrow.  Due to his medical problems he is being admitted to the medical service with orthopedic consultation..  ED Course: Temperature is 98.7 blood pressure 145/80, pulse 101, respirate of 18 oxygen sat 96% on room air.  White count 4.8, hemoglobin 12.4, platelets 211.  Sodium is 132, potassium 3.5 and chloride 97.  CO2 22 BUN 11 creatinine 0.81 calcium 8.7.  Glucose 108.  COVID-19 screen is negative.  X-ray of the left lower extremity showed spiral fracture of the distal tibial diaphysis with lateral displacement and mild posterior angulation.  Comminuted fracture of the proximal and distal aspect the fibula.  Orthopedics has seen the patient and recommends n.p.o. after midnight for possible surgery  tomorrow.  Review of Systems: As per HPI otherwise 10 point review of systems negative.    Past Medical History:  Diagnosis Date  . Cervical dystonia   . Depression   . Erectile dysfunction due to arterial insufficiency   . Functional dyspepsia   . Hypertension   . Pancreatitis 2005   likely secondary to alcohol use  . Rosacea     Past Surgical History:  Procedure Laterality Date  . no past surgery       reports that he has quit smoking. He has never used smokeless tobacco. He reports current alcohol use. He reports that he does not use drugs.  Allergies  Allergen Reactions  . Aspirin Anaphylaxis    Childhood reaction  . Losartan Potassium     Other reaction(s): shakes/nervousness  . Sildenafil Citrate     Other reaction(s): severe headache    Family History  Problem Relation Age of Onset  . Uterine cancer Mother   . Emphysema Father      Prior to Admission medications   Medication Sig Start Date End Date Taking? Authorizing Provider  acetaminophen (TYLENOL) 500 MG tablet Take 500 mg by mouth every 6 (six) hours as needed for moderate pain or headache.    [provider]  atorvastatin (LIPITOR) 80 MG tablet Take 1 tablet by mouth Once a day 02/04/21     baclofen (LIORESAL) 10 MG tablet Take 1 tablet (10 mg total) by mouth 3 (three) times daily  as needed for muscle spasms. Patient not taking: Reported on 01/27/2021 01/13/21   Angiulli, Lavon Paganini, PA-C  buPROPion [facility] SR) 150 MG 12 hr tablet TAKE 1 TABLET BY MOUTH ONCE DAILY 90 Patient taking differently: Take 150 mg by mouth daily. 01/13/21 01/13/22  Angiulli, Lavon Paganini, PA-C  calcium carbonate (TUMS - DOSED IN MG ELEMENTAL CALCIUM) 500 MG chewable tablet Chew 1 tablet (200 mg of elemental calcium total) by mouth as needed for indigestion or heartburn. 01/13/21   Angiulli, Lavon Paganini, PA-C  Cholecalciferol (VITAMIN D) 50 MCG (2000 UT) CAPS Take 1 capsule (2,000 Units total) by mouth daily. 01/13/21   Angiulli,  Lavon Paganini, PA-C  clonazePAM (KLONOPIN) 1 MG tablet TAKE 1 TABLET BY MOUTH ONCE A DAY Patient taking differently: Take 1 mg by mouth daily. 01/13/21 07/12/21  Angiulli, Lavon Paganini, PA-C  clopidogrel (PLAVIX) 75 MG tablet Take 1 tablet by mouth Once a day 02/04/21     esomeprazole (NEXIUM) 40 MG capsule Take 1 capsule (40 mg total) by mouth as needed (heartburn). Patient not taking: No sig reported 01/13/21   Angiulli, Lavon Paganini, PA-C  meclizine (ANTIVERT) 25 MG tablet Take 1 tablet (25 mg total) by mouth 2 (two) times daily as needed for dizziness. Patient not taking: Reported on 01/27/2021 01/13/21   Angiulli, Lavon Paganini, PA-C  Melatonin 1 MG CHEW Chew by mouth.    [provider]  midodrine (PROAMATINE) 10 MG tablet Take 1 tablet by mouth Three times a day 30 days 02/04/21     midodrine (PROAMATINE) 5 MG tablet Take 1 tablet by mouth Three times a day 90 day(s) Patient not taking: Reported on 02/05/2021 01/20/21     midodrine (PROAMATINE) 5 MG tablet Take 1 tablet by mouth 3 times a day Patient not taking: Reported on 02/05/2021 02/04/21     Multiple Vitamin (MULTIVITAMIN) capsule Take 1 capsule by mouth daily.    [provider]  polyethylene glycol (MIRALAX / GLYCOLAX) 17 g packet Take 17 g by mouth 2 (two) times daily. Patient not taking: Reported on 01/27/2021 01/13/21   Angiulli, Lavon Paganini, PA-C  traMADol (ULTRAM) 50 MG tablet Take 1 tablet (50 mg total) by mouth every 8 (eight) hours as needed for severe pain (headache). Patient not taking: Reported on 01/27/2021 01/13/21   Cathlyn Parsons, PA-C  venlafaxine XR (EFFEXOR-XR) 75 MG 24 hr capsule TAKE 1 CAPSULE BY MOUTH ONCE A DAY Patient taking differently: Take 75 mg by mouth daily. 01/13/21 01/13/22  Cathlyn Parsons, PA-C    Physical Exam: Vitals:   02/05/21 1901  BP: 108/76  Pulse: (!) 101  Resp: 18  Temp: 98.7 F (37.1 C)  TempSrc: Oral  SpO2: 96%      Constitutional: Anxious, no acute distress Vitals:   02/05/21 1901  BP:  108/76  Pulse: (!) 101  Resp: 18  Temp: 98.7 F (37.1 C)  TempSrc: Oral  SpO2: 96%   Eyes: PERRL, lids and conjunctivae normal ENMT: Mucous membranes are moist. Posterior pharynx clear of any exudate or lesions.Normal dentition.  Neck: normal, supple, no masses, no thyromegaly Respiratory: clear to auscultation bilaterally, no wheezing, no crackles. Normal respiratory effort. No accessory muscle use.  Cardiovascular: Sinus tachycardia, no murmurs / rubs / gallops. No extremity edema. 2+ pedal pulses. No carotid bruits.  Abdomen: no tenderness, no masses palpated. No hepatosplenomegaly. Bowel sounds positive.  Musculoskeletal: Angulated left lower extremity at the knee with swelling tenderness and warmth no clubbing / cyanosis. No joint  deformity upper and lower extremities. Good ROM, no contractures. Normal muscle tone.  Skin: no rashes, lesions, ulcers. No induration Neurologic: CN 2-12 grossly intact. Sensation intact, DTR normal. Strength 5/5 in all 4.  Psychiatric: Normal judgment and insight. Alert and oriented x 3. Normal mood.     Labs on Admission: I have personally reviewed following labs and imaging studies  CBC: Recent Labs  Lab 02/05/21 1915  WBC 4.8  NEUTROABS 2.6  HGB 12.4*  HCT 36.2*  MCV 97.6  PLT 176   Basic Metabolic Panel: Recent Labs  Lab 02/05/21 1915  NA 132*  K 3.5  CL 97*  CO2 22  GLUCOSE 108*  BUN 11  CREATININE 0.81  CALCIUM 8.7*   GFR: Estimated Creatinine Clearance: 101.1 mL/min (by C-G formula based on SCr of 0.81 mg/dL). Liver Function Tests: No results for input(s): AST, ALT, ALKPHOS, BILITOT, PROT, ALBUMIN in the last 168 hours. No results for input(s): LIPASE, AMYLASE in the last 168 hours. No results for input(s): AMMONIA in the last 168 hours. Coagulation Profile: Recent Labs  Lab 02/05/21 1915  INR 1.1   Cardiac Enzymes: No results for input(s): CKTOTAL, CKMB, CKMBINDEX, TROPONINI in the last 168 hours. BNP (last 3  results) No results for input(s): PROBNP in the last 8760 hours. HbA1C: No results for input(s): HGBA1C in the last 72 hours. CBG: No results for input(s): GLUCAP in the last 168 hours. Lipid Profile: No results for input(s): CHOL, HDL, LDLCALC, TRIG, CHOLHDL, LDLDIRECT in the last 72 hours. Thyroid Function Tests: No results for input(s): TSH, T4TOTAL, FREET4, T3FREE, THYROIDAB in the last 72 hours. Anemia Panel: No results for input(s): VITAMINB12, FOLATE, FERRITIN, TIBC, IRON, RETICCTPCT in the last 72 hours. Urine analysis:    Component Value Date/Time   COLORURINE YELLOW 12/18/2020 [place] 12/18/2020 0643   LABSPEC 1.017 12/18/2020 0643   PHURINE 6.0 12/18/2020 0643   GLUCOSEU NEGATIVE 12/18/2020 0643   HGBUR NEGATIVE 12/18/2020 0643   BILIRUBINUR NEGATIVE 12/18/2020 0643   KETONESUR 80 (A) 12/18/2020 0643   PROTEINUR NEGATIVE 12/18/2020 0643   NITRITE NEGATIVE 12/18/2020 0643   LEUKOCYTESUR NEGATIVE 12/18/2020 0643   Sepsis Labs: @LABRCNTIP (procalcitonin:4,lacticidven:4) )No results found for this or any previous visit (from the past 240 hour(s)).   Radiological Exams on Admission: DG Tibia/Fibula Left  Result Date: 02/05/2021 CLINICAL DATA:  Pain following fall EXAM: LEFT TIBIA AND FIBULA - 2 VIEW COMPARISON:  None. FINDINGS: Frontal and lateral views obtained. There is a spiral type fracture of the distal tibial diaphysis located approximately 0.5 cm proximal to the tibial plafond and with lateral displacement and slight posterior angulation of the distal fracture fragment with respect to the proximal fragment. There is a comminuted fracture of the distal fibular metaphysis with alignment near anatomic at this fracture site. There is also a comminuted spiral type fracture of the proximal fibular diaphysis with alignment near anatomic. No dislocations. There is mild generalized knee joint space narrowing. No erosions. IMPRESSION: 1. Spiral fracture distal  tibial diaphysis with lateral displacement and mild posterior angulation of the distal fracture fragment with respect proximal fragment. 2. Comminuted fractures of the proximal and distal aspects of the fibula with alignment at both fracture sites overall type. 3.  No dislocation. 4.  Somewhat generalized narrowing of knee joint. Electronically Signed   By: Lowella Grip III M.D.   On: 02/05/2021 19:57      Assessment/Plan Principal Problem:   Fall Active Problems:   Acute CVA (  cerebrovascular accident) [facility])   Depression with anxiety   Essential hypertension   Alcohol use   Fracture tibia/fibula, left, closed, initial encounter     #1 status post fall: Secondary to gait abnormalities from recent CVA.  Patient will need more extensive PT and OT after hospitalization.  At this point focus will be to fix his problems.  #2 left distal tibial plateau fracture with comminuted fibular fracture: Defer to general surgery.  Patient will have surgical repair.  We will monitor.  #3 history of recent CVA: We will hold Plavix secondary to his surgical intervention.  Resume after surgery.  Resume PT and OT afterwards.  #4 essential hypertension: Patient has been off antihypertensives.  He is on midodrine.  We will continue  #5 history of alcohol abuse: Stable.  Denied any recent drink.  #6 depression with anxiety: Patient on Effexor and Wellbutrin.  Continue  #7 hyperlipidemia: Continue statin.  On Lipitor 80 mg after the stroke.   DVT prophylaxis: SCD for now resume Lovenox after surgery Code Status: Full code Family Communication: Wife at bedside Disposition Plan: To be determined Consults called: Dr. Tamera Punt, orthopedic surgery Admission status: Inpatient  Severity of Illness: The appropriate patient status for this patient is INPATIENT. Inpatient status is judged to be reasonable and necessary in order to provide the required intensity of service to ensure the patient's safety. The  patient's presenting symptoms, physical exam findings, and initial radiographic and laboratory data in the context of their chronic comorbidities is felt to place them at high risk for further clinical deterioration. Furthermore, it is not anticipated that the patient will be medically stable for discharge from the hospital within 2 midnights of admission. The following factors support the patient status of inpatient.   " The patient's presenting symptoms include fall with left lower extremity injury. " The worrisome physical exam findings include angulated left lower extremity with swelling. " The initial radiographic and laboratory data are worrisome because of left tibial plateau fracture and fibula fracture. " The chronic co-morbidities include recent CVA.   * I certify that at the point of admission it is my clinical judgment that the patient will require inpatient hospital care spanning beyond 2 midnights from the point of admission due to high intensity of service, high risk for further deterioration and high frequency of surveillance required.Barbette Merino MD [facility] Pager [phone]  If 7PM-7AM, please contact night-coverage [email or web address] Password [facility]  02/05/2021, 9:23 PM

## 2021-02-05 NOTE — Therapy (Signed)
[facility] [address] [place], [place], [address] Phone: [phone]   Fax:  [phone]  Occupational Therapy Treatment  Patient Details  Name: Miguel [name] MRN: [id number] Date of Birth: September 12, 1955 Referring Provider (OT): Dr. Alysia Penna   Encounter Date: 02/05/2021   OT End of Session - 02/05/21 0905    Visit Number 6    Number of Visits 12    Date for OT Re-Evaluation 03/04/21    Authorization Type [employer], 30 visit limit combined OT/PT.  Wife reports visit limits re-start 03/05/21.    Authorization - Visit Number 6    Authorization - Number of Visits 12    OT Start Time [phone]    OT Stop Time 0930    OT Time Calculation (min) 40 min    Equipment Utilized During Treatment **FOTO**    Activity Tolerance Patient tolerated treatment well    Behavior During Therapy WFL for tasks assessed/performed           Past Medical History:  Diagnosis Date  . Cervical dystonia   . Depression   . Erectile dysfunction due to arterial insufficiency   . Functional dyspepsia   . Hypertension   . Pancreatitis 2005   likely secondary to alcohol use  . Rosacea     Past Surgical History:  Procedure Laterality Date  . no past surgery      Vitals:   02/05/21 0903 02/05/21 0942  BP: (!) 132/100 (!) 145/80     Subjective Assessment - 02/05/21 0850    Subjective  MD modified his BP meds    Pertinent History CVA  12/16/20.  PMH:  Cervical dystonia, HTN, depression, anxiety, orthostatic hypotension    Limitations orthostatic hypotension, monitor BP, fall risk, abdominal binder prn    Patient Stated Goals to be able to walk without a walker, do more at home (improve balance, be able to carry items, incr independence).    Currently in Pain? No/denies                  Treatment: Standing at sink for the following exercises : rocking forwards and back wards, modified pushups and thread the needle, 10 reps each,  minguard/ close supervision due to orthostatics/ balance issues, no loss of balance / dizziness. BP was monitored following this task  Placing grooved pegs in pegboard with right and left UE's , for increased fine motor coordination, min v.c for positioning due to ataxia, removing pegs with in hand manipulation for left  and tweezers with right. Arm bike x 8 mins level 4 for conditioning. Pt ambulated to waiting room with walker, loss of balance when sitting down, therapist assisted pt to seat.                OT Education - 02/05/21 [phone]    Education Details complex coordination tasks rotate golf ball and twirl pen    Person(s) Educated Patient    Methods Explanation;Demonstration;Verbal cues;Handout    Comprehension Verbalized understanding;Returned demonstration;Verbal cues required            OT Short Term Goals - 02/05/21 0906      OT SHORT TERM GOAL #1   Title Pt will  be independent with updated HEP (BUE coordination and LUE strength).--check STGs 02/17/21.    Time 4    Period Weeks    Status New      OT SHORT TERM GOAL #2   Title Pt will perform BADLs except bathing mod  I.    Time 4    Period Weeks    Status New      OT SHORT TERM GOAL #3   Title Pt will be able to perform simple home maintenance task in standing with supervision.    Time 4    Period Weeks    Status New             OT Long Term Goals - 01/18/21 1157      OT LONG TERM GOAL #1   Title Pt will perform simple meal prep task with supervision.    Time 6    Period Weeks    Status New      OT LONG TERM GOAL #2   Title Pt will perform simple home maintenance task mod I.    Time 6    Period Weeks    Status New      OT LONG TERM GOAL #3   Title Pt will peform environmental scanning/navigation in busy environment mod I without LOB or dizziness for community activities and incr safety    Time 6    Period Weeks    Status New      OT LONG TERM GOAL #4   Title Pt will verbalize  understanding of AE/strategies for incr safety/independence with ADLs/IADLs.    Time 6    Period Weeks    Status New      OT LONG TERM GOAL #5   Title Pt will improve UE functional use, strength, and coordination as shown by improving FOTO score to at least 62.    Time 6    Period Weeks    Status New                 Plan - 02/05/21 0908    Clinical Impression Statement Pt's BP is better today, his MD modified his medication. Pt is progressing towards goals with improving standing balance, coordination and mobility.    OT Occupational Profile and History Detailed Assessment- Review of Records and additional review of physical, cognitive, psychosocial history related to current functional performance    Occupational performance deficits (Please refer to evaluation for details): ADL's;IADL's;Work;Leisure    Body Structure / Function / Physical Skills ADL;Balance;Dexterity;Decreased knowledge of use of DME;Strength;GMC;UE functional use;IADL;Coordination;Mobility;FMC    Rehab Potential Good    Clinical Decision Making Several treatment options, min-mod task modification necessary    Comorbidities Affecting Occupational Performance: May have comorbidities impacting occupational performance    Modification or Assistance to Complete Evaluation  Min-Moderate modification of tasks or assist with assess necessary to complete eval    OT Frequency 2x / week    OT Duration 6 weeks   +eval.  Wife reports that insurace visit limits restart 03/05/21; Will reassess need for further occupational therapy after 6 weeks.   OT Treatment/Interventions Self-care/ADL training;Moist Heat;DME and/or AE instruction;Balance training;Therapeutic activities;Aquatic Therapy;Therapeutic exercise;Visual/perceptual remediation/compensation;Functional Mobility Training;Neuromuscular education;Cryotherapy;Energy conservation;Patient/family education;Manual Therapy    Plan !check BP (orthostatic);  standing balance,  core/shoulder stability, continue to add to HEP as appropriate.    Consulted and Agree with Plan of Care Patient    Family Member Consulted --           Patient will benefit from skilled therapeutic intervention in order to improve the following deficits and impairments:   Body Structure / Function / Physical Skills: ADL,Balance,Dexterity,Decreased knowledge of use of DME,Strength,GMC,UE functional use,IADL,Coordination,Mobility,FMC       Visit Diagnosis: Other lack of coordination  Ataxia  Other  symptoms and signs involving the nervous system  Other abnormalities of gait and mobility  Unsteadiness on feet    Problem List Patient Active Problem List   Diagnosis Date Noted  . Cerebrovascular accident (CVA) of right thalamus ([place]) 12/31/2020  . Cerebral infarction due to embolism of left cerebellar artery ([place]) 12/31/2020  . Alcohol use 12/19/2020  . Acute CVA (cerebrovascular accident) ([place]) 12/18/2020  . Depression with anxiety 12/18/2020  . Essential hypertension 12/18/2020  . Isolated cervical dystonia 12/06/2016    Sonnie Bias 02/05/2021, 9:44 AM Theone Murdoch, OTR/L [phone] Phone: [phone] 9:44 AM 02/05/21 [facility] [address] [place], [place], [address] Phone: [phone]   Fax:  [phone]  Name: Miguel [name] MRN: [id number] Date of Birth: [birth year]

## 2021-02-05 NOTE — Patient Instructions (Signed)
  Coordination Activities  Perform the following activities for 10 minutes 1 times per day with both hand(s).   Rotate 2 golf balls in your hand both directions   Twirl a pen between your fingers

## 2021-02-05 NOTE — Consult Note (Signed)
Reason for Consult:L Tibia fracture Referring Physician: Averey Trompeter is an 65 y.o. male.  HPI: The patient is a 65 year old gentleman who recently had a stroke approximately 2 months ago.  He tried to get up this evening and his leg buckled underneath him.  He complained of severe pain in the left lower extremity and was unable to stand on it.  He presented to the emergency department and was diagnosed with displaced left distal tibia fracture.  I was consulted for evaluation and management.  Past Medical History:  Diagnosis Date  . Cervical dystonia   . Depression   . Erectile dysfunction due to arterial insufficiency   . Functional dyspepsia   . Hypertension   . Pancreatitis 2005   likely secondary to alcohol use  . Rosacea     Past Surgical History:  Procedure Laterality Date  . no past surgery      Family History  Problem Relation Age of Onset  . Uterine cancer Mother   . Emphysema Father     Social History:  reports that he has quit smoking. He has never used smokeless tobacco. He reports current alcohol use. He reports that he does not use drugs.  Allergies:  Allergies  Allergen Reactions  . Aspirin Anaphylaxis    Childhood reaction    Medications: I have reviewed the patient's current medications.  Results for orders placed or performed during the hospital encounter of 02/05/21 (from the past 48 hour(s))  CBC with Differential     Status: Abnormal   Collection Time: 02/05/21  7:15 PM  Result Value Ref Range   WBC 4.8 4.0 - 10.5 K/uL   RBC 3.71 (L) 4.22 - 5.81 MIL/uL   Hemoglobin 12.4 (L) 13.0 - 17.0 g/dL   HCT 36.2 (L) 39.0 - 52.0 %   MCV 97.6 80.0 - 100.0 fL   MCH 33.4 26.0 - 34.0 pg   MCHC 34.3 30.0 - 36.0 g/dL   RDW 11.6 11.5 - 15.5 %   Platelets 211 150 - 400 K/uL   nRBC 0.0 0.0 - 0.2 %   Neutrophils Relative % 54 %   Neutro Abs 2.6 1.7 - 7.7 K/uL   Lymphocytes Relative 33 %   Lymphs Abs 1.6 0.7 - 4.0 K/uL   Monocytes Relative 13 %    Monocytes Absolute 0.6 0.1 - 1.0 K/uL   Eosinophils Relative 0 %   Eosinophils Absolute 0.0 0.0 - 0.5 K/uL   Basophils Relative 0 %   Basophils Absolute 0.0 0.0 - 0.1 K/uL   Immature Granulocytes 0 %   Abs Immature Granulocytes 0.01 0.00 - 0.07 K/uL    Comment: Performed at [facility], [address] [place], [place] [address]  Basic metabolic panel     Status: Abnormal   Collection Time: 02/05/21  7:15 PM  Result Value Ref Range   Sodium 132 (L) 135 - 145 mmol/L   Potassium 3.5 3.5 - 5.1 mmol/L   Chloride 97 (L) 98 - 111 mmol/L   CO2 22 22 - 32 mmol/L   Glucose, Bld 108 (H) 70 - 99 mg/dL    Comment: Glucose reference range applies only to samples taken after fasting for at least 8 hours.   BUN 11 8 - 23 mg/dL   Creatinine, Ser 0.81 0.61 - 1.24 mg/dL   Calcium 8.7 (L) 8.9 - 10.3 mg/dL   GFR, Estimated >60 >60 mL/min    Comment: (NOTE) Calculated using the CKD-EPI Creatinine Equation (2021)  Anion gap 13 5 - 15    Comment: Performed at [facility] [address] [place], [place] [address]  Protime-INR     Status: None   Collection Time: 02/05/21  7:15 PM  Result Value Ref Range   Prothrombin Time 14.3 11.4 - 15.2 seconds   INR 1.1 0.8 - 1.2    Comment: (NOTE) INR goal varies based on device and disease states. Performed at [facility], [place] [address] [place], [place] [address]   Type and screen [place]     Status: None (Preliminary result)   Collection Time: 02/05/21  7:15 PM  Result Value Ref Range   ABO/RH(D) PENDING    Antibody Screen PENDING    Sample Expiration      02/08/2021,2359 Performed at [facility], [place] [address] [place], [place] [address]     DG Tibia/Fibula Left  Result Date: 02/05/2021 CLINICAL DATA:  Pain following fall EXAM: LEFT TIBIA AND FIBULA - 2 VIEW COMPARISON:  None. FINDINGS: Frontal and lateral views obtained. There is a spiral type fracture of the distal tibial diaphysis located  approximately 0.5 cm proximal to the tibial plafond and with lateral displacement and slight posterior angulation of the distal fracture fragment with respect to the proximal fragment. There is a comminuted fracture of the distal fibular metaphysis with alignment near anatomic at this fracture site. There is also a comminuted spiral type fracture of the proximal fibular diaphysis with alignment near anatomic. No dislocations. There is mild generalized knee joint space narrowing. No erosions. IMPRESSION: 1. Spiral fracture distal tibial diaphysis with lateral displacement and mild posterior angulation of the distal fracture fragment with respect proximal fragment. 2. Comminuted fractures of the proximal and distal aspects of the fibula with alignment at both fracture sites overall type. 3.  No dislocation. 4.  Somewhat generalized narrowing of knee joint. Electronically Signed   By: Lowella Grip III M.D.   On: 02/05/2021 19:57    Review of Systems  All other systems reviewed and are negative.  Blood pressure 108/76, pulse (!) 101, temperature 98.7 F (37.1 C), temperature source Oral, resp. rate 18, SpO2 96 %. Physical Exam HENT:     Head: Atraumatic.  Eyes:     Extraocular Movements: Extraocular movements intact.  Musculoskeletal:     Comments: L LE with deformity, foot externally rotated.  Able to wiggle toes up and down, NSTLT dorsal and plantar foot, compartments soft. Palp DP and PT pulses  Neurological:     Mental Status: He is alert.     Assessment/Plan: L distal third tibia fracture Plan admit to medicine Recommend surgical fixation to allow early mobilization and anatomic alignment Risks/benefits discussed NPO p MN for possible surgery tomorrow vs Sunday Splint to be applied by ortho tech Hold plavix Plan IMN  Isabella Stalling 02/05/2021, 9:04 PM

## 2021-02-06 DIAGNOSIS — W19XXXA Unspecified fall, initial encounter: Secondary | ICD-10-CM

## 2021-02-06 DIAGNOSIS — I10 Essential (primary) hypertension: Secondary | ICD-10-CM

## 2021-02-06 LAB — COMPREHENSIVE METABOLIC PANEL
ALT: 30 U/L (ref 0–44)
AST: 20 U/L (ref 15–41)
Albumin: 3.5 g/dL (ref 3.5–5.0)
Alkaline Phosphatase: 69 U/L (ref 38–126)
Anion gap: 8 (ref 5–15)
BUN: 7 mg/dL — ABNORMAL LOW (ref 8–23)
CO2: 25 mmol/L (ref 22–32)
Calcium: 8.7 mg/dL — ABNORMAL LOW (ref 8.9–10.3)
Chloride: 102 mmol/L (ref 98–111)
Creatinine, Ser: 0.66 mg/dL (ref 0.61–1.24)
GFR, Estimated: >60 mL/min (ref >60)
Glucose, Bld: 117 mg/dL — ABNORMAL HIGH (ref 70–99)
Potassium: 3.5 mmol/L (ref 3.5–5.1)
Sodium: 135 mmol/L (ref 135–145)
Total Bilirubin: 1 mg/dL (ref 0.3–1.2)
Total Protein: 5.7 g/dL — ABNORMAL LOW (ref 6.5–8.1)

## 2021-02-06 LAB — CBC
HCT: 33.7 % — ABNORMAL LOW (ref 39.0–52.0)
Hemoglobin: 11.8 g/dL — ABNORMAL LOW (ref 13.0–17.0)
MCH: 33.9 pg (ref 26.0–34.0)
MCHC: 35 g/dL (ref 30.0–36.0)
MCV: 96.8 fL (ref 80.0–100.0)
Platelets: 174 10*3/uL (ref 150–400)
RBC: 3.48 MIL/uL — ABNORMAL LOW (ref 4.22–5.81)
RDW: 11.8 % (ref 11.5–15.5)
WBC: 4.6 10*3/uL (ref 4.0–10.5)
nRBC: 0 % (ref 0.0–0.2)

## 2021-02-06 LAB — ABO/RH: ABO/RH(D): O POS

## 2021-02-06 LAB — SURGICAL PCR SCREEN
MRSA, PCR: NEGATIVE
Staphylococcus aureus: NEGATIVE

## 2021-02-06 MED ORDER — ONDANSETRON HCL 4 MG/2ML IJ SOLN: 4.0000 mg | Freq: Four times a day (QID) | INTRAMUSCULAR | Status: DC | PRN

## 2021-02-06 MED ORDER — HYDROMORPHONE HCL 1 MG/ML IJ SOLN
0.5000 mg | INTRAMUSCULAR | Status: DC | PRN
  Administered 2021-02-06 – 2021-02-07 (×6): 1 mg via INTRAVENOUS
  Filled 2021-02-06 (×6): qty 1

## 2021-02-06 MED ORDER — PANTOPRAZOLE SODIUM 40 MG PO TBEC
40.0000 mg | DELAYED_RELEASE_TABLET | Freq: Every day | ORAL | Status: DC
  Filled 2021-02-06 (×3): qty 1

## 2021-02-06 MED ORDER — VENLAFAXINE HCL ER 75 MG PO CP24
75.0000 mg | ORAL_CAPSULE | Freq: Every day | ORAL | Status: DC
  Administered 2021-02-06 – 2021-02-09 (×3): 75 mg via ORAL
  Filled 2021-02-06 (×4): qty 1

## 2021-02-06 MED ORDER — CEFAZOLIN SODIUM-DEXTROSE 2-4 GM/100ML-% IV SOLN
2.0000 g | INTRAVENOUS | Status: DC
  Filled 2021-02-06 (×2): qty 100

## 2021-02-06 MED ORDER — MELATONIN 3 MG PO TABS
3.0000 mg | ORAL_TABLET | Freq: Every evening | ORAL | Status: DC | PRN
  Filled 2021-02-06 (×2): qty 1

## 2021-02-06 MED ORDER — CLONAZEPAM 0.5 MG PO TABS
1.0000 mg | ORAL_TABLET | Freq: Every day | ORAL | Status: DC
  Administered 2021-02-07 – 2021-02-09 (×3): 1 mg via ORAL
  Filled 2021-02-06 (×3): qty 2

## 2021-02-06 MED ORDER — ADULT MULTIVITAMIN W/MINERALS CH
1.0000 | ORAL_TABLET | Freq: Every day | ORAL | Status: DC
  Administered 2021-02-07 – 2021-02-09 (×3): 1 via ORAL
  Filled 2021-02-06 (×3): qty 1

## 2021-02-06 MED ORDER — LACTATED RINGERS IV SOLN: INTRAVENOUS | Status: DC

## 2021-02-06 MED ORDER — ENOXAPARIN SODIUM 40 MG/0.4ML IJ SOSY
40.0000 mg | PREFILLED_SYRINGE | Freq: Once | INTRAMUSCULAR | Status: AC
  Administered 2021-02-06: 40 mg via SUBCUTANEOUS
  Filled 2021-02-06: qty 0.4

## 2021-02-06 MED ORDER — BUPROPION HCL ER (SR) 150 MG PO TB12
150.0000 mg | ORAL_TABLET | Freq: Every day | ORAL | Status: DC
  Administered 2021-02-06 – 2021-02-09 (×4): 150 mg via ORAL
  Filled 2021-02-06 (×5): qty 1

## 2021-02-06 MED ORDER — POVIDONE-IODINE 10 % EX SWAB: 2.0000 "application " | Freq: Once | CUTANEOUS | Status: DC

## 2021-02-06 MED ORDER — ONDANSETRON HCL 4 MG PO TABS: 4.0000 mg | ORAL_TABLET | Freq: Four times a day (QID) | ORAL | Status: DC | PRN

## 2021-02-06 MED ORDER — MUPIROCIN 2 % EX OINT
1.0000 "application " | TOPICAL_OINTMENT | Freq: Two times a day (BID) | CUTANEOUS | Status: DC
  Administered 2021-02-06: 1 via NASAL
  Filled 2021-02-06: qty 22

## 2021-02-06 MED ORDER — CALCIUM CARBONATE ANTACID 500 MG PO CHEW: 1.0000 | CHEWABLE_TABLET | ORAL | Status: DC | PRN

## 2021-02-06 MED ORDER — VITAMIN D 25 MCG (1000 UNIT) PO TABS
2000.0000 [IU] | ORAL_TABLET | Freq: Every day | ORAL | Status: DC
  Administered 2021-02-06 – 2021-02-09 (×4): 2000 [IU] via ORAL
  Filled 2021-02-06 (×4): qty 2

## 2021-02-06 MED ORDER — MIDODRINE HCL 5 MG PO TABS
10.0000 mg | ORAL_TABLET | Freq: Three times a day (TID) | ORAL | Status: DC
  Administered 2021-02-06: 10 mg via ORAL
  Administered 2021-02-08 (×2): 5 mg via ORAL
  Administered 2021-02-09: 10 mg via ORAL
  Filled 2021-02-06 (×4): qty 2

## 2021-02-06 MED ORDER — ACETAMINOPHEN 500 MG PO TABS: 500.0000 mg | ORAL_TABLET | Freq: Four times a day (QID) | ORAL | Status: DC | PRN

## 2021-02-06 MED ORDER — ATORVASTATIN CALCIUM 80 MG PO TABS
80.0000 mg | ORAL_TABLET | Freq: Every day | ORAL | Status: DC
  Administered 2021-02-06 – 2021-02-09 (×3): 80 mg via ORAL
  Filled 2021-02-06 (×4): qty 1

## 2021-02-06 NOTE — Progress Notes (Signed)
PROGRESS NOTE    Miguel [name]  ZMO:[id number] DOB: Jan 30, 1956 DOA: 02/05/2021 PCP: Josetta Huddle, MD     Brief Narrative:  Miguel [name] is a 65 y.o. male with medical history significant of recent CVA about 2 months ago who is recuperating at home with mild right-sided hemiparesis, essential hypertension, depression, hyperlipidemia, previous alcohol use who is currently on Plavix following his CVA.  Patient has been doing great with physical therapy.  Apparently he was downgraded from using the walker to a cane.  He tried to use the cane and be independent.  Wife has told patient to wait for her surgical assistant to get up but before she could get back he tried to do it on his own.  His leg gave out underneath him twisted under him as he heard a crack when he sat on the left leg.  He was brought into the ER where he was found to have distal tibial fracture with some fractures of the fibula on the left side.  Orthopedic surgery consulted.  New events last 24 hours / Subjective: Patient reports pain of the left leg, well controlled with receiving pain medication this morning.  Denies any chest pain, cough, shortness of breath, nausea, vomiting, abdominal pain.  Assessment & Plan:   Principal Problem:   Fall Active Problems:   Acute CVA (cerebrovascular accident) ([place])   Depression with anxiety   Essential hypertension   Alcohol use   Fracture tibia/fibula, left, closed, initial encounter   Left distal tib-fib fracture status post mechanical fall -Orthopedic surgery following, planning for surgical intervention -Pain control, he will need PT OT postsurgery  History of recent CVA -Hold Plavix pending surgical intervention, resume as soon as cleared by surgical team -Continue Lipitor  Orthostatic hypotension -Continue midodrine  Depression, anxiety -Continue Effexor, Wellbutrin, klonopin    DVT prophylaxis:  SCDs Start: 02/06/21 0021  Code Status:     Code Status Orders  (From  admission, onward)         Start     Ordered   02/06/21 0021  Full code  Continuous        02/06/21 0022        Code Status History    Date Active Date Inactive Code Status Order ID Comments User Context   02/05/2021 2212 02/06/2021 0022 Full Code [id number]  Elwyn Reach, MD ED   12/31/2020 1424 01/14/2021 1715 Full Code [id number]  Cathlyn Parsons, PA-C Inpatient   12/31/2020 1424 12/31/2020 1424 Full Code [id number]  Cathlyn Parsons, PA-C Inpatient   12/18/2020 1136 12/31/2020 1413 Full Code [id number]  Shelda Pal, DO ED   Advance Care Planning Activity     Family Communication: At bedside  Disposition Plan:  Status is: Inpatient  Remains inpatient appropriate because:Ongoing diagnostic testing needed not appropriate for outpatient work up   Dispo: The patient is from: Home              Anticipated d/c is to: TBD              Patient currently is not medically stable to d/c.   Difficult to place patient No      Consultants:   Orthopedic surgery   Procedures:   None   Antimicrobials:  Anti-infectives (From admission, onward)   Start     Dose/Rate Route Frequency Ordered Stop   02/06/21 1400  ceFAZolin (ANCEF) IVPB 2g/100 mL premix        2 g 200 mL/hr over  30 Minutes Intravenous On call to O.R. 02/06/21 0816 02/07/21 0559        Objective: Vitals:   02/06/21 0900 02/06/21 0915 02/06/21 1017 02/06/21 1018  BP: (!) 143/85 (!) 146/88 (!) 155/99 (!) 155/99  Pulse: 71 70 65 66  Resp: 20  12 12   Temp:  98 F (36.7 C) 98 F (36.7 C) 98 F (36.7 C)  TempSrc:      SpO2: 94% 94% 95% 99%   No intake or output data in the 24 hours ending 02/06/21 1141 There were no vitals filed for this visit.  Examination:  General exam: Appears calm and comfortable  Respiratory system: Clear to auscultation. Respiratory effort normal. No respiratory distress. No conversational dyspnea.  Cardiovascular system: S1 & S2 heard, RRR. No murmurs. No pedal  edema. Gastrointestinal system: Abdomen is nondistended, soft and nontender. Normal bowel sounds heard. Central nervous system: Alert and oriented. No focal neurological deficits. Speech clear.  Extremities: Left leg in brace  Skin: No rashes, lesions or ulcers on exposed skin  Psychiatry: Judgement and insight appear normal. Mood & affect appropriate.   Data Reviewed: I have personally reviewed following labs and imaging studies  CBC: Recent Labs  Lab 02/05/21 1915 02/06/21 0400  WBC 4.8 4.6  NEUTROABS 2.6  --   HGB 12.4* 11.8*  HCT 36.2* 33.7*  MCV 97.6 96.8  PLT 211 093   Basic Metabolic Panel: Recent Labs  Lab 02/05/21 1915 02/06/21 0400  NA 132* 135  K 3.5 3.5  CL 97* 102  CO2 22 25  GLUCOSE 108* 117*  BUN 11 7*  CREATININE 0.81 0.66  CALCIUM 8.7* 8.7*   GFR: Estimated Creatinine Clearance: 102.4 mL/min (by C-G formula based on SCr of 0.66 mg/dL). Liver Function Tests: Recent Labs  Lab 02/06/21 0400  AST 20  ALT 30  ALKPHOS 69  BILITOT 1.0  PROT 5.7*  ALBUMIN 3.5   No results for input(s): LIPASE, AMYLASE in the last 168 hours. No results for input(s): AMMONIA in the last 168 hours. Coagulation Profile: Recent Labs  Lab 02/05/21 1915  INR 1.1   Cardiac Enzymes: No results for input(s): CKTOTAL, CKMB, CKMBINDEX, TROPONINI in the last 168 hours. BNP (last 3 results) No results for input(s): PROBNP in the last 8760 hours. HbA1C: No results for input(s): HGBA1C in the last 72 hours. CBG: No results for input(s): GLUCAP in the last 168 hours. Lipid Profile: No results for input(s): CHOL, HDL, LDLCALC, TRIG, CHOLHDL, LDLDIRECT in the last 72 hours. Thyroid Function Tests: No results for input(s): TSH, T4TOTAL, FREET4, T3FREE, THYROIDAB in the last 72 hours. Anemia Panel: No results for input(s): VITAMINB12, FOLATE, FERRITIN, TIBC, IRON, RETICCTPCT in the last 72 hours. Sepsis Labs: No results for input(s): PROCALCITON, LATICACIDVEN in the last 168  hours.  Recent Results (from the past 240 hour(s))  Resp Panel by RT-PCR (Flu A&B, Covid) Nasopharyngeal Swab     Status: None   Collection Time: 02/05/21  8:40 PM   Specimen: Nasopharyngeal Swab; Nasopharyngeal(NP) swabs in vial transport medium  Result Value Ref Range Status   SARS Coronavirus 2 by RT PCR NEGATIVE NEGATIVE Final    Comment: (NOTE) SARS-CoV-2 target nucleic acids are NOT DETECTED.  The SARS-CoV-2 RNA is generally detectable in upper respiratory specimens during the acute phase of infection. The lowest concentration of SARS-CoV-2 viral copies this assay can detect is 138 copies/mL. A negative result does not preclude SARS-Cov-2 infection and should not be used as the sole basis  for treatment or other patient management decisions. A negative result may occur with  improper specimen collection/handling, submission of specimen other than nasopharyngeal swab, presence of viral mutation(s) within the areas targeted by this assay, and inadequate number of viral copies(<138 copies/mL). A negative result must be combined with clinical observations, patient history, and epidemiological information. The expected result is Negative.  Fact Sheet for Patients:  EntrepreneurPulse.com.au  Fact Sheet for Healthcare Providers:  IncredibleEmployment.be  This test is no t yet approved or cleared by the [place] FDA and  has been authorized for detection and/or diagnosis of SARS-CoV-2 by FDA under an Emergency Use Authorization (EUA). This EUA will remain  in effect (meaning this test can be used) for the duration of the COVID-19 declaration under Section 564(b)(1) of the Act, 21 U.S.C.section 360bbb-3(b)(1), unless the authorization is terminated  or revoked sooner.       Influenza A by PCR NEGATIVE NEGATIVE Final   Influenza B by PCR NEGATIVE NEGATIVE Final    Comment: (NOTE) The Xpert Xpress SARS-CoV-2/FLU/RSV plus assay is intended  as an aid in the diagnosis of influenza from Nasopharyngeal swab specimens and should not be used as a sole basis for treatment. Nasal washings and aspirates are unacceptable for Xpert Xpress SARS-CoV-2/FLU/RSV testing.  Fact Sheet for Patients: EntrepreneurPulse.com.au  Fact Sheet for Healthcare Providers: IncredibleEmployment.be  This test is not yet approved or cleared by the [place] FDA and has been authorized for detection and/or diagnosis of SARS-CoV-2 by FDA under an Emergency Use Authorization (EUA). This EUA will remain in effect (meaning this test can be used) for the duration of the COVID-19 declaration under Section 564(b)(1) of the Act, 21 U.S.C. section 360bbb-3(b)(1), unless the authorization is terminated or revoked.  Performed at [facility], [place] [address] [place], [place] [address]       Radiology Studies: DG Tibia/Fibula Left  Result Date: 02/05/2021 CLINICAL DATA:  Pain following fall EXAM: LEFT TIBIA AND FIBULA - 2 VIEW COMPARISON:  None. FINDINGS: Frontal and lateral views obtained. There is a spiral type fracture of the distal tibial diaphysis located approximately 0.5 cm proximal to the tibial plafond and with lateral displacement and slight posterior angulation of the distal fracture fragment with respect to the proximal fragment. There is a comminuted fracture of the distal fibular metaphysis with alignment near anatomic at this fracture site. There is also a comminuted spiral type fracture of the proximal fibular diaphysis with alignment near anatomic. No dislocations. There is mild generalized knee joint space narrowing. No erosions. IMPRESSION: 1. Spiral fracture distal tibial diaphysis with lateral displacement and mild posterior angulation of the distal fracture fragment with respect proximal fragment. 2. Comminuted fractures of the proximal and distal aspects of the fibula with alignment at both fracture  sites overall type. 3.  No dislocation. 4.  Somewhat generalized narrowing of knee joint. Electronically Signed   By: Lowella Grip III M.D.   On: 02/05/2021 19:57      Scheduled Meds: . atorvastatin  80 mg Oral Daily  . buPROPion  150 mg Oral Daily  . cholecalciferol  2,000 Units Oral Daily  . clonazePAM  1 mg Oral Daily  . midodrine  10 mg Oral TID WC  . multivitamin with minerals  1 tablet Oral Daily  . pantoprazole  40 mg Oral Daily  . povidone-iodine  2 application Topical Once  . venlafaxine XR  75 mg Oral Q breakfast   Continuous Infusions: . sodium chloride Stopped (02/06/21  9021)  .  ceFAZolin (ANCEF) IV    . lactated ringers 100 mL/hr at 02/06/21 0252     LOS: 1 day      Time spent: 25 minutes   Dessa Phi, DO [facility] 02/06/2021, 11:41 AM   Available via Epic secure chat 7am-7pm After these hours, please refer to coverage provider listed on [email or web address]

## 2021-02-06 NOTE — ED Notes (Signed)
Attempted to call report. Charge nurse still has not assigned patient to room and RN. Will call back in 10 min.

## 2021-02-06 NOTE — Progress Notes (Signed)
   PATIENT ID: York Ram     Procedure(s) (LRB): INTRAMEDULLARY (IM) NAIL TIBIAL (Left)  Subjective: comfortable, no new c/o  Objective:  Vitals:   02/06/21 1017 02/06/21 1018  BP: (!) 155/99 (!) 155/99  Pulse: 65 66  Resp: 12 12  Temp: 98 F (36.7 C) 98 F (36.7 C)  SpO2: 95% 99%     LLE splint intact, NVID  Labs:  Recent Labs    02/05/21 1915 02/06/21 0400  HGB 12.4* 11.8*   Recent Labs    02/05/21 1915 02/06/21 0400  WBC 4.8 4.6  RBC 3.71* 3.48*  HCT 36.2* 33.7*  PLT 211 174   Recent Labs    02/05/21 1915 02/06/21 0400  NA 132* 135  K 3.5 3.5  CL 97* 102  CO2 22 25  BUN 11 7*  CREATININE 0.81 0.66  GLUCOSE 108* 117*  CALCIUM 8.7* 8.7*    Assessment and Plan:L closed tibia fracture Plan to have Dr. Marcelino Scot fix tomorrow am Ok to eat for now, NPO past MN Will give dose of lovenox today, but cont to hold Plavix

## 2021-02-06 NOTE — ED Notes (Signed)
Attempted to call report. Charge RN to assign pt to room and RN. Will call back.

## 2021-02-06 NOTE — Progress Notes (Signed)
   PATIENT ID: Miguel [name]     Procedure(s) (LRB): INTRAMEDULLARY (IM) NAIL TIBIAL (Left)  Subjective: No new c/o.  Pain controlled, leg splinted.  Objective:  Vitals:   02/06/21 0700 02/06/21 0715  BP: (!) 150/88 (!) 147/89  Pulse: 70 73  Resp:    Temp:    SpO2: 93% 94%     Comfortable lying in bed LLE splinted in good position.  Wiggle toes up and down NSTLT dorsal and plantar foot, CR <2 sec  Labs:  Recent Labs    02/05/21 1915 02/06/21 0400  HGB 12.4* 11.8*   Recent Labs    02/05/21 1915 02/06/21 0400  WBC 4.8 4.6  RBC 3.71* 3.48*  HCT 36.2* 33.7*  PLT 211 174   Recent Labs    02/05/21 1915 02/06/21 0400  NA 132* 135  K 3.5 3.5  CL 97* 102  CO2 22 25  BUN 11 7*  CREATININE 0.81 0.66  GLUCOSE 108* 117*  CALCIUM 8.7* 8.7*    Assessment and Plan: L tibia fracture Plan L tib IMN, likely this afternoon Keep NPO Risks benefits discussed Hold plavix

## 2021-02-07 ENCOUNTER — Inpatient Hospital Stay (HOSPITAL_COMMUNITY): Payer: BC Managed Care – PPO

## 2021-02-07 ENCOUNTER — Inpatient Hospital Stay (HOSPITAL_COMMUNITY): Payer: BC Managed Care – PPO | Admitting: Certified Registered"

## 2021-02-07 ENCOUNTER — Encounter (HOSPITAL_COMMUNITY)
Admission: EM | Disposition: A | Discharge status: Home Health | Payer: Self-pay | Source: Home / Self Care | Attending: Internal Medicine

## 2021-02-07 ENCOUNTER — Encounter (HOSPITAL_COMMUNITY): Payer: Self-pay | Admitting: Internal Medicine

## 2021-02-07 HISTORY — PX: TIBIA IM NAIL INSERTION: SHX2516

## 2021-02-07 LAB — BASIC METABOLIC PANEL
Anion gap: 9 (ref 5–15)
BUN: 7 mg/dL — ABNORMAL LOW (ref 8–23)
CO2: 27 mmol/L (ref 22–32)
Calcium: 9 mg/dL (ref 8.9–10.3)
Chloride: 101 mmol/L (ref 98–111)
Creatinine, Ser: 0.76 mg/dL (ref 0.61–1.24)
GFR, Estimated: >60 mL/min (ref >60)
Glucose, Bld: 144 mg/dL — ABNORMAL HIGH (ref 70–99)
Potassium: 4.3 mmol/L (ref 3.5–5.1)
Sodium: 137 mmol/L (ref 135–145)

## 2021-02-07 LAB — CBC
HCT: 35 % — ABNORMAL LOW (ref 39.0–52.0)
Hemoglobin: 12.1 g/dL — ABNORMAL LOW (ref 13.0–17.0)
MCH: 33.5 pg (ref 26.0–34.0)
MCHC: 34.6 g/dL (ref 30.0–36.0)
MCV: 97 fL (ref 80.0–100.0)
Platelets: 164 10*3/uL (ref 150–400)
RBC: 3.61 MIL/uL — ABNORMAL LOW (ref 4.22–5.81)
RDW: 11.6 % (ref 11.5–15.5)
WBC: 5.1 10*3/uL (ref 4.0–10.5)
nRBC: 0 % (ref 0.0–0.2)

## 2021-02-07 LAB — VITAMIN D 25 HYDROXY (VIT D DEFICIENCY, FRACTURES): Vit D, 25-Hydroxy: 48.8 ng/mL (ref 30–100)

## 2021-02-07 SURGERY — INSERTION, INTRAMEDULLARY ROD, TIBIA
Anesthesia: General | Laterality: Left

## 2021-02-07 MED ORDER — FENTANYL CITRATE (PF) 250 MCG/5ML IJ SOLN
INTRAMUSCULAR | Status: AC
  Filled 2021-02-07: qty 5

## 2021-02-07 MED ORDER — CHLORHEXIDINE GLUCONATE 0.12 % MT SOLN
OROMUCOSAL | Status: AC
  Administered 2021-02-07: 15 mL
  Filled 2021-02-07: qty 15

## 2021-02-07 MED ORDER — 0.9 % SODIUM CHLORIDE (POUR BTL) OPTIME
TOPICAL | Status: DC | PRN
  Administered 2021-02-07: 1000 mL

## 2021-02-07 MED ORDER — DEXMEDETOMIDINE (PRECEDEX) IN NS 20 MCG/5ML (4 MCG/ML) IV SYRINGE
PREFILLED_SYRINGE | INTRAVENOUS | Status: AC
  Filled 2021-02-07: qty 5

## 2021-02-07 MED ORDER — CLOPIDOGREL BISULFATE 75 MG PO TABS
75.0000 mg | ORAL_TABLET | Freq: Every day | ORAL | Status: DC
  Administered 2021-02-08 – 2021-02-09 (×2): 75 mg via ORAL
  Filled 2021-02-07 (×2): qty 1

## 2021-02-07 MED ORDER — LIDOCAINE 2% (20 MG/ML) 5 ML SYRINGE
INTRAMUSCULAR | Status: DC | PRN
  Administered 2021-02-07: 80 mg via INTRAVENOUS

## 2021-02-07 MED ORDER — MELATONIN 3 MG PO TABS
3.0000 mg | ORAL_TABLET | Freq: Every evening | ORAL | Status: DC | PRN
  Administered 2021-02-07: 3 mg via ORAL
  Filled 2021-02-07: qty 1

## 2021-02-07 MED ORDER — CHLORHEXIDINE GLUCONATE 0.12 % MT SOLN: 15.0000 mL | Freq: Once | OROMUCOSAL | Status: DC

## 2021-02-07 MED ORDER — METOCLOPRAMIDE HCL 5 MG PO TABS: 5.0000 mg | ORAL_TABLET | Freq: Three times a day (TID) | ORAL | Status: DC | PRN

## 2021-02-07 MED ORDER — PHENYLEPHRINE HCL-NACL 10-0.9 MG/250ML-% IV SOLN
INTRAVENOUS | Status: DC | PRN
  Administered 2021-02-07: 30 ug/min via INTRAVENOUS

## 2021-02-07 MED ORDER — MIDAZOLAM HCL 2 MG/2ML IJ SOLN
INTRAMUSCULAR | Status: AC
  Filled 2021-02-07: qty 2

## 2021-02-07 MED ORDER — DOCUSATE SODIUM 100 MG PO CAPS
100.0000 mg | ORAL_CAPSULE | Freq: Two times a day (BID) | ORAL | Status: DC
  Filled 2021-02-07 (×4): qty 1

## 2021-02-07 MED ORDER — CEFAZOLIN SODIUM-DEXTROSE 2-4 GM/100ML-% IV SOLN
2.0000 g | Freq: Four times a day (QID) | INTRAVENOUS | Status: AC
  Administered 2021-02-07 – 2021-02-08 (×3): 2 g via INTRAVENOUS
  Filled 2021-02-07 (×3): qty 100

## 2021-02-07 MED ORDER — ONDANSETRON HCL 4 MG/2ML IJ SOLN: 4.0000 mg | Freq: Four times a day (QID) | INTRAMUSCULAR | Status: DC | PRN

## 2021-02-07 MED ORDER — LACTATED RINGERS IV SOLN: INTRAVENOUS | Status: DC

## 2021-02-07 MED ORDER — LACTATED RINGERS IV SOLN: INTRAVENOUS | Status: DC | PRN

## 2021-02-07 MED ORDER — FENTANYL CITRATE (PF) 250 MCG/5ML IJ SOLN
INTRAMUSCULAR | Status: DC | PRN
  Administered 2021-02-07: 50 ug via INTRAVENOUS
  Administered 2021-02-07: 100 ug via INTRAVENOUS
  Administered 2021-02-07 (×5): 50 ug via INTRAVENOUS

## 2021-02-07 MED ORDER — HYDROCODONE-ACETAMINOPHEN 5-325 MG PO TABS
1.0000 | ORAL_TABLET | Freq: Four times a day (QID) | ORAL | Status: DC | PRN
  Administered 2021-02-08: 2 via ORAL
  Filled 2021-02-07: qty 2

## 2021-02-07 MED ORDER — ORAL CARE MOUTH RINSE: 15.0000 mL | Freq: Once | OROMUCOSAL | Status: DC

## 2021-02-07 MED ORDER — LIDOCAINE 2% (20 MG/ML) 5 ML SYRINGE
INTRAMUSCULAR | Status: AC
  Filled 2021-02-07: qty 5

## 2021-02-07 MED ORDER — DEXAMETHASONE SODIUM PHOSPHATE 10 MG/ML IJ SOLN
INTRAMUSCULAR | Status: DC | PRN
  Administered 2021-02-07: 5 mg via INTRAVENOUS

## 2021-02-07 MED ORDER — MORPHINE SULFATE (PF) 2 MG/ML IV SOLN: 0.5000 mg | INTRAVENOUS | Status: DC | PRN

## 2021-02-07 MED ORDER — ACETAMINOPHEN 10 MG/ML IV SOLN
INTRAVENOUS | Status: AC
  Filled 2021-02-07: qty 100

## 2021-02-07 MED ORDER — ROCURONIUM BROMIDE 10 MG/ML (PF) SYRINGE
PREFILLED_SYRINGE | INTRAVENOUS | Status: AC
  Filled 2021-02-07: qty 10

## 2021-02-07 MED ORDER — PHENYLEPHRINE 40 MCG/ML (10ML) SYRINGE FOR IV PUSH (FOR BLOOD PRESSURE SUPPORT)
PREFILLED_SYRINGE | INTRAVENOUS | Status: AC
  Filled 2021-02-07: qty 10

## 2021-02-07 MED ORDER — PROPOFOL 10 MG/ML IV BOLUS
INTRAVENOUS | Status: DC | PRN
  Administered 2021-02-07: 170 mg via INTRAVENOUS

## 2021-02-07 MED ORDER — MIDAZOLAM HCL 2 MG/2ML IJ SOLN
INTRAMUSCULAR | Status: DC | PRN
  Administered 2021-02-07: 2 mg via INTRAVENOUS

## 2021-02-07 MED ORDER — PHENYLEPHRINE 40 MCG/ML (10ML) SYRINGE FOR IV PUSH (FOR BLOOD PRESSURE SUPPORT)
PREFILLED_SYRINGE | INTRAVENOUS | Status: DC | PRN
  Administered 2021-02-07 (×2): 120 ug via INTRAVENOUS

## 2021-02-07 MED ORDER — CEFAZOLIN SODIUM-DEXTROSE 2-3 GM-%(50ML) IV SOLR
INTRAVENOUS | Status: DC | PRN
  Administered 2021-02-07: 2 g via INTRAVENOUS

## 2021-02-07 MED ORDER — ONDANSETRON HCL 4 MG PO TABS: 4.0000 mg | ORAL_TABLET | Freq: Four times a day (QID) | ORAL | Status: DC | PRN

## 2021-02-07 MED ORDER — PROPOFOL 10 MG/ML IV BOLUS
INTRAVENOUS | Status: AC
  Filled 2021-02-07: qty 20

## 2021-02-07 MED ORDER — ONDANSETRON HCL 4 MG/2ML IJ SOLN
INTRAMUSCULAR | Status: AC
  Filled 2021-02-07: qty 2

## 2021-02-07 MED ORDER — DEXMEDETOMIDINE (PRECEDEX) IN NS 20 MCG/5ML (4 MCG/ML) IV SYRINGE
PREFILLED_SYRINGE | INTRAVENOUS | Status: DC | PRN
Start: 2021-02-07 — End: 2021-02-07
  Administered 2021-02-07: 20 ug via INTRAVENOUS

## 2021-02-07 MED ORDER — POTASSIUM CHLORIDE IN NACL 20-0.9 MEQ/L-% IV SOLN
INTRAVENOUS | Status: DC
  Filled 2021-02-07 (×2): qty 1000

## 2021-02-07 MED ORDER — DEXAMETHASONE SODIUM PHOSPHATE 10 MG/ML IJ SOLN
INTRAMUSCULAR | Status: AC
  Filled 2021-02-07: qty 1

## 2021-02-07 MED ORDER — METOCLOPRAMIDE HCL 5 MG/ML IJ SOLN: 5.0000 mg | Freq: Three times a day (TID) | INTRAMUSCULAR | Status: DC | PRN

## 2021-02-07 MED ORDER — SUGAMMADEX SODIUM 200 MG/2ML IV SOLN
INTRAVENOUS | Status: DC | PRN
  Administered 2021-02-07: 200 mg via INTRAVENOUS

## 2021-02-07 MED ORDER — ACETAMINOPHEN 10 MG/ML IV SOLN
INTRAVENOUS | Status: DC | PRN
  Administered 2021-02-07: 1000 mg via INTRAVENOUS

## 2021-02-07 MED ORDER — ROCURONIUM BROMIDE 10 MG/ML (PF) SYRINGE
PREFILLED_SYRINGE | INTRAVENOUS | Status: DC | PRN
  Administered 2021-02-07: 60 mg via INTRAVENOUS
  Administered 2021-02-07: 40 mg via INTRAVENOUS

## 2021-02-07 MED ORDER — ONDANSETRON HCL 4 MG/2ML IJ SOLN
INTRAMUSCULAR | Status: DC | PRN
  Administered 2021-02-07: 4 mg via INTRAVENOUS

## 2021-02-07 MED ORDER — ACETAMINOPHEN 500 MG PO TABS
500.0000 mg | ORAL_TABLET | Freq: Three times a day (TID) | ORAL | Status: DC
  Administered 2021-02-07 – 2021-02-08 (×4): 500 mg via ORAL
  Filled 2021-02-07 (×5): qty 1

## 2021-02-07 MED ORDER — ACETAMINOPHEN 325 MG PO TABS: 325.0000 mg | ORAL_TABLET | Freq: Four times a day (QID) | ORAL | Status: DC | PRN

## 2021-02-07 SURGICAL SUPPLY — 52 items
BIT DRILL 3.8X6 NS (BIT) ×1 IMPLANT
BIT DRILL 4.4 NS (BIT) ×1 IMPLANT
BLADE SURG 10 STRL SS (BLADE) ×2 IMPLANT
BNDG ELASTIC 4X5.8 VLCR STR LF (GAUZE/BANDAGES/DRESSINGS) ×2 IMPLANT
BNDG ELASTIC 6X5.8 VLCR STR LF (GAUZE/BANDAGES/DRESSINGS) ×2 IMPLANT
BNDG GAUZE ELAST 4 BULKY (GAUZE/BANDAGES/DRESSINGS) ×1 IMPLANT
BRUSH SCRUB EZ PLAIN DRY (MISCELLANEOUS) ×3 IMPLANT
COVER SURGICAL LIGHT HANDLE (MISCELLANEOUS) ×3 IMPLANT
DRAPE C-ARM 42X72 X-RAY (DRAPES) ×2 IMPLANT
DRAPE C-ARMOR (DRAPES) ×2 IMPLANT
DRAPE INCISE IOBAN 66X45 STRL (DRAPES) ×1 IMPLANT
DRAPE U-SHAPE 47X51 STRL (DRAPES) ×2 IMPLANT
DRSG ADAPTIC 3X8 NADH LF (GAUZE/BANDAGES/DRESSINGS) ×1 IMPLANT
DRSG MEPITEL 4X7.2 (GAUZE/BANDAGES/DRESSINGS) ×2 IMPLANT
DRSG PAD ABDOMINAL 8X10 ST (GAUZE/BANDAGES/DRESSINGS) ×2 IMPLANT
ELECT REM PT RETURN 9FT ADLT (ELECTROSURGICAL) ×2
ELECTRODE REM PT RTRN 9FT ADLT (ELECTROSURGICAL) ×1 IMPLANT
GAUZE SPONGE 4X4 12PLY STRL (GAUZE/BANDAGES/DRESSINGS) ×2 IMPLANT
GLOVE BIO SURGEON STRL SZ8 (GLOVE) ×2 IMPLANT
GLOVE BIO SURGEON STRL SZ8.5 (GLOVE) ×2 IMPLANT
GLOVE SRG 8 PF TXTR STRL LF DI (GLOVE) ×1 IMPLANT
GLOVE SURG UNDER POLY LF SZ8 (GLOVE) ×2
GOWN STRL REUS W/ TWL LRG LVL3 (GOWN DISPOSABLE) ×2 IMPLANT
GOWN STRL REUS W/ TWL XL LVL3 (GOWN DISPOSABLE) ×1 IMPLANT
GOWN STRL REUS W/TWL LRG LVL3 (GOWN DISPOSABLE) ×4
GOWN STRL REUS W/TWL XL LVL3 (GOWN DISPOSABLE) ×4
GUIDEPIN 3.2X17.5 THRD DISP (PIN) ×1 IMPLANT
GUIDEWIRE BALL NOSE 80CM (WIRE) ×1 IMPLANT
KIT BASIN OR (CUSTOM PROCEDURE TRAY) ×2 IMPLANT
KIT TURNOVER KIT B (KITS) ×2 IMPLANT
NAIL TIBIAL 10X40.5 (Nail) ×1 IMPLANT
PACK ORTHO EXTREMITY (CUSTOM PROCEDURE TRAY) ×2 IMPLANT
PAD ARMBOARD 7.5X6 YLW CONV (MISCELLANEOUS) ×3 IMPLANT
PAD CAST 4YDX4 CTTN HI CHSV (CAST SUPPLIES) ×1 IMPLANT
PADDING CAST COTTON 4X4 STRL (CAST SUPPLIES) ×2
PADDING CAST COTTON 6X4 STRL (CAST SUPPLIES) ×2 IMPLANT
SCREW ACECAP 40MM (Screw) ×1 IMPLANT
SCREW ACECAP 56MM (Screw) ×1 IMPLANT
SCREW PROXIMAL DEPUY (Screw) ×2 IMPLANT
SCREW PROXIMAL75MMLX5.5MM (Screw) ×1 IMPLANT
SCREW PRXML FT 65X5.5XNS CORT (Screw) IMPLANT
SPLINT PLASTER CAST XFAST 5X30 (CAST SUPPLIES) IMPLANT
SPLINT PLASTER XFAST SET 5X30 (CAST SUPPLIES) ×1
STAPLER VISISTAT 35W (STAPLE) ×2 IMPLANT
SUT ETHILON 2 0 FS 18 (SUTURE) ×2 IMPLANT
SUT ETHILON 3 0 PS 1 (SUTURE) ×3 IMPLANT
SUT VIC AB 0 CT1 27 (SUTURE) ×2
SUT VIC AB 0 CT1 27XBRD ANBCTR (SUTURE) IMPLANT
SUT VIC AB 2-0 CT1 27 (SUTURE) ×2
SUT VIC AB 2-0 CT1 TAPERPNT 27 (SUTURE) ×1 IMPLANT
TOWEL GREEN STERILE (TOWEL DISPOSABLE) ×4 IMPLANT
TOWEL GREEN STERILE FF (TOWEL DISPOSABLE) ×2 IMPLANT

## 2021-02-07 NOTE — Progress Notes (Signed)
Pt's wife requesting for PT to come when she is here. Wife states she will be here after 9am 02/08/21, if possible to arrange PT to come after that time. She states pt will also need his midodrine 30 minutes before PT.

## 2021-02-07 NOTE — Consult Note (Signed)
Orthopaedic Trauma Service Consultation  Reason for Consult: Left tibia and ankle fracture Referring Physician: Malena Catholic, MD  Miguel [name] is an 65 y.o. male.  HPI: [place] with complex injury to the left tibia and ankle in setting of hemiparesis. Given the location and complexity of the combined injuriies, upon further consideration Dr. Tamera Punt asserted that it would be in the best interest of the patient to have these injuries evaluated and treated by a fellowship trained orthopaedic traumatologist. Consequently, I was consulted to provide further evaluation and management. Pain is dull but severe with motion, no associated tingling or numbness, better with narcotics.   Past Medical History:  Diagnosis Date  . Cervical dystonia   . Depression   . Erectile dysfunction due to arterial insufficiency   . Functional dyspepsia   . Hypertension   . Pancreatitis 2005   likely secondary to alcohol use  . Rosacea     Past Surgical History:  Procedure Laterality Date  . no past surgery      Family History  Problem Relation Age of Onset  . Uterine cancer Mother   . Emphysema Father     Social History:  reports that he has quit smoking. He has never used smokeless tobacco. He reports current alcohol use. He reports that he does not use drugs.  Allergies:  Allergies  Allergen Reactions  . Aspirin Anaphylaxis    Childhood reaction  . Losartan Potassium     Other reaction(s): shakes/nervousness  . Sildenafil Citrate     Other reaction(s): severe headache    Medications:  Prior to Admission:  Medications Prior to Admission  Medication Sig Dispense Refill Last Dose  . acetaminophen (TYLENOL) 500 MG tablet Take 500 mg by mouth every 6 (six) hours as needed for moderate pain or headache.   Past Month at Unknown time  . atorvastatin (LIPITOR) 80 MG tablet Take 1 tablet by mouth Once a day (Patient taking differently: Take 80 mg by mouth daily.) 90 tablet 3 02/05/2021 at Unknown time   . buPROPion (WELLBUTRIN SR) 150 MG 12 hr tablet TAKE 1 TABLET BY MOUTH ONCE DAILY 90 (Patient taking differently: Take 150 mg by mouth daily.) 90 tablet 3 02/05/2021 at Unknown time  . calcium carbonate (TUMS - DOSED IN MG ELEMENTAL CALCIUM) 500 MG chewable tablet Chew 1 tablet (200 mg of elemental calcium total) by mouth as needed for indigestion or heartburn.   Past Month at Unknown time  . Cholecalciferol (VITAMIN D) 50 MCG (2000 UT) CAPS Take 1 capsule (2,000 Units total) by mouth daily. 30 capsule 0 02/05/2021 at Unknown time  . clonazePAM (KLONOPIN) 1 MG tablet TAKE 1 TABLET BY MOUTH ONCE A DAY (Patient taking differently: Take 1 mg by mouth daily.) 90 tablet 1 02/05/2021 at Unknown time  . clopidogrel (PLAVIX) 75 MG tablet Take 1 tablet by mouth Once a day (Patient taking differently: Take 75 mg by mouth daily.) 90 tablet 3 02/05/2021 at Unknown time  . Melatonin 1 MG CHEW Chew 0.5 mg by mouth at bedtime as needed (sleep).   Past Month at Unknown time  . midodrine (PROAMATINE) 10 MG tablet Take 1 tablet by mouth Three times a day 30 days (Patient taking differently: Take 10 mg by mouth 3 (three) times daily.) 90 tablet 2 02/05/2021 at Unknown time  . Multiple Vitamin (MULTIVITAMIN) capsule Take 1 capsule by mouth daily.   02/05/2021 at Unknown time  . venlafaxine XR (EFFEXOR-XR) 75 MG 24 hr capsule TAKE 1 CAPSULE BY  MOUTH ONCE A DAY (Patient taking differently: Take 75 mg by mouth daily.) 90 capsule 3 02/05/2021 at Unknown time  . baclofen (LIORESAL) 10 MG tablet Take 1 tablet (10 mg total) by mouth 3 (three) times daily as needed for muscle spasms. (Patient not taking: No sig reported) 30 each 0 Not Taking at Unknown time  . esomeprazole (NEXIUM) 40 MG capsule Take 1 capsule (40 mg total) by mouth as needed (heartburn). (Patient not taking: Reported on 02/05/2021) 30 capsule 0 Not Taking at Unknown time  . meclizine (ANTIVERT) 25 MG tablet Take 1 tablet (25 mg total) by mouth 2 (two) times daily as needed for  dizziness. (Patient not taking: No sig reported) 30 tablet 0 Not Taking at Unknown time  . midodrine (PROAMATINE) 5 MG tablet Take 1 tablet by mouth Three times a day 90 day(s) (Patient not taking: No sig reported) 270 tablet 1 Not Taking at Unknown time  . midodrine (PROAMATINE) 5 MG tablet Take 1 tablet by mouth 3 times a day (Patient not taking: No sig reported) 270 tablet 3 Not Taking at Unknown time  . polyethylene glycol (MIRALAX / GLYCOLAX) 17 g packet Take 17 g by mouth 2 (two) times daily. (Patient not taking: No sig reported) 14 each 0 Not Taking at Unknown time  . traMADol (ULTRAM) 50 MG tablet Take 1 tablet (50 mg total) by mouth every 8 (eight) hours as needed for severe pain (headache). (Patient not taking: No sig reported) 30 tablet 0 Not Taking at Unknown time    Results for orders placed or performed during the hospital encounter of 02/05/21 (from the past 48 hour(s))  CBC with Differential     Status: Abnormal   Collection Time: 02/05/21  7:15 PM  Result Value Ref Range   WBC 4.8 4.0 - 10.5 K/uL   RBC 3.71 (L) 4.22 - 5.81 MIL/uL   Hemoglobin 12.4 (L) 13.0 - 17.0 g/dL   HCT 36.2 (L) 39.0 - 52.0 %   MCV 97.6 80.0 - 100.0 fL   MCH 33.4 26.0 - 34.0 pg   MCHC 34.3 30.0 - 36.0 g/dL   RDW 11.6 11.5 - 15.5 %   Platelets 211 150 - 400 K/uL   nRBC 0.0 0.0 - 0.2 %   Neutrophils Relative % 54 %   Neutro Abs 2.6 1.7 - 7.7 K/uL   Lymphocytes Relative 33 %   Lymphs Abs 1.6 0.7 - 4.0 K/uL   Monocytes Relative 13 %   Monocytes Absolute 0.6 0.1 - 1.0 K/uL   Eosinophils Relative 0 %   Eosinophils Absolute 0.0 0.0 - 0.5 K/uL   Basophils Relative 0 %   Basophils Absolute 0.0 0.0 - 0.1 K/uL   Immature Granulocytes 0 %   Abs Immature Granulocytes 0.01 0.00 - 0.07 K/uL    Comment: Performed at [facility], [address], [place], [place] [address]  Basic metabolic panel     Status: Abnormal   Collection Time: 02/05/21  7:15 PM  Result Value Ref Range   Sodium 132 (L) 135 - 145  mmol/L   Potassium 3.5 3.5 - 5.1 mmol/L   Chloride 97 (L) 98 - 111 mmol/L   CO2 22 22 - 32 mmol/L   Glucose, Bld 108 (H) 70 - 99 mg/dL    Comment: Glucose reference range applies only to samples taken after fasting for at least 8 hours.   BUN 11 8 - 23 mg/dL   Creatinine, Ser 0.81 0.61 - 1.24  mg/dL   Calcium 8.7 (L) 8.9 - 10.3 mg/dL   GFR, Estimated >60 >60 mL/min    Comment: (NOTE) Calculated using the CKD-EPI Creatinine Equation (2021)    Anion gap 13 5 - 15    Comment: Performed at [facility], [place] [address], [place], [place] [address]  Protime-INR     Status: None   Collection Time: 02/05/21  7:15 PM  Result Value Ref Range   Prothrombin Time 14.3 11.4 - 15.2 seconds   INR 1.1 0.8 - 1.2    Comment: (NOTE) INR goal varies based on device and disease states. Performed at [facility], [place] [address], [place], [place] [address]   Type and screen [place]     Status: None   Collection Time: 02/05/21  7:15 PM  Result Value Ref Range   ABO/RH(D) O POS    Antibody Screen NEG    Sample Expiration      02/08/2021,2359 Performed at [facility], [place] [address] [place], [place] [address]   Resp Panel by RT-PCR (Flu A&B, Covid) Nasopharyngeal Swab     Status: None   Collection Time: 02/05/21  8:40 PM   Specimen: Nasopharyngeal Swab; Nasopharyngeal(NP) swabs in vial transport medium  Result Value Ref Range   SARS Coronavirus 2 by RT PCR NEGATIVE NEGATIVE    Comment: (NOTE) SARS-CoV-2 target nucleic acids are NOT DETECTED.  The SARS-CoV-2 RNA is generally detectable in upper respiratory specimens during the acute phase of infection. The lowest concentration of SARS-CoV-2 viral copies this assay can detect is 138 copies/mL. A negative result does not preclude SARS-Cov-2 infection and should not be used as the sole basis for treatment or other patient management decisions. A negative result may occur with  improper specimen  collection/handling, submission of specimen other than nasopharyngeal swab, presence of viral mutation(s) within the areas targeted by this assay, and inadequate number of viral copies(<138 copies/mL). A negative result must be combined with clinical observations, patient history, and epidemiological information. The expected result is Negative.  Fact Sheet for Patients:  EntrepreneurPulse.com.au  Fact Sheet for Healthcare Providers:  IncredibleEmployment.be  This test is no t yet approved or cleared by the [place] FDA and  has been authorized for detection and/or diagnosis of SARS-CoV-2 by FDA under an Emergency Use Authorization (EUA). This EUA will remain  in effect (meaning this test can be used) for the duration of the COVID-19 declaration under Section 564(b)(1) of the Act, 21 U.S.C.section 360bbb-3(b)(1), unless the authorization is terminated  or revoked sooner.       Influenza A by PCR NEGATIVE NEGATIVE   Influenza B by PCR NEGATIVE NEGATIVE    Comment: (NOTE) The Xpert Xpress SARS-CoV-2/FLU/RSV plus assay is intended as an aid in the diagnosis of influenza from Nasopharyngeal swab specimens and should not be used as a sole basis for treatment. Nasal washings and aspirates are unacceptable for Xpert Xpress SARS-CoV-2/FLU/RSV testing.  Fact Sheet for Patients: EntrepreneurPulse.com.au  Fact Sheet for Healthcare Providers: IncredibleEmployment.be  This test is not yet approved or cleared by the [place] FDA and has been authorized for detection and/or diagnosis of SARS-CoV-2 by FDA under an Emergency Use Authorization (EUA). This EUA will remain in effect (meaning this test can be used) for the duration of the COVID-19 declaration under Section 564(b)(1) of the Act, 21 U.S.C. section 360bbb-3(b)(1), unless the authorization is terminated or revoked.  Performed at [facility], [place]  [address], [place], [place] [address]   ABO/Rh     Status: None   Collection Time: 02/06/21  3:50 AM  Result Value Ref Range   ABO/RH(D)      O POS Performed at [facility] [address] [place], [place] [address]   Comprehensive metabolic panel     Status: Abnormal   Collection Time: 02/06/21  4:00 AM  Result Value Ref Range   Sodium 135 135 - 145 mmol/L   Potassium 3.5 3.5 - 5.1 mmol/L   Chloride 102 98 - 111 mmol/L   CO2 25 22 - 32 mmol/L   Glucose, Bld 117 (H) 70 - 99 mg/dL    Comment: Glucose reference range applies only to samples taken after fasting for at least 8 hours.   BUN 7 (L) 8 - 23 mg/dL   Creatinine, Ser 0.66 0.61 - 1.24 mg/dL   Calcium 8.7 (L) 8.9 - 10.3 mg/dL   Total Protein 5.7 (L) 6.5 - 8.1 g/dL   Albumin 3.5 3.5 - 5.0 g/dL   AST 20 15 - 41 U/L   ALT 30 0 - 44 U/L   Alkaline Phosphatase 69 38 - 126 U/L   Total Bilirubin 1.0 0.3 - 1.2 mg/dL   GFR, Estimated >60 >60 mL/min    Comment: (NOTE) Calculated using the CKD-EPI Creatinine Equation (2021)    Anion gap 8 5 - 15    Comment: Performed at [facility], [place] [address] [place], [place] [address]  CBC     Status: Abnormal   Collection Time: 02/06/21  4:00 AM  Result Value Ref Range   WBC 4.6 4.0 - 10.5 K/uL   RBC 3.48 (L) 4.22 - 5.81 MIL/uL   Hemoglobin 11.8 (L) 13.0 - 17.0 g/dL   HCT 33.7 (L) 39.0 - 52.0 %   MCV 96.8 80.0 - 100.0 fL   MCH 33.9 26.0 - 34.0 pg   MCHC 35.0 30.0 - 36.0 g/dL   RDW 11.8 11.5 - 15.5 %   Platelets 174 150 - 400 K/uL   nRBC 0.0 0.0 - 0.2 %    Comment: Performed at [facility], [place] [address], [place], [place] [address]  Surgical PCR screen     Status: None   Collection Time: 02/06/21  6:50 PM   Specimen: Nasal Mucosa; Nasal Swab  Result Value Ref Range   MRSA, PCR NEGATIVE NEGATIVE   Staphylococcus aureus NEGATIVE NEGATIVE    Comment: (NOTE) The Xpert SA Assay (FDA approved for NASAL specimens in patients 62 years of age and older), is  one component of a comprehensive surveillance program. It is not intended to diagnose infection nor to guide or monitor treatment. Performed at [facility], [place] [address], [place], [place] [address]   CBC     Status: Abnormal   Collection Time: 02/07/21  3:24 AM  Result Value Ref Range   WBC 5.1 4.0 - 10.5 K/uL   RBC 3.61 (L) 4.22 - 5.81 MIL/uL   Hemoglobin 12.1 (L) 13.0 - 17.0 g/dL   HCT 35.0 (L) 39.0 - 52.0 %   MCV 97.0 80.0 - 100.0 fL   MCH 33.5 26.0 - 34.0 pg   MCHC 34.6 30.0 - 36.0 g/dL   RDW 11.6 11.5 - 15.5 %   Platelets 164 150 - 400 K/uL   nRBC 0.0 0.0 - 0.2 %    Comment: Performed at [facility], [place] [address] [place], [place]  [address]  Basic metabolic panel     Status: Abnormal   Collection Time: 02/07/21  3:24 AM  Result Value Ref Range   Sodium 137 135 - 145 mmol/L   Potassium 4.3 3.5 - 5.1 mmol/L   Chloride 101 98 - 111 mmol/L   CO2 27 22 - 32 mmol/L   Glucose, Bld 144 (H) 70 - 99 mg/dL    Comment: Glucose reference range applies only to samples taken after fasting for at least 8 hours.   BUN 7 (L) 8 - 23 mg/dL   Creatinine, Ser 0.76 0.61 - 1.24 mg/dL   Calcium 9.0 8.9 - 10.3 mg/dL   GFR, Estimated >60 >60 mL/min    Comment: (NOTE) Calculated using the CKD-EPI Creatinine Equation (2021)    Anion gap 9 5 - 15    Comment: Performed at [facility] [address], [place], [place] [address]    DG Tibia/Fibula Left  Result Date: 02/05/2021 CLINICAL DATA:  Pain following fall EXAM: LEFT TIBIA AND FIBULA - 2 VIEW COMPARISON:  None. FINDINGS: Frontal and lateral views obtained. There is a spiral type fracture of the distal tibial diaphysis located approximately 0.5 cm proximal to the tibial plafond and with lateral displacement and slight posterior angulation of the distal fracture fragment with respect to the proximal fragment. There is a comminuted fracture of the distal fibular metaphysis with alignment near anatomic at this fracture site.  There is also a comminuted spiral type fracture of the proximal fibular diaphysis with alignment near anatomic. No dislocations. There is mild generalized knee joint space narrowing. No erosions. IMPRESSION: 1. Spiral fracture distal tibial diaphysis with lateral displacement and mild posterior angulation of the distal fracture fragment with respect proximal fragment. 2. Comminuted fractures of the proximal and distal aspects of the fibula with alignment at both fracture sites overall type. 3.  No dislocation. 4.  Somewhat generalized narrowing of knee joint. Electronically Signed   By: Lowella Grip III M.D.   On: 02/05/2021 19:57    ROS As above, but no recent fever, bleeding abnormalities, urologic dysfunction, GI problems, or weight gain.   Blood pressure (!) 168/86, pulse 87, temperature 98.3 F (36.8 C), temperature source Oral, resp. rate 18, height 6' (1.829 m), weight 88.5 kg, SpO2 93 %. Physical Exam NCAT LLE Dressing intact, clean, dry  Edema/ swelling controlled  Sens: DPN, SPN, TN intact  Motor: EHL, FHL, and lessor toe ext and flex all intact grossly  Brisk cap refill, warm to touch  Assessment/Plan: Left tibia shaft and ankle fracture Three beers/ day now Plavix held  I discussed with the patient the risks and benefits of surgery, including the possibility of infection, nerve injury, vessel injury, wound breakdown, arthritis, symptomatic hardware, DVT/ PE, loss of motion, malunion, nonunion, and need for further surgery among others.  We also specifically discussed the possibility of additional ankle fixation, some of which may need to be removed.  He acknowledged these risks and wished to proceed.  [place] , MD [facility], [facility] [phone]  02/07/2021  8:25 AM

## 2021-02-07 NOTE — Anesthesia Postprocedure Evaluation (Signed)
Anesthesia Post Note  Patient: Miguel [name]  Procedure(s) Performed: INTRAMEDULLARY (IM) NAIL TIBIAL (Left )     Patient location during evaluation: PACU Anesthesia Type: General Level of consciousness: awake and alert Pain management: pain level controlled Vital Signs Assessment: post-procedure vital signs reviewed and stable Respiratory status: spontaneous breathing, nonlabored ventilation, respiratory function stable and patient connected to nasal cannula oxygen Cardiovascular status: blood pressure returned to baseline and stable Postop Assessment: no apparent nausea or vomiting Anesthetic complications: no   No complications documented.  Last Vitals:  Vitals:   02/07/21 1705 02/07/21 2001  BP: 134/78 114/71  Pulse: 80 87  Resp:  19  Temp: 36.7 C 37.1 C  SpO2: 94% 94%    Last Pain:  Vitals:   02/07/21 1705  TempSrc: Oral  PainSc: 2                  March Rummage Gailya Tauer

## 2021-02-07 NOTE — Progress Notes (Signed)
ANTICOAGULATION CONSULT NOTE - Initial Consult  Pharmacy Consult for Post-op Plavix Indication: Recent CVA  Allergies  Allergen Reactions  . Aspirin Anaphylaxis    Childhood reaction  . Losartan Potassium     Other reaction(s): shakes/nervousness  . Sildenafil Citrate     Other reaction(s): severe headache    Patient Measurements: Height: 6' (182.9 cm) Weight: 88.5 kg (195 lb) IBW/kg (Calculated) : 77.6  Vital Signs: Temp: 97.6 F (36.4 C) (06/05 1211) Temp Source: Oral (06/05 1211) BP: 139/83 (06/05 1211) Pulse Rate: 69 (06/05 1211)  Labs: Recent Labs    02/05/21 1915 02/06/21 0400 02/07/21 0324  HGB 12.4* 11.8* 12.1*  HCT 36.2* 33.7* 35.0*  PLT 211 174 164  LABPROT 14.3  --   --   INR 1.1  --   --   CREATININE 0.81 0.66 0.76    Estimated Creatinine Clearance: 102.4 mL/min (by C-G formula based on SCr of 0.76 mg/dL).   Medical History: Past Medical History:  Diagnosis Date  . Cervical dystonia   . Depression   . Erectile dysfunction due to arterial insufficiency   . Functional dyspepsia   . Hypertension   . Pancreatitis 2005   likely secondary to alcohol use  . Rosacea     Assessment: Pharmacy consulted to resume patient's home Plavix tomorrow morning after surgery today. Patient admitted for tibial fracture 2/2 recent fall. Patient on Plavix prior to admission for recent CVA in March. Will restart tomorrow and monitor for any signs/symptoms of bleeding.   Goal of Therapy:  Monitor platelets by anticoagulation protocol: Yes   Plan:  Start Plavix 75mg  once daily tomorrow morning (POD1) Monitor CBC and for signs or symptoms of bleeding  Alfonse Spruce, PharmD PGY2 ID Pharmacy Resident Phone between 7 am - 3:30 pm: [phone]  Please check AMION for all [place] phone numbers After 10:00 PM, call [place] [phone]  02/07/2021,12:31 PM

## 2021-02-07 NOTE — Anesthesia Procedure Notes (Signed)
Procedure Name: Intubation Date/Time: 02/07/2021 8:43 AM Performed by: Janace Litten, CRNA Pre-anesthesia Checklist: Patient identified, Emergency Drugs available, Suction available and Patient being monitored Patient Re-evaluated:Patient Re-evaluated prior to induction Oxygen Delivery Method: Circle System Utilized Preoxygenation: Pre-oxygenation with 100% oxygen Induction Type: IV induction Ventilation: Mask ventilation without difficulty Laryngoscope Size: Mac and 4 Grade View: Grade I Tube type: Oral Tube size: 7.0 mm Number of attempts: 1 Airway Equipment and Method: Stylet Placement Confirmation: ETT inserted through vocal cords under direct vision,  positive ETCO2 and breath sounds checked- equal and bilateral Secured at: 22 cm Tube secured with: Tape Dental Injury: Teeth and Oropharynx as per pre-operative assessment

## 2021-02-07 NOTE — TOC CAGE-AID Note (Signed)
Transition of Care [facility]) - CAGE-AID Screening   Patient Details  Name: Miguel [name] MRN: [id number] Date of Birth: [birth year]   Elvina Sidle, RN  Trauma Response Nurse Phone Number: [phone] 02/07/2021, 5:48 PM      CAGE-AID Screening:    Have You Ever Felt You Ought to Cut Down on Your Drinking or Drug Use?: No Have People Annoyed You By Critizing Your Drinking Or Drug Use?: No Have You Felt Bad Or Guilty About Your Drinking Or Drug Use?: No Have You Ever Had a Drink or Used Drugs First Thing In The Morning to Steady Your Nerves or to Get Rid of a Hangover?: No CAGE-AID Score: 0  Substance Abuse Education Offered: No (Wife states that pt drink 1-3 drinks a day- but has not had any problems with any withdrawal. declines any information.)

## 2021-02-07 NOTE — Transfer of Care (Signed)
Immediate Anesthesia Transfer of Care Note  Patient: Miguel [name]  Procedure(s) Performed: INTRAMEDULLARY (IM) NAIL TIBIAL (Left )  Patient Location: PACU  Anesthesia Type:General  Level of Consciousness: drowsy, patient cooperative and responds to stimulation  Airway & Oxygen Therapy: Patient Spontanous Breathing  Post-op Assessment: Report given to RN and Post -op Vital signs reviewed and stable  Post vital signs: Reviewed and stable  Last Vitals:  Vitals Value Taken Time  BP    Temp    Pulse    Resp    SpO2      Last Pain:  Vitals:   02/07/21 0656  TempSrc: Oral  PainSc: 10-Worst pain ever      Patients Stated Pain Goal: 2 (93/81/01 7510)  Complications: No complications documented.

## 2021-02-07 NOTE — Progress Notes (Signed)
PROGRESS NOTE    Miguel [name]  OZH:[id number] DOB: 1956/06/06 DOA: 02/05/2021 PCP: Josetta Huddle, MD     Brief Narrative:  Miguel [name] is a 65 y.o. male with medical history significant of recent CVA about 2 months ago who is recuperating at home with mild right-sided hemiparesis, essential hypertension, depression, hyperlipidemia, previous alcohol use who is currently on Plavix following his CVA.  Patient has been doing great with physical therapy.  Apparently he was downgraded from using the walker to a cane.  He tried to use the cane and be independent.  Wife has told patient to wait for her surgical assistant to get up but before she could get back he tried to do it on his own.  His leg gave out underneath him twisted under him as he heard a crack when he sat on the left leg.  He was brought into the ER where he was found to have distal tibial fracture with some fractures of the fibula on the left side.  Orthopedic surgery consulted.  New events last 24 hours / Subjective: Patient underwent surgical fixation this morning. He reports feeling much better now, pain better, no nausea.   Assessment & Plan:   Principal Problem:   Fall Active Problems:   Acute CVA (cerebrovascular accident) ([facility])   Depression with anxiety   Essential hypertension   Alcohol use   Fracture tibia/fibula, left, closed, initial encounter   Left distal tib-fib fracture status post mechanical fall -Orthopedic surgery following -S/p surgical fixation 6/5  -Pain control, he will need PT OT postsurgery  History of recent CVA -Hold Plavix pending surgical intervention, resume as soon as cleared by surgical team -Continue Lipitor  Orthostatic hypotension -Continue midodrine  Depression, anxiety -Continue Effexor, Wellbutrin, klonopin    DVT prophylaxis:  SCDs Start: 02/06/21 0021  Code Status:     Code Status Orders  (From admission, onward)         Start     Ordered   02/06/21 0021  Full code   Continuous        02/06/21 0022        Code Status History    Date Active Date Inactive Code Status Order ID Comments User Context   02/05/2021 2212 02/06/2021 0022 Full Code [id number]  Elwyn Reach, MD ED   12/31/2020 1424 01/14/2021 1715 Full Code [id number]  Cathlyn Parsons, PA-C Inpatient   12/31/2020 1424 12/31/2020 1424 Full Code [id number]  Cathlyn Parsons, PA-C Inpatient   12/18/2020 1136 12/31/2020 1413 Full Code [id number]  Shelda Pal, DO ED   Advance Care Planning Activity     Family Communication: None at bedside Disposition Plan:  Status is: Inpatient  Remains inpatient appropriate because:Inpatient level of care appropriate due to severity of illness   Dispo: The patient is from: Home              Anticipated d/c is to: TBD              Patient currently is not medically stable to d/c.   Difficult to place patient No      Consultants:   Orthopedic surgery   Procedures:   IM tibial fixation 6/5  Antimicrobials:  Anti-infectives (From admission, onward)   Start     Dose/Rate Route Frequency Ordered Stop   02/06/21 1400  ceFAZolin (ANCEF) IVPB 2g/100 mL premix        2 g 200 mL/hr over 30 Minutes Intravenous On call to O.R. 02/06/21  6834 02/07/21 0559       Objective: Vitals:   02/07/21 0656 02/07/21 1120 02/07/21 1135 02/07/21 1145  BP: (!) 168/86 136/80 (!) 144/80 136/81  Pulse: 87 75 74 71  Resp:  14 (!) 9 13  Temp: 98.3 F (36.8 C) (!) 97.3 F (36.3 C)  (!) 97.5 F (36.4 C)  TempSrc: Oral     SpO2: 93% 94% 99% 97%  Weight: 88.5 kg     Height: 6' (1.829 m)       Intake/Output Summary (Last 24 hours) at 02/07/2021 1154 Last data filed at 02/07/2021 1055 Gross per 24 hour  Intake 2423.6 ml  Output 2700 ml  Net -276.4 ml   Filed Weights   02/07/21 0656  Weight: 88.5 kg    Examination: General exam: Appears calm and comfortable  Respiratory system: Clear to auscultation. Respiratory effort normal. Cardiovascular system:  S1 & S2 heard, RRR. No pedal edema. Gastrointestinal system: Abdomen is nondistended, soft and nontender. Normal bowel sounds heard. Central nervous system: Alert and oriented. Non focal exam. Speech clear  Extremities: LLE in cast  Skin: No rashes, lesions or ulcers on exposed skin  Psychiatry: Judgement and insight appear stable. Mood & affect appropriate.   Data Reviewed: I have personally reviewed following labs and imaging studies  CBC: Recent Labs  Lab 02/05/21 1915 02/06/21 0400 02/07/21 0324  WBC 4.8 4.6 5.1  NEUTROABS 2.6  --   --   HGB 12.4* 11.8* 12.1*  HCT 36.2* 33.7* 35.0*  MCV 97.6 96.8 97.0  PLT 211 174 196   Basic Metabolic Panel: Recent Labs  Lab 02/05/21 1915 02/06/21 0400 02/07/21 0324  NA 132* 135 137  K 3.5 3.5 4.3  CL 97* 102 101  CO2 22 25 27   GLUCOSE 108* 117* 144*  BUN 11 7* 7*  CREATININE 0.81 0.66 0.76  CALCIUM 8.7* 8.7* 9.0   GFR: Estimated Creatinine Clearance: 102.4 mL/min (by C-G formula based on SCr of 0.76 mg/dL). Liver Function Tests: Recent Labs  Lab 02/06/21 0400  AST 20  ALT 30  ALKPHOS 69  BILITOT 1.0  PROT 5.7*  ALBUMIN 3.5   No results for input(s): LIPASE, AMYLASE in the last 168 hours. No results for input(s): AMMONIA in the last 168 hours. Coagulation Profile: Recent Labs  Lab 02/05/21 1915  INR 1.1   Cardiac Enzymes: No results for input(s): CKTOTAL, CKMB, CKMBINDEX, TROPONINI in the last 168 hours. BNP (last 3 results) No results for input(s): PROBNP in the last 8760 hours. HbA1C: No results for input(s): HGBA1C in the last 72 hours. CBG: No results for input(s): GLUCAP in the last 168 hours. Lipid Profile: No results for input(s): CHOL, HDL, LDLCALC, TRIG, CHOLHDL, LDLDIRECT in the last 72 hours. Thyroid Function Tests: No results for input(s): TSH, T4TOTAL, FREET4, T3FREE, THYROIDAB in the last 72 hours. Anemia Panel: No results for input(s): VITAMINB12, FOLATE, FERRITIN, TIBC, IRON, RETICCTPCT in  the last 72 hours. Sepsis Labs: No results for input(s): PROCALCITON, LATICACIDVEN in the last 168 hours.  Recent Results (from the past 240 hour(s))  Resp Panel by RT-PCR (Flu A&B, Covid) Nasopharyngeal Swab     Status: None   Collection Time: 02/05/21  8:40 PM   Specimen: Nasopharyngeal Swab; Nasopharyngeal(NP) swabs in vial transport medium  Result Value Ref Range Status   SARS Coronavirus 2 by RT PCR NEGATIVE NEGATIVE Final    Comment: (NOTE) SARS-CoV-2 target nucleic acids are NOT DETECTED.  The SARS-CoV-2 RNA is generally detectable in  upper respiratory specimens during the acute phase of infection. The lowest concentration of SARS-CoV-2 viral copies this assay can detect is 138 copies/mL. A negative result does not preclude SARS-Cov-2 infection and should not be used as the sole basis for treatment or other patient management decisions. A negative result may occur with  improper specimen collection/handling, submission of specimen other than nasopharyngeal swab, presence of viral mutation(s) within the areas targeted by this assay, and inadequate number of viral copies(<138 copies/mL). A negative result must be combined with clinical observations, patient history, and epidemiological information. The expected result is Negative.  Fact Sheet for Patients:  EntrepreneurPulse.com.au  Fact Sheet for Healthcare Providers:  IncredibleEmployment.be  This test is no t yet approved or cleared by the [place] FDA and  has been authorized for detection and/or diagnosis of SARS-CoV-2 by FDA under an Emergency Use Authorization (EUA). This EUA will remain  in effect (meaning this test can be used) for the duration of the COVID-19 declaration under Section 564(b)(1) of the Act, 21 U.S.C.section 360bbb-3(b)(1), unless the authorization is terminated  or revoked sooner.       Influenza A by PCR NEGATIVE NEGATIVE Final   Influenza B by PCR  NEGATIVE NEGATIVE Final    Comment: (NOTE) The Xpert Xpress SARS-CoV-2/FLU/RSV plus assay is intended as an aid in the diagnosis of influenza from Nasopharyngeal swab specimens and should not be used as a sole basis for treatment. Nasal washings and aspirates are unacceptable for Xpert Xpress SARS-CoV-2/FLU/RSV testing.  Fact Sheet for Patients: EntrepreneurPulse.com.au  Fact Sheet for Healthcare Providers: IncredibleEmployment.be  This test is not yet approved or cleared by the [place] FDA and has been authorized for detection and/or diagnosis of SARS-CoV-2 by FDA under an Emergency Use Authorization (EUA). This EUA will remain in effect (meaning this test can be used) for the duration of the COVID-19 declaration under Section 564(b)(1) of the Act, 21 U.S.C. section 360bbb-3(b)(1), unless the authorization is terminated or revoked.  Performed at [facility], [place] [address], [place], [place] [address]   Surgical PCR screen     Status: None   Collection Time: 02/06/21  6:50 PM   Specimen: Nasal Mucosa; Nasal Swab  Result Value Ref Range Status   MRSA, PCR NEGATIVE NEGATIVE Final   Staphylococcus aureus NEGATIVE NEGATIVE Final    Comment: (NOTE) The Xpert SA Assay (FDA approved for NASAL specimens in patients 37 years of age and older), is one component of a comprehensive surveillance program. It is not intended to diagnose infection nor to guide or monitor treatment. Performed at [facility], [place] [address], [place], [place] [address]       Radiology Studies: DG Tibia/Fibula Left  Result Date: 02/05/2021 CLINICAL DATA:  Pain following fall EXAM: LEFT TIBIA AND FIBULA - 2 VIEW COMPARISON:  None. FINDINGS: Frontal and lateral views obtained. There is a spiral type fracture of the distal tibial diaphysis located approximately 0.5 cm proximal to the tibial plafond and with lateral displacement and slight posterior  angulation of the distal fracture fragment with respect to the proximal fragment. There is a comminuted fracture of the distal fibular metaphysis with alignment near anatomic at this fracture site. There is also a comminuted spiral type fracture of the proximal fibular diaphysis with alignment near anatomic. No dislocations. There is mild generalized knee joint space narrowing. No erosions. IMPRESSION: 1. Spiral fracture distal tibial diaphysis with lateral displacement and mild posterior angulation of the distal fracture fragment with  respect proximal fragment. 2. Comminuted fractures of the proximal and distal aspects of the fibula with alignment at both fracture sites overall type. 3.  No dislocation. 4.  Somewhat generalized narrowing of knee joint. Electronically Signed   By: Lowella Grip III M.D.   On: 02/05/2021 19:57      Scheduled Meds: . [MAR Hold] atorvastatin  80 mg Oral Daily  . [MAR Hold] buPROPion  150 mg Oral Daily  . [MAR Hold] cholecalciferol  2,000 Units Oral Daily  . [MAR Hold] clonazePAM  1 mg Oral Daily  . [MAR Hold] midodrine  10 mg Oral TID WC  . [MAR Hold] multivitamin with minerals  1 tablet Oral Daily  . [MAR Hold] mupirocin ointment  1 application Nasal BID  . [MAR Hold] pantoprazole  40 mg Oral Daily  . [MAR Hold] venlafaxine XR  75 mg Oral Q breakfast   Continuous Infusions:    LOS: 2 days      Time spent: 20 minutes   Dessa Phi, DO [facility] 02/07/2021, 11:54 AM   Available via Epic secure chat 7am-7pm After these hours, please refer to coverage provider listed on [email or web address]

## 2021-02-07 NOTE — Anesthesia Preprocedure Evaluation (Signed)
Anesthesia Evaluation  Patient identified by MRN, date of birth, ID band Patient awake    Reviewed: Allergy & Precautions, NPO status , Patient's Chart, lab work & pertinent test results  Airway Mallampati: II  TM Distance: >3 FB Neck ROM: Full    Dental  (+) Upper Dentures, Lower Dentures   Pulmonary neg pulmonary ROS, former smoker,    Pulmonary exam normal        Cardiovascular hypertension,  Rhythm:Regular Rate:Normal     Neuro/Psych Anxiety Depression CVA, Residual Symptoms    GI/Hepatic Neg liver ROS, GERD  Medicated,Pancreatitis    Endo/Other  negative endocrine ROS  Renal/GU negative Renal ROS  negative genitourinary   Musculoskeletal Left femur fx 2/2 fall   Abdominal (+)  Abdomen: soft. Bowel sounds: normal.  Peds  Hematology negative hematology ROS (+)   Anesthesia Other Findings   Reproductive/Obstetrics                            Anesthesia Physical Anesthesia Plan  ASA: III  Anesthesia Plan: General   Post-op Pain Management:    Induction: Intravenous  PONV Risk Score and Plan: 2 and Ondansetron, Dexamethasone, Midazolam and Treatment may vary due to age or medical condition  Airway Management Planned: Mask and Oral ETT  Additional Equipment: None  Intra-op Plan:   Post-operative Plan: Extubation in OR  Informed Consent: I have reviewed the patients History and Physical, chart, labs and discussed the procedure including the risks, benefits and alternatives for the proposed anesthesia with the patient or authorized representative who has indicated his/her understanding and acceptance.     Dental advisory given  Plan Discussed with: CRNA  Anesthesia Plan Comments: (Lab Results      Component                Value               Date                      WBC                      5.1                 02/07/2021                HGB                      12.1 (L)             02/07/2021                HCT                      35.0 (L)            02/07/2021                MCV                      97.0                02/07/2021                PLT                      164  02/07/2021           Lab Results      Component                Value               Date                      NA                       137                 02/07/2021                K                        4.3                 02/07/2021                CO2                      27                  02/07/2021                GLUCOSE                  144 (H)             02/07/2021                BUN                      7 (L)               02/07/2021                CREATININE               0.76                02/07/2021                CALCIUM                  9.0                 02/07/2021                GFRNONAA                 >60                 02/07/2021          )        Anesthesia Quick Evaluation

## 2021-02-08 ENCOUNTER — Ambulatory Visit: Payer: BC Managed Care – PPO | Admitting: Occupational Therapy

## 2021-02-08 ENCOUNTER — Other Ambulatory Visit (HOSPITAL_COMMUNITY): Payer: Self-pay

## 2021-02-08 ENCOUNTER — Ambulatory Visit: Payer: BC Managed Care – PPO | Admitting: Physical Therapy

## 2021-02-08 ENCOUNTER — Inpatient Hospital Stay (HOSPITAL_COMMUNITY): Payer: BC Managed Care – PPO

## 2021-02-08 ENCOUNTER — Encounter (HOSPITAL_COMMUNITY): Payer: Self-pay | Admitting: Orthopedic Surgery

## 2021-02-08 LAB — COMPREHENSIVE METABOLIC PANEL
ALT: 20 U/L (ref 0–44)
AST: 15 U/L (ref 15–41)
Albumin: 3 g/dL — ABNORMAL LOW (ref 3.5–5.0)
Alkaline Phosphatase: 60 U/L (ref 38–126)
Anion gap: 6 (ref 5–15)
BUN: 9 mg/dL (ref 8–23)
CO2: 29 mmol/L (ref 22–32)
Calcium: 9 mg/dL (ref 8.9–10.3)
Chloride: 102 mmol/L (ref 98–111)
Creatinine, Ser: 0.9 mg/dL (ref 0.61–1.24)
GFR, Estimated: >60 mL/min (ref >60)
Glucose, Bld: 189 mg/dL — ABNORMAL HIGH (ref 70–99)
Potassium: 4.2 mmol/L (ref 3.5–5.1)
Sodium: 137 mmol/L (ref 135–145)
Total Bilirubin: 0.4 mg/dL (ref 0.3–1.2)
Total Protein: 5.6 g/dL — ABNORMAL LOW (ref 6.5–8.1)

## 2021-02-08 LAB — CBC
HCT: 30.1 % — ABNORMAL LOW (ref 39.0–52.0)
Hemoglobin: 10.2 g/dL — ABNORMAL LOW (ref 13.0–17.0)
MCH: 33.4 pg (ref 26.0–34.0)
MCHC: 33.9 g/dL (ref 30.0–36.0)
MCV: 98.7 fL (ref 80.0–100.0)
Platelets: 187 10*3/uL (ref 150–400)
RBC: 3.05 MIL/uL — ABNORMAL LOW (ref 4.22–5.81)
RDW: 11.5 % (ref 11.5–15.5)
WBC: 5.8 10*3/uL (ref 4.0–10.5)
nRBC: 0 % (ref 0.0–0.2)

## 2021-02-08 LAB — VITAMIN D 25 HYDROXY (VIT D DEFICIENCY, FRACTURES): Vit D, 25-Hydroxy: 41.67 ng/mL (ref 30–100)

## 2021-02-08 NOTE — Evaluation (Signed)
Physical Therapy Evaluation Patient Details Name: Miguel [name] MRN: [id number] DOB: 28-Feb-1956 Today's Date: 02/08/2021   History of Present Illness  Miguel [name] is a 65 y.o. male s/p mechanical fall sustaining L proximal and distal fibula fx, L LE NWB. PMH: recent CVA about 2 months ago, mild Left-sided hemiparesis, essential hypertension, depression, hyperlipidemia,  previous alcohol use who is currently on Plavix following his CVA    Clinical Impression  Pt admitted with above. Pt unsafe to attempt hopping on R LE only as primary mode mobility as pt with L lateral lean and L UE weakness limiting activity tolerance and ability to dependent on UEs for ambulation. Pt was able to safely complete lateral scoot transfer to drop arm chair with minA only for L LE management. Discussed at length with pt and wife regarding home situation/set up. Unfortunately there isn't enough room for pt to navigate the hallways with a w/c to access 1/2 bath. Pt will need a drop arm bed side commode for toleiting purposes. Pt's wife is working on getting a ramp to allow for safe entry into the house as wife unable to bump pt up in a w/c or hop up them as there are not hand rails. Pt to benefit from HHPT to strengthening bilat UEs and R LE to progress safe mobility with L LE NWB. Acute PT to cont to follow.   Follow Up Recommendations Home health PT;Supervision/Assistance - 24 hour    Equipment Recommendations  3in1 (PT);Wheelchair (measurements PT);Wheelchair cushion (measurements PT) (drop arm BSC, pt is 6' tall and will need appropriate w/c with appropriate length elevating leg rests)    Recommendations for Other Services       Precautions / Restrictions Precautions Precautions: Fall Precaution Comments: pt leans to the L, residual from recent CVA Restrictions Weight Bearing Restrictions: Yes LLE Weight Bearing: Non weight bearing      Mobility  Bed Mobility Overal bed mobility: Needs Assistance Bed  Mobility: Supine to Sit     Supine to sit: Min assist     General bed mobility comments: minA for R LE management, HOB elevated, increased time    Transfers Overall transfer level: Needs assistance Equipment used: Rolling walker (2 wheeled) Transfers: Sit to/from Stand;Lateral/Scoot Transfers Sit to Stand: Min assist;Mod assist;From elevated surface        Lateral/Scoot Transfers: Min assist General transfer comment: pt completed sit to stand with light modA to power up and transition hands to RW, assist to maintain L LE NWB, pt with L lateral lean (residual from recent CVA) and shakiness. Pt did not feel comfortable trying to hop on the R foot only to complete std pvt transfer to recliner. Pt was able to complete lateral scoot transfer and maintain L LE NWB with min A only to hold L LE. Pt with good strength in bilat UEs and was able to clear bottom in safe lateral scoot to drop arm recliner.  Ambulation/Gait             General Gait Details: unable at this time  Stairs            Wheelchair Mobility    Modified Rankin (Stroke Patients Only)       Balance Overall balance assessment: Needs assistance Sitting-balance support: Feet supported;No upper extremity supported Sitting balance-Leahy Scale: Fair Sitting balance - Comments: mild L lateral lean, residual from previous stroke   Standing balance support: Bilateral upper extremity supported;During functional activity Standing balance-Leahy Scale: Poor Standing balance comment: dependent on  UEs                             Pertinent Vitals/Pain Pain Assessment: Faces Faces Pain Scale: Hurts even more Pain Location: L lower leg Pain Descriptors / Indicators: Sore Pain Intervention(s): Premedicated before session    Home Living Family/patient expects to be discharged to:: Private residence Living Arrangements: Spouse/significant other   Type of Home: House Home Access: Stairs to  enter Entrance Stairs-Rails:  (handicap bar) Entrance Stairs-Number of Steps: 2 (with handicap bar to hold onto) Home Layout: 1/2 bath on main level;Two level;Able to live on main level with bedroom/bathroom Home Equipment: Kasandra Knudsen - single point;Walker - 2 wheels;Tub bench;Transport chair      Prior Function Level of Independence: Needs assistance   Gait / Transfers Assistance Needed: pt was using cane PTA, working with neuro outpt therapy, just transitioned from RW to cane  ADL's / Homemaking Assistance Needed: wife with set up for sponge bath        Hand Dominance   Dominant Hand: Right    Extremity/Trunk Assessment   Upper Extremity Assessment Upper Extremity Assessment: LUE deficits/detail LUE Deficits / Details: residual weakness from recent stroke 12/2020    Lower Extremity Assessment Lower Extremity Assessment: LLE deficits/detail LLE Deficits / Details: ankle in splint, knee and hip full ROM    Cervical / Trunk Assessment Cervical / Trunk Assessment: Normal  Communication   Communication: No difficulties  Cognition Arousal/Alertness: Awake/alert Behavior During Therapy: WFL for tasks assessed/performed Overall Cognitive Status: Within Functional Limits for tasks assessed                                        General Comments General comments (skin integrity, edema, etc.): L LE with noted swelling, VSS    Exercises General Exercises - Lower Extremity Quad Sets: AROM;Left;10 reps;Supine Long Arc Quad: AAROM;Left;10 reps;Seated   Assessment/Plan    PT Assessment Patient needs continued PT services  PT Problem List Decreased strength;Decreased range of motion;Decreased activity tolerance;Decreased balance;Decreased mobility;Decreased coordination;Decreased cognition;Decreased knowledge of use of DME;Decreased safety awareness       PT Treatment Interventions DME instruction;Gait training;Stair training;Functional mobility training;Therapeutic  activities;Therapeutic exercise;Balance training;Neuromuscular re-education;Wheelchair mobility training    PT Goals (Current goals can be found in the Care Plan section)  Acute Rehab PT Goals Patient Stated Goal: home PT Goal Formulation: With patient/family Time For Goal Achievement: 02/22/21 Potential to Achieve Goals: Good    Frequency Min 4X/week   Barriers to discharge Inaccessible home environment pt's wife working on getting a ramp    Co-evaluation               AM-PAC PT "6 Clicks" Mobility  Outcome Measure Help needed turning from your back to your side while in a flat bed without using bedrails?: A Little Help needed moving from lying on your back to sitting on the side of a flat bed without using bedrails?: A Little Help needed moving to and from a bed to a chair (including a wheelchair)?: A Little Help needed standing up from a chair using your arms (e.g., wheelchair or bedside chair)?: A Little Help needed to walk in hospital room?: Total Help needed climbing 3-5 steps with a railing? : Total 6 Click Score: 14    End of Session Equipment Utilized During Treatment: Gait belt Activity Tolerance: Patient  tolerated treatment well Patient left: in chair;with call bell/phone within reach;with chair alarm set;with family/visitor present Nurse Communication: Mobility status PT Visit Diagnosis: Muscle weakness (generalized) (M62.81);Difficulty in walking, not elsewhere classified (R26.2)    Time: 0930-1020 PT Time Calculation (min) (ACUTE ONLY): 50 min   Charges:   PT Evaluation $PT Eval Moderate Complexity: 1 Mod PT Treatments $Therapeutic Exercise: 8-22 mins $Therapeutic Activity: 8-22 mins       Kittie Plater, PT, DPT Acute Rehabilitation Services Pager #: [phone] Office #: [phone]   Berline Lopes 02/08/2021, 11:26 AM

## 2021-02-08 NOTE — Progress Notes (Signed)
Occupational Therapy Evaluation Patient Details Name: Miguel [name] MRN: [id number] DOB: 1956/07/30 Today's Date: 02/08/2021    History of Present Illness Miguel [name] is a 65 y.o. male s/p mechanical fall sustaining L proximal and distal fibula fx, L LE NWB. PMH: recent CVA about 2 months ago, mild Left-sided hemiparesis, essential hypertension, depression, hyperlipidemia,  previous alcohol use who is currently on Plavix following his CVA   Clinical Impression   Miguel [name] was evaluated s/p the above L fibula fx. PTA pt was set up for most ADLs, supervision for all functional mobility and ambulated with a cane. Upon evaluation pt continues to be set up for upper body ADLs, however requires mod A for lower body ADLs utilizing sitting and lateral leans to maintain safety and WB status. Spent time educating on drop arm BSC and functional transfers via lateral scoot, pt and pt's wife verbalized understanding.  Pt presents with residual L sided weakness from previous stroke. Pt benefits from continued OT acutely to progress function in ADLs and transfers. Recommend d/c home with supervision and assistance for all surface transfers and the below equipment for safe home set up.     Follow Up Recommendations  Home health OT;Supervision/Assistance - 24 hour    Equipment Recommendations   (drop arm BSC, wc with removable arm rests & leg support)       Precautions / Restrictions Precautions Precautions: Fall Precaution Comments: pt leans to the L, residual from recent CVA Restrictions Weight Bearing Restrictions: Yes LLE Weight Bearing: Non weight bearing      Mobility Bed Mobility Overal bed mobility: Needs Assistance             General bed mobility comments: pt in chari upon arrival    Transfers Overall transfer level: Needs assistance Equipment used: Rolling walker (2 wheeled) Transfers: Sit to/from Stand;Lateral/Scoot Transfers Sit to Stand: Min assist;Mod assist;From elevated surface         Lateral/Scoot Transfers: Min assist      Balance Overall balance assessment: Needs assistance Sitting-balance support: Feet supported;No upper extremity supported Sitting balance-Leahy Scale: Fair        ADL either performed or assessed with clinical judgement   ADL Overall ADL's : Needs assistance/impaired Eating/Feeding: Independent;Sitting   Grooming: Wash/dry hands;Wash/dry face;Oral care;Applying deodorant;Set up;Sitting   Upper Body Bathing: Set up;Sitting   Lower Body Bathing: Minimal assistance;Sitting/lateral leans   Upper Body Dressing : Set up;Sitting   Lower Body Dressing: Sitting/lateral leans;Moderate assistance   Toilet Transfer: Moderate assistance;Cueing for safety;Cueing for sequencing;BSC;Requires drop arm (lateral scoot transfer)   Toileting- Clothing Manipulation and Hygiene: Minimal assistance;Cueing for safety;Cueing for sequencing;Sitting/lateral lean       Functional mobility during ADLs: Moderate assistance (lateral surface scoots, can manage a wc indep) General ADL Comments: pt does well with lateral leans and scoots for ADLs and functional transfers to maintain LLE NWB and safety                  Pertinent Vitals/Pain Pain Assessment: Faces Faces Pain Scale: Hurts little more Pain Location: L lower leg Pain Descriptors / Indicators: Sore     Hand Dominance Right   Extremity/Trunk Assessment Upper Extremity Assessment Upper Extremity Assessment: LUE deficits/detail LUE Deficits / Details: residual weakness from recent stroke 12/2020 LUE Sensation: WNL LUE Coordination: decreased fine motor   Lower Extremity Assessment Lower Extremity Assessment: Defer to PT evaluation   Cervical / Trunk Assessment Cervical / Trunk Assessment: Normal   Communication Communication Communication: No difficulties  Cognition Arousal/Alertness: Awake/alert Behavior During Therapy: WFL for tasks assessed/performed Overall Cognitive Status:  Within Functional Limits for tasks assessed            General Comments  no new concerns noted, residual weakness on L side, notes updated      Home Living Family/patient expects to be discharged to:: Private residence Living Arrangements: Spouse/significant other Available Help at Discharge: Family Type of Home: House Home Access: Stairs to enter [employer] of Steps:  (with handicap bar to hole onto) Entrance Stairs-Rails: None Home Layout: 1/2 bath on main level;Two level;Able to live on main level with bedroom/bathroom Alternate Level Stairs-Number of Steps: 7 steps, landing, 7 steps Alternate Level Stairs-Rails: Right Bathroom Shower/Tub: Occupational psychologist: Standard Bathroom Accessibility: Yes   Home Equipment: Cane - single point;Walker - 2 wheels;Tub bench;Transport chair          Prior Functioning/Environment Level of Independence: Needs assistance  Gait / Transfers Assistance Needed: pt was using cane PTA, working with neuro outpt therapy, just transitioned from RW to cane ADL's / Homemaking Assistance Needed: Pt was providing assist to get in/out of tub shower with use of tub transfer bench; pt bathed with set up            OT Problem List: Decreased strength;Decreased range of motion;Decreased activity tolerance;Decreased knowledge of use of DME or AE;Decreased safety awareness;Decreased knowledge of precautions;Pain      OT Treatment/Interventions: Self-care/ADL training;Therapeutic exercise;DME and/or AE instruction;Patient/family education;Balance training    OT Goals(Current goals can be found in the care plan section) Acute Rehab OT Goals Patient Stated Goal: home OT Goal Formulation: With patient Time For Goal Achievement: 02/22/21 Potential to Achieve Goals: Fair ADL Goals Pt Will Perform Upper Body Bathing: with set-up;sitting Pt Will Perform Lower Body Bathing: with min guard assist;sitting/lateral leans Pt Will  Perform Lower Body Dressing: with min guard assist;sitting/lateral leans Pt Will Transfer to Toilet: with min guard assist;bedside commode  OT Frequency: Min 2X/week    AM-PAC OT "6 Clicks" Daily Activity     Outcome Measure Help from another person eating meals?: None Help from another person taking care of personal grooming?: A Little Help from another person toileting, which includes using toliet, bedpan, or urinal?: A Lot Help from another person bathing (including washing, rinsing, drying)?: A Little Help from another person to put on and taking off regular upper body clothing?: A Little Help from another person to put on and taking off regular lower body clothing?: A Little 6 Click Score: 18   End of Session Nurse Communication: Mobility status;Other (comment)  Activity Tolerance: Patient tolerated treatment well Patient left: in chair;with call bell/phone within reach;with family/visitor present  OT Visit Diagnosis: Other abnormalities of gait and mobility (R26.89);Repeated falls (R29.6);Muscle weakness (generalized) (M62.81);Pain Pain - Right/Left: Left Pain - part of body: Hip;Leg                Time: 5053-9767 OT Time Calculation (min): 22 min Charges:  OT General Charges $OT Visit: 1 Visit OT Evaluation $OT Eval Moderate Complexity: 1 Mod   Miguel [name] A Viyaan Champine 02/08/2021, 2:12 PM

## 2021-02-08 NOTE — Plan of Care (Signed)
  Problem: Activity: Goal: Ability to increase mobility will improve Outcome: Progressing   Problem: Health Behavior/Discharge Planning: Goal: Ability to manage health-related needs will improve Outcome: Progressing   Problem: Clinical Measurements: Goal: Ability to maintain clinical measurements within normal limits will improve Outcome: Progressing   Problem: Activity: Goal: Risk for activity intolerance will decrease Outcome: Progressing   Problem: Nutrition: Goal: Adequate nutrition will be maintained Outcome: Progressing   Problem: Pain Managment: Goal: General experience of comfort will improve Outcome: Progressing

## 2021-02-08 NOTE — Progress Notes (Signed)
Bedside shift report complete. Received patient awake,alert/orientedx4 and able to verbalize needs. NAD noted; respirations easy/even on room air. Dressing to LLE c/d/i; sensation intact and able to wiggle toes. Movement to all other extremities present. SCD on to RLE. Whiteboard updated. All safety measures in place and personal belongings within reach.

## 2021-02-08 NOTE — Plan of Care (Signed)
  Problem: Activity: Goal: Ability to increase mobility will improve Outcome: Progressing   Problem: Physical Regulation: Goal: Postoperative complications will be avoided or minimized Outcome: Progressing   Problem: Pain Management: Goal: Pain level will decrease with appropriate interventions Outcome: Progressing   Problem: Skin Integrity: Goal: Will show signs of wound healing Outcome: Progressing   Problem: Health Behavior/Discharge Planning: Goal: Ability to manage health-related needs will improve Outcome: Progressing   Problem: Clinical Measurements: Goal: Ability to maintain clinical measurements within normal limits will improve Outcome: Progressing Goal: Will remain free from infection Outcome: Progressing Goal: Diagnostic test results will improve Outcome: Progressing Goal: Respiratory complications will improve Outcome: Progressing Goal: Cardiovascular complication will be avoided Outcome: Progressing   Problem: Activity: Goal: Risk for activity intolerance will decrease Outcome: Progressing   Problem: Nutrition: Goal: Adequate nutrition will be maintained Outcome: Progressing   Problem: Coping: Goal: Level of anxiety will decrease Outcome: Progressing   Problem: Elimination: Goal: Will not experience complications related to bowel motility Outcome: Progressing Goal: Will not experience complications related to urinary retention Outcome: Progressing   Problem: Pain Managment: Goal: General experience of comfort will improve Outcome: Progressing   Problem: Safety: Goal: Ability to remain free from injury will improve Outcome: Progressing   Problem: Skin Integrity: Goal: Risk for impaired skin integrity will decrease Outcome: Progressing

## 2021-02-08 NOTE — Progress Notes (Signed)
Orthopaedic Trauma Service Progress Note  Patient ID: Miguel [name] MRN: [id number] DOB/AGE: [birth year] 65 y.o.  Subjective:  Doing well Pain controlled  L sided hemiparesis/weakness due to stroke   No specific complaints   Vitamin d levels look good  ROS As above  Objective:   VITALS:   Vitals:   02/07/21 2001 02/08/21 0028 02/08/21 0553 02/08/21 0839  BP: 114/71 122/71 131/89 131/71  Pulse: 87 74 74 80  Resp: 19 19 19 18   Temp: 98.8 F (37.1 C) 98.4 F (36.9 C) 98.2 F (36.8 C) 98 F (36.7 C)  TempSrc:  Oral Oral Oral  SpO2: 94% 94% 96% 97%  Weight:      Height:        Estimated body mass index is 26.45 kg/m as calculated from the following:   Height as of this encounter: 6' (1.829 m).   Weight as of this encounter: 88.5 kg.   Intake/Output      06/05 0701 06/06 0700 06/06 0701 06/07 0700   P.O. 360    I.V. (mL/kg) 2116.4 (23.9)    IV Piggyback 352.5    Total Intake(mL/kg) 2828.9 (32)    Urine (mL/kg/hr) 1825 (0.9) 725 (2.7)   Blood 50    Total Output 1875 725   Net +953.9 -725          LABS  Results for orders placed or performed during the hospital encounter of 02/05/21 (from the past 24 hour(s))  CBC     Status: Abnormal   Collection Time: 02/08/21  2:56 AM  Result Value Ref Range   WBC 5.8 4.0 - 10.5 K/uL   RBC 3.05 (L) 4.22 - 5.81 MIL/uL   Hemoglobin 10.2 (L) 13.0 - 17.0 g/dL   HCT 30.1 (L) 39.0 - 52.0 %   MCV 98.7 80.0 - 100.0 fL   MCH 33.4 26.0 - 34.0 pg   MCHC 33.9 30.0 - 36.0 g/dL   RDW 11.5 11.5 - 15.5 %   Platelets 187 150 - 400 K/uL   nRBC 0.0 0.0 - 0.2 %  Comprehensive metabolic panel     Status: Abnormal   Collection Time: 02/08/21  2:56 AM  Result Value Ref Range   Sodium 137 135 - 145 mmol/L   Potassium 4.2 3.5 - 5.1 mmol/L   Chloride 102 98 - 111 mmol/L   CO2 29 22 - 32 mmol/L   Glucose, Bld 189 (H) 70 - 99 mg/dL   BUN 9 8 - 23 mg/dL    Creatinine, Ser 0.90 0.61 - 1.24 mg/dL   Calcium 9.0 8.9 - 10.3 mg/dL   Total Protein 5.6 (L) 6.5 - 8.1 g/dL   Albumin 3.0 (L) 3.5 - 5.0 g/dL   AST 15 15 - 41 U/L   ALT 20 0 - 44 U/L   Alkaline Phosphatase 60 38 - 126 U/L   Total Bilirubin 0.4 0.3 - 1.2 mg/dL   GFR, Estimated >60 >60 mL/min   Anion gap 6 5 - 15  VITAMIN D 25 Hydroxy (Vit-D Deficiency, Fractures)     Status: None   Collection Time: 02/08/21  2:56 AM  Result Value Ref Range   Vit D, 25-Hydroxy 41.67 30 - 100 ng/mL     PHYSICAL EXAM:   Gen: awake, sitting up in bed, about to work with  PT  Lungs: unlabored Cardiac: regular Ext:       Left Lower Extremity   Splint c/d/i  Ext warm   Minimal swelling  No changes In motor or sensory exam  No pain with passive stretching of toes   Assessment/Plan: 1 Day Post-Op   Principal Problem:   Fall Active Problems:   Acute CVA (cerebrovascular accident) ([place])   Depression with anxiety   Essential hypertension   Alcohol use   Fracture tibia/fibula, left, closed, initial encounter   Anti-infectives (From admission, onward)   Start     Dose/Rate Route Frequency Ordered Stop   02/07/21 1800  ceFAZolin (ANCEF) IVPB 2g/100 mL premix        2 g 200 mL/hr over 30 Minutes Intravenous Every 6 hours 02/07/21 1204 02/08/21 0626   02/06/21 1400  ceFAZolin (ANCEF) IVPB 2g/100 mL premix  Status:  Discontinued        2 g 200 mL/hr over 30 Minutes Intravenous On call to O.R. 02/06/21 2023 02/07/21 0559    .  POD/HD#: 1  65 y/o male s/p fall with closed Left tibia/fibula fracture  -fall  - closed L tibia and fibula fracture s/p IMN tibia, stress stable syndesmosis, non-op tx of fibula  NWB until office follow up then may allow TDWB in CAM boot  Splint x 2 weeks then convert to CAM  Unrestricted ROM L knee  Move toes to help with swelling  Ice and elevate for swelling and pain control    PT/OT evals    ? CIR   - Pain management:  Multimodal  Minimize narcotics  -  ABL anemia/Hemodynamics  Stable   - Medical issues   Per primary team   - DVT/PE prophylaxis:  Ok to resume plavix  - ID:   periop abx  - Metabolic Bone Disease:  Vitamin d levels look good   Suspect poor bone quality is related to poor mobility since stroke   - Activity:  As above  - Dispo:  Therapy evals  Ortho issues stable  Follow up with ortho in 10-14 days      Jari Pigg, PA-C [phone] (C) 02/08/2021, 10:03 AM  [facility] [place] [address] [phone] Jenetta [phone] (F)    After 5pm and on the weekends please log on to Amion, go to orthopaedics and the look under the [facility] Call for the provider(s) on call. You can also call our office at [phone] and then follow the prompts to be connected to the call team.

## 2021-02-08 NOTE — TOC Initial Note (Addendum)
Transition of Care [facility]) - Initial/Assessment Note    Patient Details  Name: Miguel [name] MRN: [id number] Date of Birth: [birth year]  Transition [facility]) CM/SW Contact:    Milinda Antis, [place] Phone Number: 02/08/2021, 1:20 PM  Clinical Narrative:                  CSW received consult for possible home health at time of discharge. CSW spoke with patient's spouse, Blanch Media.  The family is open to home health services.  CSW informed the family that many home health agencies will not accept commercial insurance, and that services may have to be received via outpatient rehab.  The spouse informed CSW that the patient would need a new order for outpatient therapy with neuro.    CSW discussed insurance authorization process.  The patient or wife did not have a preference of agency. Patient's spouse confirmed the DME will be needed.  CSW contacted [employer] and ordered DME.    CSW contacted Amy with [facility] and put in a referral for home health services.     No further questions reported at this time.   Expected Discharge Plan: [place] Barriers to Discharge: Continued Medical Work up   Patient Goals and CMS Choice   CMS Medicare.gov Compare Post Acute Care list provided to:: Patient Represenative (must comment) Choice offered to / list presented to : Spouse  Expected Discharge Plan and Services Expected Discharge Plan: [place]                         DME Arranged: 3-N-1,Wheelchair manual DME Agency: [employer] Date DME Agency Contacted: 02/08/21 Time DME Agency Contacted: 1227 Representative spoke with at DME Agency: Freda Munro     Date [place]: 02/08/21      Prior Living Arrangements/Services   Lives with:: Spouse Patient language and need for interpreter reviewed:: Yes        Need for Family Participation in Patient Care: Yes (Comment) Care giver support system in place?: Yes (comment)   Criminal Activity/Legal  Involvement Pertinent to Current Situation/Hospitalization: No - Comment as needed  Activities of Daily Living Home Assistive Devices/Equipment: Cane (specify quad or straight),Walker (specify type) ADL Screening (condition at time of admission) Patient's cognitive ability adequate to safely complete daily activities?: Yes Is the patient deaf or have difficulty hearing?: No Does the patient have difficulty seeing, even when wearing glasses/contacts?: No Does the patient have difficulty concentrating, remembering, or making decisions?: No Patient able to express need for assistance with ADLs?: Yes Does the patient have difficulty dressing or bathing?: No Independently performs ADLs?: Yes (appropriate for developmental age) Does the patient have difficulty walking or climbing stairs?: Yes Weakness of Legs: Left Weakness of Arms/Hands: Left  Permission Sought/Granted                  Emotional Assessment         Alcohol / Substance Use: Not Applicable Psych Involvement: No (comment)  Admission diagnosis:  Fall [W19.XXXA] Fall, initial encounter [id number]] Closed displaced spiral fracture of shaft of left tibia, initial encounter [S82.242A] Closed fracture of proximal end of left fibula, unspecified fracture morphology, initial encounter [S82.832A] Closed fracture of distal end of left fibula, unspecified fracture morphology, initial encounter [E26.834H] Patient Active Problem List   Diagnosis Date Noted  . Fall 02/05/2021  . Fracture tibia/fibula, left, closed, initial encounter 02/05/2021  . Cerebrovascular accident (CVA) of right  thalamus ([place]) 12/31/2020  . Cerebral infarction due to embolism of left cerebellar artery ([place]) 12/31/2020  . Alcohol use 12/19/2020  . Acute CVA (cerebrovascular accident) ([place]) 12/18/2020  . Depression with anxiety 12/18/2020  . Essential hypertension 12/18/2020  . Isolated cervical dystonia 12/06/2016   PCP:  Josetta Huddle, MD Pharmacy:    [facility] [place]. [address], [place] [address] Phone: [phone] Fax: [place], [place] [facility] [address] [place] [address] Phone: [phone] Fax: [phone]     Social Determinants of Health ([place]) Interventions    Readmission Risk Interventions No flowsheet data found.

## 2021-02-08 NOTE — Progress Notes (Signed)
PROGRESS NOTE    Miguel [name]  VVZ:[id number] DOB: [birth year] DOA: 02/05/2021 PCP: Josetta Huddle, MD     Brief Narrative:  Miguel [name] is a 65 y.o. male with medical history significant of recent CVA about 2 months ago who is recuperating at home with mild right-sided hemiparesis, essential hypertension, depression, hyperlipidemia, previous alcohol use who is currently on Plavix following his CVA.  Patient has been doing great with physical therapy.  Apparently he was downgraded from using the walker to a cane.  He tried to use the cane and be independent.  Wife has told patient to wait for her surgical assistant to get up but before she could get back he tried to do it on his own.  His leg gave out underneath him twisted under him as he heard a crack when he sat on the left leg.  He was brought into the ER where he was found to have distal tibial fracture with some fractures of the fibula on the left side.  Orthopedic surgery consulted.  Patient underwent IMN of tibia on 6/5.   New events last 24 hours / Subjective: Patient resting comfortably in bed.  No new complaints.  He will be working with physical therapy later today  Assessment & Plan:   Principal Problem:   Fall Active Problems:   Acute CVA (cerebrovascular accident) ([place])   Depression with anxiety   Essential hypertension   Alcohol use   Fracture tibia/fibula, left, closed, initial encounter   Left distal tib-fib fracture status post mechanical fall -Orthopedic surgery following -S/p surgical fixation 6/5  -Pain control -Continue PT OT  History of recent CVA -Resume Plavix -Continue Lipitor  Orthostatic hypotension -Continue midodrine  Depression, anxiety -Continue Effexor, Wellbutrin, klonopin    DVT prophylaxis:  SCDs Start: 02/07/21 1205 SCDs Start: 02/06/21 0021  Code Status:     Code Status Orders  (From admission, onward)         Start     Ordered   02/06/21 0021  Full code  Continuous         02/06/21 0022        Code Status History    Date Active Date Inactive Code Status Order ID Comments User Context   02/05/2021 2212 02/06/2021 0022 Full Code [id number]  Elwyn Reach, MD ED   12/31/2020 1424 01/14/2021 1715 Full Code [id number]  Cathlyn Parsons, PA-C Inpatient   12/31/2020 1424 12/31/2020 1424 Full Code [id number]  Cathlyn Parsons, PA-C Inpatient   12/18/2020 1136 12/31/2020 1413 Full Code [id number]  Shelda Pal, DO ED   Advance Care Planning Activity     Family Communication: None at bedside Disposition Plan:  Status is: Inpatient  Remains inpatient appropriate because:Inpatient level of care appropriate due to severity of illness   Dispo: The patient is from: Home              Anticipated d/c is to: Home              Patient currently is not medically stable to d/c.  Continue PT OT, pain control   Difficult to place patient No      Consultants:   Orthopedic surgery   Procedures:   IM tibial fixation 6/5  Antimicrobials:  Anti-infectives (From admission, onward)   Start     Dose/Rate Route Frequency Ordered Stop   02/07/21 1800  ceFAZolin (ANCEF) IVPB 2g/100 mL premix        2 g 200 mL/hr over 30  Minutes Intravenous Every 6 hours 02/07/21 1204 02/08/21 0626   02/06/21 1400  ceFAZolin (ANCEF) IVPB 2g/100 mL premix  Status:  Discontinued        2 g 200 mL/hr over 30 Minutes Intravenous On call to O.R. 02/06/21 0816 02/07/21 0559       Objective: Vitals:   02/07/21 2001 02/08/21 0028 02/08/21 0553 02/08/21 0839  BP: 114/71 122/71 131/89 131/71  Pulse: 87 74 74 80  Resp: 19 19 19 18   Temp: 98.8 F (37.1 C) 98.4 F (36.9 C) 98.2 F (36.8 C) 98 F (36.7 C)  TempSrc:  Oral Oral Oral  SpO2: 94% 94% 96% 97%  Weight:      Height:        Intake/Output Summary (Last 24 hours) at 02/08/2021 1141 Last data filed at 02/08/2021 0843 Gross per 24 hour  Intake 1428.88 ml  Output 2550 ml  Net -1121.12 ml   Filed Weights   02/07/21 0656   Weight: 88.5 kg    Examination: General exam: Appears calm and comfortable  Respiratory system: Clear to auscultation. Respiratory effort normal. Cardiovascular system: S1 & S2 heard, RRR. No pedal edema. Gastrointestinal system: Abdomen is nondistended, soft and nontender. Normal bowel sounds heard. Central nervous system: Alert and oriented. Non focal exam. Speech clear  Extremities: LLE in cast  Skin: No rashes, lesions or ulcers on exposed skin  Psychiatry: Judgement and insight appear stable. Mood & affect appropriate.   Data Reviewed: I have personally reviewed following labs and imaging studies  CBC: Recent Labs  Lab 02/05/21 1915 02/06/21 0400 02/07/21 0324 02/08/21 0256  WBC 4.8 4.6 5.1 5.8  NEUTROABS 2.6  --   --   --   HGB 12.4* 11.8* 12.1* 10.2*  HCT 36.2* 33.7* 35.0* 30.1*  MCV 97.6 96.8 97.0 98.7  PLT 211 174 164 627   Basic Metabolic Panel: Recent Labs  Lab 02/05/21 1915 02/06/21 0400 02/07/21 0324 02/08/21 0256  NA 132* 135 137 137  K 3.5 3.5 4.3 4.2  CL 97* 102 101 102  CO2 22 25 27 29   GLUCOSE 108* 117* 144* 189*  BUN 11 7* 7* 9  CREATININE 0.81 0.66 0.76 0.90  CALCIUM 8.7* 8.7* 9.0 9.0   GFR: Estimated Creatinine Clearance: 91 mL/min (by C-G formula based on SCr of 0.9 mg/dL). Liver Function Tests: Recent Labs  Lab 02/06/21 0400 02/08/21 0256  AST 20 15  ALT 30 20  ALKPHOS 69 60  BILITOT 1.0 0.4  PROT 5.7* 5.6*  ALBUMIN 3.5 3.0*   No results for input(s): LIPASE, AMYLASE in the last 168 hours. No results for input(s): AMMONIA in the last 168 hours. Coagulation Profile: Recent Labs  Lab 02/05/21 1915  INR 1.1   Cardiac Enzymes: No results for input(s): CKTOTAL, CKMB, CKMBINDEX, TROPONINI in the last 168 hours. BNP (last 3 results) No results for input(s): PROBNP in the last 8760 hours. HbA1C: No results for input(s): HGBA1C in the last 72 hours. CBG: No results for input(s): GLUCAP in the last 168 hours. Lipid Profile: No  results for input(s): CHOL, HDL, LDLCALC, TRIG, CHOLHDL, LDLDIRECT in the last 72 hours. Thyroid Function Tests: No results for input(s): TSH, T4TOTAL, FREET4, T3FREE, THYROIDAB in the last 72 hours. Anemia Panel: No results for input(s): VITAMINB12, FOLATE, FERRITIN, TIBC, IRON, RETICCTPCT in the last 72 hours. Sepsis Labs: No results for input(s): PROCALCITON, LATICACIDVEN in the last 168 hours.  Recent Results (from the past 240 hour(s))  Resp Panel by RT-PCR (  Flu A&B, Covid) Nasopharyngeal Swab     Status: None   Collection Time: 02/05/21  8:40 PM   Specimen: Nasopharyngeal Swab; Nasopharyngeal(NP) swabs in vial transport medium  Result Value Ref Range Status   SARS Coronavirus 2 by RT PCR NEGATIVE NEGATIVE Final    Comment: (NOTE) SARS-CoV-2 target nucleic acids are NOT DETECTED.  The SARS-CoV-2 RNA is generally detectable in upper respiratory specimens during the acute phase of infection. The lowest concentration of SARS-CoV-2 viral copies this assay can detect is 138 copies/mL. A negative result does not preclude SARS-Cov-2 infection and should not be used as the sole basis for treatment or other patient management decisions. A negative result may occur with  improper specimen collection/handling, submission of specimen other than nasopharyngeal swab, presence of viral mutation(s) within the areas targeted by this assay, and inadequate number of viral copies(<138 copies/mL). A negative result must be combined with clinical observations, patient history, and epidemiological information. The expected result is Negative.  Fact Sheet for Patients:  EntrepreneurPulse.com.au  Fact Sheet for Healthcare Providers:  IncredibleEmployment.be  This test is no t yet approved or cleared by the [place] FDA and  has been authorized for detection and/or diagnosis of SARS-CoV-2 by FDA under an Emergency Use Authorization (EUA). This EUA will remain   in effect (meaning this test can be used) for the duration of the COVID-19 declaration under Section 564(b)(1) of the Act, 21 U.S.C.section 360bbb-3(b)(1), unless the authorization is terminated  or revoked sooner.       Influenza A by PCR NEGATIVE NEGATIVE Final   Influenza B by PCR NEGATIVE NEGATIVE Final    Comment: (NOTE) The Xpert Xpress SARS-CoV-2/FLU/RSV plus assay is intended as an aid in the diagnosis of influenza from Nasopharyngeal swab specimens and should not be used as a sole basis for treatment. Nasal washings and aspirates are unacceptable for Xpert Xpress SARS-CoV-2/FLU/RSV testing.  Fact Sheet for Patients: EntrepreneurPulse.com.au  Fact Sheet for Healthcare Providers: IncredibleEmployment.be  This test is not yet approved or cleared by the [place] FDA and has been authorized for detection and/or diagnosis of SARS-CoV-2 by FDA under an Emergency Use Authorization (EUA). This EUA will remain in effect (meaning this test can be used) for the duration of the COVID-19 declaration under Section 564(b)(1) of the Act, 21 U.S.C. section 360bbb-3(b)(1), unless the authorization is terminated or revoked.  Performed at [facility], [place] [address], [place], [place] [address]   Surgical PCR screen     Status: None   Collection Time: 02/06/21  6:50 PM   Specimen: Nasal Mucosa; Nasal Swab  Result Value Ref Range Status   MRSA, PCR NEGATIVE NEGATIVE Final   Staphylococcus aureus NEGATIVE NEGATIVE Final    Comment: (NOTE) The Xpert SA Assay (FDA approved for NASAL specimens in patients 20 years of age and older), is one component of a comprehensive surveillance program. It is not intended to diagnose infection nor to guide or monitor treatment. Performed at [facility], [place] [address], [place], [place] [address]       Radiology Studies: DG Tibia/Fibula Left  Result Date: 02/07/2021 CLINICAL DATA:   Operative/perioperative imaging for ORIF of tibial and fibular fractures EXAM: DG C-ARM 1-60 MIN; LEFT TIBIA AND FIBULA - 2 VIEW FLUOROSCOPY TIME:  Fluoroscopy Time:  1 minutes 50 seconds Radiation Exposure Index (if provided by the fluoroscopic device): 4.22 MGY Number of Acquired Spot Images: 18 COMPARISON:  Radiograph February 05, 2021 FINDINGS: 18 C-arm fluoroscopic images  were obtained intraoperatively and submitted for post operative interpretation. These images demonstrate changes associated with open reduction and internal fixation of a tibial spiral fracture. Intramedullary rod and screw fixation hardware is visualized within the tibia with improved alignment of the fracture fragments. Please see the performing provider's procedural report for further detail. IMPRESSION: Status post ORIF with intramedullary rod and nail fixation of the spiral type fracture of the tibia with improved alignment. Unchanged appearance of the multipart fibular fracture. Electronically Signed   By: Dahlia Bailiff MD   On: 02/07/2021 12:49   DG Ankle Complete Left  Result Date: 02/07/2021 CLINICAL DATA:  Postoperative evaluation EXAM: LEFT ANKLE COMPLETE - 3+ VIEW COMPARISON:  Radiographs February 07, 2021 and February 05, 2021 FINDINGS: Overlying casting material obscures fine bony and soft tissue detail. Partially visualized postsurgical change of open reduction internal fixation of the spiral type tibial fracture with improved alignment now with 2-3 mm of lateral displacement of the distal fracture fragment. Similar alignment of the distal aspect of the multipart fibular fracture. IMPRESSION: Status post open reduction internal fixation of the spiral type tibial fracture with improved alignment of the distal aspect, without evidence of acute hardware complication. Electronically Signed   By: Dahlia Bailiff MD   On: 02/07/2021 14:30   DG Tibia/Fibula Left Port  Result Date: 02/07/2021 CLINICAL DATA:  Postoperative evaluation EXAM:  PORTABLE LEFT TIBIA AND FIBULA - 2 VIEW COMPARISON:  Radiographs February 07, 2021 and February 05, 2021 FINDINGS: Overlying casting material obscures fine bony and soft tissue detail. Postsurgical change of open reduction internal fixation of the tibial spiral fracture no evidence of acute hardware complication. Improved alignment now with mild 2-3 mm of lateral displacement of the distal fracture fragment. No significant change in the multipart fractures of the proximal and distal fibula. IMPRESSION: Status post open reduction internal fixation of the tibial spiral fracture with improved alignment. No evidence of acute hardware complication. Stable appearance of the multipart fibular fracture. Electronically Signed   By: Dahlia Bailiff MD   On: 02/07/2021 14:26   DG C-Arm 1-60 Min  Result Date: 02/07/2021 CLINICAL DATA:  Operative/perioperative imaging for ORIF of tibial and fibular fractures EXAM: DG C-ARM 1-60 MIN; LEFT TIBIA AND FIBULA - 2 VIEW FLUOROSCOPY TIME:  Fluoroscopy Time:  1 minutes 50 seconds Radiation Exposure Index (if provided by the fluoroscopic device): 4.22 MGY Number of Acquired Spot Images: 18 COMPARISON:  Radiograph February 05, 2021 FINDINGS: 18 C-arm fluoroscopic images were obtained intraoperatively and submitted for post operative interpretation. These images demonstrate changes associated with open reduction and internal fixation of a tibial spiral fracture. Intramedullary rod and screw fixation hardware is visualized within the tibia with improved alignment of the fracture fragments. Please see the performing provider's procedural report for further detail. IMPRESSION: Status post ORIF with intramedullary rod and nail fixation of the spiral type fracture of the tibia with improved alignment. Unchanged appearance of the multipart fibular fracture. Electronically Signed   By: Dahlia Bailiff MD   On: 02/07/2021 12:49      Scheduled Meds: . acetaminophen  500 mg Oral Q8H  . atorvastatin  80 mg  Oral Daily  . buPROPion  150 mg Oral Daily  . cholecalciferol  2,000 Units Oral Daily  . clonazePAM  1 mg Oral Daily  . clopidogrel  75 mg Oral Daily  . docusate sodium  100 mg Oral BID  . midodrine  10 mg Oral TID WC  . multivitamin with minerals  1  tablet Oral Daily  . pantoprazole  40 mg Oral Daily  . venlafaxine XR  75 mg Oral Q breakfast   Continuous Infusions: . 0.9 % NaCl with KCl 20 mEq / L 50 mL/hr at 02/08/21 0600     LOS: 3 days      Time spent: 20 minutes   Dessa Phi, DO [facility] 02/08/2021, 11:41 AM   Available via Epic secure chat 7am-7pm After these hours, please refer to coverage provider listed on [email or web address]

## 2021-02-09 ENCOUNTER — Other Ambulatory Visit (HOSPITAL_COMMUNITY): Payer: Self-pay

## 2021-02-09 MED ORDER — HYDROCODONE-ACETAMINOPHEN 5-325 MG PO TABS
1.0000 | ORAL_TABLET | Freq: Four times a day (QID) | ORAL | 0 refills | Status: AC | PRN
  Filled 2021-02-09: qty 30, 4d supply, fill #0

## 2021-02-09 NOTE — Discharge Summary (Addendum)
Physician Discharge Summary  Miguel [name] ZOX:[id number] DOB: 17-Mar-1956 DOA: 02/05/2021  PCP: Josetta Huddle, MD  Admit date: 02/05/2021 Discharge date: 02/09/2021  Admitted From: Home Disposition:  Home   Recommendations for Outpatient Follow-up:  Follow up with orthopedic surgery in 10 - 14 days   Discharge Condition: Stable CODE STATUS: Full code Diet recommendation: Heart healthy  Brief/Interim Summary: Miguel [name] a 65 y.o.malewith medical history significant ofrecent CVA about 2 months ago who is recuperating at home with mild right-sided hemiparesis, essential hypertension, depression, hyperlipidemia, previous alcohol use who is currently on Plavix following his CVA. Patient has been doing great with physical therapy. Apparently he was downgraded from using the walker to a cane. He tried to use the cane and be independent. Wife has told patient to wait for her surgical assistant to get up but before she could get back he tried to do it on his own. His leg gave out underneath him twisted under him as he heard a crack when he sat on the left leg. He was brought into the ER where he was found to have distal tibial fracture with some fractures of the fibula on the left side.  Orthopedic surgery consulted.  Patient underwent IMN of tibia on 6/5.   During his hospitalization, he had complained of some left hand paresthesias which were intermittent and chronic in nature dating from before his stroke.  CT head was obtained which was unremarkable for acute abnormality.  On day of discharge, patient was feeling well, paresthesias improved, ready to go home with home health.   Discharge Diagnoses:  Principal Problem:   Fall Active Problems:   Acute CVA (cerebrovascular accident) ([place])   Depression with anxiety   Essential hypertension   Alcohol use   Fracture tibia/fibula, left, closed, initial encounter   Left distal tib-fib fracture status post mechanical fall -Orthopedic surgery  following -S/p surgical fixation 6/5  -Pain control -PT OT recommending home health -Follow-up with orthopedic surgery in 10 to 14 days  History of recent CVA -Continue Plavix, Lipitor  Orthostatic hypotension -Continue midodrine  Depression, anxiety -Continue Effexor, Wellbutrin, klonopin   Left hand paresthesia -Chronic in nature, has been intermittent since even before his stroke.  Resolving.  CT head obtained which was unremarkable for acute abnormality   Discharge Instructions  Discharge Instructions    Ambulatory referral to Physical Therapy   Complete by: As directed    Call MD for:  difficulty breathing, headache or visual disturbances   Complete by: As directed    Call MD for:  extreme fatigue   Complete by: As directed    Call MD for:  persistant dizziness or light-headedness   Complete by: As directed    Call MD for:  persistant nausea and vomiting   Complete by: As directed    Call MD for:  severe uncontrolled pain   Complete by: As directed    Call MD for:  temperature >100.4   Complete by: As directed    Discharge instructions   Complete by: As directed    You were cared for by a hospitalist during your hospital stay. If you have any questions about your discharge medications or the care you received while you were in the hospital after you are discharged, you can call the unit and ask to speak with the hospitalist on call if the hospitalist that took care of you is not available. Once you are discharged, your primary care physician will handle any further medical issues. Please  note that NO REFILLS for any discharge medications will be authorized once you are discharged, as it is imperative that you return to your primary care physician (or establish a relationship with a primary care physician if you do not have one) for your aftercare needs so that they can reassess your need for medications and monitor your lab values.   Increase activity slowly   Complete  by: As directed    No wound care   Complete by: As directed      Allergies as of 02/09/2021      Reactions   Aspirin Anaphylaxis   Childhood reaction   Losartan Potassium    Other reaction(s): shakes/nervousness   Sildenafil Citrate    Other reaction(s): severe headache      Medication List    STOP taking these medications   traMADol 50 MG tablet Commonly known as: ULTRAM     TAKE these medications   acetaminophen 500 MG tablet Commonly known as: TYLENOL Take 500 mg by mouth every 6 (six) hours as needed for moderate pain or headache.   atorvastatin 80 MG tablet Commonly known as: LIPITOR Take 1 tablet by mouth Once a day What changed:   how much to take  how to take this  when to take this   buPROPion 150 MG 12 hr tablet Commonly known as: WELLBUTRIN SR TAKE 1 TABLET BY MOUTH ONCE DAILY 90 What changed:   how much to take  how to take this  when to take this   calcium carbonate 500 MG chewable tablet Commonly known as: TUMS - dosed in mg elemental calcium Chew 1 tablet (200 mg of elemental calcium total) by mouth as needed for indigestion or heartburn.   clonazePAM 1 MG tablet Commonly known as: KLONOPIN TAKE 1 TABLET BY MOUTH ONCE A DAY What changed: how much to take   clopidogrel 75 MG tablet Commonly known as: PLAVIX Take 1 tablet by mouth Once a day What changed:   how much to take  how to take this  when to take this   esomeprazole 40 MG capsule Commonly known as: NEXIUM Take 1 capsule (40 mg total) by mouth as needed (heartburn).   HYDROcodone-acetaminophen 5-325 MG tablet Commonly known as: NORCO/VICODIN Take 1-2 tablets by mouth every 6 (six) hours as needed for moderate pain or severe pain (pain score 4-6).   Melatonin 1 MG Chew Chew 0.5 mg by mouth at bedtime as needed (sleep).   midodrine 10 MG tablet Commonly known as: PROAMATINE Take 1 tablet by mouth Three times a day 30 days What changed:   how much to take  how to  take this  when to take this   multivitamin capsule Take 1 capsule by mouth daily.   polyethylene glycol 17 g packet Commonly known as: MIRALAX / GLYCOLAX Take 17 g by mouth 2 (two) times daily.   venlafaxine XR 75 MG 24 hr capsule Commonly known as: EFFEXOR-XR TAKE 1 CAPSULE BY MOUTH ONCE A DAY What changed: how much to take   Vitamin D 50 MCG (2000 UT) Caps Take 1 capsule (2,000 Units total) by mouth daily.            Durable Medical Equipment  (From admission, onward)         Start     Ordered   02/08/21 1145  For home use only DME 3 n 1  Once        02/08/21 1145   02/08/21 1145  For home use only DME wheelchair cushion (seat and back)  Once        02/08/21 1145   02/08/21 1145  For home use only DME standard manual wheelchair with seat cushion  Once       Comments: Patient suffers from L tib-fib fracture and stroke which impairs their ability to perform daily activities like bathing, dressing, grooming, and toileting in the home.  A walker will not resolve issue with performing activities of daily living. A wheelchair will allow patient to safely perform daily activities. Patient can safely propel the wheelchair in the home or has a caregiver who can provide assistance. Length of need 6 months . Accessories: elevating leg rests (ELRs), wheel locks, extensions and anti-tippers.   02/08/21 [place], [facility] Follow up.   Why: [employer] will be providing the durable medical equipment.  Please contact the agency above with any concerns regarding equipment. Contact information: [address] [place] [address] [phone]        Altamese Durand, MD. Schedule an appointment as soon as possible for a visit in 10 day(s).   Specialty: Orthopedic Surgery Contact information: [place] [address] [phone]              Allergies  Allergen Reactions  . Aspirin Anaphylaxis     Childhood reaction  . Losartan Potassium     Other reaction(s): shakes/nervousness  . Sildenafil Citrate     Other reaction(s): severe headache    Consultations:  Orthopedic surgery   Procedures/Studies: DG Tibia/Fibula Left  Result Date: 02/07/2021 CLINICAL DATA:  Operative/perioperative imaging for ORIF of tibial and fibular fractures EXAM: DG C-ARM 1-60 MIN; LEFT TIBIA AND FIBULA - 2 VIEW FLUOROSCOPY TIME:  Fluoroscopy Time:  1 minutes 50 seconds Radiation Exposure Index (if provided by the fluoroscopic device): 4.22 MGY Number of Acquired Spot Images: 18 COMPARISON:  Radiograph February 05, 2021 FINDINGS: 18 C-arm fluoroscopic images were obtained intraoperatively and submitted for post operative interpretation. These images demonstrate changes associated with open reduction and internal fixation of a tibial spiral fracture. Intramedullary rod and screw fixation hardware is visualized within the tibia with improved alignment of the fracture fragments. Please see the performing provider's procedural report for further detail. IMPRESSION: Status post ORIF with intramedullary rod and nail fixation of the spiral type fracture of the tibia with improved alignment. Unchanged appearance of the multipart fibular fracture. Electronically Signed   By: Dahlia Bailiff MD   On: 02/07/2021 12:49   DG Tibia/Fibula Left  Result Date: 02/05/2021 CLINICAL DATA:  Pain following fall EXAM: LEFT TIBIA AND FIBULA - 2 VIEW COMPARISON:  None. FINDINGS: Frontal and lateral views obtained. There is a spiral type fracture of the distal tibial diaphysis located approximately 0.5 cm proximal to the tibial plafond and with lateral displacement and slight posterior angulation of the distal fracture fragment with respect to the proximal fragment. There is a comminuted fracture of the distal fibular metaphysis with alignment near anatomic at this fracture site. There is also a comminuted spiral type fracture of the proximal fibular  diaphysis with alignment near anatomic. No dislocations. There is mild generalized knee joint space narrowing. No erosions. IMPRESSION: 1. Spiral fracture distal tibial diaphysis with lateral displacement and mild posterior angulation of the distal fracture fragment with respect proximal fragment. 2. Comminuted fractures of the proximal and distal aspects of the fibula  with alignment at both fracture sites overall type. 3.  No dislocation. 4.  Somewhat generalized narrowing of knee joint. Electronically Signed   By: Lowella Grip III M.D.   On: 02/05/2021 19:57   DG Ankle Complete Left  Result Date: 02/07/2021 CLINICAL DATA:  Postoperative evaluation EXAM: LEFT ANKLE COMPLETE - 3+ VIEW COMPARISON:  Radiographs February 07, 2021 and February 05, 2021 FINDINGS: Overlying casting material obscures fine bony and soft tissue detail. Partially visualized postsurgical change of open reduction internal fixation of the spiral type tibial fracture with improved alignment now with 2-3 mm of lateral displacement of the distal fracture fragment. Similar alignment of the distal aspect of the multipart fibular fracture. IMPRESSION: Status post open reduction internal fixation of the spiral type tibial fracture with improved alignment of the distal aspect, without evidence of acute hardware complication. Electronically Signed   By: Dahlia Bailiff MD   On: 02/07/2021 14:30   CT HEAD WO CONTRAST  Result Date: 02/08/2021 CLINICAL DATA:  Left upper extremity paresthesia EXAM: CT HEAD WITHOUT CONTRAST TECHNIQUE: Contiguous axial images were obtained from the base of the skull through the vertex without intravenous contrast. COMPARISON:  12/18/2020 FINDINGS: Brain: No evidence of acute infarction, hemorrhage, hydrocephalus, extra-axial collection or mass lesion/mass effect. Low-density changes within the left cerebellar hemisphere compatible with known infarct. No evidence of hemorrhagic transformation. Scattered low-density changes  within the periventricular and subcortical white matter compatible with chronic microvascular ischemic change. Mild diffuse cerebral volume loss. Vascular: Atherosclerotic calcifications involving the large vessels of the skull base. No unexpected hyperdense vessel. Skull: Normal. Negative for fracture or focal lesion. Sinuses/Orbits: No acute finding. Other: None. IMPRESSION: 1. No acute intracranial findings. 2. Low-density changes within the left cerebellar hemisphere compatible with known infarct. No evidence of hemorrhagic transformation. 3. Mild chronic microvascular ischemic change and cerebral volume loss. Electronically Signed   By: Davina Poke D.O.   On: 02/08/2021 19:17   DG Tibia/Fibula Left Port  Result Date: 02/07/2021 CLINICAL DATA:  Postoperative evaluation EXAM: PORTABLE LEFT TIBIA AND FIBULA - 2 VIEW COMPARISON:  Radiographs February 07, 2021 and February 05, 2021 FINDINGS: Overlying casting material obscures fine bony and soft tissue detail. Postsurgical change of open reduction internal fixation of the tibial spiral fracture no evidence of acute hardware complication. Improved alignment now with mild 2-3 mm of lateral displacement of the distal fracture fragment. No significant change in the multipart fractures of the proximal and distal fibula. IMPRESSION: Status post open reduction internal fixation of the tibial spiral fracture with improved alignment. No evidence of acute hardware complication. Stable appearance of the multipart fibular fracture. Electronically Signed   By: Dahlia Bailiff MD   On: 02/07/2021 14:26   DG C-Arm 1-60 Min  Result Date: 02/07/2021 CLINICAL DATA:  Operative/perioperative imaging for ORIF of tibial and fibular fractures EXAM: DG C-ARM 1-60 MIN; LEFT TIBIA AND FIBULA - 2 VIEW FLUOROSCOPY TIME:  Fluoroscopy Time:  1 minutes 50 seconds Radiation Exposure Index (if provided by the fluoroscopic device): 4.22 MGY Number of Acquired Spot Images: 18 COMPARISON:  Radiograph  February 05, 2021 FINDINGS: 18 C-arm fluoroscopic images were obtained intraoperatively and submitted for post operative interpretation. These images demonstrate changes associated with open reduction and internal fixation of a tibial spiral fracture. Intramedullary rod and screw fixation hardware is visualized within the tibia with improved alignment of the fracture fragments. Please see the performing provider's procedural report for further detail. IMPRESSION: Status post ORIF with intramedullary rod and nail fixation  of the spiral type fracture of the tibia with improved alignment. Unchanged appearance of the multipart fibular fracture. Electronically Signed   By: Dahlia Bailiff MD   On: 02/07/2021 12:49       Discharge Exam: Vitals:   02/09/21 0502 02/09/21 0813  BP: (!) 145/92 (!) 158/81  Pulse: 85 84  Resp: 18 20  Temp: 98.7 F (37.1 C) 98.6 F (37 C)  SpO2: 95% 97%    General: Pt is alert, awake, not in acute distress Cardiovascular: RRR, S1/S2 +, no edema Respiratory: CTA bilaterally, no wheezing, no rhonchi, no respiratory distress, no conversational dyspnea  Abdominal: Soft, NT, ND, bowel sounds + Extremities: no edema, no cyanosis, left lower extremity splint in place Psych: Normal mood and affect, stable judgement and insight     The results of significant diagnostics from this hospitalization (including imaging, microbiology, ancillary and laboratory) are listed below for reference.     Microbiology: Recent Results (from the past 240 hour(s))  Resp Panel by RT-PCR (Flu A&B, Covid) Nasopharyngeal Swab     Status: None   Collection Time: 02/05/21  8:40 PM   Specimen: Nasopharyngeal Swab; Nasopharyngeal(NP) swabs in vial transport medium  Result Value Ref Range Status   SARS Coronavirus 2 by RT PCR NEGATIVE NEGATIVE Final    Comment: (NOTE) SARS-CoV-2 target nucleic acids are NOT DETECTED.  The SARS-CoV-2 RNA is generally detectable in upper respiratory specimens during  the acute phase of infection. The lowest concentration of SARS-CoV-2 viral copies this assay can detect is 138 copies/mL. A negative result does not preclude SARS-Cov-2 infection and should not be used as the sole basis for treatment or other patient management decisions. A negative result may occur with  improper specimen collection/handling, submission of specimen other than nasopharyngeal swab, presence of viral mutation(s) within the areas targeted by this assay, and inadequate number of viral copies(<138 copies/mL). A negative result must be combined with clinical observations, patient history, and epidemiological information. The expected result is Negative.  Fact Sheet for Patients:  EntrepreneurPulse.com.au  Fact Sheet for Healthcare Providers:  IncredibleEmployment.be  This test is no t yet approved or cleared by the [place] FDA and  has been authorized for detection and/or diagnosis of SARS-CoV-2 by FDA under an Emergency Use Authorization (EUA). This EUA will remain  in effect (meaning this test can be used) for the duration of the COVID-19 declaration under Section 564(b)(1) of the Act, 21 U.S.C.section 360bbb-3(b)(1), unless the authorization is terminated  or revoked sooner.       Influenza A by PCR NEGATIVE NEGATIVE Final   Influenza B by PCR NEGATIVE NEGATIVE Final    Comment: (NOTE) The Xpert Xpress SARS-CoV-2/FLU/RSV plus assay is intended as an aid in the diagnosis of influenza from Nasopharyngeal swab specimens and should not be used as a sole basis for treatment. Nasal washings and aspirates are unacceptable for Xpert Xpress SARS-CoV-2/FLU/RSV testing.  Fact Sheet for Patients: EntrepreneurPulse.com.au  Fact Sheet for Healthcare Providers: IncredibleEmployment.be  This test is not yet approved or cleared by the [place] FDA and has been authorized for detection and/or  diagnosis of SARS-CoV-2 by FDA under an Emergency Use Authorization (EUA). This EUA will remain in effect (meaning this test can be used) for the duration of the COVID-19 declaration under Section 564(b)(1) of the Act, 21 U.S.C. section 360bbb-3(b)(1), unless the authorization is terminated or revoked.  Performed at [facility], [place] [address], [place], [place] [address]   Surgical PCR screen  Status: None   Collection Time: 02/06/21  6:50 PM   Specimen: Nasal Mucosa; Nasal Swab  Result Value Ref Range Status   MRSA, PCR NEGATIVE NEGATIVE Final   Staphylococcus aureus NEGATIVE NEGATIVE Final    Comment: (NOTE) The Xpert SA Assay (FDA approved for NASAL specimens in patients 39 years of age and older), is one component of a comprehensive surveillance program. It is not intended to diagnose infection nor to guide or monitor treatment. Performed at [facility], [place] [address], [place], [place] [address]      Labs: BNP (last 3 results) No results for input(s): BNP in the last 8760 hours. Basic Metabolic Panel: Recent Labs  Lab 02/05/21 1915 02/06/21 0400 02/07/21 0324 02/08/21 0256  NA 132* 135 137 137  K 3.5 3.5 4.3 4.2  CL 97* 102 101 102  CO2 22 25 27 29   GLUCOSE 108* 117* 144* 189*  BUN 11 7* 7* 9  CREATININE 0.81 0.66 0.76 0.90  CALCIUM 8.7* 8.7* 9.0 9.0   Liver Function Tests: Recent Labs  Lab 02/06/21 0400 02/08/21 0256  AST 20 15  ALT 30 20  ALKPHOS 69 60  BILITOT 1.0 0.4  PROT 5.7* 5.6*  ALBUMIN 3.5 3.0*   No results for input(s): LIPASE, AMYLASE in the last 168 hours. No results for input(s): AMMONIA in the last 168 hours. CBC: Recent Labs  Lab 02/05/21 1915 02/06/21 0400 02/07/21 0324 02/08/21 0256  WBC 4.8 4.6 5.1 5.8  NEUTROABS 2.6  --   --   --   HGB 12.4* 11.8* 12.1* 10.2*  HCT 36.2* 33.7* 35.0* 30.1*  MCV 97.6 96.8 97.0 98.7  PLT 211 174 164 187   Cardiac Enzymes: No results for input(s): CKTOTAL, CKMB, CKMBINDEX,  TROPONINI in the last 168 hours. BNP: Invalid input(s): POCBNP CBG: No results for input(s): GLUCAP in the last 168 hours. D-Dimer No results for input(s): DDIMER in the last 72 hours. Hgb A1c No results for input(s): HGBA1C in the last 72 hours. Lipid Profile No results for input(s): CHOL, HDL, LDLCALC, TRIG, CHOLHDL, LDLDIRECT in the last 72 hours. Thyroid function studies No results for input(s): TSH, T4TOTAL, T3FREE, THYROIDAB in the last 72 hours.  Invalid input(s): FREET3 Anemia work up No results for input(s): VITAMINB12, FOLATE, FERRITIN, TIBC, IRON, RETICCTPCT in the last 72 hours. Urinalysis    Component Value Date/Time   COLORURINE YELLOW 12/18/2020 [place] 12/18/2020 0643   LABSPEC 1.017 12/18/2020 0643   PHURINE 6.0 12/18/2020 [place] 12/18/2020 0643   HGBUR NEGATIVE 12/18/2020 0643   BILIRUBINUR NEGATIVE 12/18/2020 0643   KETONESUR 80 (A) 12/18/2020 0643   PROTEINUR NEGATIVE 12/18/2020 0643   NITRITE NEGATIVE 12/18/2020 0643   LEUKOCYTESUR NEGATIVE 12/18/2020 0643   Sepsis Labs Invalid input(s): PROCALCITONIN,  WBC,  LACTICIDVEN Microbiology Recent Results (from the past 240 hour(s))  Resp Panel by RT-PCR (Flu A&B, Covid) Nasopharyngeal Swab     Status: None   Collection Time: 02/05/21  8:40 PM   Specimen: Nasopharyngeal Swab; Nasopharyngeal(NP) swabs in vial transport medium  Result Value Ref Range Status   SARS Coronavirus 2 by RT PCR NEGATIVE NEGATIVE Final    Comment: (NOTE) SARS-CoV-2 target nucleic acids are NOT DETECTED.  The SARS-CoV-2 RNA is generally detectable in upper respiratory specimens during the acute phase of infection. The lowest concentration of SARS-CoV-2 viral copies this assay can detect is 138 copies/mL. A negative result does not preclude SARS-Cov-2 infection and should  not be used as the sole basis for treatment or other patient management decisions. A negative result may occur with  improper  specimen collection/handling, submission of specimen other than nasopharyngeal swab, presence of viral mutation(s) within the areas targeted by this assay, and inadequate number of viral copies(<138 copies/mL). A negative result must be combined with clinical observations, patient history, and epidemiological information. The expected result is Negative.  Fact Sheet for Patients:  EntrepreneurPulse.com.au  Fact Sheet for Healthcare Providers:  IncredibleEmployment.be  This test is no t yet approved or cleared by the [place] FDA and  has been authorized for detection and/or diagnosis of SARS-CoV-2 by FDA under an Emergency Use Authorization (EUA). This EUA will remain  in effect (meaning this test can be used) for the duration of the COVID-19 declaration under Section 564(b)(1) of the Act, 21 U.S.C.section 360bbb-3(b)(1), unless the authorization is terminated  or revoked sooner.       Influenza A by PCR NEGATIVE NEGATIVE Final   Influenza B by PCR NEGATIVE NEGATIVE Final    Comment: (NOTE) The Xpert Xpress SARS-CoV-2/FLU/RSV plus assay is intended as an aid in the diagnosis of influenza from Nasopharyngeal swab specimens and should not be used as a sole basis for treatment. Nasal washings and aspirates are unacceptable for Xpert Xpress SARS-CoV-2/FLU/RSV testing.  Fact Sheet for Patients: EntrepreneurPulse.com.au  Fact Sheet for Healthcare Providers: IncredibleEmployment.be  This test is not yet approved or cleared by the [place] FDA and has been authorized for detection and/or diagnosis of SARS-CoV-2 by FDA under an Emergency Use Authorization (EUA). This EUA will remain in effect (meaning this test can be used) for the duration of the COVID-19 declaration under Section 564(b)(1) of the Act, 21 U.S.C. section 360bbb-3(b)(1), unless the authorization is terminated or revoked.  Performed at  [facility], [place] [address], [place], [place] [address]   Surgical PCR screen     Status: None   Collection Time: 02/06/21  6:50 PM   Specimen: Nasal Mucosa; Nasal Swab  Result Value Ref Range Status   MRSA, PCR NEGATIVE NEGATIVE Final   Staphylococcus aureus NEGATIVE NEGATIVE Final    Comment: (NOTE) The Xpert SA Assay (FDA approved for NASAL specimens in patients 61 years of age and older), is one component of a comprehensive surveillance program. It is not intended to diagnose infection nor to guide or monitor treatment. Performed at [facility], [place] [address] [place], [place] [address]      Patient was seen and examined on the day of discharge and was found to be in stable condition. Time coordinating discharge: 25 minutes including assessment and coordination of care, as well as examination of the patient.   SIGNED:  Dessa Phi, DO [facility] 02/09/2021, 11:42 AM

## 2021-02-09 NOTE — Progress Notes (Signed)
Pt's discharge summary packet provided by swot team nurse. Pt D/C to [facility] as ordered.all questions and concerns were fully addressed. No complaints. Spouse is responsible for pt;s ride. Pt remains alert/oreinted in no apparent distress.

## 2021-02-09 NOTE — Progress Notes (Signed)
Physical Therapy Treatment Patient Details Name: Miguel [name] MRN: [id number] DOB: [birth year] Today's Date: 02/09/2021    History of Present Illness Miguel [name] is a 65 y.o. male s/p mechanical fall sustaining L proximal and distal fibula fx, L LE NWB. PMH: recent CVA about 2 months ago, mild Left-sided hemiparesis, essential hypertension, depression, hyperlipidemia,  previous alcohol use who is currently on Plavix following his CVA    PT Comments    Pt and spouse present for w/c management and mobility and both were able to return proper management of w/c parts. Pt functioning at min guard for transfers with directional verbal cues. Acute PT to continue to follow to progress indep with transfers.    Follow Up Recommendations  Home health PT;Supervision/Assistance - 24 hour     Equipment Recommendations  3in1 (PT);Wheelchair (measurements PT);Wheelchair cushion (measurements PT) (drop arm BSC)    Recommendations for Other Services       Precautions / Restrictions Precautions Precautions: Fall Precaution Comments: pt with L Lateral lean, residual from recent CVA Restrictions Weight Bearing Restrictions: Yes LLE Weight Bearing: Non weight bearing    Mobility  Bed Mobility Overal bed mobility: Needs Assistance Bed Mobility: Supine to Sit;Sit to Supine     Supine to sit: Supervision Sit to supine: Min assist   General bed mobility comments: pt able to bring self to long sit from [facility] flat, pt uses hand to help lift L LE, per pt MD said not to lift it on it's own, to use his hands, minA for L LE management back into bed    Transfers Overall transfer level: Needs assistance Equipment used: None Transfers: Lateral/Scoot Transfers          Lateral/Scoot Transfers: Min guard General transfer comment: went over w/c set up, part management to prep for transfer in/out of w/c to/from bed. Spouse also present. Pt min guard with max directiona verbal cues forl hand  placement  Ambulation/Gait             General Gait Details: uable at this time   Theme park manager mobility: Yes Wheelchair propulsion: Both upper extremities Wheelchair parts: Needs assistance Distance: 200 Wheelchair Assistance Details (indicate cue type and reason): went over brakes, elevating leg rests and removalbe arm rest, pt returned demo'd all parts, as well as spouse  Modified Rankin (Stroke Patients Only)       Balance Overall balance assessment: Needs assistance Sitting-balance support: Feet supported;No upper extremity supported Sitting balance-Leahy Scale: Fair Sitting balance - Comments: mild L lateral lean, residual from previous stroke                                    Cognition Arousal/Alertness: Awake/alert Behavior During Therapy: WFL for tasks assessed/performed Overall Cognitive Status: Within Functional Limits for tasks assessed                                 General Comments: has residual processing and sequencing deficits from recent CVA      Exercises      General Comments General comments (skin integrity, edema, etc.): no concerns      Pertinent Vitals/Pain Pain Assessment: Faces Faces Pain Scale: Hurts a little bit Pain Location: L Lower leg Pain Descriptors / Indicators: Sore Pain Intervention(s):  Premedicated before session    Home Living                      Prior Function            PT Goals (current goals can now be found in the care plan section) Acute Rehab PT Goals Patient Stated Goal: home Progress towards PT goals: Progressing toward goals    Frequency    Min 4X/week      PT Plan Current plan remains appropriate    Co-evaluation              AM-PAC PT "6 Clicks" Mobility   Outcome Measure  Help needed turning from your back to your side while in a flat bed without using bedrails?: A Little Help  needed moving from lying on your back to sitting on the side of a flat bed without using bedrails?: A Little Help needed moving to and from a bed to a chair (including a wheelchair)?: A Little Help needed standing up from a chair using your arms (e.g., wheelchair or bedside chair)?: A Little Help needed to walk in hospital room?: Total Help needed climbing 3-5 steps with a railing? : Total 6 Click Score: 14    End of Session         PT Visit Diagnosis: Muscle weakness (generalized) (M62.81);Difficulty in walking, not elsewhere classified (R26.2)     Time: 2725-3664 PT Time Calculation (min) (ACUTE ONLY): 30 min  Charges:  $Therapeutic Activity: 8-22 mins $Wheel Chair Management: 8-22 mins                     Kittie Plater, PT, DPT Acute Rehabilitation Services Pager #: [phone] Office #: [phone]    [place]line Lopes 02/09/2021, 12:10 PM

## 2021-02-09 NOTE — TOC Transition Note (Signed)
Transition of Care [facility]) - CM/SW Discharge Note   Patient Details  Name: Miguel [name] MRN: [id number] Date of Birth: 09-Apr-1956  [facility] CM/SW Contact:  Milinda Antis, [place] Phone Number: 02/09/2021, 12:21 PM   Clinical Narrative:     Patient will DC to: Home with Home health Anticipated DC date: 02/09/2021 Family notified: Yes Transport by: [place]   Per MD patient ready for DC to home with home health.  CSW spoke with Lattie Haw at [facility] and was informed that the agency will be able to provide home health services.    DME has already been delivered to the room.  The agency will drop ship the wheel chair cushion.    The family was informed of the agency who will be providing home health services and the contact number.  CSW will sign off for now as social work intervention is no longer needed. Please consult [place] again if new needs arise.    Final next level of care: Home w Home Health Services Barriers to Discharge: Barriers Resolved   Patient Goals and CMS Choice   CMS Medicare.gov Compare Post Acute Care list provided to:: Patient Represenative (must comment) Choice offered to / list presented to : Spouse  Discharge Placement                  Name of family member notified: Miguel [name], Miguel [name] (Spouse)   [phone]    Discharge Plan and Services                DME Arranged: 3-N-1,Wheelchair manual DME Agency: [employer] Date DME Agency Contacted: 02/08/21 Time DME Agency Contacted: 1227 Representative spoke with at DME Agency: Freda Munro HH Arranged: OT,PT [place] Agency:  Jackquline [place]) Date [place]: 02/09/21 Time [place]: 1220 Representative spoke with at [place]: [place] ([place]) Interventions     Readmission Risk Interventions No flowsheet data found.

## 2021-02-10 ENCOUNTER — Ambulatory Visit: Payer: BC Managed Care – PPO | Admitting: Occupational Therapy

## 2021-02-10 ENCOUNTER — Ambulatory Visit: Payer: BC Managed Care – PPO | Admitting: Physical Therapy

## 2021-02-10 DIAGNOSIS — S82242D Displaced spiral fracture of shaft of left tibia, subsequent encounter for closed fracture with routine healing: Secondary | ICD-10-CM | POA: Diagnosis not present

## 2021-02-10 DIAGNOSIS — Z79891 Long term (current) use of opiate analgesic: Secondary | ICD-10-CM | POA: Diagnosis not present

## 2021-02-10 DIAGNOSIS — I69351 Hemiplegia and hemiparesis following cerebral infarction affecting right dominant side: Secondary | ICD-10-CM | POA: Diagnosis not present

## 2021-02-10 DIAGNOSIS — S82452D Displaced comminuted fracture of shaft of left fibula, subsequent encounter for closed fracture with routine healing: Secondary | ICD-10-CM | POA: Diagnosis not present

## 2021-02-10 DIAGNOSIS — I951 Orthostatic hypotension: Secondary | ICD-10-CM | POA: Diagnosis not present

## 2021-02-10 DIAGNOSIS — Z7289 Other problems related to lifestyle: Secondary | ICD-10-CM | POA: Diagnosis not present

## 2021-02-10 DIAGNOSIS — Z9181 History of falling: Secondary | ICD-10-CM | POA: Diagnosis not present

## 2021-02-10 DIAGNOSIS — Z7902 Long term (current) use of antithrombotics/antiplatelets: Secondary | ICD-10-CM | POA: Diagnosis not present

## 2021-02-10 DIAGNOSIS — F418 Other specified anxiety disorders: Secondary | ICD-10-CM | POA: Diagnosis not present

## 2021-02-10 DIAGNOSIS — I10 Essential (primary) hypertension: Secondary | ICD-10-CM | POA: Diagnosis not present

## 2021-02-10 DIAGNOSIS — E785 Hyperlipidemia, unspecified: Secondary | ICD-10-CM | POA: Diagnosis not present

## 2021-02-12 ENCOUNTER — Encounter: Payer: Self-pay | Admitting: Occupational Therapy

## 2021-02-12 ENCOUNTER — Ambulatory Visit: Payer: BC Managed Care – PPO | Admitting: Physical Therapy

## 2021-02-12 DIAGNOSIS — I10 Essential (primary) hypertension: Secondary | ICD-10-CM | POA: Diagnosis not present

## 2021-02-12 DIAGNOSIS — I69351 Hemiplegia and hemiparesis following cerebral infarction affecting right dominant side: Secondary | ICD-10-CM | POA: Diagnosis not present

## 2021-02-12 DIAGNOSIS — I951 Orthostatic hypotension: Secondary | ICD-10-CM | POA: Diagnosis not present

## 2021-02-12 DIAGNOSIS — Z7902 Long term (current) use of antithrombotics/antiplatelets: Secondary | ICD-10-CM | POA: Diagnosis not present

## 2021-02-12 DIAGNOSIS — S82242D Displaced spiral fracture of shaft of left tibia, subsequent encounter for closed fracture with routine healing: Secondary | ICD-10-CM | POA: Diagnosis not present

## 2021-02-12 DIAGNOSIS — Z79891 Long term (current) use of opiate analgesic: Secondary | ICD-10-CM | POA: Diagnosis not present

## 2021-02-12 DIAGNOSIS — E785 Hyperlipidemia, unspecified: Secondary | ICD-10-CM | POA: Diagnosis not present

## 2021-02-12 DIAGNOSIS — Z7289 Other problems related to lifestyle: Secondary | ICD-10-CM | POA: Diagnosis not present

## 2021-02-12 DIAGNOSIS — Z9181 History of falling: Secondary | ICD-10-CM | POA: Diagnosis not present

## 2021-02-12 DIAGNOSIS — F418 Other specified anxiety disorders: Secondary | ICD-10-CM | POA: Diagnosis not present

## 2021-02-12 DIAGNOSIS — S82452D Displaced comminuted fracture of shaft of left fibula, subsequent encounter for closed fracture with routine healing: Secondary | ICD-10-CM | POA: Diagnosis not present

## 2021-02-12 NOTE — Therapy (Signed)
[facility] [address] [place], [place], [address] Phone: [phone]   Fax:  [phone]  Patient Details  Name: Miguel [name] MRN: [id number] Date of Birth: May 14, 1956 Referring Provider:  No ref. provider found  Encounter Date: 02/12/2021   Ethelene Closser 02/12/2021, 10:29 AM OCCUPATIONAL THERAPY DISCHARGE SUMMARY  Visits from Start of Care: 6     Current functional level related to goals / functional outcomes:Pt goals were not assessed as pt fell and sustained a tib/ fib fx on LLE.  Pt is currently NWB Remaining deficits: Decreased balance, decreased functional mobility, cognitive deficits, decreased strength, decreased coordination.   Education / Equipment: Pt was educated in Estate agent for [employer], however education was not completed.   Patient agrees to discharge. Patient goals were not met. Patient is being discharged due to a change in medical status.Theone Murdoch, OTR/L [phone] Phone: [phone] 10:32 AM 02/12/21  [place] [facility] [place] [address] [place], [place], [address] Phone: [phone]   Fax:  [phone]

## 2021-02-13 NOTE — Op Note (Signed)
02/07/2021 1:14 PM  PATIENT:  Miguel [name] 65 y.o.   DATE OF BIRTH: [birth year]  MEDICAL RECORD NUMBER: [id number]  PRE-OPERATIVE DIAGNOSIS:   LEFT Tibia fracture 2.   LEFT SEGMENTAL FIBULA FRACTURE  POST-OPERATIVE DIAGNOSIS:   LEFT Tibia fracture 2.   LEFT SEGMENTAL FIBULA FRACTURE, STABLE ANKLE AND SYNDESMOSIS  PROCEDURE:  Procedure(s): LEFT TIBIAL SHAFT FRACTURE FIXATION (Left) with Biomet Versanail 10 mm X 40.5 cm, statically locked MANUAL APPLICATION OF STRESS LEFT ANKLE AND SYNDESMOSIS  SURGEON:  Surgeon(s) and Role:    Altamese , MD - Primary  ASSISTANTS: PA STUDENT  ANESTHESIA:   none  EBL:  Minimal   BLOOD ADMINISTERED: None  DRAINS: None   LOCAL MEDICATIONS USED:  NONE  SPECIMEN:  No Specimen  DISPOSITION OF SPECIMEN:  N/A  COUNTS:  YES  TOURNIQUET:  * No tourniquets in log *  DICTATION: .Note written in EPIC  PLAN OF CARE: Admit to inpatient   PATIENT DISPOSITION:  PACU - hemodynamically stable.   Delay start of Pharmacological VTE agent (>24hrs) due to surgical blood loss or risk of bleeding: no  BRIEF SUMMARY AND INDICATIONS FOR PROCEDURE:  Miguel [name] is a 65 y.o. who sustained a tibia fracture from ground level fall secondary to recent stroke with associated hemiplegia and walking aids. Dr. Tamera Punt initially saw and examined the patient, holding anticoagulation, but upon further investigation of the fracture pattern and specifically the segmental fibula, asserted that this was outside his scope of practice and should be definitively managed by an orthopaedic traumatologist. Consequently I was asked to consult and manage. Patient denied increasing pain or paresthesia. I also discussed with the patient the risks and benefits of surgery, including the possibility of infection, nerve injury, vessel injury, wound breakdown, arthritis, symptomatic hardware, DVT/ PE, loss of motion, malunion, nonunion, heart attack, stroke, prolonged intubation, and need  for further surgery among others. These risks were acknowledged and consent given to proceed.  BRIEF SUMMARY OF PROCEDURE:  The patient was taken to the operating room after administration of Ancef for antibiotics.  The operative extremity was prepped and draped in the usual fashion.  No tourniquet was used during the procedure.  A 2.5-cm incision was made at the base of the distal pole of patella and extended proximally. A medial parapatellar incision was made, and then the curved cannulated awl advanced into the center of the proximal tibia just medial to the lateral tibial spine and just anterior to the joint surface.  A guidewire was then advanced across the fracture site into the middle of the plafond and checked on AP and LAT images, measuring for nail length on the lateral.  We then performed sequential reaming, encountering chatter at 10 mm, reaming up to 11 mm and placing a 10 x 405 mm nail. We were careful to watch alignment throughout and make sure distal locking bolts were anterior to the fibula. After placing both the distal locks, back slapping was performed to interdigitate the fracture, which resulted in an excellent reduction. Two proximal locks were placed off the jig and checked for position and length.  An assistant was required for the procedure as my assistant performed the reaming and proximal instrumentation while I held reduction. Because of the segmental fibula with both an ankle component and a Maisonneuve component, I applied stress under fluoro to make sure there was not instability of the ankle nor the syndesmosis. There was not. Standard layered closure was performed. A PA student assisted during reaming and  nail placement, as well as wound closure.  The patient was taken to the PACU in stable condition after application of sterile gently compressive dressings and a posterior and stirrup splint because of the fibula pattern.  PROGNOSIS:  The patient will be  weightbearing as tolerated with unrestricted motion of the knee and ankle for the next 6 weeks. CAM boot for support as needed. Resume anticoagulation for stroke which will also provide DVT prophylaxis. F/u in the office in 10-14 days for removal of sutures.     Miguel [name]. Marcelino Scot, M.D.

## 2021-02-15 ENCOUNTER — Encounter: Payer: BC Managed Care – PPO | Admitting: Occupational Therapy

## 2021-02-15 ENCOUNTER — Ambulatory Visit: Payer: BC Managed Care – PPO | Admitting: Physical Therapy

## 2021-02-16 ENCOUNTER — Ambulatory Visit: Payer: BC Managed Care – PPO | Admitting: Physical Therapy

## 2021-02-17 ENCOUNTER — Encounter: Payer: Self-pay | Admitting: Physical Therapy

## 2021-02-17 DIAGNOSIS — S82465D Nondisplaced segmental fracture of shaft of left fibula, subsequent encounter for closed fracture with routine healing: Secondary | ICD-10-CM | POA: Diagnosis not present

## 2021-02-17 DIAGNOSIS — S82242D Displaced spiral fracture of shaft of left tibia, subsequent encounter for closed fracture with routine healing: Secondary | ICD-10-CM | POA: Diagnosis not present

## 2021-02-17 NOTE — Therapy (Signed)
[facility] [address] [place], [place], [address] Phone: [phone]   Fax:  [phone]  Patient Details  Name: Miguel [name] MRN: [id number] Date of Birth: 10-Sep-1955 Referring Provider:  No ref. provider found  Encounter Date: 02/17/2021  PHYSICAL THERAPY DISCHARGE SUMMARY  Visits from Start of Care: 9  Current functional level related to goals / functional outcomes: Not able to assess - pt fell and sustained a tib/fib fx on LLE. Pt is currently NWB.   Remaining deficits: Impaired balance, cognitive deficits, impaired strength, gait abnormalities, decr coordination.    Education / Equipment: Initial HEP for balance, however pt not able to perform now due to NWB status.    Patient agrees to discharge. Patient goals were not met. Patient is being discharged due to a change in medical status.     Arliss Journey, PT, DPT  02/17/2021, 8:20 AM  [facility] [address] [place], [place], [address] Phone: [phone]   Fax:  [phone]

## 2021-02-18 ENCOUNTER — Encounter: Payer: BC Managed Care – PPO | Admitting: Occupational Therapy

## 2021-02-18 ENCOUNTER — Ambulatory Visit: Payer: BC Managed Care – PPO | Admitting: Physical Therapy

## 2021-02-18 DIAGNOSIS — Z7289 Other problems related to lifestyle: Secondary | ICD-10-CM | POA: Diagnosis not present

## 2021-02-18 DIAGNOSIS — Z7902 Long term (current) use of antithrombotics/antiplatelets: Secondary | ICD-10-CM | POA: Diagnosis not present

## 2021-02-18 DIAGNOSIS — Z9181 History of falling: Secondary | ICD-10-CM | POA: Diagnosis not present

## 2021-02-18 DIAGNOSIS — Z79891 Long term (current) use of opiate analgesic: Secondary | ICD-10-CM | POA: Diagnosis not present

## 2021-02-18 DIAGNOSIS — I10 Essential (primary) hypertension: Secondary | ICD-10-CM | POA: Diagnosis not present

## 2021-02-18 DIAGNOSIS — S82452D Displaced comminuted fracture of shaft of left fibula, subsequent encounter for closed fracture with routine healing: Secondary | ICD-10-CM | POA: Diagnosis not present

## 2021-02-18 DIAGNOSIS — E785 Hyperlipidemia, unspecified: Secondary | ICD-10-CM | POA: Diagnosis not present

## 2021-02-18 DIAGNOSIS — I69351 Hemiplegia and hemiparesis following cerebral infarction affecting right dominant side: Secondary | ICD-10-CM | POA: Diagnosis not present

## 2021-02-18 DIAGNOSIS — I951 Orthostatic hypotension: Secondary | ICD-10-CM | POA: Diagnosis not present

## 2021-02-18 DIAGNOSIS — F418 Other specified anxiety disorders: Secondary | ICD-10-CM | POA: Diagnosis not present

## 2021-02-18 DIAGNOSIS — S82242D Displaced spiral fracture of shaft of left tibia, subsequent encounter for closed fracture with routine healing: Secondary | ICD-10-CM | POA: Diagnosis not present

## 2021-02-22 ENCOUNTER — Ambulatory Visit: Payer: BC Managed Care – PPO | Admitting: Physical Therapy

## 2021-02-22 DIAGNOSIS — I10 Essential (primary) hypertension: Secondary | ICD-10-CM | POA: Diagnosis not present

## 2021-02-22 DIAGNOSIS — I69351 Hemiplegia and hemiparesis following cerebral infarction affecting right dominant side: Secondary | ICD-10-CM | POA: Diagnosis not present

## 2021-02-22 DIAGNOSIS — Z79891 Long term (current) use of opiate analgesic: Secondary | ICD-10-CM | POA: Diagnosis not present

## 2021-02-22 DIAGNOSIS — Z7902 Long term (current) use of antithrombotics/antiplatelets: Secondary | ICD-10-CM | POA: Diagnosis not present

## 2021-02-22 DIAGNOSIS — S82452D Displaced comminuted fracture of shaft of left fibula, subsequent encounter for closed fracture with routine healing: Secondary | ICD-10-CM | POA: Diagnosis not present

## 2021-02-22 DIAGNOSIS — F418 Other specified anxiety disorders: Secondary | ICD-10-CM | POA: Diagnosis not present

## 2021-02-22 DIAGNOSIS — E785 Hyperlipidemia, unspecified: Secondary | ICD-10-CM | POA: Diagnosis not present

## 2021-02-22 DIAGNOSIS — Z9181 History of falling: Secondary | ICD-10-CM | POA: Diagnosis not present

## 2021-02-22 DIAGNOSIS — Z7289 Other problems related to lifestyle: Secondary | ICD-10-CM | POA: Diagnosis not present

## 2021-02-22 DIAGNOSIS — I951 Orthostatic hypotension: Secondary | ICD-10-CM | POA: Diagnosis not present

## 2021-02-22 DIAGNOSIS — S82242D Displaced spiral fracture of shaft of left tibia, subsequent encounter for closed fracture with routine healing: Secondary | ICD-10-CM | POA: Diagnosis not present

## 2021-02-23 ENCOUNTER — Encounter: Payer: BC Managed Care – PPO | Admitting: Occupational Therapy

## 2021-02-23 ENCOUNTER — Ambulatory Visit: Payer: BC Managed Care – PPO | Admitting: Physical Therapy

## 2021-02-24 ENCOUNTER — Inpatient Hospital Stay: Payer: Self-pay | Admitting: Adult Health

## 2021-02-24 ENCOUNTER — Other Ambulatory Visit (HOSPITAL_COMMUNITY): Payer: Self-pay

## 2021-02-26 ENCOUNTER — Other Ambulatory Visit: Payer: Self-pay

## 2021-02-26 ENCOUNTER — Encounter: Payer: BC Managed Care – PPO | Admitting: Occupational Therapy

## 2021-02-26 ENCOUNTER — Ambulatory Visit: Payer: BC Managed Care – PPO | Admitting: Occupational Therapy

## 2021-02-26 ENCOUNTER — Encounter: Payer: Self-pay | Admitting: Physical Therapy

## 2021-02-26 ENCOUNTER — Ambulatory Visit: Payer: BC Managed Care – PPO | Admitting: Physical Therapy

## 2021-02-26 DIAGNOSIS — R42 Dizziness and giddiness: Secondary | ICD-10-CM

## 2021-02-26 DIAGNOSIS — R29818 Other symptoms and signs involving the nervous system: Secondary | ICD-10-CM | POA: Diagnosis not present

## 2021-02-26 DIAGNOSIS — R2681 Unsteadiness on feet: Secondary | ICD-10-CM | POA: Diagnosis not present

## 2021-02-26 DIAGNOSIS — M25672 Stiffness of left ankle, not elsewhere classified: Secondary | ICD-10-CM | POA: Diagnosis not present

## 2021-02-26 DIAGNOSIS — R278 Other lack of coordination: Secondary | ICD-10-CM

## 2021-02-26 DIAGNOSIS — M6281 Muscle weakness (generalized): Secondary | ICD-10-CM | POA: Diagnosis not present

## 2021-02-26 DIAGNOSIS — R41842 Visuospatial deficit: Secondary | ICD-10-CM | POA: Diagnosis not present

## 2021-02-26 DIAGNOSIS — R2689 Other abnormalities of gait and mobility: Secondary | ICD-10-CM

## 2021-02-26 DIAGNOSIS — R260 Ataxic gait: Secondary | ICD-10-CM | POA: Diagnosis not present

## 2021-02-26 DIAGNOSIS — R270 Ataxia, unspecified: Secondary | ICD-10-CM | POA: Diagnosis not present

## 2021-02-27 NOTE — Therapy (Addendum)
[facility] [address] [place], [place], [address] Phone: [phone]   Fax:  [phone]  Physical Therapy Evaluation  Patient Details  Name: Miguel [name] MRN: [id number] Date of Birth: May 06, 1956 Referring Provider (PT): Dr. Marcelino Scot   Encounter Date: 02/26/2021   PT End of Session - 02/27/21 0907     Visit Number 1    Number of Visits 13    Date for PT Re-Evaluation 05/28/21    Authorization Type BCBS - VL 30 for PT/OT (will restart on  03/05/21)    PT Start Time 0800    PT Stop Time 0843    PT Time Calculation (min) 43 min    Equipment Utilized During Treatment Gait belt    Activity Tolerance Patient tolerated treatment well    Behavior During Therapy [facility] for tasks assessed/performed             Past Medical History:  Diagnosis Date   Cervical dystonia    Depression    Erectile dysfunction due to arterial insufficiency    Functional dyspepsia    Hypertension    Pancreatitis 2005   likely secondary to alcohol use   Rosacea     Past Surgical History:  Procedure Laterality Date   no past surgery     TIBIA IM NAIL INSERTION Left 02/07/2021   Procedure: INTRAMEDULLARY (IM) NAIL TIBIAL;  Surgeon: Altamese Highwood, MD;  Location: [place];  Service: Orthopedics;  Laterality: Left;    There were no vitals filed for this visit.    Subjective Assessment - 02/26/21 0804     Subjective Had a mechanical fall sustaining L proximal and distal fibula fx on 02/05/21 and had surgery on 02/07/21, L LE now TDWB per Dr. Marcelino Scot. Had HHPT - finished on Monday, was doing some walking with the RW- able to ambulate 25' and did some ROM for his foot. Will be wearing the boot to sleep in for the next couple weeks, can take it off for ROM. Dr. Marcelino Scot wants him to use some kind of bike for movement for his arms and legs. Doing his bathing and dressing by himself. Transferring by himself at home. Has some L shoulder pain/discomfort.     Pertinent History [name]. history of cervical dystonia and receives Xeomin injections in the past followed by neurology service Dr. Evelena Leyden at [facility], hypertension, depression, tobacco and alcohol use    Diagnostic tests MRI showed acute infarct left inferior posterior cerebellum.  Small acute infarct right thalamus.    Patient Stated Goals build up his legs and arms, walking again    Currently in Pain? No/denies               02/26/21 8250  Assessment  Medical Diagnosis CVA/ s/p L tib/fib fx  Referring Provider (PT) Dr. Marcelino Scot  Onset Date/Surgical Date 02/05/21  Hand Dominance Right  Prior Therapy CIR, previous PT here before fall  Precautions  Precautions Fall  Precaution Comments TDWB LLE  Required Braces or Orthoses Other Brace/Splint  Other Brace/Splint CAM boot for all OOB  Restrictions  Weight Bearing Restrictions Yes  LLE Weight Bearing TWB  Balance Screen  Has the patient fallen in the past 6 months Yes  How many times? 1  Has the patient had a decrease in activity level because of a fear of falling?  No (is more careful)  Is the patient reluctant to leave their home because of a fear of falling?  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  Leisure likes outdoors (building things/fixing things, mowing)  Sensation  Light Touch Appears Intact  Additional Comments has tingling down his L arm, thinks  that it is coming from his shoulder  Coordination  Gross Motor Movements are Fluid and Coordinated No  ROM / Strength  AROM / PROM / Strength Strength;AROM  AROM  AROM Assessment Site Ankle  Right/Left Ankle Right;Left  Left Ankle Dorsiflexion 0  Left Ankle Plantar Flexion 30  Strength  Right Hip Flexion 5/5  Left Hip Flexion 5/5  Overall Strength Comments WFL RLE, unable to assess LLE due to CAM boot and fxs  Right/Left Hip Right;Left  Bed Mobility  Supine to Sit Independent  Sit to Supine Independent  Transfers  Transfers Sit to Stand;Stand to Sit;Lateral/Scoot Transfers  Sit to Stand 4: Min guard  Stand to Sit 4: Min guard  Lateral/Scoot Transfers 5: Supervision  Lateral/Scoot Transfer Details (indicate cue type and reason) from w/c > mat table, going to pt's R side  Comments with RW, following TDWB precautions  Ambulation/Gait  Ambulation/Gait Yes  Ambulation/Gait Assistance 4: Min guard  Ambulation/Gait Assistance Details with TDWB precautions with RW, w/c follow for safety but not needed, due to WB status pt is able to stay in midline while ambulating with RW (prior to fall, pt heavily leaned to L)  Ambulation Distance (Feet) 115 Feet  Assistive device Rolling walker  Gait Pattern Step-through pattern;Decreased weight shift to left  Ambulation Surface Level;Indoor  Gait velocity 29.06 seconds in 20' = .68  ft/sec       Objective measurements completed on examination: See above findings.               PT Education - 02/26/21 [phone]     Education Details clinical findings, POC (in regards to visit limit and WB status)    Person(s) Educated Patient    Methods Explanation    Comprehension Verbalized understanding              PT Short Term Goals - 03/01/21 2030       PT SHORT TERM GOAL #1   Title Pt will be independent with LLE strengthening and ROM HEP in order to build upon functional gains made in therapy. ALL STGS DUE 03/22/21    Time 3     Period Weeks    Status New    Target Date 03/22/21      PT SHORT TERM GOAL #2   Title Pt will improve gait speed with RW and appropriate WB precautions to at least .90 ft/sec in order to demo decr fall risk.    Baseline .64 ft/sec    Time 3    Period Weeks    Status New      PT SHORT TERM GOAL #3   Title Pt will perform sit <> stand transfers with appropriate WB precuations and supervision in order to demo improved functional transfers.    Baseline min guard with TDWB precautions    Time 3  Period Weeks    Status New      PT SHORT TERM GOAL #4   Title Pt will ambulate at least 230' with RW and appropriate WB precautions with supervision in order to demo improved household mobility.    Baseline 115' with min guard and RW.    Time 3    Period Weeks    Status New             PT Long Term Goals - 03/01/21 2034       PT LONG TERM GOAL #1   Title Pt will improve gait speed with RW and appropriate WB precautions to at least 1.3 ft/sec in order to demo decr fall risk. ALL LTGS DUE 04/12/21    Baseline .68 ft/sec    Time 6    Period Weeks    Status New    Target Date 04/12/21      PT LONG TERM GOAL #2   Title Pt will ambulate at least 200' with RW and appropriate WB precautions outdoors on unlevel surfaces with supervision in order to demo improved community mobility.    Time 6    Period Weeks    Status New      PT LONG TERM GOAL #3   Title Pt will undergo TUG when appropriate in order to determine fall risk.    Time 6    Period Weeks    Status New      PT LONG TERM GOAL #4   Title Pt will improve FOTO score to 57 in order to demo improved functional outcomes.    Baseline 44    Time 6    Period Weeks    Status New      PT LONG TERM GOAL #5   Title --    Baseline --      PT LONG TERM GOAL #6   Title --    Baseline --    Time --    Period --    Status --               03/01/21 2044  Plan  Clinical Impression Statement Patient is a 65 year old male  referred to Neuro OPPT for s/p mechanical fall sustaining L proximal and distal fibula fx on 02/05/21. Before having a fall, pt was being seen due to s/p CVA 12/16/20 (left inferior posterior cerebellum and right thalamus). Pt is now TWDB on LLE and wearing a CAM boot for all OOB mobility.  Pt's PMH is significant for: Cervical dystonia, HTN, depression, anxiety.  The following deficits were present during the exam: ataxia, decr strength, decr coordination, impaired balance, gait abnormalities, decr ROM of L ankle.  Based on gait speed pt is at an incr risk for falls. pt able to ambulate 28' with RW with TWDB precautions with min guard. Pt would benefit from skilled PT to address these impairments and functional limitations to maximize functional mobility independence  Personal Factors and Comorbidities Comorbidity 3+;Past/Current Experience  Comorbidities Cervical dystonia, HTN, depression, anxiety.  Examination-Activity Limitations Stairs;Stand;Squat;Locomotion Level;Transfers;Bend  Examination-Participation Restrictions Community Activity;Driving;Yard Work;Occupation  Pt will benefit from skilled therapeutic intervention in order to improve on the following deficits Abnormal gait;Decreased coordination;Decreased balance;Decreased safety awareness;Decreased strength;Difficulty walking;Dizziness;Decreased activity tolerance  Stability/Clinical Decision Making Evolving/Moderate complexity  Rehab Potential Good  PT Frequency Other (comment) (3x week for 1 week followed by 1-2x week for 5 weeks)  PT Duration  (3x week for 1 week followed by 1-2x week for 5  weeks)  PT Treatment/Interventions ADLs/Self Care Home Management;Stair training;Gait training;DME Instruction;Functional mobility training;Therapeutic activities;Therapeutic exercise;Neuromuscular re-education;Balance training;Patient/family education;Vestibular;Manual techniques;Passive range of motion  PT Next Visit Plan HEP for L ankle ROM and LLE  strengthening following TDWB precautions. gait with RW. pt reports Dr. Marcelino Scot wanted him to try a seated bike, trial scifit  PT Home Exercise Plan Access Code [place] and Agree with Plan of Care Patient       Patient will benefit from skilled therapeutic intervention in order to improve the following deficits and impairments:     Visit Diagnosis: Other lack of coordination  Dizziness and giddiness  Other symptoms and signs involving the nervous system  Other abnormalities of gait and mobility  Unsteadiness on feet  Stiffness of left ankle, not elsewhere classified  Muscle weakness (generalized)     Problem List Patient Active Problem List   Diagnosis Date Noted   Fall 02/05/2021   Fracture tibia/fibula, left, closed, initial encounter 02/05/2021   Cerebrovascular accident (CVA) of right thalamus ([place]) 12/31/2020   Cerebral infarction due to embolism of left cerebellar artery ([place]) 12/31/2020   Alcohol use 12/19/2020   Acute CVA (cerebrovascular accident) ([place]) 12/18/2020   Depression with anxiety 12/18/2020   Essential hypertension 12/18/2020   Isolated cervical dystonia 12/06/2016    Arliss Journey, PT, DPT 02/27/2021, 9:09 AM  [facility] [address] [place], [place], [address] Phone: [phone]   Fax:  [phone]  Name: Miguel [name] MRN: [id number] Date of Birth: Dec 07, 1955

## 2021-03-01 ENCOUNTER — Ambulatory Visit: Payer: BC Managed Care – PPO | Admitting: Physical Therapy

## 2021-03-01 ENCOUNTER — Encounter: Payer: BC Managed Care – PPO | Admitting: Occupational Therapy

## 2021-03-01 NOTE — Addendum Note (Signed)
Addended by: Arliss Journey on: 03/01/2021 08:50 PM   Modules accepted: Orders

## 2021-03-02 ENCOUNTER — Encounter: Payer: Self-pay | Admitting: Physical Therapy

## 2021-03-02 ENCOUNTER — Other Ambulatory Visit: Payer: Self-pay

## 2021-03-02 ENCOUNTER — Ambulatory Visit: Payer: BC Managed Care – PPO | Admitting: Physical Therapy

## 2021-03-02 DIAGNOSIS — R2681 Unsteadiness on feet: Secondary | ICD-10-CM | POA: Diagnosis not present

## 2021-03-02 DIAGNOSIS — R29818 Other symptoms and signs involving the nervous system: Secondary | ICD-10-CM | POA: Diagnosis not present

## 2021-03-02 DIAGNOSIS — R41842 Visuospatial deficit: Secondary | ICD-10-CM | POA: Diagnosis not present

## 2021-03-02 DIAGNOSIS — R270 Ataxia, unspecified: Secondary | ICD-10-CM | POA: Diagnosis not present

## 2021-03-02 DIAGNOSIS — M25672 Stiffness of left ankle, not elsewhere classified: Secondary | ICD-10-CM | POA: Diagnosis not present

## 2021-03-02 DIAGNOSIS — R260 Ataxic gait: Secondary | ICD-10-CM | POA: Diagnosis not present

## 2021-03-02 DIAGNOSIS — M6281 Muscle weakness (generalized): Secondary | ICD-10-CM | POA: Diagnosis not present

## 2021-03-02 DIAGNOSIS — R2689 Other abnormalities of gait and mobility: Secondary | ICD-10-CM

## 2021-03-02 DIAGNOSIS — R42 Dizziness and giddiness: Secondary | ICD-10-CM | POA: Diagnosis not present

## 2021-03-02 DIAGNOSIS — R278 Other lack of coordination: Secondary | ICD-10-CM | POA: Diagnosis not present

## 2021-03-02 NOTE — Therapy (Signed)
[facility] [address] [place], [place], [address] Phone: [phone]   Fax:  [phone]  Physical Therapy Treatment  Patient Details  Name: Miguel [name] MRN: [id number] Date of Birth: 10-09-1955 Referring Provider (PT): Dr. Marcelino Scot   Encounter Date: 03/02/2021   PT End of Session - 03/02/21 0807     Visit Number 2    Number of Visits 13    Date for PT Re-Evaluation 05/28/21    Authorization Type BCBS - VL 30 for PT/OT (will restart on  03/04/21)    PT Start Time 0804    PT Stop Time 0845    PT Time Calculation (min) 41 min    Equipment Utilized During Treatment Gait belt    Activity Tolerance Patient tolerated treatment well    Behavior During Therapy [facility] for tasks assessed/performed             Past Medical History:  Diagnosis Date   Cervical dystonia    Depression    Erectile dysfunction due to arterial insufficiency    Functional dyspepsia    Hypertension    Pancreatitis 2005   likely secondary to alcohol use   Rosacea     Past Surgical History:  Procedure Laterality Date   no past surgery     TIBIA IM NAIL INSERTION Left 02/07/2021   Procedure: INTRAMEDULLARY (IM) NAIL TIBIAL;  Surgeon: Altamese Jourdanton, MD;  Location: [facility];  Service: Orthopedics;  Laterality: Left;    There were no vitals filed for this visit.   Subjective Assessment - 03/02/21 0806     Subjective No new complaints. No new falls. Some pian along medial aspect of left LE.    Pertinent History [name]. history of cervical dystonia and receives Xeomin injections in the past followed by neurology service Dr. Evelena Leyden at [facility], hypertension, depression, tobacco and alcohol use    How long can you walk comfortably? farthest walk in hospital was 43' with RW.    Diagnostic tests MRI showed acute infarct left inferior posterior cerebellum.  Small acute infarct right thalamus.    Patient Stated Goals build up his  legs and arms, walking again                     [facility] Adult PT Treatment/Exercise - 03/02/21 0808       Transfers   Transfers Sit to Stand;Stand to Sit;Lateral/Scoot Transfers    Sit to Stand 4: Min guard    Stand to Sit 4: Min guard      Ambulation/Gait   Ambulation/Gait Yes    Ambulation/Gait Assistance 4: Min guard    Ambulation/Gait Assistance Details pt able to maintain TDWB with Cam boot on left LE.    Ambulation Distance (Feet) 80 Feet   x2   Assistive device Rolling walker    Gait Pattern Step-through pattern;Decreased weight shift to left    Ambulation Surface Level;Indoor      Exercises   Exercises Other Exercises    Other Exercises  Adapted HEP for left LE strengthening with TDWB status in mind. Refer to [facility] for full details. Will advance as pt's weight bearing status increases. cues needed on correct technique.      Knee/Hip Exercises: Aerobic   Other Aerobic Scifit LE/UE's level 4.0 x8 minutes with goal >/= 80 steps per minute.            Issued to HEP today:  Access Code: [email or web address] URL: [email or web address] Date:  03/02/2021 Prepared by: Willow Ora  Exercises Small Range Straight Leg Raise - 1 x daily - 5 x weekly - 1 sets - 10 reps - 5 hold Sidelying Hip Abduction - 1 x daily - 5 x weekly - 1 sets - 10 reps Seated Hamstring Stretch with Strap - 1 x daily - 5 x weekly - 1 sets - 3 reps - 30 hold Ankle Inversion Eversion Towel Slide - 1 x daily - 5 x weekly - 1 sets - 3-5 reps       PT Education - 03/02/21 1549     Education Details updated HEP for left LE ROM/strengthening    Person(s) Educated Patient;Spouse    Methods Explanation;Demonstration;Verbal cues;Handout    Comprehension Verbalized understanding;Returned demonstration;Verbal cues required;Need further instruction              PT Short Term Goals - 03/01/21 2030       PT SHORT TERM GOAL #1   Title Pt will be independent with LLE strengthening and  ROM HEP in order to build upon functional gains made in therapy. ALL STGS DUE 03/22/21    Time 3    Period Weeks    Status New    Target Date 03/22/21      PT SHORT TERM GOAL #2   Title Pt will improve gait speed with RW and appropriate WB precautions to at least .90 ft/sec in order to demo decr fall risk.    Baseline .94 ft/sec    Time 3    Period Weeks    Status New      PT SHORT TERM GOAL #3   Title Pt will perform sit <> stand transfers with appropriate WB precuations and supervision in order to demo improved functional transfers.    Baseline min guard with TDWB precautions    Time 3    Period Weeks    Status New      PT SHORT TERM GOAL #4   Title Pt will ambulate at least 230' with RW and appropriate WB precautions with supervision in order to demo improved household mobility.    Baseline 115' with min guard and RW.    Time 3    Period Weeks    Status New               PT Long Term Goals - 03/01/21 2034       PT LONG TERM GOAL #1   Title Pt will improve gait speed with RW and appropriate WB precautions to at least 1.3 ft/sec in order to demo decr fall risk. ALL LTGS DUE 04/12/21    Baseline .68 ft/sec    Time 6    Period Weeks    Status New    Target Date 04/12/21      PT LONG TERM GOAL #2   Title Pt will ambulate at least 200' with RW and appropriate WB precautions outdoors on unlevel surfaces with supervision in order to demo improved community mobility.    Time 6    Period Weeks    Status New      PT LONG TERM GOAL #3   Title Pt will undergo TUG when appropriate in order to determine fall risk.    Time 6    Period Weeks    Status New      PT LONG TERM GOAL #4   Title Pt will improve FOTO score to 57 in order to demo improved functional outcomes.    Baseline 44  Time 6    Period Weeks    Status New      PT LONG TERM GOAL #5   Title --    Baseline --      PT LONG TERM GOAL #6   Title --    Baseline --    Time --    Period --    Status --                    Plan - 03/02/21 0808     Clinical Impression Statement Today's skilled session initially focused on adjusting pt's HEP to address left ankle range of motion/strengthening with no weight bearing. Will add ex's in as pt's weight bearing status increasees. Remainder of session continued to focus on gait with RW with emphasis on TDWB and use of Scifit stepper for ROM/strengthening. No other issues noted or reported in session. The pt is progressing and should benefit from continued PT to progress toward unmet goals.    Personal Factors and Comorbidities Comorbidity 3+;Past/Current Experience    Comorbidities Cervical dystonia, HTN, depression, anxiety.    Examination-Activity Limitations Stairs;Stand;Squat;Locomotion Level;Transfers;Bend    Examination-Participation Restrictions Community Activity;Driving;Yard Work;Occupation    Stability/Clinical Decision Making Evolving/Moderate complexity    Rehab Potential Good    PT Frequency Other (comment)   3x week for 1 week followed by 1-2x week for 5 weeks   PT Duration --   3x week for 1 week followed by 1-2x week for 5 weeks   PT Treatment/Interventions ADLs/Self Care Home Management;Stair training;Gait training;DME Instruction;Functional mobility training;Therapeutic activities;Therapeutic exercise;Neuromuscular re-education;Balance training;Patient/family education;Vestibular;Manual techniques;Passive range of motion    PT Next Visit Plan gait with RW. pt reports Dr. Marcelino Scot wanted him to try a seated bike, continue with use of Scifit. ? work on core with air disc/pball.    PT Home Exercise Plan Access Code [email or web address]    Consulted and Agree with Plan of Care Patient             Patient will benefit from skilled therapeutic intervention in order to improve the following deficits and impairments:  Abnormal gait, Decreased coordination, Decreased balance, Decreased safety awareness, Decreased strength, Difficulty walking,  Dizziness, Decreased activity tolerance  Visit Diagnosis: Other abnormalities of gait and mobility  Unsteadiness on feet  Stiffness of left ankle, not elsewhere classified  Muscle weakness (generalized)     Problem List Patient Active Problem List   Diagnosis Date Noted   Fall 02/05/2021   Fracture tibia/fibula, left, closed, initial encounter 02/05/2021   Cerebrovascular accident (CVA) of right thalamus ([place]) 12/31/2020   Cerebral infarction due to embolism of left cerebellar artery ([place]) 12/31/2020   Alcohol use 12/19/2020   Acute CVA (cerebrovascular accident) ([place]) 12/18/2020   Depression with anxiety 12/18/2020   Essential hypertension 12/18/2020   Isolated cervical dystonia 12/06/2016    Willow Ora, PTA, [facility]patient Neuro [facility] [address] [place], [place] [address] [phone] 03/02/21, 3:51 PM   Name: Miguel [name] MRN: [id number] Date of Birth: 1955/11/27

## 2021-03-02 NOTE — Patient Instructions (Signed)
Access Code: [email or web address] URL: [email or web address] Date: 03/02/2021 Prepared by: Willow Ora  Exercises Small Range Straight Leg Raise - 1 x daily - 5 x weekly - 1 sets - 10 reps - 5 hold Sidelying Hip Abduction - 1 x daily - 5 x weekly - 1 sets - 10 reps Seated Hamstring Stretch with Strap - 1 x daily - 5 x weekly - 1 sets - 3 reps - 30 hold Ankle Inversion Eversion Towel Slide - 1 x daily - 5 x weekly - 1 sets - 3-5 reps

## 2021-03-03 ENCOUNTER — Ambulatory Visit: Payer: BC Managed Care – PPO | Admitting: Physical Therapy

## 2021-03-03 ENCOUNTER — Encounter: Payer: Self-pay | Admitting: Physical Therapy

## 2021-03-03 DIAGNOSIS — R41842 Visuospatial deficit: Secondary | ICD-10-CM | POA: Diagnosis not present

## 2021-03-03 DIAGNOSIS — R2689 Other abnormalities of gait and mobility: Secondary | ICD-10-CM

## 2021-03-03 DIAGNOSIS — M6281 Muscle weakness (generalized): Secondary | ICD-10-CM

## 2021-03-03 DIAGNOSIS — R42 Dizziness and giddiness: Secondary | ICD-10-CM | POA: Diagnosis not present

## 2021-03-03 DIAGNOSIS — M25672 Stiffness of left ankle, not elsewhere classified: Secondary | ICD-10-CM

## 2021-03-03 DIAGNOSIS — R260 Ataxic gait: Secondary | ICD-10-CM | POA: Diagnosis not present

## 2021-03-03 DIAGNOSIS — R2681 Unsteadiness on feet: Secondary | ICD-10-CM

## 2021-03-03 DIAGNOSIS — R29818 Other symptoms and signs involving the nervous system: Secondary | ICD-10-CM | POA: Diagnosis not present

## 2021-03-03 DIAGNOSIS — R278 Other lack of coordination: Secondary | ICD-10-CM | POA: Diagnosis not present

## 2021-03-03 DIAGNOSIS — R270 Ataxia, unspecified: Secondary | ICD-10-CM | POA: Diagnosis not present

## 2021-03-04 ENCOUNTER — Encounter: Payer: BC Managed Care – PPO | Admitting: Occupational Therapy

## 2021-03-04 ENCOUNTER — Ambulatory Visit: Payer: BC Managed Care – PPO | Admitting: Physical Therapy

## 2021-03-04 ENCOUNTER — Other Ambulatory Visit: Payer: Self-pay

## 2021-03-04 ENCOUNTER — Encounter: Payer: Self-pay | Admitting: Physical Therapy

## 2021-03-04 ENCOUNTER — Encounter: Payer: Self-pay | Admitting: Adult Health

## 2021-03-04 ENCOUNTER — Ambulatory Visit (INDEPENDENT_AMBULATORY_CARE_PROVIDER_SITE_OTHER): Payer: BC Managed Care – PPO | Admitting: Adult Health

## 2021-03-04 VITALS — BP 116/80 | HR 91 | Ht 72.0 in

## 2021-03-04 DIAGNOSIS — I6523 Occlusion and stenosis of bilateral carotid arteries: Secondary | ICD-10-CM

## 2021-03-04 DIAGNOSIS — E785 Hyperlipidemia, unspecified: Secondary | ICD-10-CM

## 2021-03-04 DIAGNOSIS — I63442 Cerebral infarction due to embolism of left cerebellar artery: Secondary | ICD-10-CM

## 2021-03-04 DIAGNOSIS — I10 Essential (primary) hypertension: Secondary | ICD-10-CM

## 2021-03-04 DIAGNOSIS — R2689 Other abnormalities of gait and mobility: Secondary | ICD-10-CM | POA: Diagnosis not present

## 2021-03-04 DIAGNOSIS — M25672 Stiffness of left ankle, not elsewhere classified: Secondary | ICD-10-CM

## 2021-03-04 DIAGNOSIS — I6502 Occlusion and stenosis of left vertebral artery: Secondary | ICD-10-CM

## 2021-03-04 DIAGNOSIS — R270 Ataxia, unspecified: Secondary | ICD-10-CM | POA: Diagnosis not present

## 2021-03-04 DIAGNOSIS — H539 Unspecified visual disturbance: Secondary | ICD-10-CM

## 2021-03-04 DIAGNOSIS — I639 Cerebral infarction, unspecified: Secondary | ICD-10-CM

## 2021-03-04 DIAGNOSIS — R29818 Other symptoms and signs involving the nervous system: Secondary | ICD-10-CM | POA: Diagnosis not present

## 2021-03-04 DIAGNOSIS — R7303 Prediabetes: Secondary | ICD-10-CM

## 2021-03-04 DIAGNOSIS — R278 Other lack of coordination: Secondary | ICD-10-CM | POA: Diagnosis not present

## 2021-03-04 DIAGNOSIS — I69398 Other sequelae of cerebral infarction: Secondary | ICD-10-CM | POA: Diagnosis not present

## 2021-03-04 DIAGNOSIS — R2681 Unsteadiness on feet: Secondary | ICD-10-CM | POA: Diagnosis not present

## 2021-03-04 DIAGNOSIS — M6281 Muscle weakness (generalized): Secondary | ICD-10-CM | POA: Diagnosis not present

## 2021-03-04 DIAGNOSIS — R260 Ataxic gait: Secondary | ICD-10-CM | POA: Diagnosis not present

## 2021-03-04 DIAGNOSIS — I6381 Other cerebral infarction due to occlusion or stenosis of small artery: Secondary | ICD-10-CM

## 2021-03-04 DIAGNOSIS — R41842 Visuospatial deficit: Secondary | ICD-10-CM | POA: Diagnosis not present

## 2021-03-04 DIAGNOSIS — R42 Dizziness and giddiness: Secondary | ICD-10-CM | POA: Diagnosis not present

## 2021-03-04 NOTE — Therapy (Signed)
[facility] [address] [place], [place], [address] Phone: [phone]   Fax:  [phone]  Physical Therapy Treatment  Patient Details  Name: Miguel [name] MRN: [id number] Date of Birth: [birth year] Referring Provider (PT): Dr. Marcelino [name]   Encounter Date: 03/04/2021   PT End of Session - 03/04/21 0859     Visit Number 4    Number of Visits 13    Date for PT Re-Evaluation 05/28/21    Authorization Type BCBS - VL 30 for PT/OT (will restart on  03/04/21)    PT Start Time 0802    PT Stop Time 0845    PT Time Calculation (min) 43 min    Equipment Utilized During Treatment Gait belt    Activity Tolerance Patient tolerated treatment well    Behavior During Therapy [facility] for tasks assessed/performed             Past Medical History:  Diagnosis Date   Cervical dystonia    Depression    Erectile dysfunction due to arterial insufficiency    Functional dyspepsia    Hypertension    Pancreatitis 2005   likely secondary to alcohol use   Rosacea     Past Surgical History:  Procedure Laterality Date   no past surgery     TIBIA IM NAIL INSERTION Left 02/07/2021   Procedure: INTRAMEDULLARY (IM) NAIL TIBIAL;  Surgeon: Miguel Dec[place], MD;  Location: [facility];  Service: Orthopedics;  Laterality: Left;    There were no vitals filed for this visit.   Subjective Assessment - 03/04/21 0811     Subjective Asking about stairs. Shoulders still feeling a bit of discomfort.    Pertinent History [name]. history of cervical dystonia and receives Xeomin injections in the past followed by neurology service Dr. Evelena [name] at [facility], hypertension, depression, tobacco and alcohol use    How long can you walk comfortably? farthest walk in hospital was 64' with RW.    Diagnostic tests MRI showed acute infarct left inferior posterior cerebellum.  Small acute infarct right thalamus.    Patient Stated Goals build up his legs and  arms, walking again    Currently in Pain? No/denies                               [facility] Adult PT Treatment/Exercise - 03/04/21 0824       Transfers   Transfers Sit to Stand;Stand to Sit;Lateral/Scoot Transfers    Sit to Stand 4: Min guard    Stand to Sit 4: Min guard    Lateral/Scoot Transfers 5: Supervision    Lateral/Scoot Transfer Details (indicate cue type and reason) from w/c to mat table going to pt's R side      Ambulation/Gait   Ambulation/Gait Yes    Ambulation/Gait Assistance 4: Min guard    Ambulation/Gait Assistance Details TWWB precuations with CAM boot on LLE    Ambulation Distance (Feet) 115 Feet    Assistive device Rolling walker    Gait Pattern Step-through pattern;Decreased weight shift to left    Ambulation Surface Level;Indoor    Stairs No    Stairs Assistance Other (comment)    Stairs Assistance Details (indicate cue type and reason) discussed stairs and pt's spouse bought in pictures from their house, asked again about using single grab bar to help pull him up 2 steps in the garage (PTA also discussed this with pt yesterday), educated  that this would not provide proper amount of use of BUE to maintain TDWB precautions. pt's wife showed pictures of pt's steps and then with a landing and possibility of bumping up on bottom, discussed do not think that this would be the safest option at this time based on WB status and pt would need to push himself up from a step to stand back up      Therapeutic Activites    Therapeutic Activities Other Therapeutic Activities    Other Therapeutic Activities discussed seated peddle bike that can be purchased from [facility] to use for BUE to help with ROM/stiffness, showed pt and pt's spouse some options that they could purchase.      Exercises   Exercises Ankle    Other Exercises  supine on mat table: x10 reps SLR with LLE with cues to perform quad set first, sidelying hip ABD x10 reps with LLE with cues for  technique and control      Knee/Hip Exercises: Aerobic   Other Aerobic Scifit LE/UE's level 4.5 x 8 minutes with goal >/= 80 steps per minute for strengthening and activity tolerance, ROM.      Ankle Exercises: Supine   Other Supine Ankle Exercises therapist performing PROM into L ankle DF x10 reps                      PT Short Term Goals - 03/01/21 2030       PT SHORT TERM GOAL #1   Title Pt will be independent with LLE strengthening and ROM HEP in order to build upon functional gains made in therapy. ALL STGS DUE 03/22/21    Time 3    Period Weeks    Status New    Target Date 03/22/21      PT SHORT TERM GOAL #2   Title Pt will improve gait speed with RW and appropriate WB precautions to at least .90 ft/sec in order to demo decr fall risk.    Baseline .56 ft/sec    Time 3    Period Weeks    Status New      PT SHORT TERM GOAL #3   Title Pt will perform sit <> stand transfers with appropriate WB precuations and supervision in order to demo improved functional transfers.    Baseline min guard with TDWB precautions    Time 3    Period Weeks    Status New      PT SHORT TERM GOAL #4   Title Pt will ambulate at least 230' with RW and appropriate WB precautions with supervision in order to demo improved household mobility.    Baseline 115' with min guard and RW.    Time 3    Period Weeks    Status New               PT Long Term Goals - 03/01/21 2034       PT LONG TERM GOAL #1   Title Pt will improve gait speed with RW and appropriate WB precautions to at least 1.3 ft/sec in order to demo decr fall risk. ALL LTGS DUE 04/12/21    Baseline .68 ft/sec    Time 6    Period Weeks    Status New    Target Date 04/12/21      PT LONG TERM GOAL #2   Title Pt will ambulate at least 200' with RW and appropriate WB precautions outdoors on unlevel surfaces with supervision in order  to demo improved community mobility.    Time 6    Period Weeks    Status New      PT  LONG TERM GOAL #3   Title Pt will undergo TUG when appropriate in order to determine fall risk.    Time 6    Period Weeks    Status New      PT LONG TERM GOAL #4   Title Pt will improve FOTO score to 57 in order to demo improved functional outcomes.    Baseline 44    Time 6    Period Weeks    Status New      PT LONG TERM GOAL #5   Title --    Baseline --      PT LONG TERM GOAL #6   Title --    Baseline --    Time --    Period --    Status --                   Plan - 03/04/21 0902     Clinical Impression Statement Further discussed possibility of stairs with pt's spouse bringing in photos from home, tried to problem solve and discussed with current options may not be the safest/feasible due to pt's WB precautions. Remainder of session focused on LLE strengthening/ROM and reviewing technique for SLS and sidelying hip ABD for pt's HEP. Pt to see Dr. Marcelino [name] next wednesday and was able to schedule pt to come in that wednesday afternoon (previously there were no appts). Will continue to progress towards LTGs.    Personal Factors and Comorbidities Comorbidity 3+;Past/Current Experience    Comorbidities Cervical dystonia, HTN, depression, anxiety.    Examination-Activity Limitations Stairs;Stand;Squat;Locomotion Level;Transfers;Bend    Examination-Participation Restrictions Community Activity;Driving;Yard Work;Occupation    Stability/Clinical Decision Making Evolving/Moderate complexity    Rehab Potential Good    PT Frequency Other (comment)   3x week for 1 week followed by 1-2x week for 5 weeks   PT Duration --   3x week for 1 week followed by 1-2x week for 5 weeks   PT Treatment/Interventions ADLs/Self Care Home Management;Stair training;Gait training;DME Instruction;Functional mobility training;Therapeutic activities;Therapeutic exercise;Neuromuscular re-education;Balance training;Patient/family education;Vestibular;Manual techniques;Passive range of motion    PT Next Visit  Plan any changes with WB from Dr. Marcelino [name]? gait with RW. LLE strengthening/ROM, SciFit, work on core with air disc/pball.    PT Home Exercise Plan Access Code [email or web address]    Consulted and Agree with Plan of Care Patient             Patient will benefit from skilled therapeutic intervention in order to improve the following deficits and impairments:  Abnormal gait, Decreased coordination, Decreased balance, Decreased safety awareness, Decreased strength, Difficulty walking, Dizziness, Decreased activity tolerance  Visit Diagnosis: Other abnormalities of gait and mobility  Unsteadiness on feet  Stiffness of left ankle, not elsewhere classified     Problem List Patient Active Problem List   Diagnosis Date Noted   Fall 02/05/2021   Fracture tibia/fibula, left, closed, initial encounter 02/05/2021   Cerebrovascular accident (CVA) of right thalamus ([place]) 12/31/2020   Cerebral infarction due to embolism of left cerebellar artery ([place]) 12/31/2020   Alcohol use 12/19/2020   Acute CVA (cerebrovascular accident) ([place]) 12/18/2020   Depression with anxiety 12/18/2020   Essential hypertension 12/18/2020   Isolated cervical dystonia 12/06/2016    Arliss Journey, PT, DPT  03/04/2021, 9:04 AM  [facility]  [place], [place], [address] Phone: [phone]   Fax:  [phone]  Name: Miguel [name] MRN: [id number] Date of Birth: [birth year]

## 2021-03-04 NOTE — Progress Notes (Signed)
[facility] [address] [place]. [place] [address] [phone]       HOSPITAL FOLLOW UP NOTE  Miguel [name] Date of Birth:  22-Oct-1955 Medical Record Number:  [id number]   Reason for Referral:  hospital stroke follow up    SUBJECTIVE:   CHIEF COMPLAINT:  Chief Complaint  Patient presents with   Follow-up    Rm 14 with spouse joyce  PT is well, had some vision changes in L eye, L hand tingling and L sided facial numbness     HPI:   [name] is a 65 y.o. male with history of Cervical dystonia, Depression, Erectile dysfunction due to arterial insufficiency, Functional dyspepsia, Hypertension, Pancreatitis (2005), heavy EtOH use and Rosacea. who presented on 12/18/2020 with dizziness, gait imbalance, leaning towards left and nausea and vomiting.  Personally reviewed hospitalization pertinent progress notes, lab and imaging summary provided.  Evaluated by Dr. Erlinda Hong with stroke work-up revealing acute left cerebellar and right thalamic ischemic infarct likely due to left VA occlusion possibly dissection due to lawnmower accident versus arthrosclerosis given risk factors.  CTA head and neck showed left VA origin stenosis and occluded at C1.  Bilateral ICA bulb atherosclerosis with right ICA 50% stenosis.  Carotid Doppler unremarkable but confirmed left VA origin stenosis.  2D echo EF 45 to 50%.  LDL 137 and initiate atorvastatin 80 mg daily.  A1c 6.4.  UDS positive for THC.  Recommended DAPT however allergy to aspirin therefore recommended Plavix and cilostazol for 3 weeks and Plavix alone.  PT/OT recommended CIR. D/c to CIR 4/28-5/12.   Today, 03/04/2021, [name] is being seen for hospital follow-up accompanied by his wife, Blanch Media.  He was initially recovering well working with therapy with improvement of balance and graduated from rolling walker to cane but unfortunately on 6/3 he attempted to walk without assistance which led to a fall and resulted in left distal  tibial fracture and some fractures of the fibula and underwent IMN of tibia on 6/5.  Reports he has been recovering well but still wearing full boot and is only modified weightbearing -he routinely follows with orthopedics and continues to work with therapies.  He also reports left forehead and maxillary sinus numbness/tingling - prior to his stroke, ran into a tree branch while riding a lawnmower which hit his left maxillary sinus area and once he stepped off from lawn mower, fell into an air compressor hitting left side forehead and temple.  He also reports left eye concerns which seems more so with having difficulty keeping it in his contact and left lower eyelid ptosis.  He denies any residual visual issues that he experienced with his stroke such as diplopia or blurred vision.  He also reports left hand tingling such as sensation that his hand is asleep but denies associated pain as well as mild left shoulder pain. 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  Denies new stroke/TIA symptoms.  Reports compliance on Plavix and atorvastatin without associated side effects.  Blood pressure today 116/80.  He has been started on midodrine by PCP due to symptomatic hypotension -he has not experienced any additional hypotensive episodes since starting.  No further concerns at this time.    ROS:   14 system review of systems performed and negative with exception of those listed in HPI  PMH:  Past Medical History:  Diagnosis Date   Cervical dystonia    Depression    Erectile dysfunction due to arterial insufficiency    Functional dyspepsia    Hypertension    Pancreatitis 2005   likely secondary to alcohol use   Rosacea     PSH:  Past Surgical History:  Procedure Laterality Date   no past surgery     TIBIA IM NAIL INSERTION Left 02/07/2021   Procedure:  INTRAMEDULLARY (IM) NAIL TIBIAL;  Surgeon: Altamese , MD;  Location: [facility];  Service: Orthopedics;  Laterality: Left;    Social History:  Social History   Socioeconomic History   Marital status: Married    Spouse name: Not on file   Number of children: 0   Years of education: some college   Highest education level: Not on file  Occupational History   Occupation: Associate Professor  Tobacco Use   Smoking status: Former    Pack years: 0.00   Smokeless tobacco: Never  Substance and Sexual Activity   Alcohol use: Yes    Comment: 3 beers per day   Drug use: No   Sexual activity: Not on file  Other Topics Concern   Not on file  Social History Narrative   Lives at home with his wife.   3 cups of caffeine day.   Right-handed.   Social Determinants of Health   Financial Resource Strain: Not on file  Food Insecurity: Not on file  Transportation Needs: Not on file  Physical Activity: Not on file  Stress: Not on file  Social Connections: Not on file  Intimate Partner Violence: Not on file    Family History:  Family History  Problem Relation Age of Onset   Uterine cancer Mother    Emphysema Father     Medications:   Current Outpatient Medications on File Prior to Visit  Medication Sig Dispense Refill   acetaminophen (TYLENOL) 500 MG tablet Take 500 mg by mouth every 6 (six) hours as needed for moderate pain or headache.     atorvastatin (LIPITOR) 80 MG tablet Take 1 tablet by mouth Once a day (Patient taking differently: Take 80 mg by mouth daily.) 90 tablet 3   buPROPion (WELLBUTRIN SR) 150 MG 12 hr tablet TAKE 1 TABLET BY MOUTH ONCE DAILY 90 (Patient taking differently: Take 150 mg by mouth daily.) 90 tablet 3   calcium carbonate (TUMS - DOSED IN MG ELEMENTAL CALCIUM) 500 MG chewable tablet Chew 1 tablet (200 mg of elemental calcium total) by mouth as needed for indigestion or heartburn.     Cholecalciferol (VITAMIN D) 50 MCG (2000 UT) CAPS Take 1 capsule (2,000 Units  total) by mouth daily. 30 capsule 0   clonazePAM (KLONOPIN) 1 MG tablet TAKE 1 TABLET BY MOUTH ONCE A DAY (Patient taking differently: Take 1 mg by mouth daily.) 90 tablet 1   clopidogrel (PLAVIX) 75 MG tablet Take 1 tablet by mouth Once a day (Patient taking differently: Take 75 mg by mouth daily.) 90 tablet 3   HYDROcodone-acetaminophen (NORCO/VICODIN) 5-325 MG tablet Take 1-2 tablets by mouth every 6 (six) hours as needed for moderate pain or severe pain (pain score 4-6). 30 tablet 0   Melatonin 1 MG CHEW Chew 0.5 mg by mouth at bedtime as needed (sleep).     midodrine (PROAMATINE) 10 MG tablet Take 1 tablet by mouth Three times a day 30 days (Patient taking differently: Take 10 mg by mouth 3 (three) times daily.) 90 tablet 2   Multiple Vitamin (  MULTIVITAMIN) capsule Take 1 capsule by mouth daily.     venlafaxine XR (EFFEXOR-XR) 75 MG 24 hr capsule TAKE 1 CAPSULE BY MOUTH ONCE A DAY (Patient taking differently: Take 75 mg by mouth daily.) 90 capsule 3   No current facility-administered medications on file prior to visit.    Allergies:   Allergies  Allergen Reactions   Aspirin Anaphylaxis    Childhood reaction   Losartan Potassium     Other reaction(s): shakes/nervousness   Sildenafil Citrate     Other reaction(s): severe headache      OBJECTIVE:  Physical Exam  Vitals:   03/04/21 0911  BP: 116/80  Pulse: 91  Height: 6' (1.829 m)   Body mass index is 26.45 kg/m. No results found.  General: well developed, well nourished, very pleasant middle-age Caucasian male, seated, in no evident distress Head: head normocephalic and atraumatic.   Neck: supple with no carotid or supraclavicular bruits Cardiovascular: regular rate and rhythm, no murmurs Musculoskeletal: LLE boot in place Skin:  no rash/petichiae Vascular:  Normal pulses all extremities   Neurologic Exam Mental Status: Awake and fully alert.  Fluent speech and language.  Oriented to place and time. Recent and remote  memory intact. Attention span, concentration and fund of knowledge appropriate. Mood and affect appropriate.  Cranial Nerves: Fundoscopic exam reveals sharp disc margins. Pupils equal, briskly reactive to light. Extraocular movements full with 3-5 beat nystagmus bilaterally looking towards the right.  Mild convergence insufficiency of left eye (reports history of left lazy eye).  Visual fields full to confrontation. Hearing intact.  Light touch, pinprick and temperature sensory impairment split down midline left side of face -endorses increased sensory on left side with pinprick.  Lower eyelid ptosis.  Otherwise face, tongue, palate moves normally and symmetrically.  Motor: Normal bulk and tone. Normal strength in all tested extremity muscles except difficulty fully assessing LLE d/t recent fracture and use of boot Sensory.: intact to touch , pinprick , position and vibratory sensation.  Coordination: Rapid alternating movements normal in all extremities. Finger-to-nose mild incoordination LUE and heel-to-shin performed accurately RLE -difficulty assessing LLE due to recent fracture. Gait and Station: Deferred as pt currently only modified weightbearing of LLE Reflexes: 1+ and symmetric. Toes downgoing.     NIHSS  0 Modified Rankin  2      ASSESSMENT: Miguel [name] is a 66 y.o. year old male with recent acute left cerebellar and right thalamic ischemic infarct likely due to L VA occlusion possible dissection versus arthrosclerosis on 12/18/2020.  Vascular risk factors include HTN, HLD, EtOH use, former tobacco use, L VA stenosis and bilateral carotid stenosis.     PLAN:  L cerebellar, right thalamic strokes:  Residual deficit: left hand numbness and gait impairment with imbalance.  Unfortunately, patient suffered a fall on 6/3 with LLE fracture requiring surgical procedure and currently modified weightbearing which has postponed therapy -he plans on restarting therapy for balance once cleared  for full weightbearing and by orthopedics.  He was released back to driving on a graduated return to driving basis but also advised him to get input by Dr. Letta Pate at tomorrow's visit Left facial tingling and eye concerns: Possibly post stroke but underlying suspicion of possibly traumatic nerve injuries that occurred just prior to stroke (ran into a tree branch and fell into an air compressor) especially as area symptoms correlate with where he was injured.  Referral placed to Dr. Katy Fitch for further visual examination and left eye concerns.  Continue clopidogrel  75 mg daily  and atorvastatin 80 mg daily for secondary stroke prevention.  Discussed secondary stroke prevention measures and importance of close PCP follow up for aggressive stroke risk factor management  L VA occlusion, carotid stenosis: Plan to repeat imaging for surveillance monitoring around 06/2021.  Discussed importance of continued use of Plavix and high intensity statin as well as managing stroke risk factors HTN: BP goal <130/90.  Hx of HTN but with more recent hypotension currently on midodrine -continue to monitor at home and routine follow-up with PCP HLD: LDL goal <70. Recent LDL 137 -continue atorvastatin 80 mg daily.  Currently not fasting.  Request follow-up with PCP in the next 1 to 2 months for repeat lipid panel and ongoing prescribing of atorvastatin Pre DMII: A1c goal<7.0. Recent A1c 6.4.  Monitored by PCP    Follow up in 3 months or call earlier if needed   CC:  [place] provider: Dr. Leonie Man PCP: Josetta Huddle, MD    I spent 58 minutes of face-to-face and non-face-to-face time with patient and wife.  This included previsit chart review including review of recent hospitalization, lab review, study review, order entry, electronic health record documentation, patient education regarding recent stroke including etiology as well as secondary stroke prevention measures and importance of aggressive stroke risk factor management  including HTN, HLD, pre-DM, surveillance monitoring of extracranial stenosis, residual deficits and typical recovery time and answered all other questions to patients and wife's satisfaction   [name], AGNP-BC  [facility] [address] [place], [place] [address]  Phone [phone] Fax [phone] Note: This document was prepared with digital dictation and possible smart phrase technology. Any transcriptional errors that result from this process are unintentional.

## 2021-03-04 NOTE — Patient Instructions (Signed)
Continue working with neuro rehab therapies for likely ongoing recovery  I believe that you will be okay to slowly return back to driving on a graduated return to driving basis as instructed below but also would encourage you to discuss this further with Dr. Letta Pate tomorrow to ensure that he agrees Graduated return to driving as recommended.  It is recommended that you first drive with another licensed driver in an empty parking lot. If you do well with this, you can drive on a quiet street with the licensed driver.  If you do well with this, you can drive on a busy street with a licensed driver.  If you continue to do well, you can be cleared to drive independently.  For the first month after resuming driving, it is recommend no nighttime, busy/heavy traffic roads or Interstate driving.   Referral placed to Dr. Katy Fitch for visual concerns  I will keep you updated regarding timeframe of repeating Imaging with a CTA Head/Neck  Continue clopidogrel 75 mg daily  and atorvastatin 80 mg daily for secondary stroke prevention  Continue to follow up with PCP regarding cholesterol and blood pressure management  Maintain strict control of hypertension with blood pressure goal below 130/90 and cholesterol with LDL cholesterol (bad cholesterol) goal below 70 mg/dL.       Followup in the future with me in 3 months or call earlier if needed       Thank you for coming to see [place] at [facility]. I hope we have been able to provide you high quality care today.  You may receive a patient satisfaction survey over the next few weeks. We would appreciate your feedback and comments so that we may continue to improve ourselves and the health of our patients.

## 2021-03-04 NOTE — Therapy (Signed)
[facility] [address] [place], [place], [address] Phone: [phone]   Fax:  [phone]  Physical Therapy Treatment  Patient Details  Name: Miguel [name] MRN: [id number] Date of Birth: 10-Aug-1956 Referring Provider (PT): Dr. Marcelino Scot   Encounter Date: 03/03/2021   PT End of Session - 03/03/21 0808     Visit Number 3    Number of Visits 13    Date for PT Re-Evaluation 05/28/21    Authorization Type BCBS - VL 30 for PT/OT (will restart on  03/04/21)    PT Start Time 0804    PT Stop Time 0845    PT Time Calculation (min) 41 min    Equipment Utilized During Treatment Gait belt    Activity Tolerance Patient tolerated treatment well    Behavior During Therapy [facility] for tasks assessed/performed             Past Medical History:  Diagnosis Date   Cervical dystonia    Depression    Erectile dysfunction due to arterial insufficiency    Functional dyspepsia    Hypertension    Pancreatitis 2005   likely secondary to alcohol use   Rosacea     Past Surgical History:  Procedure Laterality Date   no past surgery     TIBIA IM NAIL INSERTION Left 02/07/2021   Procedure: INTRAMEDULLARY (IM) NAIL TIBIAL;  Surgeon: Altamese Weippe, MD;  Location: [facility];  Service: Orthopedics;  Laterality: Left;    There were no vitals filed for this visit.   Subjective Assessment - 03/03/21 0807     Subjective No new complaints. No changes since yesterday except for a little more swelling in left LE from being in dependent position too long.    Pertinent History [name]. history of cervical dystonia and receives Xeomin injections in the past followed by neurology service Dr. Evelena Leyden at [facility], hypertension, depression, tobacco and alcohol use    How long can you walk comfortably? farthest walk in hospital was 44' with RW.    Diagnostic tests MRI showed acute infarct left inferior posterior cerebellum.  Small acute  infarct right thalamus.    Patient Stated Goals build up his legs and arms, walking again    Currently in Pain? No/denies    Pain Score 0-No pain                  [facility] Adult PT Treatment/Exercise - 03/03/21 0808       Transfers   Transfers Sit to Stand;Stand to Sit;Lateral/Scoot Transfers    Sit to Stand 4: Min guard;With upper extremity assist;From bed;From chair/3-in-1    Stand to Sit 4: Min guard;With upper extremity assist;To bed;To chair/3-in-1    Floor to Transfer 4: Min guard    Floor to Transfer Details (indicate cue type and reason) wheelchair to mat table      Ambulation/Gait   Ambulation/Gait Yes    Ambulation/Gait Assistance 4: Min guard    Ambulation/Gait Assistance Details cues on posture and for toe touch, not flat foot on left with CAM boot on.    Ambulation Distance (Feet) 80 Feet   x2   Assistive device Rolling walker    Gait Pattern Step-through pattern;Decreased weight shift to left    Ambulation Surface Level;Indoor    Stairs No    Stairs Assistance Other (comment)    Stairs Assistance Details (indicate cue type and reason) discussed steps in garage with spouse and patient. Has a ramp  at this time. has a grab bar mounted on the wall next to door that he was using to pull himself up the 2 steps. Discussed this would not allow him to efficiently use his arms to maintain TDWB on left LE. Best way for this to occur would be with using the RW so that he can bear down on it. Will plan to practice this next session (no time left otday). Pt and spouse also wondering about going up flight of steps inside for pt to shower. Discussed bumping up on buttocks as an option Pt and spouse to bring pictures of inside steps to see if this is a feasible option.      Exercises   Exercises Other Exercises    Other Exercises  for left LE strengthening: with 2# ankle weight straight leg raises for 2 sets of 10 reps with cues for slow,controlled movements. with green band  resistance- hip falls outs for 2 sets of 10 reps each side, cues for technique and controlled movements; seated at edge of mat: with green band resistance left HS curls for 2 sets of 10 reps.      Knee/Hip Exercises: Aerobic   Other Aerobic Scifit LE/UE's level 4.0 x 8 minutes with goal >/= 80 steps per minute for strengthening and activity tolerance.                      PT Short Term Goals - 03/01/21 2030       PT SHORT TERM GOAL #1   Title Pt will be independent with LLE strengthening and ROM HEP in order to build upon functional gains made in therapy. ALL STGS DUE 03/22/21    Time 3    Period Weeks    Status New    Target Date 03/22/21      PT SHORT TERM GOAL #2   Title Pt will improve gait speed with RW and appropriate WB precautions to at least .90 ft/sec in order to demo decr fall risk.    Baseline .28 ft/sec    Time 3    Period Weeks    Status New      PT SHORT TERM GOAL #3   Title Pt will perform sit <> stand transfers with appropriate WB precuations and supervision in order to demo improved functional transfers.    Baseline min guard with TDWB precautions    Time 3    Period Weeks    Status New      PT SHORT TERM GOAL #4   Title Pt will ambulate at least 230' with RW and appropriate WB precautions with supervision in order to demo improved household mobility.    Baseline 115' with min guard and RW.    Time 3    Period Weeks    Status New               PT Long Term Goals - 03/01/21 2034       PT LONG TERM GOAL #1   Title Pt will improve gait speed with RW and appropriate WB precautions to at least 1.3 ft/sec in order to demo decr fall risk. ALL LTGS DUE 04/12/21    Baseline .68 ft/sec    Time 6    Period Weeks    Status New    Target Date 04/12/21      PT LONG TERM GOAL #2   Title Pt will ambulate at least 200' with RW and appropriate WB precautions outdoors on  unlevel surfaces with supervision in order to demo improved community mobility.     Time 6    Period Weeks    Status New      PT LONG TERM GOAL #3   Title Pt will undergo TUG when appropriate in order to determine fall risk.    Time 6    Period Weeks    Status New      PT LONG TERM GOAL #4   Title Pt will improve FOTO score to 57 in order to demo improved functional outcomes.    Baseline 44    Time 6    Period Weeks    Status New      PT LONG TERM GOAL #5   Title --    Baseline --      PT LONG TERM GOAL #6   Title --    Baseline --    Time --    Period --    Status --                   Plan - 03/03/21 0808     Clinical Impression Statement Today's skilled session focused on strengthening and gait with RW while TDWB. At end of session pt and spouse asking about how pt would do with stairs- how to best do this with their set up. Briefly discussed garage step that the pull bar would not be adequate for him to use and maintain weight bearing status. Most likely up backwards with walker would be best option. Agreed to re-address this next session due to time constraints this session. No issues noted or reported with session today. The pt is progressing and should benefit from continued PT to progress toward unmet goals.    Personal Factors and Comorbidities Comorbidity 3+;Past/Current Experience    Comorbidities Cervical dystonia, HTN, depression, anxiety.    Examination-Activity Limitations Stairs;Stand;Squat;Locomotion Level;Transfers;Bend    Examination-Participation Restrictions Community Activity;Driving;Yard Work;Occupation    Stability/Clinical Decision Making Evolving/Moderate complexity    Rehab Potential Good    PT Frequency Other (comment)   3x week for 1 week followed by 1-2x week for 5 weeks   PT Duration --   3x week for 1 week followed by 1-2x week for 5 weeks   PT Treatment/Interventions ADLs/Self Care Home Management;Stair training;Gait training;DME Instruction;Functional mobility training;Therapeutic activities;Therapeutic  exercise;Neuromuscular re-education;Balance training;Patient/family education;Vestibular;Manual techniques;Passive range of motion    PT Next Visit Plan further problem solve step negotiation- spouse to bring pictures. continue with strengthening, ROM, Scifit. work on core strengthening as well    PT Home Exercise Plan Access Code [email or web address]    Consulted and Agree with Plan of Care Patient             Patient will benefit from skilled therapeutic intervention in order to improve the following deficits and impairments:  Abnormal gait, Decreased coordination, Decreased balance, Decreased safety awareness, Decreased strength, Difficulty walking, Dizziness, Decreased activity tolerance  Visit Diagnosis: Other abnormalities of gait and mobility  Unsteadiness on feet  Stiffness of left ankle, not elsewhere classified  Muscle weakness (generalized)     Problem List Patient Active Problem List   Diagnosis Date Noted   Fall 02/05/2021   Fracture tibia/fibula, left, closed, initial encounter 02/05/2021   Cerebrovascular accident (CVA) of right thalamus ([place]) 12/31/2020   Cerebral infarction due to embolism of left cerebellar artery ([place]) 12/31/2020   Alcohol use 12/19/2020   Acute CVA (cerebrovascular accident) ([place]) 12/18/2020   Depression with anxiety 12/18/2020  Essential hypertension 12/18/2020   Isolated cervical dystonia 12/06/2016   Willow Ora, PTA, [facility] [address], [place], [place] [address] [phone] 03/04/21, 1:48 PM   Name: Miguel [name] MRN: [id number] Date of Birth: 04/07/1956

## 2021-03-05 ENCOUNTER — Encounter: Payer: BC Managed Care – PPO | Attending: Registered Nurse | Admitting: Physical Medicine & Rehabilitation

## 2021-03-05 ENCOUNTER — Encounter: Payer: Self-pay | Admitting: Physical Medicine & Rehabilitation

## 2021-03-05 VITALS — BP 131/80 | HR 94 | Temp 98.4°F | Ht 72.0 in | Wt 199.8 lb

## 2021-03-05 DIAGNOSIS — I639 Cerebral infarction, unspecified: Secondary | ICD-10-CM | POA: Insufficient documentation

## 2021-03-05 DIAGNOSIS — I6381 Other cerebral infarction due to occlusion or stenosis of small artery: Secondary | ICD-10-CM

## 2021-03-05 DIAGNOSIS — I69398 Other sequelae of cerebral infarction: Secondary | ICD-10-CM

## 2021-03-05 DIAGNOSIS — R269 Unspecified abnormalities of gait and mobility: Secondary | ICD-10-CM | POA: Diagnosis not present

## 2021-03-05 NOTE — Progress Notes (Signed)
Subjective:    Patient ID: Miguel [name], male    DOB: April 11, 1956, 65 y.o.   MRN: [id number] 65 y.o. right-handed male with history of cervical dystonia and receives Xeomin injections in the past followed by neurology service Dr. Evelena Leyden at [facility], hypertension, depression, tobacco and alcohol use.  Per chart review lives with spouse independent prior to admission.  Two-level home half bath on main level.  Patient recently lost job 3 weeks prior.  Presented 12/18/2020 with persistent dizziness x1 week as well as left-sided ataxia.  Patient had reported on 12/16/2020 while riding his lawnmower and struck a tree without loss of consciousness.  Cranial CT scan showed no acute traumatic injury identified.  CT cervical spine no acute traumatic injury.  Patient did not receive tPA.  MRI showed acute infarct left inferior posterior cerebellum.  Small acute infarct right thalamus.  CT angiogram head and neck showed a moderate to severe stenosis origin of the left vertebral artery.  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  Atherosclerosis disease in the carotid bifurcation bilaterally 50% diameter stenosis proximal right internal carotid artery.  No significant left carotid stenosis.  Echocardiogram with ejection fraction 45 to 50% mild global hypokinesis, mild MR without AS.  Admission chemistries unremarkable except sodium 134 glucose 165 urinalysis negative nitrite urine drug screen positive marijuana.  Maintained on Plavix for CVA prophylaxis as well as Pletal 100 mg twice daily x3 weeks then Plavix alone.    Admit date: 12/31/2020 Discharge date: 01/14/2021 HPI 65 year old male who suffered left cerebellar infarct 12/18/2020 he has some symptoms prior to presentation.  He also had a small right thalamic infarct.  He has persistent left facial numbness as well as mild left upper extremity numbness.  He has followed up with neurology.  On 6 /5, patient was ambulating with a cane for the first  time instead of a walker.  His therapist recommended close standby assistance but the patient went without assistance and fell, went to the ED where he had x-rays demonstrating left distal tibial fracture as well as fibular fracture.  The patient was seen by Dr. Helane Gunther and underwent IM nail placement the following day. IMPRESSION: Status post open reduction internal fixation of the tibial spiral fracture with improved alignment. No evidence of acute hardware complication.   Stable appearance of the multipart fibular fracture.     Electronically Signed   By: Dahlia Bailiff MD   On: 02/07/2021 14:26 Still has some dizziness when turning his head rapidly to the right side   15lb weight bearing restrictions following left tibial fracture   The patient was seen by [name] from neurology and I reviewed her note which included okay for driving but would like my input. Pain Inventory Average Pain 1 Pain Right Now 0 My pain is tingling  LOCATION OF PAIN  left hand and fingers  BOWEL Number of stools per week: 5   BLADDER Normal    Mobility walk with assistance use a walker how many minutes can you walk?  Limiting walking due to recent tibial fracture ability to climb steps?  no do you drive?  no use a wheelchair needs help with transfers transfers alone  Function not employed: date last employed . I need assistance with the following:  toileting, meal prep, household duties, and shopping  Neuro/Psych weakness tingling trouble walking dizziness depression  Prior Studies Any changes since last visit?  no  Physicians involved in your care Any changes since last  visit?  no   Family History  Problem Relation Age of Onset   Uterine cancer Mother    Emphysema Father    Social History   Socioeconomic History   Marital status: Married    Spouse name: Not on file   Number of children: 0   Years of education: some college   Highest education level: Not  on file  Occupational History   Occupation: Associate Professor  Tobacco Use   Smoking status: Former    Pack years: 0.00   Smokeless tobacco: Never  Substance and Sexual Activity   Alcohol use: Yes    Comment: 3 beers per day   Drug use: No   Sexual activity: Not on file  Other Topics Concern   Not on file  Social History Narrative   Lives at home with his wife.   3 cups of caffeine day.   Right-handed.   Social Determinants of Health   Financial Resource Strain: Not on file  Food Insecurity: Not on file  Transportation Needs: Not on file  Physical Activity: Not on file  Stress: Not on file  Social Connections: Not on file   Past Surgical History:  Procedure Laterality Date   no past surgery     TIBIA IM NAIL INSERTION Left 02/07/2021   Procedure: INTRAMEDULLARY (IM) NAIL TIBIAL;  Surgeon: Altamese [place], MD;  Location: [place];  Service: Orthopedics;  Laterality: Left;   Past Medical History:  Diagnosis Date   Cervical dystonia    Depression    Erectile dysfunction due to arterial insufficiency    Functional dyspepsia    Hypertension    Pancreatitis 2005   likely secondary to alcohol use   Rosacea    BP 131/80   Pulse 94   Temp 98.4 F (36.9 C)   Ht 6' (1.829 m)   Wt 199 lb 12.8 oz (90.6 kg)   SpO2 95%   BMI 27.10 kg/m   Opioid Risk Score:   Fall Risk Score:  `1  Depression screen PHQ 2/9  Depression screen [facility] 2/9 03/05/2021 01/27/2021  Decreased Interest 3 3  Down, Depressed, Hopeless 2 2  PHQ - 2 Score 5 5  Altered sleeping - 0  Tired, decreased energy - 1  Change in appetite - 0  Feeling bad or failure about yourself  - 1  Trouble concentrating - 1  Moving slowly or fidgety/restless - 0  Suicidal thoughts - 0  PHQ-9 Score - 8  Difficult doing work/chores - Very difficult     Review of Systems  Constitutional: Negative.   HENT: Negative.    Eyes: Negative.   Respiratory: Negative.    Cardiovascular:  Positive for leg swelling.   Gastrointestinal: Negative.   Endocrine: Negative.   Genitourinary: Negative.   Musculoskeletal:  Positive for gait problem.  Skin: Negative.   Allergic/Immunologic: Negative.   Neurological:  Positive for dizziness and weakness.       Tingling  Hematological: Negative.   Psychiatric/Behavioral:  Positive for dysphoric mood.   All other systems reviewed and are negative.     Objective:   Physical Exam Vitals and nursing note reviewed.  Constitutional:      Appearance: He is normal weight.  HENT:     Head: Normocephalic and atraumatic.  Eyes:     Extraocular Movements: Extraocular movements intact.     Conjunctiva/sclera: Conjunctivae normal.     Pupils: Pupils are equal, round, and reactive to light.  Neurological:     Mental  Status: He is alert and oriented to person, place, and time.     Comments: Sensation reduced left V1 greater than left V2 Left arm and hand have reduced sensation compared to the right side but still can identify areas touch. Lower extremity sensation is equal bilaterally Motor strength is 5/5 in the right deltoid by stress of grip as well as left deltoid bicep tricep grip Right lower extremity 5/5 in the hip flexor knee extensor ankle dorsiflexor left lower extremity 4/5 in the hip flexor knee extensor ankle not tested secondary to cam walker boot Visual fields are intact confrontation testing Line eyes without nystagmus extraocular muscles intact no evidence of internuclear ophthalmoplegia  Psychiatric:        Mood and Affect: Mood normal.        Behavior: Behavior normal.   Finger-nose-finger intact bilaterally      Assessment & Plan:  1.  CVA right thalamic as well as left cerebellar, still has some residual sensory nerve deficits as well as decreased balance although his limb ataxia has improved.  Has some subjective blurring of vision but no obvious extraocular muscle weakness. Continue outpatient physical therapy.  Do not think he needs OT I  do think he can go back to driving in a graduated fashion as noted below Physical medicine rehab follow-up early October  Graduated return to driving instructions were provided. It is recommended that the patient first drives with another licensed driver in an empty parking lot. If the patient does well with this, and they can drive on a quiet street with the licensed driver. If the patient does well with this they can drive on a busy street with a licensed driver. If the patient does well with this, the next time out they can go by himself. For the first month after resuming driving, I recommend no nighttime or Interstate driving.

## 2021-03-05 NOTE — Patient Instructions (Signed)

## 2021-03-05 NOTE — Progress Notes (Signed)
I agree with the above plan 

## 2021-03-05 NOTE — Progress Notes (Addendum)
Subjective:    Patient ID: Miguel [name], male    DOB: March 21, 1956, 65 y.o.   MRN: [id number] 65 y.o. right-handed male with history of cervical dystonia and receives Xeomin injections in the past followed by neurology service Dr. Evelena Leyden at [facility], hypertension, depression, tobacco and alcohol use.  Per chart review lives with spouse independent prior to admission.  Two-level home half bath on main level.  Patient recently lost job 3 weeks prior.  Presented 12/18/2020 with persistent dizziness x1 week as well as left-sided ataxia.  Patient had reported on 12/16/2020 while riding his lawnmower and struck a tree without loss of consciousness.  Cranial CT scan showed no acute traumatic injury identified.  CT cervical spine no acute traumatic injury.  Patient did not receive tPA.  MRI showed acute infarct left inferior posterior cerebellum.  Small acute infarct right thalamus.  CT angiogram head and neck showed a moderate to severe stenosis origin of the left vertebral artery.  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  Atherosclerosis disease in the carotid bifurcation bilaterally 50% diameter stenosis proximal right internal carotid artery.  No significant left carotid stenosis.  Echocardiogram with ejection fraction 45 to 50% mild global hypokinesis, mild MR without AS.  Admission chemistries unremarkable except sodium 134 glucose 165 urinalysis negative nitrite urine drug screen positive marijuana.  Maintained on Plavix for CVA prophylaxis as well as Pletal 100 mg twice daily x3 weeks then Plavix alone.    Admit date: 12/31/2020 Discharge date: 01/14/2021 HPI 65 year old male who suffered left cerebellar infarct 12/18/2020 he has some symptoms prior to presentation.  He also had a small right thalamic infarct.  He has persistent left facial numbness as well as mild left upper extremity numbness.  He has followed up with neurology.  On 6 /5, patient was ambulating with a cane for the first  time instead of a walker.  His therapist recommended close standby assistance but the patient went without assistance and fell, went to the ED where he had x-rays demonstrating left distal tibial fracture as well as fibular fracture.  The patient was seen by Dr. Helane Gunther and underwent IM nail placement the following day. IMPRESSION: Status post open reduction internal fixation of the tibial spiral fracture with improved alignment. No evidence of acute hardware complication.   Stable appearance of the multipart fibular fracture.     Electronically Signed   By: Dahlia Bailiff MD   On: 02/07/2021 14:26 Still has some dizziness when turning his head rapidly to the right side   15lb weight bearing restrictions following left tibial fracture   The patient was seen by [name] from neurology and I reviewed her note which included okay for driving but would like my input.  Patient is in a wheelchair most times since his fracture.  He does transfer alone.  He needs help with toileting meal prep household duties and shopping but is able to bathe and dress himself.  He drinks 3-5 alcoholic beverages per week he is married 3 level home 2-5 steps per enter unable to enter bathroom Pain Inventory Average Pain 1 Pain Right Now 0 My pain is tingling  LOCATION OF PAIN  left hand and fingers  BOWEL Number of stools per week: 5   BLADDER Normal    Mobility walk with assistance use a walker how many minutes can you walk?  Limiting walking due to recent tibial fracture ability to climb steps?  no do you drive?  no use  a wheelchair needs help with transfers transfers alone  Function not employed: date last employed . I need assistance with the following:  toileting, meal prep, household duties, and shopping  Neuro/Psych weakness tingling trouble walking dizziness depression  Prior Studies Any changes since last visit?  no  Physicians involved in your care Any changes  since last visit?  no   Family History  Problem Relation Age of Onset   Uterine cancer Mother    Emphysema Father    Social History   Socioeconomic History   Marital status: Married    Spouse name: Not on file   Number of children: 0   Years of education: some college   Highest education level: Not on file  Occupational History   Occupation: Associate Professor  Tobacco Use   Smoking status: Former    Pack years: 0.00   Smokeless tobacco: Never  Substance and Sexual Activity   Alcohol use: Yes    Comment: 3 beers per day   Drug use: No   Sexual activity: Not on file  Other Topics Concern   Not on file  Social History Narrative   Lives at home with his wife.   3 cups of caffeine day.   Right-handed.   Social Determinants of Health   Financial Resource Strain: Not on file  Food Insecurity: Not on file  Transportation Needs: Not on file  Physical Activity: Not on file  Stress: Not on file  Social Connections: Not on file   Past Surgical History:  Procedure Laterality Date   no past surgery     TIBIA IM NAIL INSERTION Left 02/07/2021   Procedure: INTRAMEDULLARY (IM) NAIL TIBIAL;  Surgeon: Altamese Bradley, MD;  Location: [place];  Service: Orthopedics;  Laterality: Left;   Past Medical History:  Diagnosis Date   Cervical dystonia    Depression    Erectile dysfunction due to arterial insufficiency    Functional dyspepsia    Hypertension    Pancreatitis 2005   likely secondary to alcohol use   Rosacea    BP 131/80   Pulse 94   Temp 98.4 F (36.9 C)   Ht 6' (1.829 m)   Wt 199 lb 12.8 oz (90.6 kg)   SpO2 95%   BMI 27.10 kg/m   Opioid Risk Score:   Fall Risk Score:  `1  Depression screen PHQ 2/9  Depression screen [facility] 2/9 03/05/2021 01/27/2021  Decreased Interest 3 3  Down, Depressed, Hopeless 2 2  PHQ - 2 Score 5 5  Altered sleeping - 0  Tired, decreased energy - 1  Change in appetite - 0  Feeling bad or failure about yourself  - 1  Trouble  concentrating - 1  Moving slowly or fidgety/restless - 0  Suicidal thoughts - 0  PHQ-9 Score - 8  Difficult doing work/chores - Very difficult     Review of Systems  Constitutional: Negative.   HENT: Negative.    Eyes: Negative.   Respiratory: Negative.    Cardiovascular:  Positive for leg swelling.  Gastrointestinal: Negative.   Endocrine: Negative.   Genitourinary: Negative.   Musculoskeletal:  Positive for gait problem.  Skin: Negative.   Allergic/Immunologic: Negative.   Neurological:  Positive for dizziness and weakness.       Tingling  Hematological: Negative.   Psychiatric/Behavioral:  Positive for dysphoric mood.   All other systems reviewed and are negative.     Objective:   Physical Exam Vitals and nursing note reviewed.  Constitutional:  Appearance: He is normal weight.  HENT:     Head: Normocephalic and atraumatic.  Eyes:     Extraocular Movements: Extraocular movements intact.     Conjunctiva/sclera: Conjunctivae normal.     Pupils: Pupils are equal, round, and reactive to light.  Neurological:     Mental Status: He is alert and oriented to person, place, and time.     Comments: Sensation reduced left V1 greater than left V2 Left arm and hand have reduced sensation compared to the right side but still can identify areas touch. Lower extremity sensation is equal bilaterally Motor strength is 5/5 in the right deltoid by stress of grip as well as left deltoid bicep tricep grip Right lower extremity 5/5 in the hip flexor knee extensor ankle dorsiflexor left lower extremity 4/5 in the hip flexor knee extensor ankle not tested secondary to cam walker boot Visual fields are intact confrontation testing Line eyes without nystagmus extraocular muscles intact no evidence of internuclear ophthalmoplegia  Psychiatric:        Mood and Affect: Mood normal.        Behavior: Behavior normal.   Finger-nose-finger intact bilaterally      Assessment & Plan:  1.   CVA right thalamic as well as left cerebellar, still has some residual sensory nerve deficits as well as decreased balance although his limb ataxia has improved.  Has some subjective blurring of vision but no obvious extraocular muscle weakness. Continue outpatient physical therapy.  Do not think he needs OT I do think he can go back to driving in a graduated fashion as noted below Physical medicine rehab follow-up early October  Graduated return to driving instructions were provided. It is recommended that the patient first drives with another licensed driver in an empty parking lot. If the patient does well with this, and they can drive on a quiet street with the licensed driver. If the patient does well with this they can drive on a busy street with a licensed driver. If the patient does well with this, the next time out they can go by himself. For the first month after resuming driving, I recommend no nighttime or Interstate driving.

## 2021-03-09 ENCOUNTER — Telehealth: Payer: Self-pay | Admitting: Adult Health

## 2021-03-09 ENCOUNTER — Ambulatory Visit: Payer: BC Managed Care – PPO | Admitting: Physical Therapy

## 2021-03-09 NOTE — Telephone Encounter (Signed)
Referral faxed to [facility]. Phone: [phone]. Fax: [phone].

## 2021-03-10 ENCOUNTER — Ambulatory Visit: Payer: BC Managed Care – PPO | Attending: Physician Assistant | Admitting: Physical Therapy

## 2021-03-10 DIAGNOSIS — H2513 Age-related nuclear cataract, bilateral: Secondary | ICD-10-CM | POA: Diagnosis not present

## 2021-03-10 DIAGNOSIS — I6789 Other cerebrovascular disease: Secondary | ICD-10-CM | POA: Diagnosis not present

## 2021-03-10 DIAGNOSIS — R2689 Other abnormalities of gait and mobility: Secondary | ICD-10-CM | POA: Insufficient documentation

## 2021-03-10 DIAGNOSIS — R2681 Unsteadiness on feet: Secondary | ICD-10-CM | POA: Insufficient documentation

## 2021-03-10 DIAGNOSIS — M6281 Muscle weakness (generalized): Secondary | ICD-10-CM | POA: Insufficient documentation

## 2021-03-10 DIAGNOSIS — S82242D Displaced spiral fracture of shaft of left tibia, subsequent encounter for closed fracture with routine healing: Secondary | ICD-10-CM | POA: Diagnosis not present

## 2021-03-10 DIAGNOSIS — M25672 Stiffness of left ankle, not elsewhere classified: Secondary | ICD-10-CM | POA: Insufficient documentation

## 2021-03-10 DIAGNOSIS — S82465D Nondisplaced segmental fracture of shaft of left fibula, subsequent encounter for closed fracture with routine healing: Secondary | ICD-10-CM | POA: Diagnosis not present

## 2021-03-10 DIAGNOSIS — H04123 Dry eye syndrome of bilateral lacrimal glands: Secondary | ICD-10-CM | POA: Diagnosis not present

## 2021-03-10 DIAGNOSIS — S82202A Unspecified fracture of shaft of left tibia, initial encounter for closed fracture: Secondary | ICD-10-CM | POA: Diagnosis not present

## 2021-03-15 ENCOUNTER — Encounter: Payer: Self-pay | Admitting: Occupational Therapy

## 2021-03-15 NOTE — Therapy (Deleted)
[facility] [address] [place], [place], [address] Phone: [phone]   Fax:  [phone]  Patient Details  Name: Miguel [name] MRN: [id number] Date of Birth: 1956/07/20 Referring Provider:  No ref. provider found  Encounter Date: 03/15/2021  OCCUPATIONAL THERAPY DISCHARGE SUMMARY  Visits from Start of Care: ***  Current functional level related to goals / functional outcomes: ***   Remaining deficits: ***   Education / Equipment: ***   Patient agrees to discharge. Patient goals were {OP Goals:25702}. Patient is being discharged due to {OP Discharge Reasons:25703}.    [facility] 03/15/2021, 2:11 PM  [facility] [address] [place], [place], [address] Phone: [phone]   Fax:  [phone]

## 2021-03-16 ENCOUNTER — Ambulatory Visit: Payer: BC Managed Care – PPO | Admitting: Physical Therapy

## 2021-03-17 ENCOUNTER — Ambulatory Visit: Payer: BC Managed Care – PPO | Admitting: Physical Therapy

## 2021-03-17 ENCOUNTER — Encounter: Payer: Self-pay | Admitting: Physical Therapy

## 2021-03-17 ENCOUNTER — Other Ambulatory Visit: Payer: Self-pay

## 2021-03-17 DIAGNOSIS — R2689 Other abnormalities of gait and mobility: Secondary | ICD-10-CM

## 2021-03-17 DIAGNOSIS — M25672 Stiffness of left ankle, not elsewhere classified: Secondary | ICD-10-CM

## 2021-03-17 DIAGNOSIS — M6281 Muscle weakness (generalized): Secondary | ICD-10-CM

## 2021-03-17 DIAGNOSIS — R2681 Unsteadiness on feet: Secondary | ICD-10-CM

## 2021-03-17 NOTE — Therapy (Signed)
[facility] [address] [place], [place], [address] Phone: [phone]   Fax:  [phone]  Physical Therapy Treatment  Patient Details  Name: Nikan Ellingson MRN: [id number] Date of Birth: 1955-12-20 Referring Provider (PT): Dr. Marcelino Scot   Encounter Date: 03/17/2021   PT End of Session - 03/17/21 1015     Visit Number 5    Number of Visits 13    Date for PT Re-Evaluation 05/28/21    Authorization Type BCBS - VL 30 for PT/OT (will restart on  03/04/21)    Authorization - Visit Number 2    Authorization - Number of Visits 30    PT Start Time 0801    PT Stop Time 0846    PT Time Calculation (min) 45 min    Equipment Utilized During Treatment Gait belt    Activity Tolerance Patient tolerated treatment well    Behavior During Therapy [facility] for tasks assessed/performed             Past Medical History:  Diagnosis Date   Cervical dystonia    Depression    Erectile dysfunction due to arterial insufficiency    Functional dyspepsia    Hypertension    Pancreatitis 2005   likely secondary to alcohol use   Rosacea     Past Surgical History:  Procedure Laterality Date   no past surgery     TIBIA IM NAIL INSERTION Left 02/07/2021   Procedure: INTRAMEDULLARY (IM) NAIL TIBIAL;  Surgeon: Altamese Keener, MD;  Location: [facility];  Service: Orthopedics;  Laterality: Left;    There were no vitals filed for this visit.   Subjective Assessment - 03/17/21 0804     Subjective Saw Dr. Marcelino Scot last week - upgraded to progressive WB in CAM, ok to participate in balance exercises. Sees again in 2 weeks. Gets tired quickly and still has to be in his wheelchair.    Pertinent History [name]. history of cervical dystonia and receives Xeomin injections in the past followed by neurology service Dr. Evelena Leyden at [facility], hypertension, depression, tobacco and alcohol use    How long can you walk comfortably? farthest walk in  hospital was 23' with RW.    Diagnostic tests MRI showed acute infarct left inferior posterior cerebellum.  Small acute infarct right thalamus.    Patient Stated Goals build up his legs and arms, walking again    Currently in Pain? Yes    Pain Score 4     Pain Location Leg    Pain Orientation Right    Pain Descriptors / Indicators --   reports sciatic pain                              [facility] Adult PT Treatment/Exercise - 03/17/21 1005       Transfers   Transfers Sit to Stand;Stand to Sit;Lateral/Scoot Transfers    Sit to Stand 4: Min guard    Stand to Sit 4: Min guard    Comments initial cues for proper UE placement for sit <> stands, approx 5 reps performed throughout session      Ambulation/Gait   Ambulation/Gait Yes    Ambulation/Gait Assistance 4: Min guard    Ambulation/Gait Assistance Details pt returns with progressive WB on LLE with CAM boot (50-75%), initial cues for proper technique and sequencing with pt needing cues to maintain in BOS with RW when performing turns such as to  sit back down on mat table    Ambulation Distance (Feet) 230 Feet   x1   Assistive device Rolling walker   L CAM boot   Gait Pattern Decreased weight shift to left;Step-to pattern    Ambulation Surface Level;Indoor    Stairs Yes    Stairs Assistance 4: Min guard    Stairs Assistance Details (indicate cue type and reason) needing cues for proper sequencing - up with the good leg and down with the bad leg with 50% weight bearing through BUE and 50% through LLE (as pt had not been performing going down the stairs with proper sequencing), pt performing with holding onto one rail and using the wall at home for other UE support and going down backwards. tried x1 rep going downstairs forwards, but educated to continue with going down backwards for safety.    Stair Management Technique Two rails;Step to pattern;Backwards;Forwards    Number of Stairs 4   x4 reps   Height of Stairs 6     Gait Comments gait training around 3 cones for obstacle negotiation and on red floor mat as an unlevel surface (cues for proper sequencing to step on and off with RW), cues to stay inside BOS of RW at times when turning performed over approx. 30' - x2 reps      Exercises   Exercises Knee/Hip      Knee/Hip Exercises: Standing   Hip Abduction Stengthening;Left;2 sets;10 reps;Knee straight   open chain   Abduction Limitations cues for proper technique for hip ABD activation, pt reporting in standing was a little bothersome for his L ankle      Knee/Hip Exercises: Supine   Straight Leg Raises Strengthening;AROM;Left;2 sets;10 reps    Straight Leg Raises Limitations using 2# ankle weight, pt able to perform with proper technique      Knee/Hip Exercises: Sidelying   Hip ABduction Strengthening;Left;1 set;10 reps    Hip ABduction Limitations keeping LLE straight, needing initial tactile and verbal cues for proper technique and to avoid compensatory movements                    PT Education - 03/17/21 1013     Education Details discussed POC, visit limit, and co-pay. pt's spouse reports that pt will be on Medicare in December so not as worried about visit limit. wants to go ahead and schedule 2x a week for the next 2 weeks. pt reporting that his L shoulder has been bothersome and has some numbness/tingling in his L hand esp after sleeping - discussed will monitor how shoulder is feeling and possibly would benefit from an OT referral to address.    Person(s) Educated Patient;Spouse    Methods Explanation    Comprehension Verbalized understanding              PT Short Term Goals - 03/01/21 2030       PT SHORT TERM GOAL #1   Title Pt will be independent with LLE strengthening and ROM HEP in order to build upon functional gains made in therapy. ALL STGS DUE 03/22/21    Time 3    Period Weeks    Status New    Target Date 03/22/21      PT SHORT TERM GOAL #2   Title Pt will  improve gait speed with RW and appropriate WB precautions to at least .90 ft/sec in order to demo decr fall risk.    Baseline .68 ft/sec    Time 3  Period Weeks    Status New      PT SHORT TERM GOAL #3   Title Pt will perform sit <> stand transfers with appropriate WB precuations and supervision in order to demo improved functional transfers.    Baseline min guard with TDWB precautions    Time 3    Period Weeks    Status New      PT SHORT TERM GOAL #4   Title Pt will ambulate at least 230' with RW and appropriate WB precautions with supervision in order to demo improved household mobility.    Baseline 115' with min guard and RW.    Time 3    Period Weeks    Status New               PT Long Term Goals - 03/01/21 2034       PT LONG TERM GOAL #1   Title Pt will improve gait speed with RW and appropriate WB precautions to at least 1.3 ft/sec in order to demo decr fall risk. ALL LTGS DUE 04/12/21    Baseline .68 ft/sec    Time 6    Period Weeks    Status New    Target Date 04/12/21      PT LONG TERM GOAL #2   Title Pt will ambulate at least 200' with RW and appropriate WB precautions outdoors on unlevel surfaces with supervision in order to demo improved community mobility.    Time 6    Period Weeks    Status New      PT LONG TERM GOAL #3   Title Pt will undergo TUG when appropriate in order to determine fall risk.    Time 6    Period Weeks    Status New      PT LONG TERM GOAL #4   Title Pt will improve FOTO score to 57 in order to demo improved functional outcomes.    Baseline 44    Time 6    Period Weeks    Status New      PT LONG TERM GOAL #5   Title --    Baseline --      PT LONG TERM GOAL #6   Title --    Baseline --    Time --    Period --    Status --                   Plan - 03/17/21 1019     Clinical Impression Statement Pt returns to PT today after having appt with Dr. Marcelino Scot which pt has been cleared for progressive weight bearing  (beginning at 50%) with LLE and CAM boot. Performed gait training with RW with initial cues for technique. Went over stair training with pt and pt's spouse as pt has been performing at home (with wife there and gait belt), pt needing cues for proper sequencing when descending stairs going backwards. Will continue to progress towards LTGs.    Personal Factors and Comorbidities Comorbidity 3+;Past/Current Experience    Comorbidities Cervical dystonia, HTN, depression, anxiety.    Examination-Activity Limitations Stairs;Stand;Squat;Locomotion Level;Transfers;Bend    Examination-Participation Restrictions Community Activity;Driving;Yard Work;Occupation    Stability/Clinical Decision Making Evolving/Moderate complexity    Rehab Potential Good    PT Frequency Other (comment)   3x week for 1 week followed by 1-2x week for 5 weeks   PT Duration --   3x week for 1 week followed by 1-2x week for 5 weeks  PT Treatment/Interventions ADLs/Self Care Home Management;Stair training;Gait training;DME Instruction;Functional mobility training;Therapeutic activities;Therapeutic exercise;Neuromuscular re-education;Balance training;Patient/family education;Vestibular;Manual techniques;Passive range of motion    PT Next Visit Plan goals due 7/18. progressive WB status - beginning at 50%. gait with RW - working on gait endurance. standing balance adhering to precautions and add to HEP as appropriate. LLE strengthening/ROM, SciFit, work on core with air disc/pball.    PT Home Exercise Plan Access Code [email or web address]    Consulted and Agree with Plan of Care Patient             Patient will benefit from skilled therapeutic intervention in order to improve the following deficits and impairments:  Abnormal gait, Decreased coordination, Decreased balance, Decreased safety awareness, Decreased strength, Difficulty walking, Dizziness, Decreased activity tolerance  Visit Diagnosis: Other abnormalities of gait and  mobility  Stiffness of left ankle, not elsewhere classified  Unsteadiness on feet  Muscle weakness (generalized)     Problem List Patient Active Problem List   Diagnosis Date Noted   Fall 02/05/2021   Fracture tibia/fibula, left, closed, initial encounter 02/05/2021   Cerebrovascular accident (CVA) of right thalamus ([place]) 12/31/2020   Cerebral infarction due to embolism of left cerebellar artery ([facility]) 12/31/2020   Alcohol use 12/19/2020   Acute CVA (cerebrovascular accident) ([place]) 12/18/2020   Depression with anxiety 12/18/2020   Essential hypertension 12/18/2020   Isolated cervical dystonia 12/06/2016    Arliss Journey, PT, DPT  03/17/2021, 10:22 AM  [facility] [address] [place], [place], [address] Phone: [phone]   Fax:  [phone]  Name: Tadd Holtmeyer MRN: [id number] Date of Birth: 15-Mar-1956

## 2021-03-18 ENCOUNTER — Other Ambulatory Visit (HOSPITAL_COMMUNITY): Payer: Self-pay

## 2021-03-19 ENCOUNTER — Other Ambulatory Visit (HOSPITAL_COMMUNITY): Payer: Self-pay

## 2021-03-19 ENCOUNTER — Ambulatory Visit: Payer: BC Managed Care – PPO | Admitting: Physical Therapy

## 2021-03-24 ENCOUNTER — Other Ambulatory Visit: Payer: Self-pay

## 2021-03-24 ENCOUNTER — Ambulatory Visit: Payer: BC Managed Care – PPO | Admitting: Physical Therapy

## 2021-03-24 DIAGNOSIS — M6281 Muscle weakness (generalized): Secondary | ICD-10-CM

## 2021-03-24 DIAGNOSIS — R2689 Other abnormalities of gait and mobility: Secondary | ICD-10-CM | POA: Diagnosis not present

## 2021-03-24 DIAGNOSIS — M25672 Stiffness of left ankle, not elsewhere classified: Secondary | ICD-10-CM

## 2021-03-24 DIAGNOSIS — R2681 Unsteadiness on feet: Secondary | ICD-10-CM

## 2021-03-24 NOTE — Therapy (Signed)
[facility] [address] [place], [place], [address] Phone: [phone]   Fax:  [phone]  Physical Therapy Treatment  Patient Details  Name: Miguel [name] MRN: [id number] Date of Birth: [birth year] Referring Provider (PT): Miguel [name]   Encounter Date: 03/24/2021   PT End of Session - 03/24/21 0724     Visit Number 6    Number of Visits 13    Date for PT Re-Evaluation 05/28/21    Authorization Type BCBS - VL 30 for PT/OT (will restart on  03/04/21)    Authorization - Visit Number 3    Authorization - Number of Visits 30    PT Start Time 0720    PT Stop Time 0800    PT Time Calculation (min) 40 min    Equipment Utilized During Treatment Gait belt    Activity Tolerance Patient tolerated treatment well    Behavior During Therapy Miguel [name] for tasks assessed/performed             Past Medical History:  Diagnosis Date   Cervical dystonia    Depression    Erectile dysfunction due to arterial insufficiency    Functional dyspepsia    Hypertension    Pancreatitis 2005   likely secondary to alcohol use   Rosacea     Past Surgical History:  Procedure Laterality Date   no past surgery     TIBIA IM NAIL INSERTION Left 02/07/2021   Procedure: INTRAMEDULLARY (IM) NAIL TIBIAL;  Surgeon: Altamese Eureka, MD;  Location: [facility];  Service: Orthopedics;  Laterality: Left;    There were no vitals filed for this visit.   Subjective Assessment - 03/24/21 0722     Subjective No new complaints. No falls. Some pain at left lateral aspect of ankle at times "comes and goes". None right now. Does still get some dizziness, mostly in the morning and with head turns.    Pertinent History [name]. history of cervical dystonia and receives Xeomin injections in the past followed by neurology service Miguel [name] at [facility], hypertension, depression, tobacco and alcohol use    How long can you walk comfortably? farthest walk  in [name] was 59' with RW.    Diagnostic tests MRI showed acute infarct left inferior posterior cerebellum.  Small acute infarct right thalamus.    Patient Stated Goals build up his legs and arms, walking again    Currently in Pain? No/denies    Pain Score 0-No pain                     [facility] Adult PT Treatment/Exercise - 03/24/21 0725       Transfers   Transfers Sit to Stand;Stand to Sit    Sit to Stand 4: Min guard    Stand to Sit 4: Min guard      Ambulation/Gait   Ambulation/Gait Yes    Ambulation/Gait Assistance 4: Min guard    Ambulation/Gait Assistance Details cues for walker position with gait and step placement with left LE.    Ambulation Distance (Feet) --   around clinic with session   Assistive device Rolling walker   left CAM boot   Gait Pattern Decreased weight shift to left;Step-to pattern    Ambulation Surface Level;Indoor    Gait Comments noted right ankle to roll out with stance. pt normally wear a boot to help with this. Also discussed use of an off shelf ASO for ankle support as well. Pt and  spouse shown one in session today. To concider it.      High Level Balance   High Level Balance Activities Side stepping;Marching forwards;Tandem walking   tandem fwd/bwd   High Level Balance Comments on blue mat in parallel bars with bil UE support to assist with WB status- 3-4 laps each with cues on form and technique. min guard assist for safety.      Knee/Hip Exercises: Aerobic   Other Aerobic Scifit LE/UE's level 5.0 x 8 minutes with goal >/= 70 steps per minute for strengthening and activity tolerance, ROM.      Knee/Hip Exercises: Supine   Straight Leg Raises Strengthening;AROM;Left;10 reps;Limitations;2 sets   5 sec hold   Straight Leg Raises Limitations using 2# ankle weight, pt able to perform with proper technique with visual muscle tremors as the leg fatigued.                 Balance Exercises - 03/24/21 0747       Balance Exercises:  Standing   Standing Eyes Closed Narrow base of support (BOS);Wide (BOA);Head turns;Foam/compliant surface;Other reps (comment);30 secs;Limitations    Standing Eyes Closed Limitations on airex with no UE support: feet together for EC 30 sec's x 3 reps, then with feet wider apart with light touch to bars for EC head movements left<>right, up<>down for ~10 reps each. Min to mod assist with increased postural sway noted. cues on posture and weight shifting to assist with balance.                 PT Short Term Goals - 03/24/21 0754       PT SHORT TERM GOAL #1   Title Pt will be independent with LLE strengthening and ROM HEP in order to build upon functional gains made in therapy. ALL STGS DUE 03/22/21    Baseline 03/24/21: met with current program    Status Achieved      PT SHORT TERM GOAL #2   Title Pt will improve gait speed with RW and appropriate WB precautions to at least .90 ft/sec in order to demo decr fall risk.    Baseline .16 ft/sec    Time 3    Period Weeks    Status New      PT SHORT TERM GOAL #3   Title Pt will perform sit <> stand transfers with appropriate WB precuations and supervision in order to demo improved functional transfers.    Baseline 03/24/21: met in session with progressing WBAT    Status Achieved      PT SHORT TERM GOAL #4   Title Pt will ambulate at least 230' with RW and appropriate WB precautions with supervision in order to demo improved household mobility.    Baseline 03/17/21: met distance with min guard assist on this session    Time --    Period --    Status Achieved               PT Long Term Goals - 03/01/21 2034       PT LONG TERM GOAL #1   Title Pt will improve gait speed with RW and appropriate WB precautions to at least 1.3 ft/sec in order to demo decr fall risk. ALL LTGS DUE 04/12/21    Baseline .41 ft/sec    Time 6    Period Weeks    Status New    Target Date 04/12/21      PT LONG TERM GOAL #2   Title Pt will  ambulate at  least 200' with RW and appropriate WB precautions outdoors on unlevel surfaces with supervision in order to demo improved community mobility.    Time 6    Period Weeks    Status New      PT LONG TERM GOAL #3   Title Pt will undergo TUG when appropriate in order to determine fall risk.    Time 6    Period Weeks    Status New      PT LONG TERM GOAL #4   Title Pt will improve FOTO score to 57 in order to demo improved functional outcomes.    Baseline 44    Time 6    Period Weeks    Status New      PT LONG TERM GOAL #5   Title --    Baseline --      PT LONG TERM GOAL #6   Title --    Baseline --    Time --    Period --    Status --                   Plan - 03/24/21 0725     Clinical Impression Statement Today's skilled session continued to focus on strengthening, gait training and balance working toward decreased UE support. No issues noted or reported in session. Pt has met most STGs check this session. Will check remaining STG at next session. The pt should benefit from continued PT to progress toward unmet goals.    Personal Factors and Comorbidities Comorbidity 3+;Past/Current Experience    Comorbidities Cervical dystonia, HTN, depression, anxiety.    Examination-Activity Limitations Stairs;Stand;Squat;Locomotion Level;Transfers;Bend    Examination-Participation Restrictions Community Activity;Driving;Yard Work;Occupation    Stability/Clinical Decision Making Evolving/Moderate complexity    Rehab Potential Good    PT Frequency Other (comment)   3x week for 1 week followed by 1-2x week for 5 weeks   PT Duration --   3x week for 1 week followed by 1-2x week for 5 weeks   PT Treatment/Interventions ADLs/Self Care Home Management;Stair training;Gait training;DME Instruction;Functional mobility training;Therapeutic activities;Therapeutic exercise;Neuromuscular re-education;Balance training;Patient/family education;Vestibular;Manual techniques;Passive range of motion     PT Next Visit Plan check last STG- gait speed. progressive WB status - beginning at 50%. gait with RW - working on gait endurance. standing balance adhering to precautions and add to HEP as appropriate. LLE strengthening/ROM, SciFit, work on core with air disc/pball.    PT Home Exercise Plan Access Code [email or web address]    Consulted and Agree with Plan of Care Patient             Patient will benefit from skilled therapeutic intervention in order to improve the following deficits and impairments:  Abnormal gait, Decreased coordination, Decreased balance, Decreased safety awareness, Decreased strength, Difficulty walking, Dizziness, Decreased activity tolerance  Visit Diagnosis: Other abnormalities of gait and mobility  Stiffness of left ankle, not elsewhere classified  Unsteadiness on feet  Muscle weakness (generalized)     Problem List Patient Active Problem List   Diagnosis Date Noted   Fall 02/05/2021   Fracture tibia/fibula, left, closed, initial encounter 02/05/2021   Cerebrovascular accident (CVA) of right thalamus ([facility]) 12/31/2020   Cerebral infarction due to embolism of left cerebellar artery ([place]) 12/31/2020   Alcohol use 12/19/2020   Acute CVA (cerebrovascular accident) ([place]) 12/18/2020   Depression with anxiety 12/18/2020   Essential hypertension 12/18/2020   Isolated cervical dystonia 12/06/2016    Willow Ora, PTA, CLT [facility] [address] [place], [place] [address] [phone] 03/24/21, 8:58 AM   Name: Miguel [name] MRN: [id number] Date of Birth: February 05, 1956

## 2021-03-25 DIAGNOSIS — E876 Hypokalemia: Secondary | ICD-10-CM | POA: Diagnosis not present

## 2021-03-25 DIAGNOSIS — E785 Hyperlipidemia, unspecified: Secondary | ICD-10-CM | POA: Diagnosis not present

## 2021-03-25 DIAGNOSIS — I639 Cerebral infarction, unspecified: Secondary | ICD-10-CM | POA: Diagnosis not present

## 2021-03-25 DIAGNOSIS — I951 Orthostatic hypotension: Secondary | ICD-10-CM | POA: Diagnosis not present

## 2021-03-25 DIAGNOSIS — E871 Hypo-osmolality and hyponatremia: Secondary | ICD-10-CM | POA: Diagnosis not present

## 2021-03-25 DIAGNOSIS — R269 Unspecified abnormalities of gait and mobility: Secondary | ICD-10-CM | POA: Diagnosis not present

## 2021-03-25 DIAGNOSIS — E878 Other disorders of electrolyte and fluid balance, not elsewhere classified: Secondary | ICD-10-CM | POA: Diagnosis not present

## 2021-03-26 ENCOUNTER — Other Ambulatory Visit: Payer: Self-pay

## 2021-03-26 ENCOUNTER — Ambulatory Visit: Payer: BC Managed Care – PPO | Admitting: Physical Therapy

## 2021-03-26 ENCOUNTER — Encounter: Payer: Self-pay | Admitting: Physical Therapy

## 2021-03-26 DIAGNOSIS — R2689 Other abnormalities of gait and mobility: Secondary | ICD-10-CM

## 2021-03-26 DIAGNOSIS — R2681 Unsteadiness on feet: Secondary | ICD-10-CM | POA: Diagnosis not present

## 2021-03-26 DIAGNOSIS — M6281 Muscle weakness (generalized): Secondary | ICD-10-CM | POA: Diagnosis not present

## 2021-03-26 DIAGNOSIS — M25672 Stiffness of left ankle, not elsewhere classified: Secondary | ICD-10-CM

## 2021-03-26 NOTE — Therapy (Signed)
[facility] [address] [place], [place], [address] Phone: [phone]   Fax:  [phone]  Physical Therapy Treatment  Patient Details  Name: Miguel [name] MRN: [id number] Date of Birth: 1956-07-16 Referring Provider (PT): Dr. Marcelino Scot   Encounter Date: 03/26/2021   PT End of Session - 03/26/21 0722     Visit Number 7    Number of Visits 13    Date for PT Re-Evaluation 05/28/21    Authorization Type BCBS - VL 30 for PT/OT (will restart on  03/04/21)    Authorization - Visit Number 4    Authorization - Number of Visits 30    PT Start Time 0719    PT Stop Time 0800    PT Time Calculation (min) 41 min    Equipment Utilized During Treatment Gait belt    Activity Tolerance Patient tolerated treatment well    Behavior During Therapy [facility] for tasks assessed/performed             Past Medical History:  Diagnosis Date   Cervical dystonia    Depression    Erectile dysfunction due to arterial insufficiency    Functional dyspepsia    Hypertension    Pancreatitis 2005   likely secondary to alcohol use   Rosacea     Past Surgical History:  Procedure Laterality Date   no past surgery     TIBIA IM NAIL INSERTION Left 02/07/2021   Procedure: INTRAMEDULLARY (IM) NAIL TIBIAL;  Surgeon: Altamese Maiden Rock, MD;  Location: [facility];  Service: Orthopedics;  Laterality: Left;    There were no vitals filed for this visit.   Subjective Assessment - 03/26/21 0720     Subjective No new complaints. No falls. Has new right ankle ASO on and has a couple more coming from [place] Did try his boot which supported the ankle however he did not have enough traction with it to move wheelchair. Did see Dr. Inda Merlin yesterday and he is going to start tapering the Mitrodrine as his BP in trending up now.    Pertinent History [name]. history of cervical dystonia and receives Xeomin injections in the past followed by neurology service Dr. Evelena Leyden at [facility], hypertension, depression, tobacco and alcohol use    How long can you walk comfortably? farthest walk in hospital was 14' with RW.    Diagnostic tests MRI showed acute infarct left inferior posterior cerebellum.  Small acute infarct right thalamus.    Patient Stated Goals build up his legs and arms, walking again    Currently in Pain? No/denies    Pain Score 0-No pain                     [facility] Adult PT Treatment/Exercise - 03/26/21 0722       Transfers   Transfers Sit to Stand;Stand to Sit    Sit to Stand 4: Min guard;With upper extremity assist;From bed;From chair/3-in-1    Stand to Sit 4: Min guard;With upper extremity assist;To bed;To chair/3-in-1      Ambulation/Gait   Ambulation/Gait Yes    Ambulation/Gait Assistance 4: Min guard    Ambulation/Gait Assistance Details cues to stay within walker, mostly with turns. pt with equal step length when on straight pathways.    Ambulation Distance (Feet) 100 Feet   x1, plus around clinic with session   Assistive device Rolling walker   left CAM boot   Gait Pattern Decreased weight shift to left;Step-to  pattern    Ambulation Surface Level;Indoor    Gait velocity 16.72 sec's= 1.96  ft/sec with RW      Knee/Hip Exercises: Aerobic   Other Aerobic Scifit LE/UE's level 5.5 x 8 minutes with goal >/= 70 steps per minute for strengthening and activity tolerance, ROM.                 Balance Exercises - 03/26/21 0730       Balance Exercises: Standing   Standing Eyes Closed Wide (BOA);Head turns;Foam/compliant surface;Other reps (comment);30 secs;Limitations    Standing Eyes Closed Limitations standing across blue foam beam with feet hip width apart, no UE support- EC 30 sec's x 3 reps, progressing to EC head movements left<>right, up<>down for ~10 reps each. resting with UE support on bars between reps/headmovements. min guard to min assist for balance.    Balance Beam standing across blue foam  beam with UE support on bars: alternating forward stepping to floor/back onto beam, then alternating backward stepping to floor/back onto beam for ~10 reps each with UE support on bars.    Tandem Gait Forward;Retro;Upper extremity support;Foam/compliant surface;4 reps;Limitations    Tandem Gait Limitations on blue foam beam for 4 laps each way with UE support, cues on step/foot placement. min guard to min assist for balance.    Sidestepping Foam/compliant support;Upper extremity support;4 reps;Limitations    Sidestepping Limitations on blue foam beam for 4 laps toward each side with UE support on bars. cues on posture/forward gaze and step height/placement. min guard assist for safety.    Sit to Stand Standard surface;Upper extremity support;Foam/compliant surface;Limitations    Sit to Stand Limitations seated with feet on blue airex: cues for tall posure with each stand and slow, controlled movements                 PT Short Term Goals - 03/26/21 0904       PT SHORT TERM GOAL #1   Title Pt will be independent with LLE strengthening and ROM HEP in order to build upon functional gains made in therapy. ALL STGS DUE 03/22/21    Baseline 03/24/21: met with current program    Status Achieved      PT SHORT TERM GOAL #2   Title Pt will improve gait speed with RW and appropriate WB precautions to at least .90 ft/sec in order to demo decr fall risk.    Baseline 03/26/21: 1.96 ft/sec with RW/CAM boot    Time --    Period --    Status Achieved      PT SHORT TERM GOAL #3   Title Pt will perform sit <> stand transfers with appropriate WB precuations and supervision in order to demo improved functional transfers.    Baseline 03/24/21: met in session with progressing WBAT    Status Achieved      PT SHORT TERM GOAL #4   Title Pt will ambulate at least 230' with RW and appropriate WB precautions with supervision in order to demo improved household mobility.    Baseline 03/17/21: met distance with  min guard assist on this session    Status Achieved               PT Long Term Goals - 03/01/21 2034       PT LONG TERM GOAL #1   Title Pt will improve gait speed with RW and appropriate WB precautions to at least 1.3 ft/sec in order to demo decr fall risk. ALL LTGS DUE  04/12/21    Baseline .68 ft/sec    Time 6    Period Weeks    Status New    Target Date 04/12/21      PT LONG TERM GOAL #2   Title Pt will ambulate at least 200' with RW and appropriate WB precautions outdoors on unlevel surfaces with supervision in order to demo improved community mobility.    Time 6    Period Weeks    Status New      PT LONG TERM GOAL #3   Title Pt will undergo TUG when appropriate in order to determine fall risk.    Time 6    Period Weeks    Status New      PT LONG TERM GOAL #4   Title Pt will improve FOTO score to 57 in order to demo improved functional outcomes.    Baseline 44    Time 6    Period Weeks    Status New      PT LONG TERM GOAL #5   Title --    Baseline --      PT LONG TERM GOAL #6   Title --    Baseline --    Time --    Period --    Status --                   Plan - 03/26/21 5093     Clinical Impression Statement Pt met his remaining STG this session with increased gait speed to 1.96 ft/sec with RW/CAM boot. Remainder of session continued to focus on strengthenning and balance training with progressive increase in weight bearing. No issues noted or reported in session. The pt is making progress and should benefit from continued PT to progress toward unmet goals.    Personal Factors and Comorbidities Comorbidity 3+;Past/Current Experience    Comorbidities Cervical dystonia, HTN, depression, anxiety.    Examination-Activity Limitations Stairs;Stand;Squat;Locomotion Level;Transfers;Bend    Examination-Participation Restrictions Community Activity;Driving;Yard Work;Occupation    Stability/Clinical Decision Making Evolving/Moderate complexity    Rehab  Potential Good    PT Frequency Other (comment)   3x week for 1 week followed by 1-2x week for 5 weeks   PT Duration --   3x week for 1 week followed by 1-2x week for 5 weeks   PT Treatment/Interventions ADLs/Self Care Home Management;Stair training;Gait training;DME Instruction;Functional mobility training;Therapeutic activities;Therapeutic exercise;Neuromuscular re-education;Balance training;Patient/family education;Vestibular;Manual techniques;Passive range of motion    PT Next Visit Plan continue to work on gait with RW (progressing to cane as able), LE and core strengthening and balance training on compliant surfaces    PT Home Exercise Plan Access Code [email or web address]    Consulted and Agree with Plan of Care Patient             Patient will benefit from skilled therapeutic intervention in order to improve the following deficits and impairments:  Abnormal gait, Decreased coordination, Decreased balance, Decreased safety awareness, Decreased strength, Difficulty walking, Dizziness, Decreased activity tolerance  Visit Diagnosis: Other abnormalities of gait and mobility  Stiffness of left ankle, not elsewhere classified  Unsteadiness on feet  Muscle weakness (generalized)     Problem List Patient Active Problem List   Diagnosis Date Noted   Fall 02/05/2021   Fracture tibia/fibula, left, closed, initial encounter 02/05/2021   Cerebrovascular accident (CVA) of right thalamus ([place]) 12/31/2020   Cerebral infarction due to embolism of left cerebellar artery ([place]) 12/31/2020   Alcohol use 12/19/2020   Acute CVA (cerebrovascular  accident) ([place]) 12/18/2020   Depression with anxiety 12/18/2020   Essential hypertension 12/18/2020   Isolated cervical dystonia 12/06/2016    Willow Ora, PTA, [facility] [address] [place], [place] [address] [phone] 03/26/21, 9:08 AM   Name: Miguel [name] MRN: [id number] Date of Birth: Feb 18, 1956

## 2021-03-29 ENCOUNTER — Other Ambulatory Visit: Payer: Self-pay

## 2021-03-29 ENCOUNTER — Ambulatory Visit: Payer: BC Managed Care – PPO | Admitting: Physical Therapy

## 2021-03-29 ENCOUNTER — Encounter: Payer: Self-pay | Admitting: Physical Therapy

## 2021-03-29 DIAGNOSIS — M6281 Muscle weakness (generalized): Secondary | ICD-10-CM

## 2021-03-29 DIAGNOSIS — R2681 Unsteadiness on feet: Secondary | ICD-10-CM | POA: Diagnosis not present

## 2021-03-29 DIAGNOSIS — R2689 Other abnormalities of gait and mobility: Secondary | ICD-10-CM | POA: Diagnosis not present

## 2021-03-29 DIAGNOSIS — M25672 Stiffness of left ankle, not elsewhere classified: Secondary | ICD-10-CM | POA: Diagnosis not present

## 2021-03-29 NOTE — Therapy (Addendum)
[facility] [address] [place], [place], [address] Phone: [phone]   Fax:  [phone]  Physical Therapy Treatment  Patient Details  Name: Kiev Labrosse MRN: [id number] Date of Birth: 08/17/1956 Referring Provider (PT): Dr. Marcelino Scot   Encounter Date: 03/29/2021   PT End of Session - 03/29/21 0846     Visit Number 8    Number of Visits 13    Date for PT Re-Evaluation 05/28/21    Authorization Type BCBS - VL 30 for PT/OT (will restart on  03/04/21)    Authorization - Visit Number 5    Authorization - Number of Visits 30    PT Start Time 0801    PT Stop Time 0845    PT Time Calculation (min) 44 min    Equipment Utilized During Treatment Gait belt    Activity Tolerance Patient tolerated treatment well    Behavior During Therapy [facility] for tasks assessed/performed             Past Medical History:  Diagnosis Date   Cervical dystonia    Depression    Erectile dysfunction due to arterial insufficiency    Functional dyspepsia    Hypertension    Pancreatitis 2005   likely secondary to alcohol use   Rosacea     Past Surgical History:  Procedure Laterality Date   no past surgery     TIBIA IM NAIL INSERTION Left 02/07/2021   Procedure: INTRAMEDULLARY (IM) NAIL TIBIAL;  Surgeon: Altamese Seaford, MD;  Location: [facility];  Service: Orthopedics;  Laterality: Left;    There were no vitals filed for this visit.   Subjective Assessment - 03/29/21 0804     Subjective Wearing new right ankle ASO today, feeling good on ankle. Sees Dr. Marcelino Scot on Wednesday. Took away the ramp at home and has been able to use the steps.    Pertinent History [name]. history of cervical dystonia and receives Xeomin injections in the past followed by neurology service Dr. Evelena Le[facility]den at [facility], hypertension, depression, tobacco and alcohol use    How long can you walk comfortably? farthest walk in hospital was 32' with RW.     Diagnostic tests MRI showed acute infarct left inferior posterior cerebellum.  Small acute infarct right thalamus.    Patient Stated Goals build up his legs and arms, walking again    Currently in Pain? No/denies                               [facility] Adult PT Treatment/Exercise - 03/29/21 0814       Transfers   Transfers Sit to Stand;Stand to Sit    Sit to Stand 4: Min guard;With upper extremity assist;From bed;From chair/3-in-1    Stand to Sit 4: Min guard;With upper extremity assist;To bed;To chair/3-in-1    Comments throughout session      Ambulation/Gait   Ambulation/Gait Yes    Ambulation/Gait Assistance 4: Min guard    Ambulation/Gait Assistance Details with RW cues to look ahead vs. at the ground, with one instance of looking ahead (from looking down)-pt stepping outside of BOS towards R, pt able to self correct back into BOS, needing min guard for balance/safety    Ambulation Distance (Feet) 345 Feet   x1   Assistive device Rolling walker   L CAM boot   Gait Pattern Decreased weight shift to left;Step-to pattern    Ambulation Surface  Level;Indoor      Knee/Hip Exercises: Aerobic   Other Aerobic Scifit LE/UE's level 5.7 x 7 minutes with goal at 65-70 steps per minute for strengthening and activity tolerance, ROM.                 Balance Exercises - 03/29/21 0836       Balance Exercises: Standing   Standing Eyes Opened Foam/compliant surface    Standing Eyes Opened Limitations on blue air ex: 2 x 10 reps head turns, 2 x 10 reps head nods, using bars intermittently for balance    Standing Eyes Closed Foam/compliant surface    Standing Eyes Closed Limitations on blue air ex: feet hip width distance eyes closed 3 x 30 seconds. then performed at edge of mat with RW: eyes closed on level ground with wider BOS  x10 reps head turns, x10 reps head nods - added to HEP at home for pt to perform with spouse next to him, both pt and pt's spouse verbalized  understanding    Balance Beam --    Retro Gait Upper extremity support;3 reps;Limitations    Retro Gait Limitations down and back x3 reps in // bars, cues for wider BOS, use of BUE support    Sidestepping Foam/compliant support;Upper extremity support;4 reps;Limitations    Sidestepping Limitations on blue foam beam for 2 laps toward each side with UE support on bars. cues on posture/forward gaze and step height/placement. min guard assist for safety. performed an additional 2 laps down and back with head nods    Other Standing Exercises in // bars with BUE support: forward gait down and back x3 reps with head nods, an additional rep performed with head turns                 PT Short Term Goals - 03/26/21 0904       PT SHORT TERM GOAL #1   Title Pt will be independent with LLE strengthening and ROM HEP in order to build upon functional gains made in therapy. ALL STGS DUE 03/22/21    Baseline 03/24/21: met with current program    Status Achieved      PT SHORT TERM GOAL #2   Title Pt will improve gait speed with RW and appropriate WB precautions to at least .90 ft/sec in order to demo decr fall risk.    Baseline 03/26/21: 1.96 ft/sec with RW/CAM boot    Time --    Period --    Status Achieved      PT SHORT TERM GOAL #3   Title Pt will perform sit <> stand transfers with appropriate WB precuations and supervision in order to demo improved functional transfers.    Baseline 03/24/21: met in session with progressing WBAT    Status Achieved      PT SHORT TERM GOAL #4   Title Pt will ambulate at least 230' with RW and appropriate WB precautions with supervision in order to demo improved household mobility.    Baseline 03/17/21: met distance with min guard assist on this session    Status Achieved               PT Long Term Goals - 03/01/21 2034       PT LONG TERM GOAL #1   Title Pt will improve gait speed with RW and appropriate WB precautions to at least 1.3 ft/sec in order to  demo decr fall risk. ALL LTGS DUE 04/12/21    Baseline .68 ft/sec  Time 6    Period Weeks    Status New    Target Date 04/12/21      PT LONG TERM GOAL #2   Title Pt will ambulate at least 200' with RW and appropriate WB precautions outdoors on unlevel surfaces with supervision in order to demo improved community mobility.    Time 6    Period Weeks    Status New      PT LONG TERM GOAL #3   Title Pt will undergo TUG when appropriate in order to determine fall risk.    Time 6    Period Weeks    Status New      PT LONG TERM GOAL #4   Title Pt will improve FOTO score to 57 in order to demo improved functional outcomes.    Baseline 44    Time 6    Period Weeks    Status New      PT LONG TERM GOAL #5   Title --    Baseline --      PT LONG TERM GOAL #6   Title --    Baseline --    Time --    Period --    Status --                   Plan - 03/29/21 1229     Clinical Impression Statement Today's skilled session focused on strengthening and balance training. Pt with incr difficulty with balance with head motions (esp nods) on compliant surfaces. Added standing balance at RW with eyes closed and head motions to pt's HEP on level surfaces for pt to perform with wife's supervision. Will continue to progress towards LTGs.    Personal Factors and Comorbidities Comorbidity 3+;Past/Current Experience    Comorbidities Cervical dystonia, HTN, depression, anxiety.    Examination-Activity Limitations Stairs;Stand;Squat;Locomotion Level;Transfers;Bend    Examination-Participation Restrictions Community Activity;Driving;Yard Work;Occupation    Stability/Clinical Decision Making Evolving/Moderate complexity    Rehab Potential Good    PT Frequency Other (comment)   3x week for 1 week followed by 1-2x week for 5 weeks   PT Duration --   3x week for 1 week followed by 1-2x week for 5 weeks   PT Treatment/Interventions ADLs/Self Care Home Management;Stair training;Gait training;DME  Instruction;Functional mobility training;Therapeutic activities;Therapeutic exercise;Neuromuscular re-education;Balance training;Patient/family education;Vestibular;Manual techniques;Passive range of motion    PT Next Visit Plan what is the update from Dr. Marcelino Scot? continue to work on gait with RW (progressing to cane as able), LE and core strengthening and balance training on compliant surfaces    PT Home Exercise Plan Access Code [place] and Agree with Plan of Care Patient             Patient will benefit from skilled therapeutic intervention in order to improve the following deficits and impairments:  Abnormal gait, Decreased coordination, Decreased balance, Decreased safety awareness, Decreased strength, Difficulty walking, Dizziness, Decreased activity tolerance  Visit Diagnosis: Other abnormalities of gait and mobility  Unsteadiness on feet  Muscle weakness (generalized)     Problem List Patient Active Problem List   Diagnosis Date Noted   Fall 02/05/2021   Fracture tibia/fibula, left, closed, initial encounter 02/05/2021   Cerebrovascular accident (CVA) of right thalamus ([place]) 12/31/2020   Cerebral infarction due to embolism of left cerebellar artery ([place]) 12/31/2020   Alcohol use 12/19/2020   Acute CVA (cerebrovascular accident) ([place]) 12/18/2020   Depression with anxiety 12/18/2020   Essential hypertension 12/18/2020  Isolated cervical dystonia 12/06/2016    Arliss Journey ,PT, DPT  03/29/2021, 12:31 PM  [facility] [address] [place], [place], [address] Phone: [phone]   Fax:  [phone]  Name: Paddy Neis MRN: [id number] Date of Birth: 04-Apr-1956

## 2021-03-31 DIAGNOSIS — S82242D Displaced spiral fracture of shaft of left tibia, subsequent encounter for closed fracture with routine healing: Secondary | ICD-10-CM | POA: Diagnosis not present

## 2021-03-31 DIAGNOSIS — S82465D Nondisplaced segmental fracture of shaft of left fibula, subsequent encounter for closed fracture with routine healing: Secondary | ICD-10-CM | POA: Diagnosis not present

## 2021-04-02 ENCOUNTER — Other Ambulatory Visit: Payer: Self-pay

## 2021-04-02 ENCOUNTER — Ambulatory Visit: Payer: BC Managed Care – PPO | Admitting: Physical Therapy

## 2021-04-02 DIAGNOSIS — R2681 Unsteadiness on feet: Secondary | ICD-10-CM

## 2021-04-02 DIAGNOSIS — M25672 Stiffness of left ankle, not elsewhere classified: Secondary | ICD-10-CM

## 2021-04-02 DIAGNOSIS — R2689 Other abnormalities of gait and mobility: Secondary | ICD-10-CM | POA: Diagnosis not present

## 2021-04-02 DIAGNOSIS — M6281 Muscle weakness (generalized): Secondary | ICD-10-CM | POA: Diagnosis not present

## 2021-04-02 NOTE — Therapy (Signed)
[facility] [address] [place], [place], [address] Phone: [phone]   Fax:  [phone]  Physical Therapy Treatment  Patient Details  Name: Miguel [name] MRN: [id number] Date of Birth: 06/27/1956 Referring Provider (PT): Dr. Carola Frost   Encounter Date: 04/02/2021   PT End of Session - 04/02/21 1221     Visit Number 9    Number of Visits 13    Date for PT Re-Evaluation 05/28/21    Authorization Type BCBS - VL 30 for PT/OT (will restart on  03/04/21)    Authorization - Visit Number 6    Authorization - Number of Visits 30    PT Start Time 0802    PT Stop Time 0845    PT Time Calculation (min) 43 min    Equipment Utilized During Treatment Gait belt    Activity Tolerance Patient tolerated treatment well    Behavior During Therapy [facility] for tasks assessed/performed             Past Medical History:  Diagnosis Date   Cervical dystonia    Depression    Erectile dysfunction due to arterial insufficiency    Functional dyspepsia    Hypertension    Pancreatitis 2005   likely secondary to alcohol use   Rosacea     Past Surgical History:  Procedure Laterality Date   no past surgery     TIBIA IM NAIL INSERTION Left 02/07/2021   Procedure: INTRAMEDULLARY (IM) NAIL TIBIAL;  Surgeon: Myrene Galas, MD;  Location: [facility] OR;  Service: Orthopedics;  Laterality: Left;    There were no vitals filed for this visit.   Subjective Assessment - 04/02/21 0806     Subjective Saw Dr. Carola Frost 2 days ago - now [facility]! (brought in prescription and has been faxed). Wean from the walker and the boot. Wearing boot when leg feels a little sore.    Pertinent History [name]. history of cervical dystonia and receives Xeomin injections in the past followed by neurology service Dr. Boris Sharper at [facility], hypertension, depression, tobacco and alcohol use    How long can you walk comfortably? farthest walk in hospital was 36' with  RW.    Diagnostic tests MRI showed acute infarct left inferior posterior cerebellum.  Small acute infarct right thalamus.    Patient Stated Goals build up his legs and arms, walking again    Currently in Pain? No/denies                [facility] PT Assessment - 04/02/21 1219       Precautions   Precaution Comments per Dr. Carola Frost: WBAT LLE, wean from walker/CAM boot as able                           [facility] Adult PT Treatment/Exercise - 04/02/21 1223       Transfers   Transfers Sit to Stand;Stand to Sit    Sit to Stand 5: Supervision;With upper extremity assist;4: Min guard    Stand to Sit 5: Supervision      Ambulation/Gait   Ambulation/Gait Yes    Ambulation/Gait Assistance 4: Min guard    Ambulation/Gait Assistance Details with LLE WBAT and no CAM boot, cues to stay inside RW during turns and to look ahead during gait    Ambulation Distance (Feet) 345 Feet    Assistive device Rolling walker    Gait Pattern Decreased weight shift to left;Step-through pattern;Ataxic  Ambulation Surface Level;Indoor      Exercises   Exercises Other Exercises    Other Exercises  for L ankle ROM: verbally reviewed ankle alphabets for pt to be performing at home, seated L ankle inversion with foot on towel (to pt's tolerance) 2 x 10 reps, performed seated L calf stretch with strap 3 x 30 seconds, with cues for hold time and technique                 Balance Exercises - 04/02/21 0832       Balance Exercises: Standing   Standing Eyes Opened Foam/compliant surface    Standing Eyes Opened Limitations narrow BOS 2 x 30 seconds, feet hip width 2 x 10 reps head turns, 2 x 10 reps head nods - intermittent taps to bar for balance   Standing Eyes Closed Foam/compliant surface    Standing Eyes Closed Limitations on blue air ex: feet hip width 2 x 30 seconds, x10 reps head nods and x10 reps head turns with fingeritp support    Rockerboard Anterior/posterior;EO;Limitations     Rockerboard Limitations shfting weight forwards and backwards - beginning with UE support for incr ankle ROM and then progressing to none x15 reps total.  Keeping board still; alternating UE lifts x3 reps B and then reaching and looking head up at hand x4 reps B with min A for balance, reaching across body to tap target x6 reps B with min A for balance   Retro Gait Upper extremity support;3 reps;Limitations    Retro Gait Limitations with single UE support cues for step length and wider BOS    Other Standing Exercises on blue air ex:2 x10 reps mini squats               PT Education - 04/02/21 1220     Education Details scheduling future appts, importance for wife to provide supervision when pt performs stairs at home for incr safety as pt has been going up and down on his own, ankle inversion ROM with towel and seated calf stretch with strap additions to HEP    Person(s) Educated Patient;Spouse    Methods Explanation;Demonstration    Comprehension Verbalized understanding;Returned demonstration              PT Short Term Goals - 03/26/21 0904       PT SHORT TERM GOAL #1   Title Pt will be independent with LLE strengthening and ROM HEP in order to build upon functional gains made in therapy. ALL STGS DUE 03/22/21    Baseline 03/24/21: met with current program    Status Achieved      PT SHORT TERM GOAL #2   Title Pt will improve gait speed with RW and appropriate WB precautions to at least .90 ft/sec in order to demo decr fall risk.    Baseline 03/26/21: 1.96 ft/sec with RW/CAM boot    Time --    Period --    Status Achieved      PT SHORT TERM GOAL #3   Title Pt will perform sit <> stand transfers with appropriate WB precuations and supervision in order to demo improved functional transfers.    Baseline 03/24/21: met in session with progressing WBAT    Status Achieved      PT SHORT TERM GOAL #4   Title Pt will ambulate at least 230' with RW and appropriate WB precautions with  supervision in order to demo improved household mobility.    Baseline 03/17/21: met distance  with min guard assist on this session    Status Achieved               PT Long Term Goals - 03/01/21 2034       PT LONG TERM GOAL #1   Title Pt will improve gait speed with RW and appropriate WB precautions to at least 1.3 ft/sec in order to demo decr fall risk. ALL LTGS DUE 04/12/21    Baseline .68 ft/sec    Time 6    Period Weeks    Status New    Target Date 04/12/21      PT LONG TERM GOAL #2   Title Pt will ambulate at least 200' with RW and appropriate WB precautions outdoors on unlevel surfaces with supervision in order to demo improved community mobility.    Time 6    Period Weeks    Status New      PT LONG TERM GOAL #3   Title Pt will undergo TUG when appropriate in order to determine fall risk.    Time 6    Period Weeks    Status New      PT LONG TERM GOAL #4   Title Pt will improve FOTO score to 57 in order to demo improved functional outcomes.    Baseline 44    Time 6    Period Weeks    Status New      PT LONG TERM GOAL #5   Title --    Baseline --      PT LONG TERM GOAL #6   Title --    Baseline --    Time --    Period --    Status --                   Plan - 04/02/21 1232     Clinical Impression Statement Pt with updated weight bearing status from Dr. Marcelino Scot - now WBAT LLE and weaning from CAM boot. Added ankle ROM stretches/exercies to pt's HEP for L ankle DF and inversion. Remainder of session focused on balance strategies on compliant surfaces with head motions and vision removed. Pt tolerated session well, will continue to progress towards LTGs    Personal Factors and Comorbidities Comorbidity 3+;Past/Current Experience    Comorbidities Cervical dystonia, HTN, depression, anxiety.    Examination-Activity Limitations Stairs;Stand;Squat;Locomotion Level;Transfers;Bend    Examination-Participation Restrictions Community Activity;Driving;Yard  Work;Occupation    Stability/Clinical Decision Making Evolving/Moderate complexity    Rehab Potential Good    PT Frequency Other (comment)   3x week for 1 week followed by 1-2x week for 5 weeks   PT Duration --   3x week for 1 week followed by 1-2x week for 5 weeks   PT Treatment/Interventions ADLs/Self Care Home Management;Stair training;Gait training;DME Instruction;Functional mobility training;Therapeutic activities;Therapeutic exercise;Neuromuscular re-education;Balance training;Patient/family education;Vestibular;Manual techniques;Passive range of motion    PT Next Visit Plan CHECK GOALS and i will have to perform re-cert this week. perform 5x sit <> stand. continue to work on gait with RW (progressing to cane as able), LE and core strengthening and balance training on compliant surfaces. L ankle ROM    PT Home Exercise Plan Access Code [email or web address]    Consulted and Agree with Plan of Care Patient             Patient will benefit from skilled therapeutic intervention in order to improve the following deficits and impairments:  Abnormal gait, Decreased coordination, Decreased balance, Decreased safety awareness, Decreased strength,  Difficulty walking, Dizziness, Decreased activity tolerance  Visit Diagnosis: Other abnormalities of gait and mobility  Unsteadiness on feet  Muscle weakness (generalized)  Stiffness of left ankle, not elsewhere classified     Problem List Patient Active Problem List   Diagnosis Date Noted   Fall 02/05/2021   Fracture tibia/fibula, left, closed, initial encounter 02/05/2021   Cerebrovascular accident (CVA) of right thalamus ([place]) 12/31/2020   Cerebral infarction due to embolism of left cerebellar artery ([place]) 12/31/2020   Alcohol use 12/19/2020   Acute CVA (cerebrovascular accident) ([place]) 12/18/2020   Depression with anxiety 12/18/2020   Essential hypertension 12/18/2020   Isolated cervical dystonia 12/06/2016    Arliss Journey, PT, DPT   04/02/2021, 1:27 PM  [facility] [address] [place], [place], [address] Phone: [phone]   Fax:  [phone]  Name: Miguel [name] MRN: [id number] Date of Birth: 24-May-1956

## 2021-04-06 ENCOUNTER — Ambulatory Visit: Payer: BC Managed Care – PPO | Attending: Physician Assistant | Admitting: Physical Therapy

## 2021-04-06 ENCOUNTER — Other Ambulatory Visit: Payer: Self-pay

## 2021-04-06 ENCOUNTER — Encounter: Payer: Self-pay | Admitting: Physical Therapy

## 2021-04-06 DIAGNOSIS — R278 Other lack of coordination: Secondary | ICD-10-CM | POA: Diagnosis not present

## 2021-04-06 DIAGNOSIS — R42 Dizziness and giddiness: Secondary | ICD-10-CM | POA: Insufficient documentation

## 2021-04-06 DIAGNOSIS — R29818 Other symptoms and signs involving the nervous system: Secondary | ICD-10-CM | POA: Diagnosis not present

## 2021-04-06 DIAGNOSIS — M25672 Stiffness of left ankle, not elsewhere classified: Secondary | ICD-10-CM | POA: Diagnosis not present

## 2021-04-06 DIAGNOSIS — R260 Ataxic gait: Secondary | ICD-10-CM | POA: Insufficient documentation

## 2021-04-06 DIAGNOSIS — R2681 Unsteadiness on feet: Secondary | ICD-10-CM

## 2021-04-06 DIAGNOSIS — M6281 Muscle weakness (generalized): Secondary | ICD-10-CM | POA: Diagnosis not present

## 2021-04-06 DIAGNOSIS — R2689 Other abnormalities of gait and mobility: Secondary | ICD-10-CM

## 2021-04-06 DIAGNOSIS — R270 Ataxia, unspecified: Secondary | ICD-10-CM | POA: Diagnosis not present

## 2021-04-06 NOTE — Therapy (Addendum)
Miguel [name] [address] [place], [place], [address] Phone: [phone]   Fax:  [phone]  Physical Therapy Treatment/Re-Cert  Patient Details  Name: Miguel [name] MRN: [id number] Date of Birth: [birth year] Referring Provider (PT): Dr. Marcelino Scot   Encounter Date: 04/06/2021   04/06/21 1541  PT Visits / Re-Eval  Visit Number 10  Number of Visits 23  Date for PT Re-Evaluation 98/33/82 (per recert on 5/0/53)  Authorization  Authorization Type BCBS - VL 30 for PT/OT (will restart on  03/04/21)  Authorization - Visit Number 7  Authorization - Number of Visits 30  PT Time Calculation  PT Start Time 9767  PT Stop Time 3419  PT Time Calculation (min) 40 min  PT - End of Session  Equipment Utilized During Treatment Gait belt  Activity Tolerance Patient tolerated treatment well  Behavior During Therapy [facility] for tasks assessed/performed     Past Medical History:  Diagnosis Date   Cervical dystonia    Depression    Erectile dysfunction due to arterial insufficiency    Functional dyspepsia    Hypertension    Pancreatitis 2005   likely secondary to alcohol use   Rosacea     Past Surgical History:  Procedure Laterality Date   no past surgery     TIBIA IM NAIL INSERTION Left 02/07/2021   Procedure: INTRAMEDULLARY (IM) NAIL TIBIAL;  Surgeon: Altamese Grantfork, MD;  Location: [place];  Service: Orthopedics;  Laterality: Left;    There were no vitals filed for this visit.   Subjective Assessment - 04/06/21 1536     Subjective No new copmplaint. No falls to report. Still with episodes of dizziness, mostly in the morning. Most days it improves, however today still having mild dizziness right now. Spouse reports he did a lot of his eye ex's prior to coming here today so this may be a contributing factor. Does reports his left leg hurts today, feels he may have overdone his ex's at home.    Pertinent History [name]. history of  cervical dystonia and receives Xeomin injections in the past followed by neurology service Dr. Evelena Leyden at [facility], hypertension, depression, tobacco and alcohol use    How long can you walk comfortably? farthest walk in hospital was 45' with RW.    Diagnostic tests MRI showed acute infarct left inferior posterior cerebellum.  Small acute infarct right thalamus.    Patient Stated Goals build up his legs and arms, walking again    Currently in Pain? Yes    Pain Score 3     Pain Location Leg    Pain Orientation Left    Pain Descriptors / Indicators Sore;Tender    Pain Type Acute pain;Surgical pain    Pain Onset More than a month ago    Pain Frequency Constant    Aggravating Factors  recent fracture, weight bearing    Pain Relieving Factors rest some. does not take medication. Discussed use of ice and elevation. Pt to try this.               04/06/21 1542  Assessment  Medical Diagnosis CVA/ s/p L tib/fib fx  Onset Date/Surgical Date 02/05/21  Precautions  Precautions Fall  Prior Function  Level of Independence Independent  Transfers  Transfers Sit to Stand;Stand to Sit  Sit to Stand 5: Supervision;With upper extremity assist;4: Min guard  Stand to Sit 5: Supervision;With upper extremity assist;To bed;To chair/3-in-1  Ambulation/Gait  Ambulation/Gait Yes  Ambulation/Gait  Assistance 4: Min guard  Ambulation Distance (Feet) 210 Feet (x1 indoors only, 210 x1 in/outdoors)  Assistive device Rolling walker;Straight cane  Gait Pattern Decreased weight shift to left;Step-through pattern;Ataxic  Ambulation Surface Level;Indoor;Unlevel;Outdoor;Paved  Gait velocity 14.44 sec's= 2.71 ft/sec with RW  Timed Up and Go Test  Normal TUG (seconds) 21.63 (with RW; 16.28 sec's with straight cane, min guard to min assist)  TUG Normal TUG     [facility] PT Assessment - 04/06/21 1542       Timed Up and Go Test   TUG Normal TUG    Normal TUG (seconds) 21.63   with RW; 16.28 sec's  with straight cane, min guard to min assist                  [facility] Adult PT Treatment/Exercise - 04/06/21 1542       Transfers   Transfers Sit to Stand;Stand to Sit    Sit to Stand 5: Supervision;With upper extremity assist;4: Min guard    Stand to Sit 5: Supervision;With upper extremity assist;To bed;To chair/3-in-1      Ambulation/Gait   Ambulation/Gait Yes    Ambulation/Gait Assistance 4: Min guard    Ambulation Distance (Feet) 210 Feet   x1 indoors only, 210 x1 in/outdoors   Assistive device Rolling walker;Straight cane    Gait Pattern Decreased weight shift to left;Step-through pattern;Ataxic    Ambulation Surface Level;Indoor;Unlevel;Outdoor;Paved    Gait velocity 14.44 sec's= 2.71 ft/sec with RW      Berg Balance Test   Sit to Stand Able to stand without using hands and stabilize independently    Standing Unsupported Able to stand safely 2 minutes    Sitting with Back Unsupported but Feet Supported on Floor or Stool Able to sit safely and securely 2 minutes    Stand to Sit Sits safely with minimal use of hands    Transfers Able to transfer safely, definite need of hands    Standing Unsupported with Eyes Closed Able to stand 10 seconds with supervision    Standing Ubsupported with Feet Together Able to place feet together independently and stand for 1 minute with supervision    From Standing, Reach Forward with Outstretched Arm Can reach confidently >25 cm (10")   8 inches   From Standing Position, Pick up Object from Floor Able to pick up shoe, needs supervision    From Standing Position, Turn to Look Behind Over each Shoulder Turn sideways only but maintains balance    Turn 360 Degrees Able to turn 360 degrees safely but slowly   > 8 sec's, min guard for safety due to gets dizzy with certain movements, no assist needed   Standing Unsupported, Alternately Place Feet on Step/Stool Able to complete >2 steps/needs minimal assist    Standing Unsupported, One Foot in Front  Able to plae foot ahead of the other independently and hold 30 seconds    Standing on One Leg Able to lift leg independently and hold equal to or more than 3 seconds    Total Score 42                  PT Short Term Goals - 03/26/21 0904       PT SHORT TERM GOAL #1   Title Pt will be independent with LLE strengthening and ROM HEP in order to build upon functional gains made in therapy. ALL STGS DUE 03/22/21    Baseline 03/24/21: met with current program    Status Achieved  PT SHORT TERM GOAL #2   Title Pt will improve gait speed with RW and appropriate WB precautions to at least .90 ft/sec in order to demo decr fall risk.    Baseline 03/26/21: 1.96 ft/sec with RW/CAM boot    Time --    Period --    Status Achieved      PT SHORT TERM GOAL #3   Title Pt will perform sit <> stand transfers with appropriate WB precuations and supervision in order to demo improved functional transfers.    Baseline 03/24/21: met in session with progressing WBAT    Status Achieved      PT SHORT TERM GOAL #4   Title Pt will ambulate at least 230' with RW and appropriate WB precautions with supervision in order to demo improved household mobility.    Baseline 03/17/21: met distance with min guard assist on this session    Status Achieved            Revised STGs for recert:   PT Short Term Goals - 04/13/21 0758       PT SHORT TERM GOAL #1   Title Pt will perform TUG with SPC in 16 seconds or less with supervision/min guard in order to demo decr fall risk. ALL STGS DUE 05/04/21    Baseline 16.28 seconds with min guard/min A    Time 3    Period Weeks    Status New    Target Date 05/04/21      PT SHORT TERM GOAL #2   Title Pt will undergo further gait speed assessment with cane with LTG written.    Baseline 2.71 ft/sec with RW    Time 3    Period Weeks    Status New      PT SHORT TERM GOAL #3   Title Pt will ambulate at least 230' over indoor level surfaces with cane with  supervision/min guard in order to demo improved household mobility.    Baseline --    Time 3    Period Weeks    Status New      PT SHORT TERM GOAL #4   Title Pt will improve BERG score to at least a 46/56 in order to demo decr fall risk.    Baseline 42/56    Time 3    Period Weeks    Status New               PT Long Term Goals - 04/06/21 1621       PT LONG TERM GOAL #1   Title Pt will improve gait speed with RW and appropriate WB precautions to at least 1.3 ft/sec in order to demo decr fall risk. ALL LTGS DUE 04/12/21    Baseline 04/06/21: 2.71 ft/sec with RW    Status Achieved      PT LONG TERM GOAL #2   Title Pt will ambulate at least 200' with RW and appropriate WB precautions outdoors on unlevel surfaces with supervision in order to demo improved community mobility.    Baseline 04/06/21: met in session with RW and left LE WBAT    Status Achieved      PT LONG TERM GOAL #3   Title Pt will undergo TUG when appropriate in order to determine fall risk.    Baseline 04/06/21: 21.63 sec's with RW with min guard assist, 16.28 sec's with cane with up to min assist    Status Achieved      PT LONG TERM GOAL #  4   Title Pt will improve FOTO score to 57 in order to demo improved functional outcomes.    Baseline 44    Status On-going            Revised LTGs for recert:      PT Long Term Goals - 04/13/21 0801       PT LONG TERM GOAL #1   Title Gait speed goal to be written with SPC in order to demo decr fall risk. ALL LTGS DUE 05/25/21    Baseline not yet assessed    Time 6    Period Weeks    Status New    Target Date 05/25/21      PT LONG TERM GOAL #2   Title Pt will ambulate at least 200' with LRAD over outdoor unlevel surfaces with supervision in order to demo improved community mobility.    Baseline using RW for outdoor gait    Time 6    Period Weeks    Status New      PT LONG TERM GOAL #3   Title Pt will perform TUG with SPC with supervision in 15 seconds or  lesss in order to demo decr fall risk.    Baseline 04/06/21: 21.63 sec's with RW with min guard assist, 16.28 sec's with cane with up to min assist    Time 6    Period Weeks    Status New      PT LONG TERM GOAL #4   Title Pt will improve FOTO score to 57 in order to demo improved functional outcomes.    Baseline 44    Time 6    Period Weeks    Status On-going      PT LONG TERM GOAL #5   Title Pt will improve BERG score to at least a 50/56 in order to demo decr fall risk.    Baseline 42/56    Time 6    Period Weeks    Status New                 04/06/21 1541  Plan  Clinical Impression Statement Today's skilled session focused on progress toward LTGs with pt showing progress toward goals. Pt improved the Berg Balance test score to 42/56. Pt's TUG scores are 21.63 sec's with RW and 16.28 sec's with cane with increased assist for balance with cane. Pt's 10 meter gait speed improved to 2.71 ft/sec as well. Pt met all gait distance goals as well. Primary PT plans to update goals for recert. The pt should benefit from continued PT to progress back to use of cane vs no AD as strength and balance improve. From primary PT - will re-cert for an additional 12 visits to continue to address balance, strength, fall prevention, gait training, ROM, in order to decr pt's risk of falls and improve functional mobility/independence. STGs and LTGs updated as appropriate.  Personal Factors and Comorbidities Comorbidity 3+;Past/Current Experience  Comorbidities Cervical dystonia, HTN, depression, anxiety.  Examination-Activity Limitations Stairs;Stand;Squat;Locomotion Level;Transfers;Bend  Examination-Participation Restrictions Community Activity;Driving;Yard Work;Occupation  Pt will benefit from skilled therapeutic intervention in order to improve on the following deficits Abnormal gait;Decreased coordination;Decreased balance;Decreased safety awareness;Decreased strength;Difficulty  walking;Dizziness;Decreased activity tolerance  Stability/Clinical Decision Making Evolving/Moderate complexity  Rehab Potential Good  PT Frequency 2x / week (12 visits over 8 weeks)  PT Duration 8 weeks (12 visits over 8 weeks)  PT Treatment/Interventions ADLs/Self Care Home Management;Stair training;Gait training;DME Instruction;Functional mobility training;Therapeutic activities;Therapeutic exercise;Neuromuscular re-education;Balance training;Patient/family education;Vestibular;Manual techniques;Passive  range of motion  PT Next Visit Plan [place] PT to recert. Perform 5x sit <> stand. continue to work on gait with RW (progressing to cane as able), LE and core strengthening and balance training on compliant surfaces. L ankle ROM  PT Home Exercise Plan Access Code [email or web address]  Consulted and Agree with Plan of Care Patient   Patient will benefit from skilled therapeutic intervention in order to improve the following deficits and impairments:  Abnormal gait, Decreased coordination, Decreased balance, Decreased safety awareness, Decreased strength, Difficulty walking, Dizziness, Decreased activity tolerance  Visit Diagnosis: Other abnormalities of gait and mobility  Unsteadiness on feet  Muscle weakness (generalized)  Stiffness of left ankle, not elsewhere classified     Problem List Patient Active Problem List   Diagnosis Date Noted   Fall 02/05/2021   Fracture tibia/fibula, left, closed, initial encounter 02/05/2021   Cerebrovascular accident (CVA) of right thalamus ([place]) 12/31/2020   Cerebral infarction due to embolism of left cerebellar artery ([place]) 12/31/2020   Alcohol use 12/19/2020   Acute CVA (cerebrovascular accident) ([place]) 12/18/2020   Depression with anxiety 12/18/2020   Essential hypertension 12/18/2020   Isolated cervical dystonia 12/06/2016    Willow Ora, PTA, [facility] [address] [place], [place]  [address] [phone] 04/06/21, 4:34 PM   Name: Deniro Laymon MRN: [id number] Date of Birth: 10/08/1955   Janann August, PT, DPT 04/13/21 7:51 AM

## 2021-04-08 ENCOUNTER — Ambulatory Visit: Payer: BC Managed Care – PPO | Admitting: Physical Therapy

## 2021-04-08 ENCOUNTER — Other Ambulatory Visit: Payer: Self-pay

## 2021-04-08 ENCOUNTER — Encounter: Payer: Self-pay | Admitting: Physical Therapy

## 2021-04-08 DIAGNOSIS — M25672 Stiffness of left ankle, not elsewhere classified: Secondary | ICD-10-CM

## 2021-04-08 DIAGNOSIS — R270 Ataxia, unspecified: Secondary | ICD-10-CM | POA: Diagnosis not present

## 2021-04-08 DIAGNOSIS — R260 Ataxic gait: Secondary | ICD-10-CM | POA: Diagnosis not present

## 2021-04-08 DIAGNOSIS — M6281 Muscle weakness (generalized): Secondary | ICD-10-CM | POA: Diagnosis not present

## 2021-04-08 DIAGNOSIS — R42 Dizziness and giddiness: Secondary | ICD-10-CM | POA: Diagnosis not present

## 2021-04-08 DIAGNOSIS — R278 Other lack of coordination: Secondary | ICD-10-CM | POA: Diagnosis not present

## 2021-04-08 DIAGNOSIS — R2689 Other abnormalities of gait and mobility: Secondary | ICD-10-CM

## 2021-04-08 DIAGNOSIS — R2681 Unsteadiness on feet: Secondary | ICD-10-CM

## 2021-04-08 DIAGNOSIS — R29818 Other symptoms and signs involving the nervous system: Secondary | ICD-10-CM | POA: Diagnosis not present

## 2021-04-08 NOTE — Therapy (Signed)
[facility] [address] [place], [place], [address] Phone: [phone]   Fax:  [phone]  Physical Therapy Treatment  Patient Details  Name: Miguel [name] MRN: [id number] Date of Birth: 10/10/1955 Referring Provider (PT): Dr. Marcelino Scot   Encounter Date: 04/08/2021   PT End of Session - 04/08/21 0807     Visit Number 11    Number of Visits 13    Date for PT Re-Evaluation 05/28/21    Authorization Type BCBS - VL 30 for PT/OT (will restart on  03/04/21)    Authorization - Visit Number 8    Authorization - Number of Visits 30    PT Start Time 0804    PT Stop Time 0845    PT Time Calculation (min) 41 min    Equipment Utilized During Treatment Gait belt    Activity Tolerance Patient tolerated treatment well    Behavior During Therapy [facility] for tasks assessed/performed             Past Medical History:  Diagnosis Date   Cervical dystonia    Depression    Erectile dysfunction due to arterial insufficiency    Functional dyspepsia    Hypertension    Pancreatitis 2005   likely secondary to alcohol use   Rosacea     Past Surgical History:  Procedure Laterality Date   no past surgery     TIBIA IM NAIL INSERTION Left 02/07/2021   Procedure: INTRAMEDULLARY (IM) NAIL TIBIAL;  Surgeon: Altamese Montverde, MD;  Location: [facility];  Service: Orthopedics;  Laterality: Left;    There were no vitals filed for this visit.   Subjective Assessment - 04/08/21 0806     Subjective No new complaints. No falls or pain to report.    Pertinent History [name]. history of cervical dystonia and receives Xeomin injections in the past followed by neurology service Dr. Evelena Leyden at [facility], hypertension, depression, tobacco and alcohol use    How long can you walk comfortably? farthest walk in hospital was 69' with RW.    Diagnostic tests MRI showed acute infarct left inferior posterior cerebellum.  Small acute infarct right  thalamus.    Patient Stated Goals build up his legs and arms, walking again    Currently in Pain? Yes    Pain Score 3     Pain Location Ankle    Pain Orientation Left    Pain Descriptors / Indicators Sore;Tender    Pain Type Acute pain;Surgical pain    Pain Onset More than a month ago    Pain Frequency Constant    Aggravating Factors  recent fracture, weight bearing    Pain Relieving Factors rest does some,                  [facility] Adult PT Treatment/Exercise - 04/08/21 0808       Transfers   Transfers Sit to Stand;Stand to Sit    Sit to Stand 5: Supervision;With upper extremity assist;4: Min guard    Stand to Sit 5: Supervision;With upper extremity assist;To bed;To chair/3-in-1      Ambulation/Gait   Ambulation/Gait Yes    Ambulation/Gait Assistance 4: Min guard    Ambulation Distance (Feet) --   around clinic with session   Assistive device Rolling walker;Straight cane    Gait Pattern Decreased weight shift to left;Step-through pattern;Ataxic    Ambulation Surface Level;Indoor      High Level Balance   High Level Balance Activities Side  stepping;Marching forwards;Marching backwards    High Level Balance Comments on blue mat in parallel bars with no UE support for side stepping and single UE uspport for marching- 3-4 laps each with cues on form and technique. min guard assist for safety.      Knee/Hip Exercises: Aerobic   Other Aerobic Scifit LE/UE's level 6.0 x 8 minutes with goal at 65-70 steps per minute for strengthening and activity tolerance, ROM.                 Balance Exercises - 04/08/21 0811       Balance Exercises: Standing   SLS with Vectors Foam/compliant surface;Upper extremity assist 2;Other reps (comment)    SLS with Vectors Limitations on balance board in anterior/posterior direction with 2 tall cones on floor in front of board, light touch to bars working on alternating forward, then cross foot taps for 10 reps each. cues for base of  support, weight shifting and hip/knee flexion needed. min guard to min assist.    Rockerboard Anterior/posterior;Lateral;EO;EC;Other time (comment);Other reps (comment);Limitations    Rockerboard Limitations performed both ways on balance board with no UE support: rocking the board with emphasis on tall posture with EO, progressing to EC. then holding the board steady for EC 30 sec's x 3 reps. min guard to min assist with increased assistance needed with vision removed. pt with posterior balance loss perference.                 PT Short Term Goals - 03/26/21 0904       PT SHORT TERM GOAL #1   Title Pt will be independent with LLE strengthening and ROM HEP in order to build upon functional gains made in therapy. ALL STGS DUE 03/22/21    Baseline 03/24/21: met with current program    Status Achieved      PT SHORT TERM GOAL #2   Title Pt will improve gait speed with RW and appropriate WB precautions to at least .90 ft/sec in order to demo decr fall risk.    Baseline 03/26/21: 1.96 ft/sec with RW/CAM boot    Time --    Period --    Status Achieved      PT SHORT TERM GOAL #3   Title Pt will perform sit <> stand transfers with appropriate WB precuations and supervision in order to demo improved functional transfers.    Baseline 03/24/21: met in session with progressing WBAT    Status Achieved      PT SHORT TERM GOAL #4   Title Pt will ambulate at least 230' with RW and appropriate WB precautions with supervision in order to demo improved household mobility.    Baseline 03/17/21: met distance with min guard assist on this session    Status Achieved               PT Long Term Goals - 04/06/21 1621       PT LONG TERM GOAL #1   Title Pt will improve gait speed with RW and appropriate WB precautions to at least 1.3 ft/sec in order to demo decr fall risk. ALL LTGS DUE 04/12/21    Baseline 04/06/21: 2.71 ft/sec with RW    Status Achieved      PT LONG TERM GOAL #2   Title Pt will  ambulate at least 200' with RW and appropriate WB precautions outdoors on unlevel surfaces with supervision in order to demo improved community mobility.    Baseline 04/06/21: met in  session with RW and left LE WBAT    Status Achieved      PT LONG TERM GOAL #3   Title Pt will undergo TUG when appropriate in order to determine fall risk.    Baseline 04/06/21: 21.63 sec's with RW with min guard assist, 16.28 sec's with cane with up to min assist    Status Achieved      PT LONG TERM GOAL #4   Title Pt will improve FOTO score to 57 in order to demo improved functional outcomes.    Baseline 44    Status On-going                   Plan - 04/08/21 0807     Clinical Impression Statement Today's skilled session continued to focus on strengthening and balance training. Mild increase in ankle/foot pain to 3-4/10 after session. No other issues noted or reported in session. The pt is making steady progress and should benefit from continued PT to progress toward unmet goals.    Personal Factors and Comorbidities Comorbidity 3+;Past/Current Experience    Comorbidities Cervical dystonia, HTN, depression, anxiety.    Examination-Activity Limitations Stairs;Stand;Squat;Locomotion Level;Transfers;Bend    Examination-Participation Restrictions Community Activity;Driving;Yard Work;Occupation    Stability/Clinical Decision Making Evolving/Moderate complexity    Rehab Potential Good    PT Frequency Other (comment)   3x week for 1 week followed by 1-2x week for 5 weeks   PT Duration --   3x week for 1 week followed by 1-2x week for 5 weeks   PT Treatment/Interventions ADLs/Self Care Home Management;Stair training;Gait training;DME Instruction;Functional mobility training;Therapeutic activities;Therapeutic exercise;Neuromuscular re-education;Balance training;Patient/family education;Vestibular;Manual techniques;Passive range of motion    PT Next Visit Plan Perform 5x sit <> stand. continue to work on gait  with RW (progressing to cane as able), LE and core strengthening and balance training on compliant surfaces. L ankle ROM    PT Home Exercise Plan Access Code [email or web address]    Consulted and Agree with Plan of Care Patient             Patient will benefit from skilled therapeutic intervention in order to improve the following deficits and impairments:  Abnormal gait, Decreased coordination, Decreased balance, Decreased safety awareness, Decreased strength, Difficulty walking, Dizziness, Decreased activity tolerance  Visit Diagnosis: Other abnormalities of gait and mobility  Unsteadiness on feet  Muscle weakness (generalized)  Stiffness of left ankle, not elsewhere classified     Problem List Patient Active Problem List   Diagnosis Date Noted   Fall 02/05/2021   Fracture tibia/fibula, left, closed, initial encounter 02/05/2021   Cerebrovascular accident (CVA) of right thalamus ([place]) 12/31/2020   Cerebral infarction due to embolism of left cerebellar artery ([place]) 12/31/2020   Alcohol use 12/19/2020   Acute CVA (cerebrovascular accident) ([place]) 12/18/2020   Depression with anxiety 12/18/2020   Essential hypertension 12/18/2020   Isolated cervical dystonia 12/06/2016    Willow Ora, PTA, [facility] Outpatient Neuro [facility] [address], [place], [place] [address] [phone] 04/08/21, 3:06 PM   Name: Lawrance Wiedemann MRN: [id number] Date of Birth: 1956-05-30

## 2021-04-10 DIAGNOSIS — S82202A Unspecified fracture of shaft of left tibia, initial encounter for closed fracture: Secondary | ICD-10-CM | POA: Diagnosis not present

## 2021-04-13 ENCOUNTER — Ambulatory Visit: Payer: BC Managed Care – PPO | Admitting: Physical Therapy

## 2021-04-13 ENCOUNTER — Other Ambulatory Visit: Payer: Self-pay

## 2021-04-13 DIAGNOSIS — R42 Dizziness and giddiness: Secondary | ICD-10-CM

## 2021-04-13 DIAGNOSIS — R2689 Other abnormalities of gait and mobility: Secondary | ICD-10-CM

## 2021-04-13 DIAGNOSIS — R270 Ataxia, unspecified: Secondary | ICD-10-CM | POA: Diagnosis not present

## 2021-04-13 DIAGNOSIS — R278 Other lack of coordination: Secondary | ICD-10-CM | POA: Diagnosis not present

## 2021-04-13 DIAGNOSIS — M25672 Stiffness of left ankle, not elsewhere classified: Secondary | ICD-10-CM | POA: Diagnosis not present

## 2021-04-13 DIAGNOSIS — R2681 Unsteadiness on feet: Secondary | ICD-10-CM

## 2021-04-13 DIAGNOSIS — R29818 Other symptoms and signs involving the nervous system: Secondary | ICD-10-CM | POA: Diagnosis not present

## 2021-04-13 DIAGNOSIS — M6281 Muscle weakness (generalized): Secondary | ICD-10-CM | POA: Diagnosis not present

## 2021-04-13 DIAGNOSIS — R260 Ataxic gait: Secondary | ICD-10-CM | POA: Diagnosis not present

## 2021-04-13 NOTE — Therapy (Addendum)
[place] [place] [address] [place], [place], [address] Phone: [phone]   Fax:  [phone]  Physical Therapy Treatment  Patient Details  Name: Miguel [name] MRN: [id number] Date of Birth: [birth year] Referring Provider (PT): Dr. Marcelino Scot   Encounter Date: 04/13/2021   PT End of Session - 04/13/21 1414     Visit Number 12    Number of Visits 23    Date for PT Re-Evaluation [id number]   per recert on [id number]   Authorization Type BCBS - VL 30 for PT/OT (will restart on  03/04/21)    Authorization - Visit Number 9    Authorization - Number of Visits 30    PT Start Time [id number]    PT Stop Time 1445    PT Time Calculation (min) 40 min    Equipment Utilized During Treatment Gait belt    Activity Tolerance Patient tolerated treatment well    Behavior During Therapy WFL for tasks assessed/performed             Past Medical History:  Diagnosis Date   Cervical dystonia    Depression    Erectile dysfunction due to arterial insufficiency    Functional dyspepsia    Hypertension    Pancreatitis 2005   likely secondary to alcohol use   Rosacea     Past Surgical History:  Procedure Laterality Date   no past surgery     TIBIA IM NAIL INSERTION Left 02/07/2021   Procedure: INTRAMEDULLARY (IM) NAIL TIBIAL;  Surgeon: Altamese [place], MD;  Location: [facility];  Service: Orthopedics;  Laterality: Left;    There were no vitals filed for this visit.   Subjective Assessment - 04/13/21 1411     Subjective No new complaints. No falls. Has been getting dizzy more so that usual with vestibular/eye ex's at home. Asking about having the crystals checked.    Pertinent History [name]. history of cervical dystonia and receives Xeomin injections in the past followed by neurology service Dr. Evelena Leyden at [facility], hypertension, depression, tobacco and alcohol use    How long can you walk comfortably? farthest walk in hospital was 82'  with RW.    Diagnostic tests MRI showed acute infarct left inferior posterior cerebellum.  Small acute infarct right thalamus.    Patient Stated Goals build up his legs and arms, walking again    Currently in Pain? No/denies    Pain Score 0-No pain    Pain Location Ankle                     Vestibular Assessment - 04/13/21 1422       Vestibular Assessment   General Observation Note that without UE support pt tends to lean toward left      Symptom Behavior   Subjective history of current problem feels okay when doing the VOR/eye/X1 ex's, then gets wose when he stops and moves. Does have  frontal headache at times. Also gets dizziness when getting up from standing, turning in standing (not in sitting or lying down), when he first lies down<>sitting up from lying down and bending over. Feels it's about the same since the stroke.    Type of Dizziness  Comment   "out of my space"; disoriented   Frequency of Dizziness comes and goes.    Duration of Dizziness 30 sec's up to 3-4 minutes    Symptom Nature Motion provoked;Intermittent    Aggravating Factors Lying supine;Mornings;Turning body quickly;Turning  head quickly;Sitting in moving car;Supine to sit;Sit to stand;Forward bending    Progression of Symptoms Better      Oculomotor Exam   Oculomotor Alignment Normal    Ocular ROM normal    Spontaneous Absent    Gaze-induced  Absent    Smooth Pursuits Intact    Saccades Intact      Vestibulo-Ocular Reflex   VOR to Slow Head Movement Normal    VOR Cancellation Corrective saccades      Positional Testing   Dix-Hallpike Dix-Hallpike Right;Dix-Hallpike Left    Sidelying Test Sidelying Right;Sidelying Left    Horizontal Canal Testing Horizontal Canal Right;Horizontal Canal Left      Dix-Hallpike Right   Dix-Hallpike Right Duration minutes    Dix-Hallpike Right Symptoms Downbeat, left rotatory nystagmus      Dix-Hallpike Left   Dix-Hallpike Left Duration 0    Dix-Hallpike  Left Symptoms No nystagmus      Sidelying Left   Sidelying Left Duration --   with head looking down toward left   Sidelying Left Symptoms No nystagmus      Horizontal Canal Right   Horizontal Canal Right Duration 0    Horizontal Canal Right Symptoms Normal      Horizontal Canal Left   Horizontal Canal Left Duration 0    Horizontal Canal Left Symptoms Normal      Positional Sensitivities   Sit to Supine No dizziness    Supine to Left Side No dizziness    Supine to Right Side No dizziness    Supine to Sitting No dizziness    Right Hallpike Moderate dizziness    Up from Right Hallpike Mild dizziness    Up from Left Hallpike No dizziness            [name], PT, DPT 04/14/21    12:27 PM    [facility] Adult PT Treatment/Exercise - 04/13/21 1416       Transfers   Transfers Sit to Stand;Stand to Sit    Sit to Stand 5: Supervision;With upper extremity assist;4: Min guard    Stand to Sit 5: Supervision;With upper extremity assist;To bed;To chair/3-in-1      Ambulation/Gait   Ambulation/Gait Yes    Ambulation/Gait Assistance 4: Min guard    Ambulation/Gait Assistance Details min guard assist for safety with gait. pt with dizziness after walking away from stairs after 2cd rep  was able to stand statically for a few seconds to allow it to resolve then returned to mat table after dizziness resolved. PT available and performed vestibular eval for remainder of session.    Ambulation Distance (Feet) 20 Feet   x1 with cane, into/out of session with RW   Assistive device Rolling walker;Straight cane    Gait Pattern Decreased weight shift to left;Step-through pattern;Ataxic    Ambulation Surface Level;Indoor    Stairs Yes    Stairs Assistance 4: Min guard    Stairs Assistance Details (indicate cue type and reason) cues on hand placement on rail (to slide it along for improved weight shifting) adn sequencing with using cane/rail combo. no balance issues noted, however pt did have  brieft episode of dizziness with turning on floor after 1st rep that resolved. none after descending the stairs on the 2cd rep, however it did occur with walking back to the mat with pt needing to stop to allow it to resolve.    Stair Management Technique One rail Right;Alternating pattern;Forwards;With cane    Number of Stairs 4  x2 reps   Height of Stairs 6                      PT Short Term Goals - 04/13/21 0758       PT SHORT TERM GOAL #1   Title Pt will perform TUG with SPC in 16 seconds or less with supervision/min guard in order to demo decr fall risk. ALL STGS DUE 05/04/21    Baseline 16.28 seconds with min guard/min A    Time 3    Period Weeks    Status New    Target Date 05/04/21      PT SHORT TERM GOAL #2   Title Pt will undergo further gait speed assessment with cane with LTG written.    Baseline 2.71 ft/sec with RW    Time 3    Period Weeks    Status New      PT SHORT TERM GOAL #3   Title Pt will ambulate at least 230' over indoor level surfaces with cane with supervision/min guard in order to demo improved household mobility.    Baseline --    Time 3    Period Weeks    Status New      PT SHORT TERM GOAL #4   Title Pt will improve BERG score to at least a 46/56 in order to demo decr fall risk.    Baseline 42/56    Time 3    Period Weeks    Status New               PT Long Term Goals - 04/13/21 0801       PT LONG TERM GOAL #1   Title Gait speed goal to be written with SPC in order to demo decr fall risk. ALL LTGS DUE 05/25/21    Baseline not yet assessed    Time 6    Period Weeks    Status New    Target Date 05/25/21      PT LONG TERM GOAL #2   Title Pt will ambulate at least 200' with LRAD over outdoor unlevel surfaces with supervision in order to demo improved community mobility.    Baseline using RW for outdoor gait    Time 6    Period Weeks    Status New      PT LONG TERM GOAL #3   Title Pt will perform TUG with SPC with  supervision in 15 seconds or lesss in order to demo decr fall risk.    Baseline 04/06/21: 21.63 sec's with RW with min guard assist, 16.28 sec's with cane with up to min assist    Time 6    Period Weeks    Status New      PT LONG TERM GOAL #4   Title Pt will improve FOTO score to 57 in order to demo improved functional outcomes.    Baseline 44    Time 6    Period Weeks    Status On-going      PT LONG TERM GOAL #5   Title Pt will improve BERG score to at least a 50/56 in order to demo decr fall risk.    Baseline 42/56    Time 6    Period Weeks    Status New                   Plan - 04/13/21 1414     Clinical Impression Statement Today's skilled session initially  focused on gait and stairs with cane. Pt with increased episodes of dizziness this session with certain movements and reporting this as the case at home as well. Vestibular PT available and willing to assess pt to rule out positional vs central as the cause. Pt's reponses to testing was inconclusive this session on major cause of pt's dizziness. PT plans to continue assessment at pt's next session.    Personal Factors and Comorbidities Comorbidity 3+;Past/Current Experience    Comorbidities Cervical dystonia, HTN, depression, anxiety.    Examination-Activity Limitations Stairs;Stand;Squat;Locomotion Level;Transfers;Bend    Examination-Participation Restrictions Community Activity;Driving;Yard Work;Occupation    Stability/Clinical Decision Making Evolving/Moderate complexity    Rehab Potential Good    PT Frequency Other (comment)   3x week for 1 week followed by 1-2x week for 5 weeks   PT Duration --   3x week for 1 week followed by 1-2x week for 5 weeks   PT Treatment/Interventions ADLs/Self Care Home Management;Stair training;Gait training;DME Instruction;Functional mobility training;Therapeutic activities;Therapeutic exercise;Neuromuscular re-education;Balance training;Patient/family education;Vestibular;Manual  techniques;Passive range of motion    PT Next Visit Plan pt to see vestiular PT next session for continued assessment. continue to work on gait with RW (progressing to cane as able), LE and core strengthening and balance training on compliant surfaces. L ankle ROM    PT Home Exercise Plan Access Code [email or web address]    Consulted and Agree with Plan of Care Patient             Patient will benefit from skilled therapeutic intervention in order to improve the following deficits and impairments:  Abnormal gait, Decreased coordination, Decreased balance, Decreased safety awareness, Decreased strength, Difficulty walking, Dizziness, Decreased activity tolerance  Visit Diagnosis: Other abnormalities of gait and mobility  Unsteadiness on feet  Dizziness and giddiness     Problem List Patient Active Problem List   Diagnosis Date Noted   Fall 02/05/2021   Fracture tibia/fibula, left, closed, initial encounter 02/05/2021   Cerebrovascular accident (CVA) of right thalamus ([place]) 12/31/2020   Cerebral infarction due to embolism of left cerebellar artery ([place]) 12/31/2020   Alcohol use 12/19/2020   Acute CVA (cerebrovascular accident) ([place]) 12/18/2020   Depression with anxiety 12/18/2020   Essential hypertension 12/18/2020   Isolated cervical dystonia 12/06/2016    Willow Ora, PTA, [facility] Outpatient Neuro [facility] [address] [place], [place] [address] [phone] 04/13/21, 3:18 PM   Name: Oconnor Erker MRN: [id number] Date of Birth: 1956-02-04

## 2021-04-13 NOTE — Addendum Note (Signed)
Addended by: Arliss Journey on: 04/13/2021 08:05 AM   Modules accepted: Orders

## 2021-04-14 DIAGNOSIS — H04123 Dry eye syndrome of bilateral lacrimal glands: Secondary | ICD-10-CM | POA: Diagnosis not present

## 2021-04-14 DIAGNOSIS — H5212 Myopia, left eye: Secondary | ICD-10-CM | POA: Diagnosis not present

## 2021-04-14 DIAGNOSIS — H2513 Age-related nuclear cataract, bilateral: Secondary | ICD-10-CM | POA: Diagnosis not present

## 2021-04-14 DIAGNOSIS — I679 Cerebrovascular disease, unspecified: Secondary | ICD-10-CM | POA: Diagnosis not present

## 2021-04-16 ENCOUNTER — Other Ambulatory Visit: Payer: Self-pay

## 2021-04-16 ENCOUNTER — Ambulatory Visit: Payer: BC Managed Care – PPO | Admitting: Physical Therapy

## 2021-04-16 DIAGNOSIS — R2681 Unsteadiness on feet: Secondary | ICD-10-CM | POA: Diagnosis not present

## 2021-04-16 DIAGNOSIS — R42 Dizziness and giddiness: Secondary | ICD-10-CM

## 2021-04-16 DIAGNOSIS — R278 Other lack of coordination: Secondary | ICD-10-CM

## 2021-04-16 DIAGNOSIS — R270 Ataxia, unspecified: Secondary | ICD-10-CM | POA: Diagnosis not present

## 2021-04-16 DIAGNOSIS — R2689 Other abnormalities of gait and mobility: Secondary | ICD-10-CM

## 2021-04-16 DIAGNOSIS — M6281 Muscle weakness (generalized): Secondary | ICD-10-CM | POA: Diagnosis not present

## 2021-04-16 DIAGNOSIS — R260 Ataxic gait: Secondary | ICD-10-CM | POA: Diagnosis not present

## 2021-04-16 DIAGNOSIS — R29818 Other symptoms and signs involving the nervous system: Secondary | ICD-10-CM | POA: Diagnosis not present

## 2021-04-16 DIAGNOSIS — M25672 Stiffness of left ankle, not elsewhere classified: Secondary | ICD-10-CM | POA: Diagnosis not present

## 2021-04-16 NOTE — Therapy (Signed)
[facility] [address] [place], [place], [address] Phone: [phone]   Fax:  [phone]  Physical Therapy Treatment  Patient Details  Name: Miguel [name] MRN: [id number] Date of Birth: 08-Nov-1955 Referring Provider (PT): Dr. Marcelino Scot   Encounter Date: 04/16/2021   PT End of Session - 04/16/21 0719     Visit Number 13    Number of Visits 23    Date for PT Re-Evaluation [id number]   per recert on [id number]   Authorization Type BCBS - VL 30 for PT/OT (will restart on  03/04/21)    Authorization - Visit Number 10    Authorization - Number of Visits 30    PT Start Time 0717    PT Stop Time 0800    PT Time Calculation (min) 43 min    Equipment Utilized During Treatment Gait belt    Activity Tolerance Patient tolerated treatment well    Behavior During Therapy [facility] for tasks assessed/performed             Past Medical History:  Diagnosis Date   Cervical dystonia    Depression    Erectile dysfunction due to arterial insufficiency    Functional dyspepsia    Hypertension    Pancreatitis 2005   likely secondary to alcohol use   Rosacea     Past Surgical History:  Procedure Laterality Date   no past surgery     TIBIA IM NAIL INSERTION Left 02/07/2021   Procedure: INTRAMEDULLARY (IM) NAIL TIBIAL;  Surgeon: Altamese , MD;  Location: [facility];  Service: Orthopedics;  Laterality: Left;    There were no vitals filed for this visit.   Subjective Assessment - 04/16/21 0720     Subjective Pt reports waking up yesterday very dizzy, lasted about 3-4 hours.  Was able to get up and move around and it started calming down.  BP was WNL when he checked at home.  No dizziness this morning.  No pain in the neck.    Pertinent History Miguel [name]. history of cervical dystonia and receives Xeomin injections in the past followed by neurology service Dr. Evelena Leyden at [facility], hypertension, depression, tobacco and alcohol use     How long can you walk comfortably? farthest walk in hospital was 54' with RW.    Diagnostic tests MRI showed acute infarct left inferior posterior cerebellum.  Small acute infarct right thalamus.    Patient Stated Goals build up his legs and arms, walking again    Currently in Pain? No/denies                     Vestibular Assessment - 04/16/21 0722       Vestibular Assessment   General Observation Went to eye doctor on Wednesday; will be getting bifocals.  Right now has trifocal contact lenses which shift when he has nystagmus.      Oculomotor Exam   Spontaneous Right beating nystagmus    Gaze-induced  Right beating nystagmus with R gaze;Right beating nystagmus with L gaze    Smooth Pursuits Comment    Comment with head in midline no spontaneous or gaze holding nystagmus observed; when head rotated to L pt experiences spontaneous nystagmus R beating and gaze holding nystagmus.  More intense when head turned to the R.      Other Tests   Comments Neck Torsion test: Moving body on head seated on stool: noted spontaneous nystagmus to the R with body turned to  R and L.  Joint Position Error testing L and R: R to middle 2.0 deg of error to the R; L to middle 6.0 deg of error to the L      Positional Sensitivities   Nose to Right Knee No dizziness    Right Knee to Sitting No dizziness    Nose to Left Knee No dizziness    Left Knee to Sitting No dizziness    Head Turning x 5 No dizziness    Head Nodding x 5 No dizziness    Pivot Right in Standing No dizziness    Pivot Left in Standing No dizziness                  [facility] Adult PT Treatment/Exercise - 04/16/21 0759       Neuro Re-ed    Neuro Re-ed Details  Seated on mat with L hip wedged to shift weight to R and reaching RUE over to the R to keep trunk and head in midline: performed head rotation to R and keeping head rotated performed vertical head movements up and down x 4 reps x 2 sets.  Then performed sit >  stand from mat focusing on keeping laser in middle of rolling wall x 3 reps with supervision-min A.                    PT Education - 04/16/21 1012     Education Details clinical findings, changes to PT POC and focus of treatment session in order to address    Person(s) Educated Patient;Spouse    Methods Explanation    Comprehension Verbalized understanding              PT Short Term Goals - 04/13/21 0758       PT SHORT TERM GOAL #1   Title Pt will perform TUG with SPC in 16 seconds or less with supervision/min guard in order to demo decr fall risk. ALL STGS DUE 05/04/21    Baseline 16.28 seconds with min guard/min A    Time 3    Period Weeks    Status New    Target Date 05/04/21      PT SHORT TERM GOAL #2   Title Pt will undergo further gait speed assessment with cane with LTG written.    Baseline 2.71 ft/sec with RW    Time 3    Period Weeks    Status New      PT SHORT TERM GOAL #3   Title Pt will ambulate at least 230' over indoor level surfaces with cane with supervision/min guard in order to demo improved household mobility.    Baseline --    Time 3    Period Weeks    Status New      PT SHORT TERM GOAL #4   Title Pt will improve BERG score to at least a 46/56 in order to demo decr fall risk.    Baseline 42/56    Time 3    Period Weeks    Status New               PT Long Term Goals - 04/13/21 0801       PT LONG TERM GOAL #1   Title Gait speed goal to be written with SPC in order to demo decr fall risk. ALL LTGS DUE 05/25/21    Baseline not yet assessed    Time 6    Period Weeks    Status  New    Target Date 05/25/21      PT LONG TERM GOAL #2   Title Pt will ambulate at least 200' with LRAD over outdoor unlevel surfaces with supervision in order to demo improved community mobility.    Baseline using RW for outdoor gait    Time 6    Period Weeks    Status New      PT LONG TERM GOAL #3   Title Pt will perform TUG with SPC with  supervision in 15 seconds or lesss in order to demo decr fall risk.    Baseline 04/06/21: 21.63 sec's with RW with min guard assist, 16.28 sec's with cane with up to min assist    Time 6    Period Weeks    Status New      PT LONG TERM GOAL #4   Title Pt will improve FOTO score to 57 in order to demo improved functional outcomes.    Baseline 44    Time 6    Period Weeks    Status On-going      PT LONG TERM GOAL #5   Title Pt will improve BERG score to at least a 50/56 in order to demo decr fall risk.    Baseline 42/56    Time 6    Period Weeks    Status New                   Plan - 04/16/21 1008     Clinical Impression Statement Continued and completed vestibular assessment.  Pt's signs and symptoms do not appear to be consistent with BPPV and appear to be related to cervical position, history of torticollis, and of central origin.  Pt also presents with impaired visual vertical, impaired head and trunk righting, and impaired cervical joint proprioception.  Will begin to incorporate cervical proprioception and reorientation to midline training into treatment sessions.    Personal Factors and Comorbidities Comorbidity 3+;Past/Current Experience    Comorbidities Cervical dystonia, HTN, depression, anxiety.    Examination-Activity Limitations Stairs;Stand;Squat;Locomotion Level;Transfers;Bend    Examination-Participation Restrictions Community Activity;Driving;Yard Work;Occupation    Stability/Clinical Decision Making Evolving/Moderate complexity    Rehab Potential Good    PT Frequency Other (comment)   3x week for 1 week followed by 1-2x week for 5 weeks   PT Duration --   3x week for 1 week followed by 1-2x week for 5 weeks   PT Treatment/Interventions ADLs/Self Care Home Management;Stair training;Gait training;DME Instruction;Functional mobility training;Therapeutic activities;Therapeutic exercise;Neuromuscular re-education;Balance training;Patient/family  education;Vestibular;Manual techniques;Passive range of motion    PT Next Visit Plan Using laser - work on neck proprioception, reorientation to midline begin in sitting > standing > walking.  Incorporate head movements and visual scanning. continue to work on gait with RW (progressing to cane as able), LE and core strengthening and balance training on compliant surfaces. L ankle ROM    PT Home Exercise Plan Access Code [id number]    Consulted and Agree with Plan of Care Patient             Patient will benefit from skilled therapeutic intervention in order to improve the following deficits and impairments:  Abnormal gait, Decreased coordination, Decreased balance, Decreased safety awareness, Decreased strength, Difficulty walking, Dizziness, Decreased activity tolerance  Visit Diagnosis: Other abnormalities of gait and mobility  Unsteadiness on feet  Dizziness and giddiness  Ataxia  Ataxic gait  Other lack of coordination     Problem List Patient Active Problem List  Diagnosis Date Noted   Fall 02/05/2021   Fracture tibia/fibula, left, closed, initial encounter 02/05/2021   Cerebrovascular accident (CVA) of right thalamus ([facility]) 12/31/2020   Cerebral infarction due to embolism of left cerebellar artery ([place]) 12/31/2020   Alcohol use 12/19/2020   Acute CVA (cerebrovascular accident) ([place]) 12/18/2020   Depression with anxiety 12/18/2020   Essential hypertension 12/18/2020   Isolated cervical dystonia 12/06/2016    [name], PT, DPT 04/16/21    10:13 AM    [facility] [address] [place], [place], [address] Phone: [phone]   Fax:  [phone]  Name: Stanwood Yassine MRN: [id number] Date of Birth: [birth year]

## 2021-04-20 ENCOUNTER — Ambulatory Visit: Payer: BC Managed Care – PPO | Admitting: Physical Therapy

## 2021-04-20 ENCOUNTER — Encounter: Payer: Self-pay | Admitting: Physical Therapy

## 2021-04-20 ENCOUNTER — Other Ambulatory Visit: Payer: Self-pay

## 2021-04-20 DIAGNOSIS — R2681 Unsteadiness on feet: Secondary | ICD-10-CM | POA: Diagnosis not present

## 2021-04-20 DIAGNOSIS — R42 Dizziness and giddiness: Secondary | ICD-10-CM

## 2021-04-20 DIAGNOSIS — M6281 Muscle weakness (generalized): Secondary | ICD-10-CM | POA: Diagnosis not present

## 2021-04-20 DIAGNOSIS — R260 Ataxic gait: Secondary | ICD-10-CM | POA: Diagnosis not present

## 2021-04-20 DIAGNOSIS — R270 Ataxia, unspecified: Secondary | ICD-10-CM | POA: Diagnosis not present

## 2021-04-20 DIAGNOSIS — R29818 Other symptoms and signs involving the nervous system: Secondary | ICD-10-CM | POA: Diagnosis not present

## 2021-04-20 DIAGNOSIS — R278 Other lack of coordination: Secondary | ICD-10-CM | POA: Diagnosis not present

## 2021-04-20 DIAGNOSIS — M25672 Stiffness of left ankle, not elsewhere classified: Secondary | ICD-10-CM | POA: Diagnosis not present

## 2021-04-20 DIAGNOSIS — R2689 Other abnormalities of gait and mobility: Secondary | ICD-10-CM

## 2021-04-20 NOTE — Therapy (Addendum)
[facility] [address] [place], [place], [address] Phone: [phone]   Fax:  [phone]  Physical Therapy Treatment  Patient Details  Name: Miguel [name] MRN: [id number] Date of Birth: Mar 17, 1956 Referring Provider (PT): Dr. Marcelino Scot   Encounter Date: 04/20/2021   PT End of Session - 04/20/21 1032     Visit Number 14    Number of Visits 23    Date for PT Re-Evaluation [id number]   per recert on [id number]   Authorization Type BCBS - VL 30 for PT/OT (will restart on  03/04/21)    Authorization - Visit Number 11    Authorization - Number of Visits 30    PT Start Time [id number]    PT Stop Time 1016    PT Time Calculation (min) 44 min    Equipment Utilized During Treatment Gait belt    Activity Tolerance Patient tolerated treatment well    Behavior During Therapy WFL for tasks assessed/performed             Past Medical History:  Diagnosis Date   Cervical dystonia    Depression    Erectile dysfunction due to arterial insufficiency    Functional dyspepsia    Hypertension    Pancreatitis 2005   likely secondary to alcohol use   Rosacea     Past Surgical History:  Procedure Laterality Date   no past surgery     TIBIA IM NAIL INSERTION Left 02/07/2021   Procedure: INTRAMEDULLARY (IM) NAIL TIBIAL;  Surgeon: Altamese [facility], MD;  Location: [facility];  Service: Orthopedics;  Laterality: Left;    There were no vitals filed for this visit.   Subjective Assessment - 04/20/21 0936     Subjective Had some episodes of dizziness over the weekend. Did some walking over the weekend - 1,000' outdoors with cane with spouse. Had a dizzy day after that and was exhausted.    Pertinent History Miguel [name]. history of cervical dystonia and receives Xeomin injections in the past followed by neurology service Dr. Evelena Leyden at [facility], hypertension, depression, tobacco and alcohol use    How long can you walk comfortably? farthest  walk in hospital was 33' with RW.    Diagnostic tests MRI showed acute infarct left inferior posterior cerebellum.  Small acute infarct right thalamus.    Patient Stated Goals build up his legs and arms, walking again    Currently in Pain? No/denies                               [facility] Adult PT Treatment/Exercise - 04/20/21 0939       Ambulation/Gait   Ambulation/Gait Yes    Ambulation/Gait Assistance 4: Min guard    Ambulation/Gait Assistance Details with SPC, initial cues for sequencing and tactile cues at pt's head for midline, then performed gait ambulating with a mirror approx. 68' away as visual cue for pt to midline orientation during gait    Ambulation Distance (Feet) 115 Feet   x1, 80 x 2   Assistive device Straight cane    Gait Pattern Decreased weight shift to left;Step-through pattern;Ataxic    Ambulation Surface Level;Indoor    Gait Comments pt and pt's spouse asking about a rollator for improved gait endurance and efficiency outdoors compared to RW, discussed will trial at next session to determine safety      Neuro Re-ed    Neuro Re-ed Details  Seated on mat with L hip wedged to shift weight to R and reaching RUE over to the R to keep trunk and head in midline: with laser on pt's forehead and using tagets: head turns to R <> midline x5 reps, holding head to R and moving up and down side of back of mirror x5 reps, from midline to diagonals (up to R and down to R) x5 reps with holding at end range, moving head to R with eyes open and then with eyes closed back to midlin x10 reps - improved with incr reps, sit <> stands x5 reps with keeping head in center of wall, standing head turns to R and midline x8 reps (therapist assisitng with midline and RUE reaching to R to pt's RW)                      PT Short Term Goals - 04/13/21 0758       PT SHORT TERM GOAL #1   Title Pt will perform TUG with SPC in 16 seconds or less with supervision/min guard  in order to demo decr fall risk. ALL STGS DUE 05/04/21    Baseline 16.28 seconds with min guard/min A    Time 3    Period Weeks    Status New    Target Date 05/04/21      PT SHORT TERM GOAL #2   Title Pt will undergo further gait speed assessment with cane with LTG written.    Baseline 2.71 ft/sec with RW    Time 3    Period Weeks    Status New      PT SHORT TERM GOAL #3   Title Pt will ambulate at least 230' over indoor level surfaces with cane with supervision/min guard in order to demo improved household mobility.    Baseline --    Time 3    Period Weeks    Status New      PT SHORT TERM GOAL #4   Title Pt will improve BERG score to at least a 46/56 in order to demo decr fall risk.    Baseline 42/56    Time 3    Period Weeks    Status New               PT Long Term Goals - 04/13/21 0801       PT LONG TERM GOAL #1   Title Gait speed goal to be written with SPC in order to demo decr fall risk. ALL LTGS DUE 05/25/21    Baseline not yet assessed    Time 6    Period Weeks    Status New    Target Date 05/25/21      PT LONG TERM GOAL #2   Title Pt will ambulate at least 200' with LRAD over outdoor unlevel surfaces with supervision in order to demo improved community mobility.    Baseline using RW for outdoor gait    Time 6    Period Weeks    Status New      PT LONG TERM GOAL #3   Title Pt will perform TUG with SPC with supervision in 15 seconds or lesss in order to demo decr fall risk.    Baseline 04/06/21: 21.63 sec's with RW with min guard assist, 16.28 sec's with cane with up to min assist    Time 6    Period Weeks    Status New      PT LONG  TERM GOAL #4   Title Pt will improve FOTO score to 57 in order to demo improved functional outcomes.    Baseline 44    Time 6    Period Weeks    Status On-going      PT LONG TERM GOAL #5   Title Pt will improve BERG score to at least a 50/56 in order to demo decr fall risk.    Baseline 42/56    Time 6    Period  Weeks    Status New                   Plan - 04/20/21 1545     Clinical Impression Statement Today's skilled session focused on cervical proprioception and reorientation to midline (pt with tendency to be towards L due to hx of torticollis). Used laser today for proprioception exercises in seated with head motions to R and with eys closed, sit <> stand transfers, and standing. With incr reps of eyes closed and finding midline from R, pt improved with incr reps and was able to improve from 6 deg on L to 1-2 deg away from midline. Pt tolerated session well with no reports of dizziness throughout.    Personal Factors and Comorbidities Comorbidity 3+;Past/Current Experience    Comorbidities Cervical dystonia, HTN, depression, anxiety.    Examination-Activity Limitations Stairs;Stand;Squat;Locomotion Level;Transfers;Bend    Examination-Participation Restrictions Community Activity;Driving;Yard Work;Occupation    Stability/Clinical Decision Making Evolving/Moderate complexity    Rehab Potential Good    PT Frequency Other (comment)   3x week for 1 week followed by 1-2x week for 5 weeks   PT Duration --   3x week for 1 week followed by 1-2x week for 5 weeks   PT Treatment/Interventions ADLs/Self Care Home Management;Stair training;Gait training;DME Instruction;Functional mobility training;Therapeutic activities;Therapeutic exercise;Neuromuscular re-education;Balance training;Patient/family education;Vestibular;Manual techniques;Passive range of motion    PT Next Visit Plan continue using laser - work on neck proprioception, reorientation to midline begin in sitting > standing > walking.  Incorporate head movements and visual scanning. continue to work on gait with RW (progressing to cane as able), LE and core strengthening and balance training on compliant surfaces. L ankle ROM    PT Home Exercise Plan Access Code [email or web address]    Consulted and Agree with Plan of Care Patient              Patient will benefit from skilled therapeutic intervention in order to improve the following deficits and impairments:  Abnormal gait, Decreased coordination, Decreased balance, Decreased safety awareness, Decreased strength, Difficulty walking, Dizziness, Decreased activity tolerance  Visit Diagnosis: Other abnormalities of gait and mobility  Unsteadiness on feet  Dizziness and giddiness     Problem List Patient Active Problem List   Diagnosis Date Noted   Fall 02/05/2021   Fracture tibia/fibula, left, closed, initial encounter 02/05/2021   Cerebrovascular accident (CVA) of right thalamus ([place]) 12/31/2020   Cerebral infarction due to embolism of left cerebellar artery ([place]) 12/31/2020   Alcohol use 12/19/2020   Acute CVA (cerebrovascular accident) ([place]) 12/18/2020   Depression with anxiety 12/18/2020   Essential hypertension 12/18/2020   Isolated cervical dystonia 12/06/2016    Arliss Journey, PT, DPT  04/20/2021, 4:28 PM  [facility] [address] [place], [place], [address] Phone: [phone]   Fax:  [phone]  Name: Rohn Welden MRN: [id number] Date of Birth: 17-Jul-1956

## 2021-04-22 DIAGNOSIS — G243 Spasmodic torticollis: Secondary | ICD-10-CM | POA: Diagnosis not present

## 2021-04-23 ENCOUNTER — Encounter: Payer: Self-pay | Admitting: Physical Therapy

## 2021-04-23 ENCOUNTER — Other Ambulatory Visit: Payer: Self-pay

## 2021-04-23 ENCOUNTER — Ambulatory Visit: Payer: BC Managed Care – PPO | Admitting: Physical Therapy

## 2021-04-23 DIAGNOSIS — R278 Other lack of coordination: Secondary | ICD-10-CM | POA: Diagnosis not present

## 2021-04-23 DIAGNOSIS — R42 Dizziness and giddiness: Secondary | ICD-10-CM | POA: Diagnosis not present

## 2021-04-23 DIAGNOSIS — R29818 Other symptoms and signs involving the nervous system: Secondary | ICD-10-CM

## 2021-04-23 DIAGNOSIS — R270 Ataxia, unspecified: Secondary | ICD-10-CM | POA: Diagnosis not present

## 2021-04-23 DIAGNOSIS — R2689 Other abnormalities of gait and mobility: Secondary | ICD-10-CM

## 2021-04-23 DIAGNOSIS — M25672 Stiffness of left ankle, not elsewhere classified: Secondary | ICD-10-CM | POA: Diagnosis not present

## 2021-04-23 DIAGNOSIS — M6281 Muscle weakness (generalized): Secondary | ICD-10-CM | POA: Diagnosis not present

## 2021-04-23 DIAGNOSIS — R2681 Unsteadiness on feet: Secondary | ICD-10-CM | POA: Diagnosis not present

## 2021-04-23 DIAGNOSIS — R260 Ataxic gait: Secondary | ICD-10-CM | POA: Diagnosis not present

## 2021-04-23 NOTE — Therapy (Addendum)
[facility] [address] [place], [place], [address] Phone: [phone]   Fax:  [phone]  Physical Therapy Treatment  Patient Details  Name: Miguel [name] MRN: [id number] Date of Birth: 1956/07/17 Referring Provider (PT): Dr. Marcelino Scot   Encounter Date: 04/23/2021   PT End of Session - 04/23/21 0931     Visit Number 15    Number of Visits 23    Date for PT Re-Evaluation [id number]   per recert on [id number]   Authorization Type BCBS - VL 30 for PT/OT (will restart on  03/04/21)    Authorization - Visit Number 12    Authorization - Number of Visits 30    PT Start Time 0800    PT Stop Time 0845    PT Time Calculation (min) 45 min    Equipment Utilized During Treatment Gait belt    Activity Tolerance Patient tolerated treatment well    Behavior During Therapy [facility] for tasks assessed/performed             Past Medical History:  Diagnosis Date   Cervical dystonia    Depression    Erectile dysfunction due to arterial insufficiency    Functional dyspepsia    Hypertension    Pancreatitis 2005   likely secondary to alcohol use   Rosacea     Past Surgical History:  Procedure Laterality Date   no past surgery     TIBIA IM NAIL INSERTION Left 02/07/2021   Procedure: INTRAMEDULLARY (IM) NAIL TIBIAL;  Surgeon: Altamese [place], MD;  Location: [place];  Service: Orthopedics;  Laterality: Left;    There were no vitals filed for this visit.   Subjective Assessment - 04/23/21 0802     Subjective No dizziness today. Dizziness the past couple of days have been off and on, but overall getting better. Did some driving with his wife (pt is cleared to drive with a licensed driver small distances) and it went well.    Pertinent History [name]. history of cervical dystonia and receives Xeomin injections in the past followed by neurology service Dr. Evelena Leyden at [facility], hypertension, depression, tobacco and alcohol use     How long can you walk comfortably? farthest walk in hospital was 52' with RW.    Diagnostic tests MRI showed acute infarct left inferior posterior cerebellum.  Small acute infarct right thalamus.    Patient Stated Goals build up his legs and arms, walking again    Currently in Pain? No/denies                               [facility] Adult PT Treatment/Exercise - 04/23/21 0814       Transfers   Transfers Sit to Stand;Stand to Sit    Sit to Stand 5: Supervision;With upper extremity assist;4: Min guard    Stand to Sit 5: Supervision;With upper extremity assist;To bed;To chair/3-in-1    Comments throuhgout session with proper brake management of rollator      Ambulation/Gait   Ambulation/Gait Yes    Ambulation/Gait Assistance 4: Min guard;5: Supervision    Ambulation/Gait Assistance Details trialed rollator today per pt request and to help with easier propulsion over outdoor surfaces, practiced indoors with initial cues needed to stay closer to rollator with pt demosntrating good carryover throughout gait and good pace without bumping either leg into rollator. performed gait outdoors (~200') with cues/instruction for use of brakes when going down  inclines and staying close to rollator when ascending. will continue to practice in session. initial instruction provided for brake management with transfers. showed and had pt demonstrate backing up rollator to mat table and locking brakes and having a seat on rollator, pt able to perform with supervision    Ambulation Distance (Feet) 345 Feet   x1   Assistive device Other (Comment)   rollator   Gait Pattern Decreased weight shift to left;Step-through pattern;Ataxic    Ambulation Surface Level;Indoor;Outdoor;Paved    Gait Comments practicing gait weaving in and out of 5 narrow cones and then cones spread out for more figure 8 turns, min guard for more narrow turns and cues to stay closer and inside BOS of rollator      Neuro Re-ed     Neuro Re-ed Details  Seated on mat with L hip wedged to shift weight to R and reaching RUE over to the R (to wedge) to keep trunk and head in midline: with laser on pt's forehead and using tagets:  moving head to R with eyes open and then with eyes closed back to midline x8 reps and opening once pt feels his body in midline, moving head up with eyes open and then closing eyes and bringing head back down to what pt feels is midline x8 reps. Improved with incr reps - initially more biased to L. with eyes open: tracing arc to R and then back to midline x5 reps                      PT Short Term Goals - 04/13/21 0758       PT SHORT TERM GOAL #1   Title Pt will perform TUG with SPC in 16 seconds or less with supervision/min guard in order to demo decr fall risk. ALL STGS DUE 05/04/21    Baseline 16.28 seconds with min guard/min A    Time 3    Period Weeks    Status New    Target Date 05/04/21      PT SHORT TERM GOAL #2   Title Pt will undergo further gait speed assessment with cane with LTG written.    Baseline 2.71 ft/sec with RW    Time 3    Period Weeks    Status New      PT SHORT TERM GOAL #3   Title Pt will ambulate at least 230' over indoor level surfaces with cane with supervision/min guard in order to demo improved household mobility.    Baseline --    Time 3    Period Weeks    Status New      PT SHORT TERM GOAL #4   Title Pt will improve BERG score to at least a 46/56 in order to demo decr fall risk.    Baseline 42/56    Time 3    Period Weeks    Status New               PT Long Term Goals - 04/13/21 0801       PT LONG TERM GOAL #1   Title Gait speed goal to be written with SPC in order to demo decr fall risk. ALL LTGS DUE 05/25/21    Baseline not yet assessed    Time 6    Period Weeks    Status New    Target Date 05/25/21      PT LONG TERM GOAL #2   Title Pt will ambulate at  least 200' with LRAD over outdoor unlevel surfaces with supervision in  order to demo improved community mobility.    Baseline using RW for outdoor gait    Time 6    Period Weeks    Status New      PT LONG TERM GOAL #3   Title Pt will perform TUG with SPC with supervision in 15 seconds or lesss in order to demo decr fall risk.    Baseline 04/06/21: 21.63 sec's with RW with min guard assist, 16.28 sec's with cane with up to min assist    Time 6    Period Weeks    Status New      PT LONG TERM GOAL #4   Title Pt will improve FOTO score to 57 in order to demo improved functional outcomes.    Baseline 44    Time 6    Period Weeks    Status On-going      PT LONG TERM GOAL #5   Title Pt will improve BERG score to at least a 50/56 in order to demo decr fall risk.    Baseline 42/56    Time 6    Period Weeks    Status New                   Plan - 04/23/21 1222     Clinical Impression Statement Trialed rollator today with gait indoors, outdoors, and navigating around more narrow and wide turns. Pt did well with rollator - had good pace and was able to maintain close for safety. Does still need more practice with turning with rollator, with min guard at times and cues to stay closer. Will continue to practice, esp outdoors on unlevel surfaces for improved community mobility. Continued with laser today for cervical proprioception - trying with eyes closed from R>midline and up >midline in sitting. Pt improving with incr reps and able to get it slightly closer to the middle, but still opens eyes when more to the L. Will continue to progress towards LTGs.    Personal Factors and Comorbidities Comorbidity 3+;Past/Current Experience    Comorbidities Cervical dystonia, HTN, depression, anxiety.    Examination-Activity Limitations Stairs;Stand;Squat;Locomotion Level;Transfers;Bend    Examination-Participation Restrictions Community Activity;Driving;Yard Work;Occupation    Stability/Clinical Decision Making Evolving/Moderate complexity    Rehab Potential Good     PT Frequency 2x / week   12 visits over 8 weeks   PT Duration 8 weeks    PT Treatment/Interventions ADLs/Self Care Home Management;Stair training;Gait training;DME Instruction;Functional mobility training;Therapeutic activities;Therapeutic exercise;Neuromuscular re-education;Balance training;Patient/family education;Vestibular;Manual techniques;Passive range of motion    PT Next Visit Plan continue using laser - work on neck proprioception, reorientation to midline begin in sitting > standing > walking. trying with eyes closed and then back to midline.  Incorporate head movements and visual scanning. continue to work on gait with RW and rollator for community distances (progressing to cane as able), LE and core strengthening and balance training on compliant surfaces. L ankle ROM    PT Home Exercise Plan Access Code [id number]    Consulted and Agree with Plan of Care Patient             Patient will benefit from skilled therapeutic intervention in order to improve the following deficits and impairments:  Abnormal gait, Decreased coordination, Decreased balance, Decreased safety awareness, Decreased strength, Difficulty walking, Dizziness, Decreased activity tolerance  Visit Diagnosis: Other abnormalities of gait and mobility  Unsteadiness on feet  Other symptoms and signs  involving the nervous system     Problem List Patient Active Problem List   Diagnosis Date Noted   Fall 02/05/2021   Fracture tibia/fibula, left, closed, initial encounter 02/05/2021   Cerebrovascular accident (CVA) of right thalamus ([place]) 12/31/2020   Cerebral infarction due to embolism of left cerebellar artery ([place]) 12/31/2020   Alcohol use 12/19/2020   Acute CVA (cerebrovascular accident) ([place]) 12/18/2020   Depression with anxiety 12/18/2020   Essential hypertension 12/18/2020   Isolated cervical dystonia 12/06/2016    Arliss Journey, PT, DPT  04/23/2021, 1:33 PM  [facility] [address] [place], [place], [address] Phone: [phone]   Fax:  [phone]  Name: Miguel [name] MRN: [id number] Date of Birth: 1956-07-25

## 2021-04-27 ENCOUNTER — Other Ambulatory Visit: Payer: Self-pay

## 2021-04-27 ENCOUNTER — Ambulatory Visit: Payer: BC Managed Care – PPO | Admitting: Physical Therapy

## 2021-04-27 DIAGNOSIS — R29818 Other symptoms and signs involving the nervous system: Secondary | ICD-10-CM | POA: Diagnosis not present

## 2021-04-27 DIAGNOSIS — R2689 Other abnormalities of gait and mobility: Secondary | ICD-10-CM

## 2021-04-27 DIAGNOSIS — R2681 Unsteadiness on feet: Secondary | ICD-10-CM

## 2021-04-27 DIAGNOSIS — R260 Ataxic gait: Secondary | ICD-10-CM | POA: Diagnosis not present

## 2021-04-27 DIAGNOSIS — R42 Dizziness and giddiness: Secondary | ICD-10-CM | POA: Diagnosis not present

## 2021-04-27 DIAGNOSIS — M25672 Stiffness of left ankle, not elsewhere classified: Secondary | ICD-10-CM | POA: Diagnosis not present

## 2021-04-27 DIAGNOSIS — R270 Ataxia, unspecified: Secondary | ICD-10-CM | POA: Diagnosis not present

## 2021-04-27 DIAGNOSIS — M6281 Muscle weakness (generalized): Secondary | ICD-10-CM | POA: Diagnosis not present

## 2021-04-27 DIAGNOSIS — R278 Other lack of coordination: Secondary | ICD-10-CM | POA: Diagnosis not present

## 2021-04-27 NOTE — Therapy (Signed)
[facility] [address] [place], [place], [address] Phone: [phone]   Fax:  [phone]  Physical Therapy Treatment  Patient Details  Name: Miguel [name] MRN: [id number] Date of Birth: July 16, 1956 Referring Provider (PT): Dr. Marcelino Scot   Encounter Date: 04/27/2021   PT End of Session - 04/27/21 0808     Visit Number 16    Number of Visits 23    Date for PT Re-Evaluation [id number]   per recert on 99991111   Authorization Type BCBS - VL 30 for PT/OT (will restart on  03/04/21)    Authorization - Visit Number 13    Authorization - Number of Visits 30    PT Start Time 0805    PT Stop Time [id number]    PT Time Calculation (min) 38 min    Equipment Utilized During Treatment Gait belt    Activity Tolerance Patient tolerated treatment well    Behavior During Therapy [facility] for tasks assessed/performed             Past Medical History:  Diagnosis Date   Cervical dystonia    Depression    Erectile dysfunction due to arterial insufficiency    Functional dyspepsia    Hypertension    Pancreatitis 2005   likely secondary to alcohol use   Rosacea     Past Surgical History:  Procedure Laterality Date   no past surgery     TIBIA IM NAIL INSERTION Left 02/07/2021   Procedure: INTRAMEDULLARY (IM) NAIL TIBIAL;  Surgeon: Altamese [place], MD;  Location: [facility];  Service: Orthopedics;  Laterality: Left;    There were no vitals filed for this visit.   Subjective Assessment - 04/27/21 0805     Subjective No new complaints. No falls. Has new glasses he got this past Friday. Still getting used to them (in his pocket on arrival). Pt states that the right lense is not correct. spouse feels it's that he is not used to it because he will not wear them. Has not heard anything about Botox injection for neck to address Torticollis. See's Dr. Marcelino Scot tomorrow for follow up to all the fractures. To clinic with cane today.    Pertinent History GOES BY  Miguel [name]. history of cervical dystonia and receives Xeomin injections in the past followed by neurology service Dr. Evelena Leyden at [facility], hypertension, depression, tobacco and alcohol use    How long can you walk comfortably? farthest walk in hospital was 34' with RW.    Diagnostic tests MRI showed acute infarct left inferior posterior cerebellum.  Small acute infarct right thalamus.    Patient Stated Goals build up his legs and arms, walking again    Currently in Pain? No/denies    Pain Score 0-No pain                   [facility] Adult PT Treatment/Exercise - 04/27/21 0809       Transfers   Transfers Sit to Stand;Stand to Sit    Sit to Stand 5: Supervision;With upper extremity assist;4: Min guard    Stand to Sit 5: Supervision;With upper extremity assist;To bed;To chair/3-in-1      Ambulation/Gait   Ambulation/Gait Yes    Ambulation/Gait Assistance 4: Min guard    Ambulation/Gait Assistance Details continued with use of rollator with cues on hand placement on brakes to allow for use while walking and to keep rollator closer with gait/back up/turns; remainder of gait with straight cane with min guard to  min assist. pt with increaesed lateral sway at times, along with veering needing assistance with balance.    Ambulation Distance (Feet) 500 Feet   x1 with rollator, 230 x1 with cane no glasses,   Assistive device Rollator;Straight cane    Gait Pattern Decreased weight shift to left;Step-through pattern;Ataxic    Ambulation Surface Level;Indoor;Unlevel;Outdoor;Paved      Neuro Re-ed    Neuro Re-ed Details  Seated on mat with L hip wedged to shift weight to R and reaching RUE over to the R (to another wedge) to keep trunk and head in midline: with laser on pt's forehead and using tagets:  moving head to R with eyes open and then with eyes closed back to midline for 10-12 reps, then up<>down to midline with eyes open to go up, closed to come down for ~10 reps each way.  progressed to static standing with feet hip width apart- performed as did in sitting with assist at shoulder for weight shifitng to promote neurtral body posture.                 PT Short Term Goals - 04/13/21 0758       PT SHORT TERM GOAL #1   Title Pt will perform TUG with SPC in 16 seconds or less with supervision/min guard in order to demo decr fall risk. ALL STGS DUE 05/04/21    Baseline 16.28 seconds with min guard/min A    Time 3    Period Weeks    Status New    Target Date 05/04/21      PT SHORT TERM GOAL #2   Title Pt will undergo further gait speed assessment with cane with LTG written.    Baseline 2.71 ft/sec with RW    Time 3    Period Weeks    Status New      PT SHORT TERM GOAL #3   Title Pt will ambulate at least 230' over indoor level surfaces with cane with supervision/min guard in order to demo improved household mobility.    Baseline --    Time 3    Period Weeks    Status New      PT SHORT TERM GOAL #4   Title Pt will improve BERG score to at least a 46/56 in order to demo decr fall risk.    Baseline 42/56    Time 3    Period Weeks    Status New               PT Long Term Goals - 04/13/21 0801       PT LONG TERM GOAL #1   Title Gait speed goal to be written with SPC in order to demo decr fall risk. ALL LTGS DUE 05/25/21    Baseline not yet assessed    Time 6    Period Weeks    Status New    Target Date 05/25/21      PT LONG TERM GOAL #2   Title Pt will ambulate at least 200' with LRAD over outdoor unlevel surfaces with supervision in order to demo improved community mobility.    Baseline using RW for outdoor gait    Time 6    Period Weeks    Status New      PT LONG TERM GOAL #3   Title Pt will perform TUG with SPC with supervision in 15 seconds or lesss in order to demo decr fall risk.    Baseline 04/06/21: 21.63 sec's  with RW with min guard assist, 16.28 sec's with cane with up to min assist    Time 6    Period Weeks    Status  New      PT LONG TERM GOAL #4   Title Pt will improve FOTO score to 57 in order to demo improved functional outcomes.    Baseline 44    Time 6    Period Weeks    Status On-going      PT LONG TERM GOAL #5   Title Pt will improve BERG score to at least a 50/56 in order to demo decr fall risk.    Baseline 42/56    Time 6    Period Weeks    Status New                   Plan - 04/27/21 0808     Clinical Impression Statement Today's skilled session continued to focus on gait training with rollator for outdoor longer distances and cane on indoor surfaces for shorter distances with up to min assist needed with use of cane. Continued to need cues for brake use on rollator with no significant balance issues noted. Also continued with use of laser for cervical proprioception this session, progressing to performing tasks in standing. No issues noted or reported in session. Pt encouraged to try to wear new glasses more to see if he occomodates to them and follow up with eye MD as needed. Pt and spouse verbalized understanding. The pt should benefit from continued PT to progress toward unmet goals.    Personal Factors and Comorbidities Comorbidity 3+;Past/Current Experience    Comorbidities Cervical dystonia, HTN, depression, anxiety.    Examination-Activity Limitations Stairs;Stand;Squat;Locomotion Level;Transfers;Bend    Examination-Participation Restrictions Community Activity;Driving;Yard Work;Occupation    Stability/Clinical Decision Making Evolving/Moderate complexity    Rehab Potential Good    PT Frequency 2x / week   12 visits over 8 weeks   PT Duration 8 weeks    PT Treatment/Interventions ADLs/Self Care Home Management;Stair training;Gait training;DME Instruction;Functional mobility training;Therapeutic activities;Therapeutic exercise;Neuromuscular re-education;Balance training;Patient/family education;Vestibular;Manual techniques;Passive range of motion    PT Next Visit Plan  continue using laser - work on neck proprioception, reorientation to midline begin in sitting > standing > walking. trying with eyes closed and then back to midline.  Incorporate head movements and visual scanning. continue to work on gait with RW and rollator for community distances (progressing to cane as able), LE and core strengthening and balance training on compliant surfaces. L ankle ROM    PT Home Exercise Plan Access Code [id number]    Consulted and Agree with Plan of Care Patient             Patient will benefit from skilled therapeutic intervention in order to improve the following deficits and impairments:  Abnormal gait, Decreased coordination, Decreased balance, Decreased safety awareness, Decreased strength, Difficulty walking, Dizziness, Decreased activity tolerance  Visit Diagnosis: Other abnormalities of gait and mobility  Unsteadiness on feet  Other symptoms and signs involving the nervous system  Dizziness and giddiness     Problem List Patient Active Problem List   Diagnosis Date Noted   Fall 02/05/2021   Fracture tibia/fibula, left, closed, initial encounter 02/05/2021   Cerebrovascular accident (CVA) of right thalamus ([place]) 12/31/2020   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

## 2021-04-28 DIAGNOSIS — S82242D Displaced spiral fracture of shaft of left tibia, subsequent encounter for closed fracture with routine healing: Secondary | ICD-10-CM | POA: Diagnosis not present

## 2021-04-28 DIAGNOSIS — S82465D Nondisplaced segmental fracture of shaft of left fibula, subsequent encounter for closed fracture with routine healing: Secondary | ICD-10-CM | POA: Diagnosis not present

## 2021-04-30 ENCOUNTER — Ambulatory Visit: Payer: BC Managed Care – PPO | Admitting: Physical Therapy

## 2021-04-30 ENCOUNTER — Other Ambulatory Visit: Payer: Self-pay

## 2021-04-30 ENCOUNTER — Encounter: Payer: Self-pay | Admitting: Physical Therapy

## 2021-04-30 DIAGNOSIS — R2681 Unsteadiness on feet: Secondary | ICD-10-CM | POA: Diagnosis not present

## 2021-04-30 DIAGNOSIS — R278 Other lack of coordination: Secondary | ICD-10-CM | POA: Diagnosis not present

## 2021-04-30 DIAGNOSIS — R270 Ataxia, unspecified: Secondary | ICD-10-CM | POA: Diagnosis not present

## 2021-04-30 DIAGNOSIS — M25672 Stiffness of left ankle, not elsewhere classified: Secondary | ICD-10-CM | POA: Diagnosis not present

## 2021-04-30 DIAGNOSIS — R260 Ataxic gait: Secondary | ICD-10-CM | POA: Diagnosis not present

## 2021-04-30 DIAGNOSIS — R29818 Other symptoms and signs involving the nervous system: Secondary | ICD-10-CM

## 2021-04-30 DIAGNOSIS — M6281 Muscle weakness (generalized): Secondary | ICD-10-CM | POA: Diagnosis not present

## 2021-04-30 DIAGNOSIS — R2689 Other abnormalities of gait and mobility: Secondary | ICD-10-CM

## 2021-04-30 DIAGNOSIS — R42 Dizziness and giddiness: Secondary | ICD-10-CM | POA: Diagnosis not present

## 2021-04-30 NOTE — Patient Instructions (Signed)
Access Code: [email or web address] URL: [email or web address] Date: 04/30/2021 Prepared by: Willow Ora  Exercises Seated Hamstring Stretch with Strap - 1 x daily - 5 x weekly - 1 sets - 3 reps - 30 hold Side Stepping with Resistance at Ankles and Counter Support - 1 x daily - 5 x weekly - 1 sets - 3 reps Tandem Walking with Counter Support - 1 x daily - 5 x weekly - 1 sets - 3 reps Standing Single Leg Stance with Counter Support - 1 x daily - 5 x weekly - 1 sets - 3 reps - 15 second hold Standing Near Stance in Corner with Eyes Closed - 1 x daily - 5 x weekly - 1 sets - 3 reps - 30 hold Standing Balance in Corner with Eyes Closed - 1 x daily - 5 x weekly - 1 sets - 10 reps

## 2021-05-02 NOTE — Therapy (Signed)
[facility] [address] [place], [place], [address] Phone: [phone]   Fax:  [phone]  Physical Therapy Treatment  Patient Details  Name: Miguel [name] MRN: [id number] Date of Birth: Mar 12, 1956 Referring Provider (PT): Dr. Marcelino Scot   Encounter Date: 04/30/2021    04/30/21 0807  PT Visits / Re-Eval  Visit Number 17  Number of Visits 23  Date for PT Re-Evaluation [id number] (per recert on [id number])  Authorization  Authorization Type BCBS - VL 30 for PT/OT (will restart on  03/04/21)  Authorization - Visit Number 14  Authorization - Number of Visits 30  PT Time Calculation  PT Start Time 0804  PT Stop Time 0845  PT Time Calculation (min) 41 min  PT - End of Session  Equipment Utilized During Treatment Gait belt  Activity Tolerance Patient tolerated treatment well  Behavior During Therapy [facility] for tasks assessed/performed    Past Medical History:  Diagnosis Date   Cervical dystonia    Depression    Erectile dysfunction due to arterial insufficiency    Functional dyspepsia    Hypertension    Pancreatitis 2005   likely secondary to alcohol use   Rosacea     Past Surgical History:  Procedure Laterality Date   no past surgery     TIBIA IM NAIL INSERTION Left 02/07/2021   Procedure: INTRAMEDULLARY (IM) NAIL TIBIAL;  Surgeon: Altamese Bel-Nor, MD;  Location: [facility];  Service: Orthopedics;  Laterality: Left;    There were no vitals filed for this visit.    04/30/21 0806  Symptoms/Limitations  Subjective No new complaints. Was released from Dr. Marcelino Scot. Not sure when he will get the botox for neck, they had called to have him come in with only one hour notice and was not able to do that. Waiting to hear back from them. Reports the dizziness is better, less episodes. Using RW today.  Pertinent History Miguel [name]. history of cervical dystonia and receives Xeomin injections in the past followed by neurology service Dr.  Evelena Leyden at [facility], hypertension, depression, tobacco and alcohol use  How long can you walk comfortably? farthest walk in hospital was 46' with RW.  Diagnostic tests MRI showed acute infarct left inferior posterior cerebellum.  Small acute infarct right thalamus.  Patient Stated Goals build up his legs and arms, walking again  Pain Assessment  Currently in Pain? No/denies  Pain Score 0     04/30/21 0836  Transfers  Transfers Sit to Stand;Stand to Sit  Sit to Stand 5: Supervision;With upper extremity assist;4: Min guard  Stand to Sit 5: Supervision;With upper extremity assist;To bed;To chair/3-in-1  Ambulation/Gait  Ambulation/Gait Yes  Ambulation/Gait Assistance 5: Supervision  Ambulation Distance (Feet)  (around clinic with session)  Assistive device Rolling walker  Gait Pattern Decreased weight shift to left;Step-through pattern;Ataxic  Ambulation Surface Level;Indoor  High Level Balance  High Level Balance Activities Negotitating around obstacles  High Level Balance Comments with RW- 3 cones on floor working on weaving aroung them with 180* turns at each end for 2 laps with cues to stay within the walker and for step length with turns. Min guard assist for balance.  Neuro Re-ed   Neuro Re-ed Details  updated HEP for balance. Refer to [facility] for full details. Min guard assist for safety.     Issued to HEP this session: Access Code: [email or web address] URL: [email or web address] Date: 04/30/2021 Prepared by: Willow Ora  Exercises Seated Hamstring Stretch with Strap - 1 x  daily - 5 x weekly - 1 sets - 3 reps - 30 hold Side Stepping with Resistance at Ankles and Counter Support - 1 x daily - 5 x weekly - 1 sets - 3 reps Tandem Walking with Counter Support - 1 x daily - 5 x weekly - 1 sets - 3 reps Standing Single Leg Stance with Counter Support - 1 x daily - 5 x weekly - 1 sets - 3 reps - 15 second hold Standing Near Stance in Corner with Eyes Closed -  1 x daily - 5 x weekly - 1 sets - 3 reps - 30 hold Standing Balance in Corner with Eyes Closed - 1 x daily - 5 x weekly - 1 sets - 10 reps       04/30/21 2138  PT Education  Education Details updated HEP  Person(s) Educated Patient;Spouse  Methods Explanation;Demonstration;Verbal cues;Handout  Comprehension Verbalized understanding;Returned demonstration;Verbal cues required;Need further instruction         PT Short Term Goals - 04/13/21 0758       PT SHORT TERM GOAL #1   Title Pt will perform TUG with SPC in 16 seconds or less with supervision/min guard in order to demo decr fall risk. ALL STGS DUE 05/04/21    Baseline 16.28 seconds with min guard/min A    Time 3    Period Weeks    Status New    Target Date 05/04/21      PT SHORT TERM GOAL #2   Title Pt will undergo further gait speed assessment with cane with LTG written.    Baseline 2.71 ft/sec with RW    Time 3    Period Weeks    Status New      PT SHORT TERM GOAL #3   Title Pt will ambulate at least 230' over indoor level surfaces with cane with supervision/min guard in order to demo improved household mobility.    Baseline --    Time 3    Period Weeks    Status New      PT SHORT TERM GOAL #4   Title Pt will improve BERG score to at least a 46/56 in order to demo decr fall risk.    Baseline 42/56    Time 3    Period Weeks    Status New               PT Long Term Goals - 04/13/21 0801       PT LONG TERM GOAL #1   Title Gait speed goal to be written with SPC in order to demo decr fall risk. ALL LTGS DUE 05/25/21    Baseline not yet assessed    Time 6    Period Weeks    Status New    Target Date 05/25/21      PT LONG TERM GOAL #2   Title Pt will ambulate at least 200' with LRAD over outdoor unlevel surfaces with supervision in order to demo improved community mobility.    Baseline using RW for outdoor gait    Time 6    Period Weeks    Status New      PT LONG TERM GOAL #3   Title Pt will  perform TUG with SPC with supervision in 15 seconds or lesss in order to demo decr fall risk.    Baseline 04/06/21: 21.63 sec's with RW with min guard assist, 16.28 sec's with cane with up to min assist    Time 6  Period Weeks    Status New      PT LONG TERM GOAL #4   Title Pt will improve FOTO score to 57 in order to demo improved functional outcomes.    Baseline 44    Time 6    Period Weeks    Status On-going      PT LONG TERM GOAL #5   Title Pt will improve BERG score to at least a 50/56 in order to demo decr fall risk.    Baseline 42/56    Time 6    Period Weeks    Status New              04/30/21 0808  Plan  Clinical Impression Statement Today's skilled session initially focused on advancing pt's HEP to include more balance components as pt reproted his current program was getting easy. No issues noted or reported with new ex's performed in session today. Remainder of session focused on use of RW to negotiate around objects with cues needed to stay withing the walker. No dizziness or other symptoms reported with session. The pt is progressing and should benefit from continued PT to progress toward unmet goals.  Personal Factors and Comorbidities Comorbidity 3+;Past/Current Experience  Comorbidities Cervical dystonia, HTN, depression, anxiety.  Examination-Activity Limitations Stairs;Stand;Squat;Locomotion Level;Transfers;Bend  Examination-Participation Restrictions Community Activity;Driving;Yard Work;Occupation  Pt will benefit from skilled therapeutic intervention in order to improve on the following deficits Abnormal gait;Decreased coordination;Decreased balance;Decreased safety awareness;Decreased strength;Difficulty walking;Dizziness;Decreased activity tolerance  Stability/Clinical Decision Making Evolving/Moderate complexity  Rehab Potential Good  PT Frequency 2x / week (12 visits over 8 weeks)  PT Duration 8 weeks  PT Treatment/Interventions ADLs/Self Care Home  Management;Stair training;Gait training;DME Instruction;Functional mobility training;Therapeutic activities;Therapeutic exercise;Neuromuscular re-education;Balance training;Patient/family education;Vestibular;Manual techniques;Passive range of motion  PT Next Visit Plan continue using laser - work on neck proprioception, reorientation to midline begin in sitting > standing > walking. trying with eyes closed and then back to midline(of note pt is getting impatient with these ex's so would spread them out over several visits as symptoms allow).  Incorporate head movements and visual scanning. continue to work on gait with RW and rollator for community distances (progressing to cane as able), LE and core strengthening and balance training on compliant surfaces. L ankle ROM  PT Home Exercise Plan Access Code [email or web address]  Consulted and Agree with Plan of Care Patient          Patient will benefit from skilled therapeutic intervention in order to improve the following deficits and impairments:  Abnormal gait, Decreased coordination, Decreased balance, Decreased safety awareness, Decreased strength, Difficulty walking, Dizziness, Decreased activity tolerance  Visit Diagnosis: Other abnormalities of gait and mobility  Unsteadiness on feet  Other symptoms and signs involving the nervous system  Dizziness and giddiness     Problem List Patient Active Problem List   Diagnosis Date Noted   Fall 02/05/2021   Fracture tibia/fibula, left, closed, initial encounter 02/05/2021   Cerebrovascular accident (CVA) of right thalamus ([place]) 12/31/2020   Cerebral infarction due to embolism of left cerebellar artery ([place]) 12/31/2020   Alcohol use 12/19/2020   Acute CVA (cerebrovascular accident) ([place]) 12/18/2020   Depression with anxiety 12/18/2020   Essential hypertension 12/18/2020   Isolated cervical dystonia 12/06/2016   Willow Ora, PTA, [facility] [address], [place], [place] [address] [phone] 05/02/21, 10:16 PM   Name: Nakia Golliher MRN: [id number] Date of Birth: 27-Feb-1956

## 2021-05-03 ENCOUNTER — Encounter: Payer: Self-pay | Admitting: Physical Therapy

## 2021-05-03 ENCOUNTER — Ambulatory Visit: Payer: BC Managed Care – PPO | Admitting: Physical Therapy

## 2021-05-03 ENCOUNTER — Other Ambulatory Visit: Payer: Self-pay

## 2021-05-03 DIAGNOSIS — R2681 Unsteadiness on feet: Secondary | ICD-10-CM

## 2021-05-03 DIAGNOSIS — M25672 Stiffness of left ankle, not elsewhere classified: Secondary | ICD-10-CM | POA: Diagnosis not present

## 2021-05-03 DIAGNOSIS — R42 Dizziness and giddiness: Secondary | ICD-10-CM | POA: Diagnosis not present

## 2021-05-03 DIAGNOSIS — R29818 Other symptoms and signs involving the nervous system: Secondary | ICD-10-CM | POA: Diagnosis not present

## 2021-05-03 DIAGNOSIS — R2689 Other abnormalities of gait and mobility: Secondary | ICD-10-CM | POA: Diagnosis not present

## 2021-05-03 DIAGNOSIS — R278 Other lack of coordination: Secondary | ICD-10-CM | POA: Diagnosis not present

## 2021-05-03 DIAGNOSIS — R260 Ataxic gait: Secondary | ICD-10-CM | POA: Diagnosis not present

## 2021-05-03 DIAGNOSIS — M6281 Muscle weakness (generalized): Secondary | ICD-10-CM | POA: Diagnosis not present

## 2021-05-03 DIAGNOSIS — R270 Ataxia, unspecified: Secondary | ICD-10-CM | POA: Diagnosis not present

## 2021-05-03 NOTE — Therapy (Signed)
[facility] [address] [place], [place], [address] Phone: [phone]   Fax:  [phone]  Physical Therapy Treatment  Patient Details  Name: Miguel [name] MRN: [id number] Date of Birth: 01-03-1956 Referring Provider (PT): Dr. Marcelino Scot   Encounter Date: 05/03/2021   PT End of Session - 05/03/21 0724     Visit Number 18    Number of Visits 23    Date for PT Re-Evaluation 39/03/00   per recert on 05/07/32   Authorization Type BCBS - VL 30 for PT/OT (will restart on  03/04/21)    Authorization - Visit Number 14    Authorization - Number of Visits 30    PT Start Time 0719    PT Stop Time 0076    PT Time Calculation (min) 39 min    Equipment Utilized During Treatment Gait belt    Activity Tolerance Patient tolerated treatment well    Behavior During Therapy [facility] for tasks assessed/performed             Past Medical History:  Diagnosis Date   Cervical dystonia    Depression    Erectile dysfunction due to arterial insufficiency    Functional dyspepsia    Hypertension    Pancreatitis 2005   likely secondary to alcohol use   Rosacea     Past Surgical History:  Procedure Laterality Date   no past surgery     TIBIA IM NAIL INSERTION Left 02/07/2021   Procedure: INTRAMEDULLARY (IM) NAIL TIBIAL;  Surgeon: Altamese Petrolia, MD;  Location: [facility];  Service: Orthopedics;  Laterality: Left;    There were no vitals filed for this visit.   Subjective Assessment - 05/03/21 0721     Subjective Reports dizziness is still about a steady 2/10. Still trying to coordinate an appt for the Botox. New exercises are going well.    Pertinent History [name]. history of cervical dystonia and receives Xeomin injections in the past followed by neurology service Dr. Evelena Leyden at [facility], hypertension, depression, tobacco and alcohol use    How long can you walk comfortably? farthest walk in hospital was 44' with RW.     Diagnostic tests MRI showed acute infarct left inferior posterior cerebellum.  Small acute infarct right thalamus.    Patient Stated Goals build up his legs and arms, walking again    Currently in Pain? No/denies                [facility] PT Assessment - 05/03/21 0729       Timed Up and Go Test   Normal TUG (seconds) 16.97    TUG Comments with SPC and min guard                           [facility] Adult PT Treatment/Exercise - 05/03/21 0729       Ambulation/Gait   Ambulation/Gait Yes    Ambulation/Gait Assistance 4: Min guard    Ambulation/Gait Assistance Details with cues to look ahead with gait with cane    Ambulation Distance (Feet) 230 Feet    Assistive device Straight cane    Gait Pattern Decreased weight shift to left;Step-through pattern;Ataxic    Ambulation Surface Level;Indoor    Gait velocity 14.56 seconds = 2.25 ft/sec                 Balance Exercises - 05/03/21 0751       Balance Exercises: Standing  Standing Eyes Closed Foam/compliant surface    Standing Eyes Closed Limitations on blue air ex with wide BOS 2 x 30 seconds, 2 x 5 reps head turns (up to min A for balance), 2 x 5 reps head nods    SLS with Vectors Solid surface    SLS with Vectors Limitations on level ground in // bars: alternating cone taps x6 reps B, then progressing to forward/cross body cone tap x8 reps B, cues for slowed and controlled when performing for SLS    Retro Gait 3 reps;Limitations    Retro Gait Limitations on blue mat, forwards gait and then backwards gait x3 reps each with cues for step length, no UE support    Step Over Hurdles / Cones in // bars over 4 smaller orange hurdles in forwards direction down and back x4 reps with UE support>fingertip and intermittent periods of no UE support, then performed side stepping over cones down and back x2 reps with up to min A for balance and intermittent UE support    Other Standing Exercises forwards walking down and back  x2 reps in // bars with intermittent UE support with head turns, pt with decr step length when performing               PT Education - 05/03/21 0906     Education Details encouraged pt to wear his new glasses, gradually increasing the wear time. only performing exercises at home that are on his HEP and not doing any that haven't been prescribed    Person(s) Educated Patient;Spouse    Methods Explanation    Comprehension Verbalized understanding              PT Short Term Goals - 05/03/21 0732       PT SHORT TERM GOAL #1   Title Pt will perform TUG with SPC in 16 seconds or less with supervision/min guard in order to demo decr fall risk. ALL STGS DUE 05/04/21    Baseline 16.28 seconds with min guard/min A; 16.97 seconds with SPC and min guard on 05/03/21    Time 3    Period Weeks    Status Partially Met    Target Date 05/04/21      PT SHORT TERM GOAL #2   Title Pt will undergo further gait speed assessment with cane with LTG written.    Baseline 2.71 ft/sec with RW; 2.25 ft/sec with SPC with LTG written    Time 3    Period Weeks    Status Achieved      PT SHORT TERM GOAL #3   Title Pt will ambulate at least 230' over indoor level surfaces with cane with supervision/min guard in order to demo improved household mobility.    Baseline met with min guard on 05/03/21    Time 3    Period Weeks    Status Achieved      PT SHORT TERM GOAL #4   Title Pt will improve BERG score to at least a 46/56 in order to demo decr fall risk.    Baseline 42/56    Time 3    Period Weeks    Status New               PT Long Term Goals - 05/03/21 0901       PT LONG TERM GOAL #1   Title Pt will improve gait speed with SPC to at least 2.6 ft/sec for improved gait efficiency. ALL LTGS DUE 05/25/21  Baseline 2.25 ft/sec on 05/03/21    Time 6    Period Weeks    Status Revised      PT LONG TERM GOAL #2   Title Pt will ambulate at least 200' with LRAD over outdoor unlevel surfaces  with supervision in order to demo improved community mobility.    Baseline using RW for outdoor gait    Time 6    Period Weeks    Status New      PT LONG TERM GOAL #3   Title Pt will perform TUG with SPC with supervision in 15 seconds or lesss in order to demo decr fall risk.    Baseline 04/06/21: 21.63 sec's with RW with min guard assist, 16.28 sec's with cane with up to min assist    Time 6    Period Weeks    Status New      PT LONG TERM GOAL #4   Title Pt will improve FOTO score to 57 in order to demo improved functional outcomes.    Baseline 44    Time 6    Period Weeks    Status On-going      PT LONG TERM GOAL #5   Title Pt will improve BERG score to at least a 50/56 in order to demo decr fall risk.    Baseline 42/56    Time 6    Period Weeks    Status New                   Plan - 05/03/21 0907     Clinical Impression Statement Began to check pt's STGs today. Pt met STG #2 and #3. Performed gait speed with SPC in 2.25 ft/sec (indicating a limited community ambulator). Pt able to meet indoor distance of 230' with SPC with min guard. Pt partially met STG #1 - performed TUG today in 16.97 seconds with min guard (previously was 16.28 seconds with min A at times for balance). Remainder of session focused on SLS activities,obstacle negotation, balance with head motions/vision removed. Pt tolerated session well with no dizziness, did need up to min A at times for balance.    Personal Factors and Comorbidities Comorbidity 3+;Past/Current Experience    Comorbidities Cervical dystonia, HTN, depression, anxiety.    Examination-Activity Limitations Stairs;Stand;Squat;Locomotion Level;Transfers;Bend    Examination-Participation Restrictions Community Activity;Driving;Yard Work;Occupation    Stability/Clinical Decision Making Evolving/Moderate complexity    Rehab Potential Good    PT Frequency 2x / week   12 visits over 8 weeks   PT Duration 8 weeks    PT Treatment/Interventions  ADLs/Self Care Home Management;Stair training;Gait training;DME Instruction;Functional mobility training;Therapeutic activities;Therapeutic exercise;Neuromuscular re-education;Balance training;Patient/family education;Vestibular;Manual techniques;Passive range of motion    PT Next Visit Plan continue using laser - work on neck proprioception, reorientation to midline in standing > walking. trying with eyes closed and then back to midline(of note pt is getting impatient with these ex's so would spread them out over several visits as symptoms allow).  Incorporate head movements and visual scanning. continue to work on gait with RW and rollator for community distances (progressing to cane as able), LE and core strengthening and balance training on compliant surfaces.    PT Home Exercise Plan Access Code [email or web address]    Consulted and Agree with Plan of Care Patient             Patient will benefit from skilled therapeutic intervention in order to improve the following deficits and impairments:  Abnormal gait, Decreased coordination,  Decreased balance, Decreased safety awareness, Decreased strength, Difficulty walking, Dizziness, Decreased activity tolerance  Visit Diagnosis: Other abnormalities of gait and mobility  Unsteadiness on feet  Other symptoms and signs involving the nervous system     Problem List Patient Active Problem List   Diagnosis Date Noted   Fall 02/05/2021   Fracture tibia/fibula, left, closed, initial encounter 02/05/2021   Cerebrovascular accident (CVA) of right thalamus ([place]) 12/31/2020   Cerebral infarction due to embolism of left cerebellar artery ([place]) 12/31/2020   Alcohol use 12/19/2020   Acute CVA (cerebrovascular accident) ([place]) 12/18/2020   Depression with anxiety 12/18/2020   Essential hypertension 12/18/2020   Isolated cervical dystonia 12/06/2016    Arliss Journey, PT, DPT  05/03/2021, 9:12 AM  [facility] [address] [place], [place], [address] Phone: [phone]   Fax:  [phone]  Name: Miguel [name] MRN: [id number] Date of Birth: 13-May-1956

## 2021-05-06 ENCOUNTER — Encounter: Payer: Self-pay | Admitting: Physical Therapy

## 2021-05-06 ENCOUNTER — Ambulatory Visit: Payer: BC Managed Care – PPO | Attending: Physician Assistant | Admitting: Physical Therapy

## 2021-05-06 ENCOUNTER — Other Ambulatory Visit: Payer: Self-pay

## 2021-05-06 DIAGNOSIS — R42 Dizziness and giddiness: Secondary | ICD-10-CM | POA: Insufficient documentation

## 2021-05-06 DIAGNOSIS — R29818 Other symptoms and signs involving the nervous system: Secondary | ICD-10-CM | POA: Diagnosis not present

## 2021-05-06 DIAGNOSIS — R2681 Unsteadiness on feet: Secondary | ICD-10-CM | POA: Diagnosis not present

## 2021-05-06 DIAGNOSIS — G243 Spasmodic torticollis: Secondary | ICD-10-CM | POA: Diagnosis not present

## 2021-05-06 DIAGNOSIS — R2689 Other abnormalities of gait and mobility: Secondary | ICD-10-CM | POA: Diagnosis not present

## 2021-05-06 DIAGNOSIS — R260 Ataxic gait: Secondary | ICD-10-CM | POA: Insufficient documentation

## 2021-05-06 NOTE — Therapy (Signed)
[facility] [address] [place], [place], [address] Phone: [phone]   Fax:  [phone]  Physical Therapy Treatment  Patient Details  Name: Miguel [name] MRN: [id number] Date of Birth: 11/08/1955 Referring Provider (PT): Dr. Marcelino Scot   Encounter Date: 05/06/2021   PT End of Session - 05/06/21 1201     Visit Number 19    Number of Visits 23    Date for PT Re-Evaluation 51/88/41   per recert on 02/08/05   Authorization Type BCBS (03/05/21-03/04/22)    Authorization - Visit Number 16    Authorization - Number of Visits 30    PT Start Time 0801    PT Stop Time 0845    PT Time Calculation (min) 44 min    Equipment Utilized During Treatment Gait belt    Activity Tolerance Patient tolerated treatment well    Behavior During Therapy WFL for tasks assessed/performed             Past Medical History:  Diagnosis Date   Cervical dystonia    Depression    Erectile dysfunction due to arterial insufficiency    Functional dyspepsia    Hypertension    Pancreatitis 2005   likely secondary to alcohol use   Rosacea     Past Surgical History:  Procedure Laterality Date   no past surgery     TIBIA IM NAIL INSERTION Left 02/07/2021   Procedure: INTRAMEDULLARY (IM) NAIL TIBIAL;  Surgeon: Altamese Catahoula, MD;  Location: [facility];  Service: Orthopedics;  Laterality: Left;    There were no vitals filed for this visit.   Subjective Assessment - 05/06/21 0804     Subjective Did the exercises - the tandem walking and SLS made him a bit dizzy. But did spread out his foot distance on the tandem walking and that was better.    Pertinent History [name]. history of cervical dystonia and receives Xeomin injections in the past followed by neurology service Dr. Evelena Leyden at [facility], hypertension, depression, tobacco and alcohol use    How long can you walk comfortably? farthest walk in hospital was 30' with RW.    Diagnostic  tests MRI showed acute infarct left inferior posterior cerebellum.  Small acute infarct right thalamus.    Patient Stated Goals build up his legs and arms, walking again    Currently in Pain? No/denies                               [facility] Adult PT Treatment/Exercise - 05/06/21 0810       Ambulation/Gait   Ambulation/Gait Yes    Ambulation/Gait Assistance 5: Supervision;4: Min guard    Ambulation/Gait Assistance Details into and out of session with RW, small distances between activities    Assistive device Straight cane    Gait Pattern Decreased weight shift to left;Step-through pattern;Ataxic    Ambulation Surface Level;Indoor    Gait Comments discussed incr walking at home with RW (as long as wife is there to provide supervision) such as walking to the bathroom, walking to go to the kitchen to eat vs. rolling in his wheelchair      Berg Balance Test   Sit to Stand Able to stand without using hands and stabilize independently    Standing Unsupported Able to stand safely 2 minutes    Sitting with Back Unsupported but Feet Supported on Floor or Stool Able to sit safely  and securely 2 minutes    Stand to Sit Sits safely with minimal use of hands    Transfers Able to transfer safely, minor use of hands    Standing Unsupported with Eyes Closed Able to stand 10 seconds safely    Standing Ubsupported with Feet Together Able to place feet together independently and stand 1 minute safely    From Standing, Reach Forward with Outstretched Arm Can reach confidently >25 cm (10")    From Standing Position, Pick up Object from Floor Able to pick up shoe, needs supervision    From Standing Position, Turn to Look Behind Over each Shoulder Turn sideways only but maintains balance    Turn 360 Degrees Able to turn 360 degrees safely but slowly   9.45 to R, 7.15 to L   Standing Unsupported, Alternately Place Feet on Step/Stool Able to stand independently and safely and complete 8 steps in  20 seconds    Standing Unsupported, One Foot in Front Able to plae foot ahead of the other independently and hold 30 seconds    Standing on One Leg Able to lift leg independently and hold equal to or more than 3 seconds    Total Score 48      Therapeutic Activites    Therapeutic Activities Other Therapeutic Activities    Other Therapeutic Activities discussed use of rollator for community mobility - pt reports that having skiis on his RW makes it easier. verbally discussed options to obtain one - either through [facility] or PT could ask for order request from physician to get from medical supply store. pt will think about it, but not sure if he wants to pursue a rollator at this time. reviewed HEP as pt reports tandem gait and SLS made him dizzy, discussed stopping to reset before each rep and lookign up vs. looking down the whole time and having a chair nearby for pt to take either a standing or seated rest break between each activity                 Balance Exercises - 05/06/21 0842       Balance Exercises: Standing   Standing Eyes Closed Foam/compliant surface    Standing Eyes Closed Limitations on blue air ex with wide BOS 3 x 30 seconds with min guard    Standing, One Foot on a Step Eyes open;6 inch;Limitations    Standing, One Foot on a Step Limitations with LLE on floor and RLE on 6" step, 2 x 3 reps head turns, x3 reps head nods, cues for core/glute activation during stace, repeated with RLE as stance leg - intermittent taps to bars for balance    Stepping Strategy Lateral;Foam/compliant surface;10 reps;Limitations    Stepping Strategy Limitations with LLE on blue air ex - stepping RLE off and on x10 reps with intermittent touch to bars for balance    Tandem Gait Retro;Forward;Intermittent upper extremity support    Tandem Gait Limitations on level ground in // bars with single UE support - down and back x3 reps    Other Standing Exercises on blue air ex: step up/up and down/down  x10 reps leading with each leg with intermittent taps to bars for balance                 PT Short Term Goals - 05/06/21 2334       PT SHORT TERM GOAL #1   Title Pt will perform TUG with SPC in 16 seconds or less with  supervision/min guard in order to demo decr fall risk. ALL STGS DUE 05/04/21    Baseline 16.28 seconds with min guard/min A; 16.97 seconds with SPC and min guard on 05/03/21    Time 3    Period Weeks    Status Partially Met    Target Date 05/04/21      PT SHORT TERM GOAL #2   Title Pt will undergo further gait speed assessment with cane with LTG written.    Baseline 2.71 ft/sec with RW; 2.25 ft/sec with SPC with LTG written    Time 3    Period Weeks    Status Achieved      PT SHORT TERM GOAL #3   Title Pt will ambulate at least 230' over indoor level surfaces with cane with supervision/min guard in order to demo improved household mobility.    Baseline met with min guard on 05/03/21    Time 3    Period Weeks    Status Achieved      PT SHORT TERM GOAL #4   Title Pt will improve BERG score to at least a 46/56 in order to demo decr fall risk.    Baseline 42/56; 48/56    Time 3    Period Weeks    Status Achieved               PT Long Term Goals - 05/06/21 1201       PT LONG TERM GOAL #1   Title Pt will improve gait speed with SPC to at least 2.6 ft/sec for improved gait efficiency. ALL LTGS DUE 05/25/21    Baseline 2.25 ft/sec on 05/03/21    Time 6    Period Weeks    Status Revised      PT LONG TERM GOAL #2   Title Pt will ambulate at least 200' with LRAD over outdoor unlevel surfaces with supervision in order to demo improved community mobility.    Baseline using RW for outdoor gait    Time 6    Period Weeks    Status New      PT LONG TERM GOAL #3   Title Pt will perform TUG with SPC with supervision in 15 seconds or lesss in order to demo decr fall risk.    Baseline 04/06/21: 21.63 sec's with RW with min guard assist, 16.28 sec's with cane  with up to min assist    Time 6    Period Weeks    Status New      PT LONG TERM GOAL #4   Title Pt will improve FOTO score to 57 in order to demo improved functional outcomes.    Baseline 44    Time 6    Period Weeks    Status On-going      PT LONG TERM GOAL #5   Title Pt will improve BERG score to at least a 50/56 in order to demo decr fall risk.    Baseline 42/56    Time 6    Period Weeks    Status New                   Plan - 05/06/21 1204     Clinical Impression Statement Pt met STG #4 in regards to BERG - improved to a 48/56 (previously was 42/56). Discussed increasing walking at home with RW as long as spouse is there to provider supervision. Continued to focus on balance strategies with SLS tasks, compliant surfaces, and vision removed. Pt  tolerated session well, did need close min guard and use of // bars at times for balance activities. Will continue to progress towards LTGs.    Personal Factors and Comorbidities Comorbidity 3+;Past/Current Experience    Comorbidities Cervical dystonia, HTN, depression, anxiety.    Examination-Activity Limitations Stairs;Stand;Squat;Locomotion Level;Transfers;Bend    Examination-Participation Restrictions Community Activity;Driving;Yard Work;Occupation    Stability/Clinical Decision Making Evolving/Moderate complexity    Rehab Potential Good    PT Frequency 2x / week   12 visits over 8 weeks   PT Duration 8 weeks    PT Treatment/Interventions ADLs/Self Care Home Management;Stair training;Gait training;DME Instruction;Functional mobility training;Therapeutic activities;Therapeutic exercise;Neuromuscular re-education;Balance training;Patient/family education;Vestibular;Manual techniques;Passive range of motion    PT Next Visit Plan pt has already used 16 visits out of 30 - will need to discuss this and check with front, and will need to talk about POC and dropping down to 1x a week. continue using laser for proprioception in standing,  continue reorientation to midline in standing>walking.  Incorporate head movements and visual scanning. continue to work on gait with RW and rollator for community distances (progressing to cane as able), LE and core strengthening and balance training on compliant surfaces.    PT Home Exercise Plan Access Code [email or web address]    Consulted and Agree with Plan of Care Patient             Patient will benefit from skilled therapeutic intervention in order to improve the following deficits and impairments:  Abnormal gait, Decreased coordination, Decreased balance, Decreased safety awareness, Decreased strength, Difficulty walking, Dizziness, Decreased activity tolerance  Visit Diagnosis: Other abnormalities of gait and mobility  Unsteadiness on feet  Dizziness and giddiness  Other symptoms and signs involving the nervous system     Problem List Patient Active Problem List   Diagnosis Date Noted   Fall 02/05/2021   Fracture tibia/fibula, left, closed, initial encounter 02/05/2021   Cerebrovascular accident (CVA) of right thalamus ([place]) 12/31/2020   Cerebral infarction due to embolism of left cerebellar artery ([place]) 12/31/2020   Alcohol use 12/19/2020   Acute CVA (cerebrovascular accident) ([place]) 12/18/2020   Depression with anxiety 12/18/2020   Essential hypertension 12/18/2020   Isolated cervical dystonia 12/06/2016    Arliss Journey, PT, DPT  05/06/2021, 12:07 PM  [facility] [address] [place], [place], [address] Phone: [phone]   Fax:  [phone]  Name: Miguel [name] MRN: [id number] Date of Birth: October 10, 1955

## 2021-05-11 ENCOUNTER — Ambulatory Visit: Payer: BC Managed Care – PPO | Admitting: Physical Therapy

## 2021-05-11 DIAGNOSIS — S82202A Unspecified fracture of shaft of left tibia, initial encounter for closed fracture: Secondary | ICD-10-CM | POA: Diagnosis not present

## 2021-05-14 ENCOUNTER — Other Ambulatory Visit (HOSPITAL_COMMUNITY): Payer: Self-pay

## 2021-05-14 ENCOUNTER — Other Ambulatory Visit: Payer: Self-pay

## 2021-05-14 ENCOUNTER — Encounter: Payer: Self-pay | Admitting: Physical Therapy

## 2021-05-14 ENCOUNTER — Ambulatory Visit: Payer: BC Managed Care – PPO | Admitting: Physical Therapy

## 2021-05-14 DIAGNOSIS — R2689 Other abnormalities of gait and mobility: Secondary | ICD-10-CM | POA: Diagnosis not present

## 2021-05-14 DIAGNOSIS — R42 Dizziness and giddiness: Secondary | ICD-10-CM

## 2021-05-14 DIAGNOSIS — R260 Ataxic gait: Secondary | ICD-10-CM | POA: Diagnosis not present

## 2021-05-14 DIAGNOSIS — R2681 Unsteadiness on feet: Secondary | ICD-10-CM

## 2021-05-14 DIAGNOSIS — R29818 Other symptoms and signs involving the nervous system: Secondary | ICD-10-CM

## 2021-05-14 NOTE — Therapy (Signed)
[facility] [address] [place], [place], [address] Phone: [phone]   Fax:  [phone]  Physical Therapy Treatment  Patient Details  Name: Miguel [name] MRN: [id number] Date of Birth: 1955/10/17 Referring Provider (PT): Dr. Marcelino Scot   Encounter Date: 05/14/2021   PT End of Session - 05/14/21 1017     Visit Number 20    Number of Visits 23    Date for PT Re-Evaluation 98/26/41   per recert on 01/10/29   Authorization Type BCBS (03/05/21-03/04/22)    Authorization - Visit Number 17    Authorization - Number of Visits 30    PT Start Time 0717    PT Stop Time 0801    PT Time Calculation (min) 44 min    Equipment Utilized During Treatment Gait belt    Activity Tolerance Patient tolerated treatment well    Behavior During Therapy WFL for tasks assessed/performed             Past Medical History:  Diagnosis Date   Cervical dystonia    Depression    Erectile dysfunction due to arterial insufficiency    Functional dyspepsia    Hypertension    Pancreatitis 2005   likely secondary to alcohol use   Rosacea     Past Surgical History:  Procedure Laterality Date   no past surgery     TIBIA IM NAIL INSERTION Left 02/07/2021   Procedure: INTRAMEDULLARY (IM) NAIL TIBIAL;  Surgeon: Altamese Clarence, MD;  Location: [facility];  Service: Orthopedics;  Laterality: Left;    There were no vitals filed for this visit.   Subjective Assessment - 05/14/21 0720     Subjective Got to the botox last week, neck is feeling looser today. Has been doing more walking at home with the RW with his wife. Went to [employer] last week and it was a lot of stimulation - didn't make him too dizzy.    Pertinent History [name]. history of cervical dystonia and receives Xeomin injections in the past followed by neurology service Dr. Evelena Leyden at [facility], hypertension, depression, tobacco and alcohol use    How long can you walk  comfortably? farthest walk in hospital was 10' with RW.    Diagnostic tests MRI showed acute infarct left inferior posterior cerebellum.  Small acute infarct right thalamus.    Patient Stated Goals build up his legs and arms, walking again    Currently in Pain? Yes    Pain Score 3     Pain Location Knee   feels like sciatic pain down the right leg   Pain Orientation Right    Pain Descriptors / Indicators Aching;Sore   when on it   Pain Type Acute pain                               [facility] Adult PT Treatment/Exercise - 05/14/21 1019       Ambulation/Gait   Ambulation/Gait Yes    Ambulation/Gait Assistance 4: Min guard;5: Supervision    Ambulation/Gait Assistance Details with RW x2 laps working on scanning environment and head motions, one episode of needing close min guard due to pt getting outside of his RW, smaller distance gait between activities with [facility]    Ambulation Distance (Feet) 230 Feet    Assistive device Straight cane;Rolling walker    Gait Pattern Decreased weight shift to left;Step-through pattern;Ataxic    Ambulation Surface  Level;Indoor      Therapeutic Activites    Therapeutic Activities Other Therapeutic Activities    Other Therapeutic Activities adding R cervical side bending stretch to perform at home for 20-30 seconds after recently receiving botox      Neuro Re-ed    Neuro Re-ed Details  with laser on pt's forehead for cervical proprioception after recent botox injection, pt standing at edge of mat table with feet hip width and with RUE out towards chair on R to promote midline alignment and tactile cues as needed from therapist, performed head turns to R and back to middle x5 reps, then repeated with pt closing his eyes and opening when head was back at midline x7 reps - still with tendency to open eyes when on L side of target, then performed holding on R side and nodding head down and then back up to target x5 reps, performed with eyes closed x7  reps with pt better able to find target                 Balance Exercises - 05/14/21 0001       Balance Exercises: Standing   Standing Eyes Opened Foam/compliant surface;Narrow base of support (BOS)    Standing Eyes Opened Limitations with more narrow BOS x10 reps head turns, x10 reps head nods    Standing Eyes Closed Narrow base of support (BOS);Wide (BOA);Limitations    Standing Eyes Closed Limitations on level surface with feet together: x30 seconds, x10 reps head turns, x10 reps head nods. on blue air ex with feet hip width 3 x 30 seconds with min guard for balance and mild postural sway with intermittent taps to bars for balance                PT Education - 05/14/21 1018     Education Details scheduling more appts (2x week for 4 weeks followed by 1x week for 4 weeks in order to conserve pt's visit limit before he gets medicare in December). continuing to ambulate at home (incr time walking vs. spending time in w/c) with wife supervision    Person(s) Educated Patient;Spouse    Methods Explanation    Comprehension Verbalized understanding              PT Short Term Goals - 05/06/21 0821       PT SHORT TERM GOAL #1   Title Pt will perform TUG with SPC in 16 seconds or less with supervision/min guard in order to demo decr fall risk. ALL STGS DUE 05/04/21    Baseline 16.28 seconds with min guard/min A; 16.97 seconds with SPC and min guard on 05/03/21    Time 3    Period Weeks    Status Partially Met    Target Date 05/04/21      PT SHORT TERM GOAL #2   Title Pt will undergo further gait speed assessment with cane with LTG written.    Baseline 2.71 ft/sec with RW; 2.25 ft/sec with SPC with LTG written    Time 3    Period Weeks    Status Achieved      PT SHORT TERM GOAL #3   Title Pt will ambulate at least 230' over indoor level surfaces with cane with supervision/min guard in order to demo improved household mobility.    Baseline met with min guard on 05/03/21     Time 3    Period Weeks    Status Achieved      PT  SHORT TERM GOAL #4   Title Pt will improve BERG score to at least a 46/56 in order to demo decr fall risk.    Baseline 42/56; 48/56    Time 3    Period Weeks    Status Achieved               PT Long Term Goals - 05/06/21 1201       PT LONG TERM GOAL #1   Title Pt will improve gait speed with SPC to at least 2.6 ft/sec for improved gait efficiency. ALL LTGS DUE 05/25/21    Baseline 2.25 ft/sec on 05/03/21    Time 6    Period Weeks    Status Revised      PT LONG TERM GOAL #2   Title Pt will ambulate at least 200' with LRAD over outdoor unlevel surfaces with supervision in order to demo improved community mobility.    Baseline using RW for outdoor gait    Time 6    Period Weeks    Status New      PT LONG TERM GOAL #3   Title Pt will perform TUG with SPC with supervision in 15 seconds or lesss in order to demo decr fall risk.    Baseline 04/06/21: 21.63 sec's with RW with min guard assist, 16.28 sec's with cane with up to min assist    Time 6    Period Weeks    Status New      PT LONG TERM GOAL #4   Title Pt will improve FOTO score to 57 in order to demo improved functional outcomes.    Baseline 44    Time 6    Period Weeks    Status On-going      PT LONG TERM GOAL #5   Title Pt will improve BERG score to at least a 50/56 in order to demo decr fall risk.    Baseline 42/56    Time 6    Period Weeks    Status New                   Plan - 05/14/21 1027     Clinical Impression Statement Worked on cervical proprioception today in standing after recent botox injections with focus on neutral positioning and head motions to the [employer] eyes closed with pt finding midline, still with difficulty going from R>midline. Pt better with finding midline target with eyes closed when head already turned to R (did improve with incr reps). Remainder of session focused on dynamic balance with head motions and with  vision removed/head turns with narrow BOS and on foam. Pt tolerated session well, will continue to progress towards LTGs.    Personal Factors and Comorbidities Comorbidity 3+;Past/Current Experience    Comorbidities Cervical dystonia, HTN, depression, anxiety.    Examination-Activity Limitations Stairs;Stand;Squat;Locomotion Level;Transfers;Bend    Examination-Participation Restrictions Community Activity;Driving;Yard Work;Occupation    Stability/Clinical Decision Making Evolving/Moderate complexity    Rehab Potential Good    PT Frequency 2x / week   12 visits over 8 weeks   PT Duration 8 weeks    PT Treatment/Interventions ADLs/Self Care Home Management;Stair training;Gait training;DME Instruction;Functional mobility training;Therapeutic activities;Therapeutic exercise;Neuromuscular re-education;Balance training;Patient/family education;Vestibular;Manual techniques;Passive range of motion    PT Next Visit Plan continue using laser for proprioception in standing with eyes closed, continue reorientation to midline in standing>walking.  gait/balance with head movements. continue to work on gait with cane. balance on compliant surfaces and incr vestibular input. add  any additional neck stretches    PT Home Exercise Plan Access Code [email or web address]    Consulted and Agree with Plan of Care Patient             Patient will benefit from skilled therapeutic intervention in order to improve the following deficits and impairments:  Abnormal gait, Decreased coordination, Decreased balance, Decreased safety awareness, Decreased strength, Difficulty walking, Dizziness, Decreased activity tolerance  Visit Diagnosis: Other abnormalities of gait and mobility  Unsteadiness on feet  Dizziness and giddiness  Other symptoms and signs involving the nervous system     Problem List Patient Active Problem List   Diagnosis Date Noted   Fall 02/05/2021   Fracture tibia/fibula, left, closed, initial encounter  02/05/2021   Cerebrovascular accident (CVA) of right thalamus ([place]) 12/31/2020   Cerebral infarction due to embolism of left cerebellar artery ([place]) 12/31/2020   Alcohol use 12/19/2020   Acute CVA (cerebrovascular accident) ([place]) 12/18/2020   Depression with anxiety 12/18/2020   Essential hypertension 12/18/2020   Isolated cervical dystonia 12/06/2016    Miguel [name], PT, DPT  05/14/2021, 10:31 AM  [facility] [address] [place], [place], [address] Phone: [phone]   Fax:  [phone]  Name: Miguel [name] MRN: [id number] Date of Birth: 10/04/1955

## 2021-05-17 ENCOUNTER — Ambulatory Visit: Payer: BC Managed Care – PPO | Admitting: Physical Therapy

## 2021-05-17 ENCOUNTER — Encounter: Payer: Self-pay | Admitting: Physical Therapy

## 2021-05-17 ENCOUNTER — Other Ambulatory Visit: Payer: Self-pay

## 2021-05-17 ENCOUNTER — Other Ambulatory Visit: Payer: Self-pay | Admitting: Adult Health

## 2021-05-17 VITALS — BP 131/89 | HR 95

## 2021-05-17 DIAGNOSIS — R2681 Unsteadiness on feet: Secondary | ICD-10-CM | POA: Diagnosis not present

## 2021-05-17 DIAGNOSIS — R260 Ataxic gait: Secondary | ICD-10-CM | POA: Diagnosis not present

## 2021-05-17 DIAGNOSIS — I7774 Dissection of vertebral artery: Secondary | ICD-10-CM

## 2021-05-17 DIAGNOSIS — I6502 Occlusion and stenosis of left vertebral artery: Secondary | ICD-10-CM

## 2021-05-17 DIAGNOSIS — I6523 Occlusion and stenosis of bilateral carotid arteries: Secondary | ICD-10-CM

## 2021-05-17 DIAGNOSIS — R42 Dizziness and giddiness: Secondary | ICD-10-CM | POA: Diagnosis not present

## 2021-05-17 DIAGNOSIS — R2689 Other abnormalities of gait and mobility: Secondary | ICD-10-CM

## 2021-05-17 DIAGNOSIS — R29818 Other symptoms and signs involving the nervous system: Secondary | ICD-10-CM

## 2021-05-17 DIAGNOSIS — I639 Cerebral infarction, unspecified: Secondary | ICD-10-CM

## 2021-05-17 DIAGNOSIS — I6381 Other cerebral infarction due to occlusion or stenosis of small artery: Secondary | ICD-10-CM

## 2021-05-17 NOTE — Therapy (Signed)
[facility] [name] [place], [place], [address] Phone: [phone]   Fax:  [phone]  Physical Therapy Treatment  Patient Details  Name: Miguel [name] MRN: [id number] Date of Birth: 12-02-1955 Referring Provider (PT): Dr. Marcelino Scot   Encounter Date: 05/17/2021   PT End of Session - 05/17/21 1134     Visit Number 21    Number of Visits 23    Date for PT Re-Evaluation 44/81/85   per recert on 02/05/13   Authorization Type BCBS (03/05/21-03/04/22)    Authorization - Visit Number 18    Authorization - Number of Visits 30    PT Start Time 0717    PT Stop Time 0759    PT Time Calculation (min) 42 min    Equipment Utilized During Treatment Gait belt    Activity Tolerance Patient tolerated treatment well    Behavior During Therapy WFL for tasks assessed/performed             Past Medical History:  Diagnosis Date   Cervical dystonia    Depression    Erectile dysfunction due to arterial insufficiency    Functional dyspepsia    Hypertension    Pancreatitis 2005   likely secondary to alcohol use   Rosacea     Past Surgical History:  Procedure Laterality Date   no past surgery     TIBIA IM NAIL INSERTION Left 02/07/2021   Procedure: INTRAMEDULLARY (IM) NAIL TIBIAL;  Surgeon: Altamese Athens, MD;  Location: [name];  Service: Orthopedics;  Laterality: Left;    Vitals:   05/17/21 0722  BP: 131/89  Pulse: 95     Subjective Assessment - 05/17/21 0719     Subjective Dizziness started yesterday and he needed to sit and rest. Currently looking down is what makes it worse. Was looking down at where he needs to put his feet. Reports it is a little bit better this morning after he has eaten.    Pertinent History [name]. history of cervical dystonia and receives Xeomin injections in the past followed by neurology service Dr. Evelena Leyden at [facility], hypertension, depression, tobacco and alcohol use    How  long can you walk comfortably? farthest walk in hospital was 97' with RW.    Diagnostic tests MRI showed acute infarct left inferior posterior cerebellum.  Small acute infarct right thalamus.    Patient Stated Goals build up his legs and arms, walking again    Currently in Pain? No/denies                               [facility] Adult PT Treatment/Exercise - 05/17/21 0001       Ambulation/Gait   Ambulation/Gait Yes    Ambulation/Gait Assistance 4: Min guard    Ambulation/Gait Assistance Details small distance gait between activities, 2 x 30'    Assistive device Straight cane    Gait Pattern Decreased weight shift to left;Step-through pattern;Ataxic    Ambulation Surface Level;Indoor      Neuro Re-ed    Neuro Re-ed Details  with laser on pt's forehead for cervical proprioception and due to pt's recent reports of dizziness (esp when looking down): seated with reaching out to bolster on R for midline orientation, performed head nods from midline <> down to floor x5 reps, then performed with eyes closed x5 reps. in standing with pt with wide BOS and reaching out towards R, performed looking  up <> down to floor x6 reps, then performed with eyes closed looking down > back to midline. therapist assisting with proper midline positioning throughout. with pt holding on to RW, performed x10 reps alternating stepping forward while trying to keep eyes on the target, incr difficulty with stepping added. no dizziness throughout                 Balance Exercises - 05/17/21 0757       Balance Exercises: Standing   Standing, One Foot on a Step Limitations;4 inch;Eyes open    Standing, One Foot on a Step Limitations with LLE on floor and RLE on step for modified SLS 2 x 20 seconds bilat - with intermittent taps to bars for balance, performed x2 reps with x3 reps head nods, no dizziness noted, pt reporting just feeling more unstready                  PT Short Term Goals -  05/06/21 1856       PT SHORT TERM GOAL #1   Title Pt will perform TUG with SPC in 16 seconds or less with supervision/min guard in order to demo decr fall risk. ALL STGS DUE 05/04/21    Baseline 16.28 seconds with min guard/min A; 16.97 seconds with SPC and min guard on 05/03/21    Time 3    Period Weeks    Status Partially Met    Target Date 05/04/21      PT SHORT TERM GOAL #2   Title Pt will undergo further gait speed assessment with cane with LTG written.    Baseline 2.71 ft/sec with RW; 2.25 ft/sec with SPC with LTG written    Time 3    Period Weeks    Status Achieved      PT SHORT TERM GOAL #3   Title Pt will ambulate at least 230' over indoor level surfaces with cane with supervision/min guard in order to demo improved household mobility.    Baseline met with min guard on 05/03/21    Time 3    Period Weeks    Status Achieved      PT SHORT TERM GOAL #4   Title Pt will improve BERG score to at least a 46/56 in order to demo decr fall risk.    Baseline 42/56; 48/56    Time 3    Period Weeks    Status Achieved               PT Long Term Goals - 05/06/21 1201       PT LONG TERM GOAL #1   Title Pt will improve gait speed with SPC to at least 2.6 ft/sec for improved gait efficiency. ALL LTGS DUE 05/25/21    Baseline 2.25 ft/sec on 05/03/21    Time 6    Period Weeks    Status Revised      PT LONG TERM GOAL #2   Title Pt will ambulate at least 200' with LRAD over outdoor unlevel surfaces with supervision in order to demo improved community mobility.    Baseline using RW for outdoor gait    Time 6    Period Weeks    Status New      PT LONG TERM GOAL #3   Title Pt will perform TUG with SPC with supervision in 15 seconds or lesss in order to demo decr fall risk.    Baseline 04/06/21: 21.63 sec's with RW with min guard assist, 16.28 sec's  with cane with up to min assist    Time 6    Period Weeks    Status New      PT LONG TERM GOAL #4   Title Pt will improve FOTO  score to 57 in order to demo improved functional outcomes.    Baseline 44    Time 6    Period Weeks    Status On-going      PT LONG TERM GOAL #5   Title Pt will improve BERG score to at least a 50/56 in order to demo decr fall risk.    Baseline 42/56    Time 6    Period Weeks    Status New                   Plan - 05/17/21 1138     Clinical Impression Statement Pt with a few episodes of dizziness over the weekend, especially when looking down. Continued to use laser today for cervical proprioception in sitting and standing working on looking up and down to the ground while maintaining midline positioning. Transitioned to keeping head in midline and alternating taking steps, with pt having more difficulty with this. Pt reporting that dizziness felt better after cervical proprioception exercises. Remaining time spent on SLS tasks with no incr in dizziness noted. Will continue to progress towards LTGs.    Personal Factors and Comorbidities Comorbidity 3+;Past/Current Experience    Comorbidities Cervical dystonia, HTN, depression, anxiety.    Examination-Activity Limitations Stairs;Stand;Squat;Locomotion Level;Transfers;Bend    Examination-Participation Restrictions Community Activity;Driving;Yard Work;Occupation    Stability/Clinical Decision Making Evolving/Moderate complexity    Rehab Potential Good    PT Frequency 2x / week   12 visits over 8 weeks   PT Duration 8 weeks    PT Treatment/Interventions ADLs/Self Care Home Management;Stair training;Gait training;DME Instruction;Functional mobility training;Therapeutic activities;Therapeutic exercise;Neuromuscular re-education;Balance training;Patient/family education;Vestibular;Manual techniques;Passive range of motion    PT Next Visit Plan how is dizziness? pt will need re-cert at end of next week. continue using laser for proprioception in standing with eyes closed and up/down motions (this still tends to give pt the most  dizziness), midline orientation for stepping/walking.   gait/balance with head movements. continue to work on gait with cane. balance on compliant surfaces and incr vestibular input. add any additional neck stretches    PT Home Exercise Plan Access Code [email or web address]    Consulted and Agree with Plan of Care Patient             Patient will benefit from skilled therapeutic intervention in order to improve the following deficits and impairments:  Abnormal gait, Decreased coordination, Decreased balance, Decreased safety awareness, Decreased strength, Difficulty walking, Dizziness, Decreased activity tolerance  Visit Diagnosis: Other abnormalities of gait and mobility  Unsteadiness on feet  Dizziness and giddiness  Other symptoms and signs involving the nervous system     Problem List Patient Active Problem List   Diagnosis Date Noted   Fall 02/05/2021   Fracture tibia/fibula, left, closed, initial encounter 02/05/2021   Cerebrovascular accident (CVA) of right thalamus (HCC) 12/31/2020   Cerebral infarction due to embolism of left cerebellar artery (HCC) 12/31/2020   Alcohol use 12/19/2020   Acute CVA (cerebrovascular accident) (HCC) 12/18/2020   Depression with anxiety 12/18/2020   Essential hypertension 12/18/2020   Isolated cervical dystonia 12/06/2016    Drake Leach, PT, DPT 05/17/2021, 11:42 AM  [facility] [address] [place], [place], [address] Phone: [phone]  Fax:  [phone]  Name: Miguel [name] MRN: [id number] Date of Birth: 08-20-1956

## 2021-05-20 ENCOUNTER — Ambulatory Visit: Payer: BC Managed Care – PPO | Admitting: Physical Therapy

## 2021-05-20 ENCOUNTER — Encounter: Payer: Self-pay | Admitting: Physical Therapy

## 2021-05-20 ENCOUNTER — Other Ambulatory Visit: Payer: Self-pay

## 2021-05-20 VITALS — BP 148/97 | HR 88

## 2021-05-20 DIAGNOSIS — R260 Ataxic gait: Secondary | ICD-10-CM | POA: Diagnosis not present

## 2021-05-20 DIAGNOSIS — R2681 Unsteadiness on feet: Secondary | ICD-10-CM

## 2021-05-20 DIAGNOSIS — R2689 Other abnormalities of gait and mobility: Secondary | ICD-10-CM

## 2021-05-20 DIAGNOSIS — R42 Dizziness and giddiness: Secondary | ICD-10-CM | POA: Diagnosis not present

## 2021-05-20 DIAGNOSIS — R29818 Other symptoms and signs involving the nervous system: Secondary | ICD-10-CM | POA: Diagnosis not present

## 2021-05-20 NOTE — Therapy (Signed)
[facility] [address] [place], [place], [address] Phone: [phone]   Fax:  [phone]  Physical Therapy Treatment  Patient Details  Name: Janssen Zee MRN: [id number] Date of Birth: 1956/06/18 Referring Provider (PT): Dr. Carola Frost   Encounter Date: 05/20/2021   PT End of Session - 05/20/21 0726     Visit Number 22    Number of Visits 23    Date for PT Re-Evaluation 06/08/21   per recert on 04/06/21   Authorization Type BCBS (03/05/21-03/04/22)    Authorization - Visit Number 19    Authorization - Number of Visits 30    PT Start Time 0718    PT Stop Time 0800    PT Time Calculation (min) 42 min    Equipment Utilized During Treatment Gait belt    Activity Tolerance Patient tolerated treatment well    Behavior During Therapy WFL for tasks assessed/performed             Past Medical History:  Diagnosis Date   Cervical dystonia    Depression    Erectile dysfunction due to arterial insufficiency    Functional dyspepsia    Hypertension    Pancreatitis 2005   likely secondary to alcohol use   Rosacea     Past Surgical History:  Procedure Laterality Date   no past surgery     TIBIA IM NAIL INSERTION Left 02/07/2021   Procedure: INTRAMEDULLARY (IM) NAIL TIBIAL;  Surgeon: Myrene Galas, MD;  Location: [facility];  Service: Orthopedics;  Laterality: Left;    Vitals:   05/20/21 0724 05/20/21 0758  BP: 140/88 (!) 148/97  Pulse: 89 88     Subjective Assessment - 05/20/21 0723     Subjective Still having dizziness at times, has to sit down to allow it to resolve. Reports his BP has been running either really high or really low as well. States he is not taking anything for BP at this time.    Pertinent History [name]. history of cervical dystonia and receives Xeomin injections in the past followed by neurology service Dr. Boris Sharper at [facility], hypertension, depression, tobacco and alcohol use    How  long can you walk comfortably? farthest walk in hospital was 54' with RW.    Diagnostic tests MRI showed acute infarct left inferior posterior cerebellum.  Small acute infarct right thalamus.    Patient Stated Goals build up his legs and arms, walking again    Currently in Pain? Yes    Pain Score 4     Pain Location Back   Sciatic pain   Pain Orientation Right    Pain Descriptors / Indicators Aching;Sore    Pain Type Acute pain;Chronic pain    Pain Radiating Towards down righy leg from hip to knee    Pain Onset More than a month ago    Pain Frequency Constant    Aggravating Factors  recent fracture (started after most rescent hospital stay)    Pain Relieving Factors rest, tylenol, stretching                     [facility] Adult PT Treatment/Exercise - 05/20/21 0730       Transfers   Transfers Sit to Stand;Stand to Sit    Sit to Stand 5: Supervision;With upper extremity assist;4: Min guard    Stand to Sit 5: Supervision;With upper extremity assist;To bed;To chair/3-in-1      Ambulation/Gait   Ambulation/Gait Yes  Ambulation/Gait Assistance 5: Supervision;4: Min guard    Ambulation Distance (Feet) --   around clinic with session   Assistive device Rolling walker    Gait Pattern Decreased weight shift to left;Step-through pattern;Ataxic    Ambulation Surface Level;Indoor      Berg Balance Test   Sit to Stand Able to stand without using hands and stabilize independently    Standing Unsupported Able to stand safely 2 minutes    Sitting with Back Unsupported but Feet Supported on Floor or Stool Able to sit safely and securely 2 minutes    Stand to Sit Sits safely with minimal use of hands    Transfers Able to transfer safely, minor use of hands    Standing Unsupported with Eyes Closed Able to stand 10 seconds safely    Standing Ubsupported with Feet Together Able to place feet together independently and stand 1 minute safely    From Standing, Reach Forward with Outstretched  Arm Can reach confidently >25 cm (10")    From Standing Position, Pick up Object from Floor Able to pick up shoe, needs supervision    From Standing Position, Turn to Look Behind Over each Shoulder Looks behind one side only/other side shows less weight shift   right>left side   Turn 360 Degrees Able to turn 360 degrees safely but slowly   >9 sec's both ways   Standing Unsupported, Alternately Place Feet on Step/Stool Able to stand independently and safely and complete 8 steps in 20 seconds   15.31 sec's   Standing Unsupported, One Foot in Front Able to plae foot ahead of the other independently and hold 30 seconds    Standing on One Leg Able to lift leg independently and hold equal to or more than 3 seconds    Total Score 49      Knee/Hip Exercises: Stretches   Passive Hamstring Stretch Right;2 reps;30 seconds;Limitations    Passive Hamstring Stretch Limitations pt with notable tremors/spasms with passive hamstring stretching    Other Knee/Hip Stretches single knee to chest stretch 30 sec's x 3 reps    Other Knee/Hip Stretches hook lying right LE nerve glides with PTA supporting right LE in 90* hip, ~100* knee flexion and pt self moving ankle into DF/PF for nerve glide. pt performed 2 sets of 5 reps.                  PT Short Term Goals - 05/06/21 1856       PT SHORT TERM GOAL #1   Title Pt will perform TUG with SPC in 16 seconds or less with supervision/min guard in order to demo decr fall risk. ALL STGS DUE 05/04/21    Baseline 16.28 seconds with min guard/min A; 16.97 seconds with SPC and min guard on 05/03/21    Time 3    Period Weeks    Status Partially Met    Target Date 05/04/21      PT SHORT TERM GOAL #2   Title Pt will undergo further gait speed assessment with cane with LTG written.    Baseline 2.71 ft/sec with RW; 2.25 ft/sec with SPC with LTG written    Time 3    Period Weeks    Status Achieved      PT SHORT TERM GOAL #3   Title Pt will ambulate at least 230'  over indoor level surfaces with cane with supervision/min guard in order to demo improved household mobility.    Baseline met with min guard  on 05/03/21    Time 3    Period Weeks    Status Achieved      PT SHORT TERM GOAL #4   Title Pt will improve BERG score to at least a 46/56 in order to demo decr fall risk.    Baseline 42/56; 48/56    Time 3    Period Weeks    Status Achieved               PT Long Term Goals - 05/20/21 0729       PT LONG TERM GOAL #1   Title Pt will improve gait speed with SPC to at least 2.6 ft/sec for improved gait efficiency. ALL LTGS DUE 05/25/21    Baseline 2.25 ft/sec on 05/03/21    Time 6    Period Weeks    Status On-going      PT LONG TERM GOAL #2   Title Pt will ambulate at least 200' with LRAD over outdoor unlevel surfaces with supervision in order to demo improved community mobility.    Baseline using RW for outdoor gait    Time 6    Period Weeks    Status On-going      PT LONG TERM GOAL #3   Title Pt will perform TUG with SPC with supervision in 15 seconds or lesss in order to demo decr fall risk.    Baseline 04/06/21: 21.63 sec's with RW with min guard assist, 16.28 sec's with cane with up to min assist    Time 6    Period Weeks    Status On-going      PT LONG TERM GOAL #4   Title Pt will improve FOTO score to 57 in order to demo improved functional outcomes.    Baseline 44    Time 6    Period Weeks    Status On-going      PT LONG TERM GOAL #5   Title Pt will improve BERG score to at least a 50/56 in order to demo decr fall risk.    Baseline 05/10/21: 49/56 scored today, improved from 42/56 at last assessment, just not to goal    Time --    Period --    Status Partially Met                   Plan - 05/20/21 0728     Clinical Impression Statement Today's skilled session initially focused on stretches to address sciatic pain and right LE tightness. Did not use laser this session as pt reports he feels it is not helping  him at all. Remainder of session began to address progress toward LTGs for anticipated re-cert at end of this cert on 04/14/16. Pt did show progress with Berg Balance test to 49/56, however not to goal level. Will plan to check remaining goals at next session.    Personal Factors and Comorbidities Comorbidity 3+;Past/Current Experience    Comorbidities Cervical dystonia, HTN, depression, anxiety.    Examination-Activity Limitations Stairs;Stand;Squat;Locomotion Level;Transfers;Bend    Examination-Participation Restrictions Community Activity;Driving;Yard Work;Occupation    Stability/Clinical Decision Making Evolving/Moderate complexity    Rehab Potential Good    PT Frequency 2x / week   12 visits over 8 weeks   PT Duration 8 weeks    PT Treatment/Interventions ADLs/Self Care Home Management;Stair training;Gait training;DME Instruction;Functional mobility training;Therapeutic activities;Therapeutic exercise;Neuromuscular re-education;Balance training;Patient/family education;Vestibular;Manual techniques;Passive range of motion    PT Next Visit Plan how is dizziness? check remainder of LTGs for recert. if  pt agrees- continue using laser for proprioception in standing with eyes closed and up/down motions (this still tends to give pt the most dizziness), midline orientation for stepping/walking.   gait/balance with head movements. continue to work on gait with cane. balance on compliant surfaces and incr vestibular input. add any additional neck stretches    PT Home Exercise Plan Access Code [email or web address]    Consulted and Agree with Plan of Care Patient    Family Member Consulted pt's wife             Patient will benefit from skilled therapeutic intervention in order to improve the following deficits and impairments:  Abnormal gait, Decreased coordination, Decreased balance, Decreased safety awareness, Decreased strength, Difficulty walking, Dizziness, Decreased activity tolerance  Visit  Diagnosis: Other abnormalities of gait and mobility  Unsteadiness on feet  Dizziness and giddiness     Problem List Patient Active Problem List   Diagnosis Date Noted   Fall 02/05/2021   Fracture tibia/fibula, left, closed, initial encounter 02/05/2021   Cerebrovascular accident (CVA) of right thalamus ([place]) 12/31/2020   Cerebral infarction due to embolism of left cerebellar artery ([place]) 12/31/2020   Alcohol use 12/19/2020   Acute CVA (cerebrovascular accident) ([place]) 12/18/2020   Depression with anxiety 12/18/2020   Essential hypertension 12/18/2020   Isolated cervical dystonia 12/06/2016    Willow Ora, PTA, [facility] [address] [place], [place] [address] [phone] 05/20/21, 11:18 PM   Name: Gianno Volner MRN: [id number] Date of Birth: 05-21-1956

## 2021-05-21 DIAGNOSIS — I951 Orthostatic hypotension: Secondary | ICD-10-CM | POA: Diagnosis not present

## 2021-05-21 DIAGNOSIS — I10 Essential (primary) hypertension: Secondary | ICD-10-CM | POA: Diagnosis not present

## 2021-05-25 ENCOUNTER — Ambulatory Visit
Admission: RE | Admit: 2021-05-25 | Discharge: 2021-05-25 | Disposition: A | Discharge status: Home / Self Care | Payer: BC Managed Care – PPO | Source: Ambulatory Visit | Attending: Adult Health | Admitting: Adult Health

## 2021-05-25 ENCOUNTER — Ambulatory Visit: Payer: BC Managed Care – PPO | Admitting: Physical Therapy

## 2021-05-25 ENCOUNTER — Other Ambulatory Visit: Payer: Self-pay

## 2021-05-25 ENCOUNTER — Other Ambulatory Visit: Payer: Self-pay | Admitting: Adult Health

## 2021-05-25 VITALS — BP 118/87 | HR 78

## 2021-05-25 DIAGNOSIS — I639 Cerebral infarction, unspecified: Secondary | ICD-10-CM

## 2021-05-25 DIAGNOSIS — R29818 Other symptoms and signs involving the nervous system: Secondary | ICD-10-CM

## 2021-05-25 DIAGNOSIS — R2689 Other abnormalities of gait and mobility: Secondary | ICD-10-CM

## 2021-05-25 DIAGNOSIS — I6523 Occlusion and stenosis of bilateral carotid arteries: Secondary | ICD-10-CM

## 2021-05-25 DIAGNOSIS — I6381 Other cerebral infarction due to occlusion or stenosis of small artery: Secondary | ICD-10-CM

## 2021-05-25 DIAGNOSIS — R260 Ataxic gait: Secondary | ICD-10-CM

## 2021-05-25 DIAGNOSIS — R42 Dizziness and giddiness: Secondary | ICD-10-CM | POA: Diagnosis not present

## 2021-05-25 DIAGNOSIS — I6502 Occlusion and stenosis of left vertebral artery: Secondary | ICD-10-CM

## 2021-05-25 DIAGNOSIS — R2681 Unsteadiness on feet: Secondary | ICD-10-CM

## 2021-05-25 DIAGNOSIS — I7774 Dissection of vertebral artery: Secondary | ICD-10-CM

## 2021-05-25 MED ORDER — IOPAMIDOL (ISOVUE-370) INJECTION 76%
75.0000 mL | Freq: Once | INTRAVENOUS | Status: AC | PRN
  Administered 2021-05-25: 75 mL via INTRAVENOUS

## 2021-05-25 NOTE — Therapy (Addendum)
Miguel [name] [address] [place], [place], [address] Phone: [phone]   Fax:  [phone]  Physical Therapy Treatment/Re-Cert  Patient Details  Name: Miguel [name] MRN: [id number] Date of Birth: Jul 27, 1956 Referring Provider (PT): Dr. Marcelino Scot   Encounter Date: 05/25/2021    05/25/21 0809  PT Visits / Re-Eval  Visit Number 23  Number of Visits 30  Date for PT Re-Evaluation 68/34/19 (per recert on 02/04/21)  Authorization  Authorization Type BCBS (03/05/21-03/04/22)  Authorization - Visit Number 20  Authorization - Number of Visits 30  PT Time Calculation  PT Start Time 0805  PT Stop Time 0845  PT Time Calculation (min) 40 min  PT - End of Session  Equipment Utilized During Treatment Gait belt  Activity Tolerance Patient tolerated treatment well;No increased pain  Behavior During Therapy WFL for tasks assessed/performed     Past Medical History:  Diagnosis Date   Cervical dystonia    Depression    Erectile dysfunction due to arterial insufficiency    Functional dyspepsia    Hypertension    Pancreatitis 2005   likely secondary to alcohol use   Rosacea     Past Surgical History:  Procedure Laterality Date   no past surgery     TIBIA IM NAIL INSERTION Left 02/07/2021   Procedure: INTRAMEDULLARY (IM) NAIL TIBIAL;  Surgeon: Altamese [place], MD;  Location: [place];  Service: Orthopedics;  Laterality: Left;    Vitals:   05/25/21 0807 05/25/21 0824  BP: 112/74 118/87  Pulse: 77 78     Subjective Assessment - 05/25/21 0807     Subjective Having CTA of head and neck today. Saw primary MD on Friday who restarted BP medicine. No falls. Continues with Sciatic pain, does have appt with Neuro [name], NP) next week and will mention this. None a this time as he stretched it "real good" before coming to therapy today.    Pertinent History [name]. history of cervical dystonia and receives Xeomin injections in  the past followed by neurology service Dr. Evelena Leyden at [facility], hypertension, depression, tobacco and alcohol use    How long can you walk comfortably? farthest walk in hospital was 87' with RW.    Diagnostic tests MRI showed acute infarct left inferior posterior cerebellum.  Small acute infarct right thalamus.    Patient Stated Goals build up his legs and arms, walking again    Currently in Pain? No/denies    Pain Score 0-No pain    Pain Location Back    Pain Orientation Right               05/27/21 0001  Assessment  Medical Diagnosis CVA/ s/p L tib/fib fx  Referring Provider (PT) Dr. Marcelino Scot  Onset Date/Surgical Date 02/05/21  Prior Function  Level of Independence Independent      [facility] PT Assessment - 05/25/21 0814       Timed Up and Go Test   TUG Normal TUG    Normal TUG (seconds) 14.06    TUG Comments with SPC and min guard                    [facility] Adult PT Treatment/Exercise - 05/25/21 0814       Transfers   Transfers Sit to Stand;Stand to Sit    Sit to Stand 5: Supervision;With upper extremity assist;4: Min guard    Stand to Sit 5: Supervision;With upper extremity assist;To bed;To chair/3-in-1  Ambulation/Gait   Ambulation/Gait Yes    Ambulation/Gait Assistance 5: Supervision;4: Min guard    Ambulation/Gait Assistance Details min guard assist with outdoor surfaces, supervision to min guard assist on indoor surfaces with cues on posture. pt with occasional increased lateral weight shfiting needing assist to correct.    Ambulation Distance (Feet) 210 Feet   x1 around gym, 220 x1 in/outdoors, plus around clinic with session   Assistive device Straight cane    Gait Pattern Decreased weight shift to left;Step-through pattern;Ataxic    Ambulation Surface Level;Indoor    Gait velocity 14.28 sec's= 2.30 ft/sec with cane                 Balance Exercises - 05/25/21 0829       Balance Exercises: Standing   Standing Eyes Closed  Narrow base of support (BOS);Wide (BOA);Other reps (comment);30 secs;Limitations    Standing Eyes Closed Limitations on airex with feet hip width apart for EC 30 sec's x 3 reps, then feet wider apart for EC for head movements left<>right, up<>down for ~10 reps each. no UE support with min guard to min assist for balance. Cues on posture/weight shifting to assist with balance.    Other Standing Exercises on airex with feet apart and light UE support on bars: heel<>toe raises, then alternating slow marching for 10 reps each, cues on posture and technique. Visable muscle tremor noted with stance leg.                  PT Short Term Goals - 05/06/21 5400       PT SHORT TERM GOAL #1   Title Pt will perform TUG with SPC in 16 seconds or less with supervision/min guard in order to demo decr fall risk. ALL STGS DUE 05/04/21    Baseline 16.28 seconds with min guard/min A; 16.97 seconds with SPC and min guard on 05/03/21    Time 3    Period Weeks    Status Partially Met    Target Date 05/04/21      PT SHORT TERM GOAL #2   Title Pt will undergo further gait speed assessment with cane with LTG written.    Baseline 2.71 ft/sec with RW; 2.25 ft/sec with SPC with LTG written    Time 3    Period Weeks    Status Achieved      PT SHORT TERM GOAL #3   Title Pt will ambulate at least 230' over indoor level surfaces with cane with supervision/min guard in order to demo improved household mobility.    Baseline met with min guard on 05/03/21    Time 3    Period Weeks    Status Achieved      PT SHORT TERM GOAL #4   Title Pt will improve BERG score to at least a 46/56 in order to demo decr fall risk.    Baseline 42/56; 48/56    Time 3    Period Weeks    Status Achieved            Revised STGs for re-cert:  PT Short Term Goals - 05/27/21 1148       PT SHORT TERM GOAL #1   Title Pt will undergo further assessment of DGI with LTG written in order to demo decr fall risk. ALL STGS DUE 06/26/21     Baseline not yet assessed.    Time 4    Period Weeks    Status Partially Met    Target Date  06/24/21      PT SHORT TERM GOAL #2   Title Pt will ascend/descend 12 steps with single handrail with supervision with step to vs. step through pattern in order to demo improved safety with stairs at home.    Time 4    Period Weeks    Status New      PT SHORT TERM GOAL #3   Title Pt will ambulate at least 230' over indoor level surfaces with cane with supervision in order to demo improved household mobility.    Baseline min guard on 05/03/21    Time 4    Period Weeks    Status New                PT Long Term Goals - 05/25/21 1884       PT LONG TERM GOAL #1   Title Pt will improve gait speed with SPC to at least 2.6 ft/sec for improved gait efficiency. ALL LTGS DUE 05/25/21    Baseline 05/25/21: 2.30 ft/sec with cane    Time --    Period --    Status Achieved      PT LONG TERM GOAL #2   Title Pt will ambulate at least 200' with LRAD over outdoor unlevel surfaces with supervision in order to demo improved community mobility.    Baseline 9/20/2: supervision with RW on unlevel surfaces, min guard assist with cane    Time --    Period --    Status Achieved      PT LONG TERM GOAL #3   Title Pt will perform TUG with SPC with supervision in 15 seconds or lesss in order to demo decr fall risk.    Baseline 05/25/21: 14.06 sec's with cane    Time --    Period --    Status Achieved      PT LONG TERM GOAL #4   Title Pt will improve FOTO score to 57 in order to demo improved functional outcomes.    Baseline 05/25/21: pt's PT FOTO has been discharge already, unable to reassess    Time --    Period --    Status Deferred      PT LONG TERM GOAL #5   Title Pt will improve BERG score to at least a 50/56 in order to demo decr fall risk.    Baseline 05/10/21: 49/56 scored today, improved from 42/56 at last assessment, just not to goal    Status Partially Met              Revised/ongoing LTGs for re-cert:     PT Long Term Goals - 05/27/21 1156       PT LONG TERM GOAL #1   Title Pt will improve gait speed with SPC to at least 2.5 ft/sec for improved gait efficiency. ALL LTGS DUE 07/22/21    Baseline 05/25/21: 2.30 ft/sec with cane    Time 8    Period Weeks    Status New    Target Date 07/22/21      PT LONG TERM GOAL #2   Title Pt will ambulate at least 200' with SPC over outdoor unlevel surfaces with supervision in order to demo improved community mobility.    Baseline , min guard assist with cane    Time 8    Period Weeks    Status Revised      PT LONG TERM GOAL #3   Title Pt will perform TUG with SPC with supervision in 14  seconds or less with SPC and supervision in order to demo decr fall risk.    Baseline 05/25/21: 14.06 sec's with cane with min guard    Time 8    Period Weeks    Status Revised      PT LONG TERM GOAL #4   Title DGI goal to be written as appropriate in order to demo decr fall risk.    Time 8    Period Weeks    Status New                05/25/21 0810  Plan  Clinical Impression Statement Today's skilled session focused on progress toward remaining LTGs. Pt has met all goals except for 2- unable to reassess FOTO as it was discharged out already and progress was made on Berg Balance Test, just not to goal level. Remainder of session continued to focus on balance training with no issues noted or reported in session. From primary PT: pt with miminal improvement of gait speed with cane to 2.30 ft/sec (previously 2.25 ft/sec), and did not meet LTG #1. Pt with improvement of TUG time to 14.06 seconds with cane and met LTG #3, but pt still needs  min guard for safety with gait/turns. Pt with random, intermittent dizziness episodes, more so when pt has to look down <> up. Per vestibular assessment, pt's dizziness appears to be related to cervical position, history of torticollis, and of central origin. Will re-cert for  additional visits to continue to work on gait, balance, ROM, strength, functional transfers, dizziness, in order to decr pt's fall risk and improve functional mobility. Pt is limited by a hard visit limit of 30 (has already used 20 visits so far). Pt wishes to use remainder of visits until he gets Medicare in December. STGs/LTGs updated as appropriate.  Personal Factors and Comorbidities Comorbidity 3+;Past/Current Experience  Comorbidities Cervical dystonia, HTN, depression, anxiety.  Examination-Activity Limitations Stairs;Stand;Squat;Locomotion Level;Transfers;Bend  Examination-Participation Restrictions Community Activity;Driving;Yard Work;Occupation  Pt will benefit from skilled therapeutic intervention in order to improve on the following deficits Abnormal gait;Decreased coordination;Decreased balance;Decreased safety awareness;Decreased strength;Difficulty walking;Dizziness;Decreased activity tolerance  Stability/Clinical Decision Making Evolving/Moderate complexity  Rehab Potential Good  PT Frequency 2x / week (1-2x a week - 9 remaining visits over 8 weeks)  PT Duration 8 weeks (1-2x a week - 9 remaining visits over 8 weeks)  PT Treatment/Interventions ADLs/Self Care Home Management;Stair training;Gait training;DME Instruction;Functional mobility training;Therapeutic activities;Therapeutic exercise;Neuromuscular re-education;Balance training;Patient/family education;Vestibular;Manual techniques;Passive range of motion  PT Next Visit Plan if pt agrees- continue using laser for proprioception in standing with eyes closed and up/down motions (this still tends to give pt the most dizziness), midline orientation for stepping/walking.   gait/balance with head movements. continue to work on gait with cane. balance on compliant surfaces and incr vestibular input. add any additional neck stretches  PT Home Exercise Plan Access Code [email or web address]  Consulted and Agree with Plan of Care Patient  Family Member  Consulted pt's wife     Patient will benefit from skilled therapeutic intervention in order to improve the following deficits and impairments:  Abnormal gait, Decreased coordination, Decreased balance, Decreased safety awareness, Decreased strength, Difficulty walking, Dizziness, Decreased activity tolerance  Visit Diagnosis: Other abnormalities of gait and mobility  Unsteadiness on feet  Dizziness and giddiness  Other symptoms and signs involving the nervous system  Ataxic gait     Problem List Patient Active Problem List   Diagnosis Date Noted   Fall 02/05/2021   Fracture tibia/fibula, left,  closed, initial encounter 02/05/2021   Cerebrovascular accident (CVA) of right thalamus ([place]) 12/31/2020   Cerebral infarction due to embolism of left cerebellar artery ([place]) 12/31/2020   Alcohol use 12/19/2020   Acute CVA (cerebrovascular accident) ([place]) 12/18/2020   Depression with anxiety 12/18/2020   Essential hypertension 12/18/2020   Isolated cervical dystonia 12/06/2016    Willow Ora, PTA, [facility] Outpatient Neuro [facility] [address] [place], [place] [address] [phone] 05/25/21, 3:57 PM   Name: Shann Merrick MRN: [id number] Date of Birth: [birth year]   Janann August, PT, DPT 05/27/21 11:37 AM

## 2021-05-27 NOTE — Addendum Note (Signed)
Addended by: Arliss Journey on: 05/27/2021 11:59 AM   Modules accepted: Orders

## 2021-05-28 ENCOUNTER — Encounter: Payer: Self-pay | Admitting: Physical Therapy

## 2021-05-28 ENCOUNTER — Ambulatory Visit: Payer: BC Managed Care – PPO | Admitting: Physical Therapy

## 2021-05-28 ENCOUNTER — Other Ambulatory Visit: Payer: Self-pay

## 2021-05-28 VITALS — BP 128/80 | HR 76

## 2021-05-28 DIAGNOSIS — R2681 Unsteadiness on feet: Secondary | ICD-10-CM

## 2021-05-28 DIAGNOSIS — R260 Ataxic gait: Secondary | ICD-10-CM | POA: Diagnosis not present

## 2021-05-28 DIAGNOSIS — R2689 Other abnormalities of gait and mobility: Secondary | ICD-10-CM

## 2021-05-28 DIAGNOSIS — R42 Dizziness and giddiness: Secondary | ICD-10-CM | POA: Diagnosis not present

## 2021-05-28 DIAGNOSIS — R29818 Other symptoms and signs involving the nervous system: Secondary | ICD-10-CM

## 2021-05-28 NOTE — Therapy (Signed)
[facility] [address] [place], [place], [address] Phone: [phone]   Fax:  [phone]  Physical Therapy Treatment  Patient Details  Name: Miguel [name] MRN: [id number] Date of Birth: [birth year] Referring Provider (PT): Dr. Marcelino [name] (now followed by Miguel [name])   Encounter Date: 05/28/2021   PT End of Session - 05/28/21 0807     Visit Number 24    Number of Visits 32    Date for PT Re-Evaluation 88/50/27   per recert on 7/41/28   Authorization Type BCBS (03/05/21-03/04/22)    Authorization - Visit Number 21    Authorization - Number of Visits 30    PT Start Time [id number]    PT Stop Time 0800    PT Time Calculation (min) 42 min    Equipment Utilized During Treatment Gait belt    Activity Tolerance Patient tolerated treatment well;No increased pain    Behavior During Therapy WFL for tasks assessed/performed             Past Medical History:  Diagnosis Date   Cervical dystonia    Depression    Erectile dysfunction due to arterial insufficiency    Functional dyspepsia    Hypertension    Pancreatitis 2005   likely secondary to alcohol use   Rosacea     Past Surgical History:  Procedure Laterality Date   no past surgery     TIBIA IM NAIL INSERTION Left 02/07/2021   Procedure: INTRAMEDULLARY (IM) NAIL TIBIAL;  Surgeon: Altamese [place], MD;  Location: [place];  Service: Orthopedics;  Laterality: Left;    Vitals:   05/28/21 0730  BP: 128/80  Pulse: 76     Subjective Assessment - 05/28/21 0723     Subjective CTA looks good. Still having sciatic pain, has been doing the stretches. Sees Miguel [name] next week.    Pertinent History [name] [name]. history of cervical dystonia and receives Xeomin injections in the past followed by neurology service Miguel [name] at [facility], hypertension, depression, tobacco and alcohol use    How long can you walk comfortably? farthest walk in hospital was 31' with  RW.    Diagnostic tests MRI showed acute infarct left inferior posterior cerebellum.  Small acute infarct right thalamus.    Patient Stated Goals build up his legs and arms, walking again    Currently in Pain? Yes    Pain Score 2     Pain Location Knee   lateral   Pain Orientation Right    Pain Descriptors / Indicators Aching;Sore    Pain Type Acute pain;Chronic pain    Aggravating Factors  going up stairs    Pain Relieving Factors rest, tylenol, stretching                [facility] PT Assessment - 05/28/21 0813       Assessment   Referring Provider (PT) Dr. Marcelino [name] (now followed by Miguel [name])                           [facility] Adult PT Treatment/Exercise - 05/28/21 0001       Ambulation/Gait   Ambulation/Gait Yes    Ambulation/Gait Assistance 5: Supervision    Ambulation/Gait Assistance Details small distances in clinic between activities    Assistive device Straight cane    Gait Pattern Decreased weight shift to left;Step-through pattern;Ataxic    Ambulation Surface Level;Indoor      Exercises  Exercises Other Exercises    Other Exercises  pt doing his supine stretches at home, showed pt how to do seated hamstring stretch for home, cues for technique and 30 second hold, can also add in ankle PF/DF for nerve glide                 Balance Exercises - 05/28/21 0001       Balance Exercises: Standing   Standing Eyes Opened Wide (BOA);Foam/compliant surface    Standing Eyes Opened Limitations on blue foam beam with wide BOS 2 x 5 reps head turns, 2 x 5 reps head nods, incr difficulty with head turns    Standing Eyes Closed Foam/compliant surface;3 reps;30 secs    Standing Eyes Closed Limitations feet hip width distance, min guard for balance    SLS Eyes open;Upper extremity support 2;Limitations    SLS Limitations using soccer ball - having LLE as stance leg and gently putting RLE on ball and rolling forwards/backwards x3 reps, performed x3 each  side, needing UE support    SLS with Vectors Intermittent upper extremity assist;Solid surface;Limitations    SLS with Vectors Limitations step taps at bottom of staircase - with LLE as stance leg tapping RLE to first step x6 reps then first/2nd step x6 reps, repeated with RLE as stance leg, min guard, incr difficulty with RLE as stance leg    Stepping Strategy Anterior;Foam/compliant surface;10 reps;Limitations    Stepping Strategy Limitations on blue foam beam x10 reps bilat with cues for foot clearance and intermittent taps for balance    Sidestepping Foam/compliant support    Sidestepping Limitations on blue foam beam, fingertip support > none, down and back x3 reps                  PT Short Term Goals - 05/27/21 1148       PT SHORT TERM GOAL #1   Title Pt will undergo further assessment of DGI with LTG written in order to demo decr fall risk. ALL STGS DUE 06/26/21    Baseline not yet assessed.    Time 4    Period Weeks    Status Partially Met    Target Date 06/24/21      PT SHORT TERM GOAL #2   Title Pt will ascend/descend 12 steps with single handrail with supervision with step to vs. step through pattern in order to demo improved safety with stairs at home.    Time 4    Period Weeks    Status New      PT SHORT TERM GOAL #3   Title Pt will ambulate at least 230' over indoor level surfaces with cane with supervision in order to demo improved household mobility.    Baseline min guard on 05/03/21    Time 4    Period Weeks    Status New               PT Long Term Goals - 05/27/21 1156       PT LONG TERM GOAL #1   Title Pt will improve gait speed with SPC to at least 2.5 ft/sec for improved gait efficiency. ALL LTGS DUE 07/22/21    Baseline 05/25/21: 2.30 ft/sec with cane    Time 8    Period Weeks    Status New    Target Date 07/22/21      PT LONG TERM GOAL #2   Title Pt will ambulate at least 200' with SPC over outdoor unlevel surfaces  with supervision in  order to demo improved community mobility.    Baseline , min guard assist with cane    Time 8    Period Weeks    Status Revised      PT LONG TERM GOAL #3   Title Pt will perform TUG with SPC with supervision in 14 seconds or less with SPC and supervision in order to demo decr fall risk.    Baseline 05/25/21: 14.06 sec's with cane with min guard    Time 8    Period Weeks    Status Revised      PT LONG TERM GOAL #4   Title DGI goal to be written as appropriate in order to demo decr fall risk.    Time 8    Period Weeks    Status New                   Plan - 05/28/21 9225     Clinical Impression Statement Today's skilled session focused on standing balance strategies with SLS tasks and on compliant surfaces and adding in a seated hamstring stretch/nerve glide for home for sciatic pain. Pt still with sciatic pain, but has improved since working on his stretches at home. No reports of pain or dizziness during today's session. Pt needing UE support during SLS tasks, will continue to progress towards LTGs.    Personal Factors and Comorbidities Comorbidity 3+;Past/Current Experience    Comorbidities Cervical dystonia, HTN, depression, anxiety.    Examination-Activity Limitations Stairs;Stand;Squat;Locomotion Level;Transfers;Bend    Examination-Participation Restrictions Community Activity;Driving;Yard Work;Occupation    Stability/Clinical Decision Making Evolving/Moderate complexity    Rehab Potential Good    PT Frequency 2x / week   1-2x a week - 9 remaining visits over 8 weeks   PT Duration 8 weeks   1-2x a week - 9 remaining visits over 8 weeks   PT Treatment/Interventions ADLs/Self Care Home Management;Stair training;Gait training;DME Instruction;Functional mobility training;Therapeutic activities;Therapeutic exercise;Neuromuscular re-education;Balance training;Patient/family education;Vestibular;Manual techniques;Passive range of motion    PT Next Visit Plan gait/balance with  head movements. continue to work on gait with cane. balance on compliant surfaces and incr vestibular input. SLS tasks/ how is sciatic pain?    PT Home Exercise Plan Access Code [email or web address]    Consulted and Agree with Plan of Care Patient    Family Member Consulted pt's wife             Patient will benefit from skilled therapeutic intervention in order to improve the following deficits and impairments:  Abnormal gait, Decreased coordination, Decreased balance, Decreased safety awareness, Decreased strength, Difficulty walking, Dizziness, Decreased activity tolerance  Visit Diagnosis: Other abnormalities of gait and mobility  Unsteadiness on feet  Other symptoms and signs involving the nervous system     Problem List Patient Active Problem List   Diagnosis Date Noted   Fall 02/05/2021   Fracture tibia/fibula, left, closed, initial encounter 02/05/2021   Cerebrovascular accident (CVA) of right thalamus ([facility]) 12/31/2020   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

## 2021-05-31 ENCOUNTER — Ambulatory Visit: Payer: BC Managed Care – PPO | Admitting: Physical Therapy

## 2021-05-31 ENCOUNTER — Encounter: Payer: Self-pay | Admitting: Physical Therapy

## 2021-05-31 ENCOUNTER — Other Ambulatory Visit: Payer: Self-pay

## 2021-05-31 VITALS — BP 110/74 | HR 82

## 2021-05-31 DIAGNOSIS — R2681 Unsteadiness on feet: Secondary | ICD-10-CM | POA: Diagnosis not present

## 2021-05-31 DIAGNOSIS — R2689 Other abnormalities of gait and mobility: Secondary | ICD-10-CM | POA: Diagnosis not present

## 2021-05-31 DIAGNOSIS — R260 Ataxic gait: Secondary | ICD-10-CM | POA: Diagnosis not present

## 2021-05-31 DIAGNOSIS — R29818 Other symptoms and signs involving the nervous system: Secondary | ICD-10-CM

## 2021-05-31 DIAGNOSIS — R42 Dizziness and giddiness: Secondary | ICD-10-CM | POA: Diagnosis not present

## 2021-05-31 NOTE — Therapy (Addendum)
[facility] [address] [place], [place], [address] Phone: [phone]   Fax:  [phone]  Physical Therapy Treatment  Patient Details  Name: Miguel [name] MRN: [id number] Date of Birth: [birth year] Referring Provider (PT): Dr. Marcelino Scot (now followed by Mena Goes)   Encounter Date: 05/31/2021   PT End of Session - 05/31/21 1017     Visit Number 25    Number of Visits 32    Date for PT Re-Evaluation 94/49/67   per recert on 5/91/63   Authorization Type BCBS (03/05/21-03/04/22)    Authorization - Visit Number 22    Authorization - Number of Visits 30    PT Start Time 8466    PT Stop Time 1015    PT Time Calculation (min) 41 min    Equipment Utilized During Treatment Gait belt    Activity Tolerance Patient tolerated treatment well;No increased pain    Behavior During Therapy WFL for tasks assessed/performed             Past Medical History:  Diagnosis Date   Cervical dystonia    Depression    Erectile dysfunction due to arterial insufficiency    Functional dyspepsia    Hypertension    Pancreatitis 2005   likely secondary to alcohol use   Rosacea     Past Surgical History:  Procedure Laterality Date   no past surgery     TIBIA IM NAIL INSERTION Left 02/07/2021   Procedure: INTRAMEDULLARY (IM) NAIL TIBIAL;  Surgeon: Altamese Bellechester, MD;  Location: [facility];  Service: Orthopedics;  Laterality: Left;    Vitals:   05/31/21 0938  BP: 110/74  Pulse: 82     Subjective Assessment - 05/31/21 0936     Subjective No changes. Had one good day over the weekend and one bad day. No dizzy episodes. BP has been good. No falls.    Pertinent History [name]. history of cervical dystonia and receives Xeomin injections in the past followed by neurology service Dr. Evelena Leyden at [facility], hypertension, depression, tobacco and alcohol use    How long can you walk comfortably? farthest walk in hospital was 67'  with RW.    Diagnostic tests MRI showed acute infarct left inferior posterior cerebellum.  Small acute infarct right thalamus.    Patient Stated Goals build up his legs and arms, walking again    Currently in Pain? Yes    Pain Score 2     Pain Location Knee   lateral   Pain Orientation Right    Pain Descriptors / Indicators Aching;Sore    Pain Type Chronic pain    Aggravating Factors  going up stairs    Pain Relieving Factors rest, tylenol, stretching                               [facility] Adult PT Treatment/Exercise - 05/31/21 0944       Ambulation/Gait   Ambulation/Gait Yes    Ambulation/Gait Assistance 4: Min guard    Ambulation/Gait Assistance Details With scanning environment with pt using SPC. Pt with tendency to stop and turn his head vs. continuing to walk and perform head motions. No dizziness    Ambulation Distance (Feet) 230 Feet    Assistive device Straight cane    Gait Pattern Decreased weight shift to left;Step-through pattern;Ataxic    Ambulation Surface Level;Indoor  Balance Exercises - 05/31/21 0956       Balance Exercises: Standing   Standing, One Foot on a Step 6 inch;Limitations    Standing, One Foot on a Step Limitations at bottom of staircase - placing LLE on step 2 x 5 reps head turns, x5 reps head nods. resetting before each set. performed with RLE as stance leg. performed with no UE support with close min guard (esp for head nods)    Stepping Strategy Lateral;Anterior;Foam/compliant surface;Limitations;10 reps    Stepping Strategy Limitations on level ground; x12 reps bilat lateral stepping over 4" obstacle without UE support, then performed on blue air ex alternating x10 reps bilat, with LLE as stance leg stepping RLE off forwards on air ex and then back on x5 reps    Rockerboard EO;Lateral;Limitations    Rockerboard Limitations weight shifting x12 reps bilat with 5 second holds, holding board steady 2 x 30 seconds     Step Ups Forward;Lateral;UE support 2;Limitations;UE support 1    Step Ups Limitations with LLE x10 reps each direction and floating RLE, pt needing UE support                  PT Short Term Goals - 05/27/21 1148       PT SHORT TERM GOAL #1   Title Pt will undergo further assessment of DGI with LTG written in order to demo decr fall risk. ALL STGS DUE 06/26/21    Baseline not yet assessed.    Time 4    Period Weeks    Status Partially Met    Target Date 06/24/21      PT SHORT TERM GOAL #2   Title Pt will ascend/descend 12 steps with single handrail with supervision with step to vs. step through pattern in order to demo improved safety with stairs at home.    Time 4    Period Weeks    Status New      PT SHORT TERM GOAL #3   Title Pt will ambulate at least 230' over indoor level surfaces with cane with supervision in order to demo improved household mobility.    Baseline min guard on 05/03/21    Time 4    Period Weeks    Status New               PT Long Term Goals - 05/27/21 1156       PT LONG TERM GOAL #1   Title Pt will improve gait speed with SPC to at least 2.5 ft/sec for improved gait efficiency. ALL LTGS DUE 07/22/21    Baseline 05/25/21: 2.30 ft/sec with cane    Time 8    Period Weeks    Status New    Target Date 07/22/21      PT LONG TERM GOAL #2   Title Pt will ambulate at least 200' with SPC over outdoor unlevel surfaces with supervision in order to demo improved community mobility.    Baseline , min guard assist with cane    Time 8    Period Weeks    Status Revised      PT LONG TERM GOAL #3   Title Pt will perform TUG with SPC with supervision in 14 seconds or less with SPC and supervision in order to demo decr fall risk.    Baseline 05/25/21: 14.06 sec's with cane with min guard    Time 8    Period Weeks    Status Revised  PT LONG TERM GOAL #4   Title DGI goal to be written as appropriate in order to demo decr fall risk.    Time 8     Period Weeks    Status New                   Plan - 05/31/21 1211     Clinical Impression Statement Pt with no reports of dizziness throughout session today. Performed gait with head motions with SPC with pt needing to slow down or stop in order to perform head turn. Remainder of session focused on SLS tasks and weight shifting working on decr UE support. Will continue to progress towards LTGs.    Personal Factors and Comorbidities Comorbidity 3+;Past/Current Experience    Comorbidities Cervical dystonia, HTN, depression, anxiety.    Examination-Activity Limitations Stairs;Stand;Squat;Locomotion Level;Transfers;Bend    Examination-Participation Restrictions Community Activity;Driving;Yard Work;Occupation    Stability/Clinical Decision Making Evolving/Moderate complexity    Rehab Potential Good    PT Frequency 2x / week   1-2x a week - 9 remaining visits over 8 weeks   PT Duration 8 weeks   1-2x a week - 9 remaining visits over 8 weeks   PT Treatment/Interventions ADLs/Self Care Home Management;Stair training;Gait training;DME Instruction;Functional mobility training;Therapeutic activities;Therapeutic exercise;Neuromuscular re-education;Balance training;Patient/family education;Vestibular;Manual techniques;Passive range of motion    PT Next Visit Plan gait/balance with head movements. continue to work on gait with cane. balance on compliant surfaces and incr vestibular input. SLS tasks/ how is sciatic pain? need to perform DGI for LTG.    PT Home Exercise Plan Access Code [email or web address]    Consulted and Agree with Plan of Care Patient    Family Member Consulted pt's wife             Patient will benefit from skilled therapeutic intervention in order to improve the following deficits and impairments:  Abnormal gait, Decreased coordination, Decreased balance, Decreased safety awareness, Decreased strength, Difficulty walking, Dizziness, Decreased activity tolerance  Visit  Diagnosis: Other abnormalities of gait and mobility  Unsteadiness on feet  Other symptoms and signs involving the nervous system     Problem List Patient Active Problem List   Diagnosis Date Noted   Fall 02/05/2021   Fracture tibia/fibula, left, closed, initial encounter 02/05/2021   Cerebrovascular accident (CVA) of right thalamus ([facility]) 12/31/2020   Cerebral infarction due to embolism of left cerebellar artery ([place]) 12/31/2020   Alcohol use 12/19/2020   Acute CVA (cerebrovascular accident) ([facility]) 12/18/2020   Depression with anxiety 12/18/2020   Essential hypertension 12/18/2020   Isolated cervical dystonia 12/06/2016    Arliss Journey, PT, DPT 05/31/2021, 12:13 PM  [facility] [address] [place], [place], [address] Phone: [phone]   Fax:  [phone]  Name: Miguel [name] MRN: [id number] Date of Birth: Jul 13, 1956

## 2021-06-02 ENCOUNTER — Other Ambulatory Visit (HOSPITAL_COMMUNITY): Payer: Self-pay

## 2021-06-03 ENCOUNTER — Other Ambulatory Visit: Payer: Self-pay

## 2021-06-03 ENCOUNTER — Encounter: Payer: Self-pay | Admitting: Adult Health

## 2021-06-03 ENCOUNTER — Encounter: Payer: Self-pay | Admitting: Physical Therapy

## 2021-06-03 ENCOUNTER — Ambulatory Visit: Payer: BC Managed Care – PPO | Admitting: Physical Therapy

## 2021-06-03 ENCOUNTER — Ambulatory Visit: Payer: BC Managed Care – PPO | Admitting: Adult Health

## 2021-06-03 ENCOUNTER — Other Ambulatory Visit (HOSPITAL_COMMUNITY): Payer: Self-pay

## 2021-06-03 VITALS — BP 110/65 | HR 79 | Ht 72.0 in | Wt 206.0 lb

## 2021-06-03 DIAGNOSIS — R7303 Prediabetes: Secondary | ICD-10-CM

## 2021-06-03 DIAGNOSIS — I10 Essential (primary) hypertension: Secondary | ICD-10-CM

## 2021-06-03 DIAGNOSIS — I639 Cerebral infarction, unspecified: Secondary | ICD-10-CM

## 2021-06-03 DIAGNOSIS — I6502 Occlusion and stenosis of left vertebral artery: Secondary | ICD-10-CM | POA: Diagnosis not present

## 2021-06-03 DIAGNOSIS — R260 Ataxic gait: Secondary | ICD-10-CM | POA: Diagnosis not present

## 2021-06-03 DIAGNOSIS — R29818 Other symptoms and signs involving the nervous system: Secondary | ICD-10-CM | POA: Diagnosis not present

## 2021-06-03 DIAGNOSIS — R2689 Other abnormalities of gait and mobility: Secondary | ICD-10-CM

## 2021-06-03 DIAGNOSIS — R2681 Unsteadiness on feet: Secondary | ICD-10-CM

## 2021-06-03 DIAGNOSIS — I6381 Other cerebral infarction due to occlusion or stenosis of small artery: Secondary | ICD-10-CM

## 2021-06-03 DIAGNOSIS — I6523 Occlusion and stenosis of bilateral carotid arteries: Secondary | ICD-10-CM | POA: Diagnosis not present

## 2021-06-03 DIAGNOSIS — R42 Dizziness and giddiness: Secondary | ICD-10-CM | POA: Diagnosis not present

## 2021-06-03 DIAGNOSIS — M5417 Radiculopathy, lumbosacral region: Secondary | ICD-10-CM

## 2021-06-03 DIAGNOSIS — E785 Hyperlipidemia, unspecified: Secondary | ICD-10-CM

## 2021-06-03 NOTE — Patient Instructions (Signed)
Continue clopidogrel 75 mg daily  and atorvastatin 80mg  daily  for secondary stroke prevention  Continue working with therapies - please let me know if your back continues to worsen or does not improve after the next couple of months as we may need to proceed with imaging at that time  Continue to follow up with PCP regarding cholesterol and blood pressure management  Maintain strict control of hypertension with blood pressure goal below 130/90 and cholesterol with LDL cholesterol (bad cholesterol) goal below 70 mg/dL.      Followup in the future with me in 6 months or call earlier if needed     Thank you for coming to see [place] at [facility]. I hope we have been able to provide you high quality care today.  You may receive a patient satisfaction survey over the next few weeks. We would appreciate your feedback and comments so that we may continue to improve ourselves and the health of our patients.

## 2021-06-03 NOTE — Therapy (Signed)
[facility] [address] [place], [place], [address] Phone: [phone]   Fax:  [phone]  Physical Therapy Treatment  Patient Details  Name: Miguel [name] MRN: [id number] Date of Birth: 07/03/1956 Referring Provider (PT): Dr. Marcelino Scot (now followed by Miguel [name])   Encounter Date: 06/03/2021   PT End of Session - 06/03/21 1238     Visit Number 26    Number of Visits 32    Date for PT Re-Evaluation 94/49/67   per recert on 5/91/63   Authorization Type BCBS (03/05/21-03/04/22)    Authorization - Visit Number 22    Authorization - Number of Visits 30    PT Start Time 8466    PT Stop Time 1315    PT Time Calculation (min) 42 min    Equipment Utilized During Treatment Gait belt    Activity Tolerance Patient tolerated treatment well;No increased pain    Behavior During Therapy WFL for tasks assessed/performed             Past Medical History:  Diagnosis Date   Cervical dystonia    Depression    Erectile dysfunction due to arterial insufficiency    Functional dyspepsia    Hypertension    Pancreatitis 2005   likely secondary to alcohol use   Rosacea     Past Surgical History:  Procedure Laterality Date   no past surgery     TIBIA IM NAIL INSERTION Left 02/07/2021   Procedure: INTRAMEDULLARY (IM) NAIL TIBIAL;  Surgeon: Altamese , MD;  Location: [facility];  Service: Orthopedics;  Laterality: Left;    There were no vitals filed for this visit.   Subjective Assessment - 06/03/21 1235     Subjective Saw Miguel [name] at [facility]. No changes made. Wants him to continue with therapies. Has had one more dizzy episode since here last. BP still been running good.    Patient is accompained by: Family member   spouse   Pertinent History [name] BY Miguel [name]. history of cervical dystonia and receives Xeomin injections in the past followed by neurology service Dr. Evelena Leyden at [facility], hypertension, depression, tobacco  and alcohol use    How long can you walk comfortably? farthest walk in hospital was 2' with RW.    Diagnostic tests MRI showed acute infarct left inferior posterior cerebellum.  Small acute infarct right thalamus.    Patient Stated Goals build up his legs and arms, walking again    Currently in Pain? Yes    Pain Score 4     Pain Location Back    Pain Orientation Right    Pain Descriptors / Indicators Aching;Sore    Pain Type Chronic pain    Pain Radiating Towards Sciatica pain    Pain Onset More than a month ago    Pain Frequency Constant    Aggravating Factors  increased activity    Pain Relieving Factors rest, tylenol, stretching                 [facility] Adult PT Treatment/Exercise - 06/03/21 1239       Transfers   Transfers Sit to Stand;Stand to Sit    Sit to Stand 5: Supervision;With upper extremity assist;4: Min guard    Stand to Sit 5: Supervision;With upper extremity assist;To bed;To chair/3-in-1      Ambulation/Gait   Ambulation/Gait Yes    Ambulation/Gait Assistance 4: Min guard;5: Supervision    Ambulation/Gait Assistance Details cues on cane placement at times with gait  Ambulation Distance (Feet) --   around clinic with session   Assistive device Rolling walker;Straight cane    Gait Pattern Decreased weight shift to left;Step-through pattern;Ataxic    Ambulation Surface Level;Indoor      High Level Balance   High Level Balance Activities Side stepping;Backward walking    High Level Balance Comments with cane next to counter top: side stepping x 3 laps each way, then backward walking x 2 laps, min guard to min assist for balance.      Neuro Re-ed    Neuro Re-ed Details  for balance/muscle re-ed: gait along ~50 foot hallway with cane- forward gait with head movements left<>fwd<>right for 4 laps each. cues for head movements, not just eyes. min to mod assist with significant decrease in gait speed with veering noted. then had pt move head in up<>fwd<.down  direction for 2 laps, min guard to min assist for balance. decreased gait speed with minor veering noted. mild dizziness with head up movement and moderate with left and right movements that resolved quickly each time.                 Balance Exercises - 06/03/21 1252       Balance Exercises: Standing   Rockerboard Anterior/posterior;Lateral;EO;EC;30 seconds;10 reps;Limitations;Intermittent UE support    Rockerboard Limitations performed both ways on balance board with no UE support: rocking the board with emphasis on tall posture with EO, progressing to EC with no UE support, occasional touch to bars, min guard to min assist. then holding the board steady for EC 30 sec's x 3 reps, min guard to min assist with no UE support. cues on posture and weight shifting to assist with balance.    Balance Beam standing across red foam beam with out UE support on bars: alternating forward stepping to floor/back onto beam, then alternating backward stepping to floor/back onto beam for ~10 reps, light touch to bars at times for balance, min guard to min assist.                  PT Short Term Goals - 05/27/21 1148       PT SHORT TERM GOAL #1   Title Pt will undergo further assessment of DGI with LTG written in order to demo decr fall risk. ALL STGS DUE 06/26/21    Baseline not yet assessed.    Time 4    Period Weeks    Status Partially Met    Target Date 06/24/21      PT SHORT TERM GOAL #2   Title Pt will ascend/descend 12 steps with single handrail with supervision with step to vs. step through pattern in order to demo improved safety with stairs at home.    Time 4    Period Weeks    Status New      PT SHORT TERM GOAL #3   Title Pt will ambulate at least 230' over indoor level surfaces with cane with supervision in order to demo improved household mobility.    Baseline min guard on 05/03/21    Time 4    Period Weeks    Status New               PT Long Term Goals -  05/27/21 1156       PT LONG TERM GOAL #1   Title Pt will improve gait speed with SPC to at least 2.5 ft/sec for improved gait efficiency. ALL LTGS DUE 07/22/21    Baseline 05/25/21:  2.30 ft/sec with cane    Time 8    Period Weeks    Status New    Target Date 07/22/21      PT LONG TERM GOAL #2   Title Pt will ambulate at least 200' with SPC over outdoor unlevel surfaces with supervision in order to demo improved community mobility.    Baseline , min guard assist with cane    Time 8    Period Weeks    Status Revised      PT LONG TERM GOAL #3   Title Pt will perform TUG with SPC with supervision in 14 seconds or less with SPC and supervision in order to demo decr fall risk.    Baseline 05/25/21: 14.06 sec's with cane with min guard    Time 8    Period Weeks    Status Revised      PT LONG TERM GOAL #4   Title DGI goal to be written as appropriate in order to demo decr fall risk.    Time 8    Period Weeks    Status New                   Plan - 06/03/21 1238     Clinical Impression Statement Today's skilled session continued to focus on dynamic gait with a cane and balance training with mild dizziness reported at times that resolved quickly. No other issues noted or reported in session. The pt is making progress toward goals and should benefit from continued PT to progress toward unmet goals.    Personal Factors and Comorbidities Comorbidity 3+;Past/Current Experience    Comorbidities Cervical dystonia, HTN, depression, anxiety.    Examination-Activity Limitations Stairs;Stand;Squat;Locomotion Level;Transfers;Bend    Examination-Participation Restrictions Community Activity;Driving;Yard Work;Occupation    Stability/Clinical Decision Making Evolving/Moderate complexity    Rehab Potential Good    PT Frequency 2x / week   1-2x a week - 9 remaining visits over 8 weeks   PT Duration 8 weeks   1-2x a week - 9 remaining visits over 8 weeks   PT Treatment/Interventions ADLs/Self  Care Home Management;Stair training;Gait training;DME Instruction;Functional mobility training;Therapeutic activities;Therapeutic exercise;Neuromuscular re-education;Balance training;Patient/family education;Vestibular;Manual techniques;Passive range of motion    PT Next Visit Plan Perform DGI for LTG baseline. gait/balance with head movements. continue to work on gait with cane. balance on compliant surfaces and incr vestibular input. SLS tasks/ how is sciatic pain?    PT Home Exercise Plan Access Code [email or web address]    Consulted and Agree with Plan of Care Patient    Family Member Consulted pt's wife             Patient will benefit from skilled therapeutic intervention in order to improve the following deficits and impairments:  Abnormal gait, Decreased coordination, Decreased balance, Decreased safety awareness, Decreased strength, Difficulty walking, Dizziness, Decreased activity tolerance  Visit Diagnosis: Other abnormalities of gait and mobility  Unsteadiness on feet  Other symptoms and signs involving the nervous system     Problem List Patient Active Problem List   Diagnosis Date Noted   Fall 02/05/2021   Fracture tibia/fibula, left, closed, initial encounter 02/05/2021   Cerebrovascular accident (CVA) of right thalamus ([place]) 12/31/2020   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

## 2021-06-03 NOTE — Progress Notes (Signed)
[facility] [address] [place]. [place] [address] [phone]       STROKE FOLLOW UP NOTE  [name] Date of Birth:  [birth year] Medical Record Number:  [id number]   Reason for Referral: stroke follow up    SUBJECTIVE:   CHIEF COMPLAINT:  Chief Complaint  Patient presents with   Follow-up    RM 3 with spouse Miguel [name]  Pt is well, everything is slowly getting better. Still experiencing Dizziness when on feet and  vision complications but getting better.     HPI:   Today, 06/03/2021, Miguel [name] returns for 6-month stroke follow-up accompanied by his wife, Miguel [name].  Overall stable.  Working with PT for imbalance and dizziness slowly recovering. Has been driving very short distances. Does experience mild over stimulation in crowded areas. Denies new stroke/TIA symptoms. C/o right leg pain since he started ambulating on LLE towards the end of August - pain radiates down posterior right leg down into heel without weakness or sensory loss noted -worsens when laying on right side or with bending knee.Denies pain when laying flat or sitting. Denies any prior injury or prior back pain. Denies any loss of bowel or bladder function. Currently ambulating with rolling walker and denies any recent falls.  Denies new stroke/TIA symptoms. Compliant on Plavix and atorvastatin without side effects.  Blood pressure today 110/65.  Midodrine has since been discontinued and recently started on valsartan 80 mg daily per PCP due to elevated blood pressures (160s/90s). Denies any hypotensive episodes. Endorses EtOH use approx 6 beers/night. He was seen by Dr. Katy [name] and prescribed new glasses but does not use at all times due to difficulty tolerating bifocal lenses. Did receive botox by Dr. Erling [name] on 05/06/2021 for cervical dystonia. No further concerns at this time.      History provided for reference purposes only Initial visit 03/04/2021 Miguel [name]: Miguel [name] is being seen for hospital follow-up  accompanied by his wife, Miguel [name].  He was initially recovering well working with therapy with improvement of balance and graduated from rolling walker to cane but unfortunately on 6/3 he attempted to walk without assistance which led to a fall and resulted in left distal tibial fracture and some fractures of the fibula and underwent IMN of tibia on 6/5.  Reports he has been recovering well but still wearing full boot and is only modified weightbearing -he routinely follows with orthopedics and continues to work with therapies.  He also reports left forehead and maxillary sinus numbness/tingling - prior to his stroke, ran into a tree branch while riding a lawnmower which hit his left maxillary sinus area and once he stepped off from lawn mower, fell into an air compressor hitting left side forehead and temple.  He also reports left eye concerns which seems more so with having difficulty keeping it in his contact and left lower eyelid ptosis.  He denies any residual visual issues that he experienced with his stroke such as diplopia or blurred vision.  He also reports left hand tingling such as sensation that his hand is asleep but denies associated pain as well as mild left shoulder pain. 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  Denies new stroke/TIA symptoms.  Reports compliance on Plavix and atorvastatin without associated side effects.  Blood pressure today 116/80.  He has been started on midodrine by PCP due to symptomatic  hypotension -he has not experienced any additional hypotensive episodes since starting.  No further concerns at this time.  Stroke admission 12/18/2020 Miguel [name] is a 65 y.o. male with history of Cervical dystonia, Depression, Erectile dysfunction due to arterial insufficiency, Functional dyspepsia, Hypertension, Pancreatitis (2005), heavy EtOH use  and Rosacea. who presented on 12/18/2020 with dizziness, gait imbalance, leaning towards left and nausea and vomiting.  Personally reviewed hospitalization pertinent progress notes, lab and imaging summary provided.  Evaluated by Dr. Erlinda [name] with stroke work-up revealing acute left cerebellar and right thalamic ischemic infarct likely due to left VA occlusion possibly dissection due to lawnmower accident versus arthrosclerosis given risk factors.  CTA head and neck showed left VA origin stenosis and occluded at C1.  Bilateral ICA bulb atherosclerosis with right ICA 50% stenosis.  Carotid Doppler unremarkable but confirmed left VA origin stenosis.  2D echo EF 45 to 50%.  LDL 137 and initiate atorvastatin 80 mg daily.  A1c 6.4.  UDS positive for THC.  Recommended DAPT however allergy to aspirin therefore recommended Plavix and cilostazol for 3 weeks and Plavix alone.  PT/OT recommended CIR. D/c to CIR 4/28-5/12.      ROS:   14 system review of systems performed and negative with exception of those listed in HPI  PMH:  Past Medical History:  Diagnosis Date   Cervical dystonia    Depression    Erectile dysfunction due to arterial insufficiency    Functional dyspepsia    Hypertension    Pancreatitis 2005   likely secondary to alcohol use   Rosacea     PSH:  Past Surgical History:  Procedure Laterality Date   no past surgery     TIBIA IM NAIL INSERTION Left 02/07/2021   Procedure: INTRAMEDULLARY (IM) NAIL TIBIAL;  Surgeon: Miguel Springhill, MD;  Location: [facility];  Service: Orthopedics;  Laterality: Left;    Social History:  Social History   Socioeconomic History   Marital status: Married    Spouse name: Not on file   Number of children: 0   Years of education: some college   Highest education level: Not on file  Occupational History   Occupation: Associate Professor  Tobacco Use   Smoking status: Former   Smokeless tobacco: Never  Substance and Sexual Activity   Alcohol use: Yes     Comment: 3 beers per day   Drug use: No   Sexual activity: Not on file  Other Topics Concern   Not on file  Social History Narrative   Lives at home with his wife.   3 cups of caffeine day.   Right-handed.   Social Determinants of Health   Financial Resource Strain: Not on file  Food Insecurity: Not on file  Transportation Needs: Not on file  Physical Activity: Not on file  Stress: Not on file  Social Connections: Not on file  Intimate Partner Violence: Not on file    Family History:  Family History  Problem Relation Age of Onset   Uterine cancer Mother    Emphysema Father     Medications:   Current Outpatient Medications on File Prior to Visit  Medication Sig Dispense Refill   acetaminophen (TYLENOL) 500 MG tablet Take 500 mg by mouth every 6 (six) hours as needed for moderate pain or headache.     atorvastatin (LIPITOR) 80 MG tablet Take 1 tablet by mouth Once a day (Patient taking differently: Take 80 mg by mouth daily.) 90 tablet 3   buPROPion [facility]  SR) 150 MG 12 hr tablet TAKE 1 TABLET BY MOUTH ONCE DAILY 90 tablet 3   calcium carbonate (TUMS - DOSED IN MG ELEMENTAL CALCIUM) 500 MG chewable tablet Chew 1 tablet (200 mg of elemental calcium total) by mouth as needed for indigestion or heartburn.     Cholecalciferol (VITAMIN D) 50 MCG (2000 UT) CAPS Take 1 capsule (2,000 Units total) by mouth daily. 30 capsule 0   clonazePAM (KLONOPIN) 1 MG tablet TAKE 1 TABLET BY MOUTH ONCE A DAY (Patient taking differently: Take 1 mg by mouth daily.) 90 tablet 1   clopidogrel (PLAVIX) 75 MG tablet Take 1 tablet by mouth Once a day (Patient taking differently: Take 75 mg by mouth daily.) 90 tablet 3   HYDROcodone-acetaminophen (NORCO/VICODIN) 5-325 MG tablet Take 1-2 tablets by mouth every 6 (six) hours as needed for moderate pain or severe pain (pain score 4-6). 30 tablet 0   Melatonin 1 MG CHEW Chew 0.5 mg by mouth at bedtime as needed (sleep).     Multiple Vitamin (MULTIVITAMIN)  capsule Take 1 capsule by mouth daily.     valsartan (DIOVAN) 80 MG tablet Take 80 mg by mouth daily. Take half a tablet once a day     venlafaxine XR (EFFEXOR-XR) 75 MG 24 hr capsule TAKE 1 CAPSULE BY MOUTH ONCE A DAY (Patient taking differently: Take 75 mg by mouth daily.) 90 capsule 3   No current facility-administered medications on file prior to visit.    Allergies:   Allergies  Allergen Reactions   Aspirin Anaphylaxis    Childhood reaction   Losartan Potassium     Other reaction(s): shakes/nervousness   Sildenafil Citrate     Other reaction(s): severe headache      OBJECTIVE:  Physical Exam  Vitals:   06/03/21 1105  BP: 110/65  Pulse: 79  Weight: 206 lb (93.4 kg)  Height: 6' (1.829 m)   Body mass index is 27.94 kg/m. No results found.  General: well developed, well nourished, very pleasant middle-age Caucasian male, seated, in no evident distress Head: head normocephalic and atraumatic.   Neck: supple with no carotid or supraclavicular bruits Cardiovascular: regular rate and rhythm, no murmurs Musculoskeletal: no deformity Skin:  no rash/petichiae Vascular:  Normal pulses all extremities   Neurologic Exam Mental Status: Awake and fully alert.  Fluent speech and language.  Oriented to place and time. Recent and remote memory intact. Attention span, concentration and fund of knowledge appropriate. Mood and affect appropriate.  Cranial Nerves: Fundoscopic exam reveals sharp disc margins. Pupils equal, briskly reactive to light. Extraocular movements full without nystagmus.   Mild convergence insufficiency of left eye (reports history of left lazy eye).  Visual fields full to confrontation. Hearing intact. Facial sensation intact. face, tongue, palate moves normally and symmetrically.  Motor: Normal bulk and tone. Normal strength in all tested extremity muscles  Sensory.: intact to touch , pinprick , position and vibratory sensation.  Coordination: Rapid alternating  movements normal in all extremities. Finger-to-nose and heel-to-shine performed accurately bilaterally Gait and Station: stands from seated position without difficulty. Stance is normal. Gait demonstrates normal stride length and balance with use of RW. Tandem walk and heel toe not attempted.  Reflexes: 1+ and symmetric. Toes downgoing.        ASSESSMENT: Royale Swamy is a 65 y.o. year old male with recent acute left cerebellar and right thalamic ischemic infarct likely due to L VA occlusion possible dissection versus arthrosclerosis on 12/18/2020.  Vascular risk factors include  HTN, HLD, EtOH use, former tobacco use, L VA stenosis and bilateral carotid stenosis.     PLAN:  L cerebellar, right thalamic strokes:  Residual deficit: gait impairment with imbalance and occasional dizziness. Overall recovering well from stroke standpoint. Continue working with therapies for hopeful ongoing recovery. left hand numbness and gait impairment with imbalance.  Continue clopidogrel 75 mg daily  and atorvastatin 80 mg daily for secondary stroke prevention.  Discussed secondary stroke prevention measures and importance of close PCP follow up for aggressive stroke risk factor management  L VA occlusion, carotid stenosis: repeat CTA showed improvement of L VA narrowing and continued 50% R ICA stenosis. Plan on CUS around 11/2021 for surveillance monitoring.  Right leg pain: symptoms appear consistent with S1 radiculopathy. Present over the past month. Recommend continue working with therapies and discussed regular stretching. Declines interest in medication use such as with gabapentin. Does not currently interfere with daily activity or functioning. May need to consider imaging in the future if symptoms persist or progress despite conservative measures. Denies bowl or bladder issues or cauda equina  HTN: BP goal <130/90.  Stable today on valsartan 80mg  daily per PCP HLD: LDL goal <70. Recent lipid panel by PCP  03/06/2021 LDL 80 on atorvastatin 80mg  daily per PCP Pre DMII: A1c goal<7.0. prior A1c 6.4 (12/2020).  Monitored by PCP Etoh use: currently drinking 6 beers/day. Discussed increased risk for excessive alcohol intake and do not recommend more than 1-2 drinks per day.     Follow up in 5 months or call earlier if needed   CC:  PCP: Josetta Huddle, MD    I spent 42 minutes of face-to-face and non-face-to-face time with patient and wife.  This included previsit chart review, lab review, study review, electronic health record documentation, patient education regarding stroke including etiology as well as secondary stroke prevention measures and importance of aggressive stroke risk factor management including HTN, HLD, pre-DM, surveillance monitoring of extracranial stenosis, residual deficits and typical recovery time and answered all other questions to patients and wife's satisfaction  [name], AGNP-BC  [facility] [address] [place], [place] [address]  Phone [phone] Fax [phone] Note: This document was prepared with digital dictation and possible smart phrase technology. Any transcriptional errors that result from this process are unintentional.

## 2021-06-04 ENCOUNTER — Other Ambulatory Visit (HOSPITAL_COMMUNITY): Payer: Self-pay

## 2021-06-10 DIAGNOSIS — S82202A Unspecified fracture of shaft of left tibia, initial encounter for closed fracture: Secondary | ICD-10-CM | POA: Diagnosis not present

## 2021-06-11 ENCOUNTER — Ambulatory Visit: Payer: BC Managed Care – PPO | Admitting: Physical Medicine & Rehabilitation

## 2021-06-15 ENCOUNTER — Ambulatory Visit: Payer: BC Managed Care – PPO | Admitting: Physical Therapy

## 2021-06-15 ENCOUNTER — Other Ambulatory Visit: Payer: Self-pay

## 2021-06-15 ENCOUNTER — Encounter: Payer: BC Managed Care – PPO | Attending: Registered Nurse | Admitting: Physical Medicine & Rehabilitation

## 2021-06-15 ENCOUNTER — Encounter: Payer: Self-pay | Admitting: Physical Medicine & Rehabilitation

## 2021-06-15 ENCOUNTER — Other Ambulatory Visit (HOSPITAL_COMMUNITY): Payer: Self-pay

## 2021-06-15 ENCOUNTER — Encounter: Payer: Self-pay | Admitting: Physical Therapy

## 2021-06-15 VITALS — BP 114/79 | HR 87 | Temp 98.2°F | Ht 72.0 in | Wt 205.0 lb

## 2021-06-15 DIAGNOSIS — M5440 Lumbago with sciatica, unspecified side: Secondary | ICD-10-CM | POA: Diagnosis not present

## 2021-06-15 DIAGNOSIS — R29818 Other symptoms and signs involving the nervous system: Secondary | ICD-10-CM

## 2021-06-15 DIAGNOSIS — G8929 Other chronic pain: Secondary | ICD-10-CM | POA: Diagnosis not present

## 2021-06-15 DIAGNOSIS — R2689 Other abnormalities of gait and mobility: Secondary | ICD-10-CM | POA: Insufficient documentation

## 2021-06-15 DIAGNOSIS — R42 Dizziness and giddiness: Secondary | ICD-10-CM | POA: Insufficient documentation

## 2021-06-15 DIAGNOSIS — M6281 Muscle weakness (generalized): Secondary | ICD-10-CM | POA: Insufficient documentation

## 2021-06-15 DIAGNOSIS — R2681 Unsteadiness on feet: Secondary | ICD-10-CM

## 2021-06-15 DIAGNOSIS — R260 Ataxic gait: Secondary | ICD-10-CM | POA: Insufficient documentation

## 2021-06-15 MED ORDER — PREGABALIN 50 MG PO CAPS
50.0000 mg | ORAL_CAPSULE | Freq: Two times a day (BID) | ORAL | 1 refills | Status: DC
  Filled 2021-06-15: qty 60, 30d supply, fill #0
  Filled 2021-07-20: qty 60, 30d supply, fill #1

## 2021-06-15 NOTE — Therapy (Signed)
[facility] [address] [place], [place], [address] Phone: [phone]   Fax:  [phone]  Physical Therapy Treatment  Patient Details  Name: Duayne Brideau MRN: [id number] Date of Birth: [birth year] Referring Provider (PT): Dr. Marcelino Scot (now followed by Mena Goes)   Encounter Date: 06/15/2021   PT End of Session - 06/15/21 0813     Visit Number 27    Number of Visits 32    Date for PT Re-Evaluation 79/02/40   per recert on 9/73/53   Authorization Type [employer]BS (03/05/21-03/04/22)    Authorization - Visit Number 23    Authorization - Number of Visits 30    PT Start Time 0805    PT Stop Time 0845    PT Time Calculation (min) 40 min    Equipment Utilized During Treatment Gait belt    Activity Tolerance Patient tolerated treatment well;No increased pain    Behavior During Therapy WFL for tasks assessed/performed             Past Medical History:  Diagnosis Date   Cervical dystonia    Depression    Erectile dysfunction due to arterial insufficiency    Functional dyspepsia    Hypertension    Pancreatitis 2005   likely secondary to alcohol use   Rosacea     Past Surgical History:  Procedure Laterality Date   no past surgery     TIBIA IM NAIL INSERTION Left 02/07/2021   Procedure: INTRAMEDULLARY (IM) NAIL TIBIAL;  Surgeon: Altamese Etna, MD;  Location: [facility];  Service: Orthopedics;  Laterality: Left;    There were no vitals filed for this visit.   Subjective Assessment - 06/15/21 0809     Subjective Had a significant dizzy episode on the way to [place], had to use wheelchair to get into hotel. Did take a Meclazine a this time as well. BP was normal as spouse checked it. No other issues besides this one. Did a lot of walking while in [place]. Spouse reports he does still get the left lean and veering toward left as day progresses and he fatigues. Continues to report that Sciatic pain gets worse as day progresses  as well which causes his right knee to buckle. None right now.  See's Dr. Letta Pate today.    Patient is accompained by: Family member   spouse   Pertinent History [name]. history of cervical dystonia and receives Xeomin injections in the past followed by neurology service Dr. Evelena Leyden at [facility], hypertension, depression, tobacco and alcohol use    How long can you walk comfortably? farthest walk in hospital was 15' with RW.    Diagnostic tests MRI showed acute infarct left inferior posterior cerebellum.  Small acute infarct right thalamus.    Patient Stated Goals build up his legs and arms, walking again    Currently in Pain? No/denies                [facility] PT Assessment - 06/15/21 0814       Standardized Balance Assessment   Standardized Balance Assessment Dynamic Gait Index      Dynamic Gait Index   Level Surface Mild Impairment    Change in Gait Speed Mild Impairment    Gait with Horizontal Head Turns Moderate Impairment    Gait with Vertical Head Turns Moderate Impairment    Gait and Pivot Turn Moderate Impairment    Step Over Obstacle Mild Impairment    Step Around Obstacles Normal  Steps Mild Impairment    Total Score 14    DGI comment: 14/24                  [facility] Adult PT Treatment/Exercise - 06/15/21 0814       Transfers   Transfers Sit to Stand;Stand to Sit    Sit to Stand 5: Supervision;With upper extremity assist;4: Min guard    Stand to Sit 5: Supervision;With upper extremity assist;To bed;To chair/3-in-1      Ambulation/Gait   Ambulation/Gait Yes    Ambulation/Gait Assistance 4: Min guard    Ambulation Distance (Feet) --   around clinic with session   Assistive device Straight cane    Gait Pattern Decreased weight shift to left;Step-through pattern;Ataxic    Ambulation Surface Level;Indoor                 Balance Exercises - 06/15/21 0824       Balance Exercises: Standing   Standing Eyes Closed  Foam/compliant surface;Narrow base of support (BOS);Wide (BOA);Other reps (comment);Head turns;30 secs;Limitations    Standing Eyes Closed Limitations on airex in corner with no UE support: feet together for EC 30 sec's x 3 reps, then with feet hip width apart for EC head movements left<>right, up<>down and diagonal both ways for ~10 reps each. min guard to min assist for b balance with cues on posture/weight shifting to assist with balance.    Partial Tandem Stance Foam/compliant surface;Eyes closed;3 reps;30 secs;Limitations    Partial Tandem Stance Limitations on airex with no UE support: 3 reps each foot forwad with min assist needed for balance.                  PT Short Term Goals - 05/27/21 1148       PT SHORT TERM GOAL #1   Title Pt will undergo further assessment of DGI with LTG written in order to demo decr fall risk. ALL STGS DUE 06/26/21    Baseline not yet assessed.    Time 4    Period Weeks    Status Partially Met    Target Date 06/24/21      PT SHORT TERM GOAL #2   Title Pt will ascend/descend 12 steps with single handrail with supervision with step to vs. step through pattern in order to demo improved safety with stairs at home.    Time 4    Period Weeks    Status New      PT SHORT TERM GOAL #3   Title Pt will ambulate at least 230' over indoor level surfaces with cane with supervision in order to demo improved household mobility.    Baseline min guard on 05/03/21    Time 4    Period Weeks    Status New               PT Long Term Goals - 05/27/21 1156       PT LONG TERM GOAL #1   Title Pt will improve gait speed with SPC to at least 2.5 ft/sec for improved gait efficiency. ALL LTGS DUE 07/22/21    Baseline 05/25/21: 2.30 ft/sec with cane    Time 8    Period Weeks    Status New    Target Date 07/22/21      PT LONG TERM GOAL #2   Title Pt will ambulate at least 200' with SPC over outdoor unlevel surfaces with supervision in order to demo improved  community mobility.  Baseline , min guard assist with cane    Time 8    Period Weeks    Status Revised      PT LONG TERM GOAL #3   Title Pt will perform TUG with SPC with supervision in 14 seconds or less with SPC and supervision in order to demo decr fall risk.    Baseline 05/25/21: 14.06 sec's with cane with min guard    Time 8    Period Weeks    Status Revised      PT LONG TERM GOAL #4   Title DGI goal to be written as appropriate in order to demo decr fall risk.    Time 8    Period Weeks    Status New                   Plan - 06/15/21 0813     Clinical Impression Statement Today's skilled session initially focused on establishing baseline score for DGI with pt scoring 13/24 (used cane). Primary PT to update goals. Remainder of session continued to focus on balance training on compliant surfaces with vision removed. No dizziness or other symptoms reported, min guard to min assist needed for balance. No other issues noted or reported in session.    Personal Factors and Comorbidities Comorbidity 3+;Past/Current Experience    Comorbidities Cervical dystonia, HTN, depression, anxiety.    Examination-Activity Limitations Stairs;Stand;Squat;Locomotion Level;Transfers;Bend    Examination-Participation Restrictions Community Activity;Driving;Yard Work;Occupation    Stability/Clinical Decision Making Evolving/Moderate complexity    Rehab Potential Good    PT Frequency 2x / week   1-2x a week - 9 remaining visits over 8 weeks   PT Duration 8 weeks   1-2x a week - 9 remaining visits over 8 weeks   PT Treatment/Interventions ADLs/Self Care Home Management;Stair training;Gait training;DME Instruction;Functional mobility training;Therapeutic activities;Therapeutic exercise;Neuromuscular re-education;Balance training;Patient/family education;Vestibular;Manual techniques;Passive range of motion    PT Next Visit Plan gait/balance with head movements. continue to work on gait with cane.  balance on compliant surfaces and incr vestibular input. SLS tasks/ how is sciatic pain?    PT Home Exercise Plan Access Code [email or web address]    Consulted and Agree with Plan of Care Patient    Family Member Consulted pt's wife             Patient will benefit from skilled therapeutic intervention in order to improve the following deficits and impairments:  Abnormal gait, Decreased coordination, Decreased balance, Decreased safety awareness, Decreased strength, Difficulty walking, Dizziness, Decreased activity tolerance  Visit Diagnosis: Other abnormalities of gait and mobility  Unsteadiness on feet  Other symptoms and signs involving the nervous system  Dizziness and giddiness  Ataxic gait     Problem List Patient Active Problem List   Diagnosis Date Noted   Fall 02/05/2021   Fracture tibia/fibula, left, closed, initial encounter 02/05/2021   Cerebrovascular accident (CVA) of right thalamus ([employer]) 12/31/2020   Cerebral infarction due to embolism of left cerebellar artery ([place]) 12/31/2020   Alcohol use 12/19/2020   Acute CVA (cerebrovascular accident) ([place]) 12/18/2020   Depression with anxiety 12/18/2020   Essential hypertension 12/18/2020   Isolated cervical dystonia 12/06/2016    Willow Ora, PTA, [facility] [address], [place], [place] [address] [phone] 06/15/21, 1:10 PM   Name: Mae Cianci MRN: [id number] Date of Birth: Jan 12, 1956

## 2021-06-15 NOTE — Patient Instructions (Addendum)
Voltaren gel,Diclofenac gel.  Right knee QID

## 2021-06-15 NOTE — Progress Notes (Signed)
Subjective:    Patient ID: Miguel [name], male    DOB: 1955/09/21, 65 y.o.   MRN: [id number] 65 y.o. right-handed male with history of cervical dystonia and receives Xeomin injections in the past followed by neurology service Dr. Evelena Leyden at [facility], hypertension, depression, tobacco and alcohol use.  Per chart review lives with spouse independent prior to admission.  Two-level home half bath on main level.  Patient recently lost job 3 weeks prior.  Presented 12/18/2020 with persistent dizziness x1 week as well as left-sided ataxia.  Patient had reported on 12/16/2020 while riding his lawnmower and struck a tree without loss of consciousness.  Cranial CT scan showed no acute traumatic injury identified.  CT cervical spine no acute traumatic injury.  Patient did not receive tPA.  MRI showed acute infarct left inferior posterior cerebellum.  Small acute infarct right thalamus.  CT angiogram head and neck showed a moderate to severe stenosis origin of the left vertebral artery.  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  Atherosclerosis disease in the carotid bifurcation bilaterally 50% diameter stenosis proximal right internal carotid artery.  No significant left carotid stenosis.  Echocardiogram with ejection fraction 45 to 50% mild global hypokinesis, mild MR without AS.  Admission chemistries unremarkable except sodium 134 glucose 165 urinalysis negative nitrite urine drug screen positive marijuana.  Maintained on Plavix for CVA prophylaxis as well as Pletal 100 mg twice daily x3 weeks then Plavix alone.    Admit date: 12/31/2020 Discharge date: 01/14/2021 HPI Hx tib/fib fx 02/07/21 s/p IM nail Dr Marcelino Scot S/p Botox for cervical dystonia Driving short distances Sciatic pain- RLE , but no back pain  Involuntary leg movements at night, no history of restless leg syndrome.  Has had knee pain on RIght had Bilateral Xray in March 17/2022 Mild to moderate tricompartmental OA of both knees  The  patient denies any numbness in the right lower extremity.  He does complain of some weakness in the right lower.  In terms of his stroke deficits he still feels like he leans to the left side and we discussed that this is normal after a left cerebellar stroke.  Pain Inventory Average Pain 4 Pain Right Now 0 My pain is intermittent, sharp, and aching  LOCATION OF PAIN  neck, buttocks, right thigh, right knee, right leg, right ankle  BOWEL Number of stools per week: 4-5    BLADDER Normal    Mobility walk with assistance use a cane how many minutes can you walk? 5-10 minutes ability to climb steps?  yes do you drive?  yes use a wheelchair needs help with transfers Do you have any goals in this area?  yes  Function retired I need assistance with the following:  meal prep, household duties, and shopping Do you have any goals in this area?  yes  Neuro/Psych weakness numbness trouble walking spasms dizziness depression  Prior Studies Any changes since last visit?  yes x-rays CT/MRI  Physicians involved in your care Any changes since last visit?  no   Family History  Problem Relation Age of Onset   Uterine cancer Mother    Emphysema Father    Social History   Socioeconomic History   Marital status: Married    Spouse name: Not on file   Number of children: 0   Years of education: some college   Highest education level: Not on file  Occupational History   Occupation: Associate Professor  Tobacco Use   Smoking status: Former  Smokeless tobacco: Never  Vaping Use   Vaping Use: Never used  Substance and Sexual Activity   Alcohol use: Yes    Comment: 3 beers per day   Drug use: No   Sexual activity: Not on file  Other Topics Concern   Not on file  Social History Narrative   Lives at home with his wife.   3 cups of caffeine day.   Right-handed.   Social Determinants of Health   Financial Resource Strain: Not on file  Food Insecurity: Not on file   Transportation Needs: Not on file  Physical Activity: Not on file  Stress: Not on file  Social Connections: Not on file   Past Surgical History:  Procedure Laterality Date   no past surgery     TIBIA IM NAIL INSERTION Left 02/07/2021   Procedure: INTRAMEDULLARY (IM) NAIL TIBIAL;  Surgeon: Altamese Brown Deer, MD;  Location: [facility];  Service: Orthopedics;  Laterality: Left;   Past Medical History:  Diagnosis Date   Cervical dystonia    Depression    Erectile dysfunction due to arterial insufficiency    Functional dyspepsia    Hypertension    Pancreatitis 2005   likely secondary to alcohol use   Rosacea    BP 114/79   Pulse 87   Temp 98.2 F (36.8 C)   Ht 6' (1.829 m)   Wt 205 lb (93 kg)   SpO2 95%   BMI 27.80 kg/m   Opioid Risk Score:   Fall Risk Score:  `1  Depression screen PHQ 2/9  Depression screen [facility] 2/9 06/15/2021 03/05/2021 01/27/2021  Decreased Interest 1 3 3   Down, Depressed, Hopeless 1 2 2   PHQ - 2 Score 2 5 5   Altered sleeping - - 0  Tired, decreased energy - - 1  Change in appetite - - 0  Feeling bad or failure about yourself  - - 1  Trouble concentrating - - 1  Moving slowly or fidgety/restless - - 0  Suicidal thoughts - - 0  PHQ-9 Score - - 8  Difficult doing work/chores - - Very difficult    Review of Systems  Cardiovascular:  Positive for leg swelling.  Gastrointestinal:  Positive for nausea.  Musculoskeletal:  Positive for gait problem.       Spasms  Neurological:  Positive for dizziness, weakness and numbness.  Psychiatric/Behavioral:         Depression  All other systems reviewed and are negative.     Objective:   Physical Exam Constitutional:      Appearance: Normal appearance.  HENT:     Head: Normocephalic and atraumatic.  Eyes:     Extraocular Movements: Extraocular movements intact.     Conjunctiva/sclera: Conjunctivae normal.     Pupils: Pupils are equal, round, and reactive to light.  Musculoskeletal:     Comments: Pain with  palpation he does have some radiating pain down the right lower extremity when leaning to the right side.  Reduced lumbar spine range of motion around 50% flexion and extension.  Skin:    General: Skin is warm and dry.  Neurological:     Mental Status: He is alert and oriented to person, place, and time.     Comments: No evidence of dysmetria finger-nose-finger on the right side, mild dysmetria left finger-nose-finger Standing balance is fair he tends to lean toward the left. Ambulates with a cane with standby assistance no evidence of toe drag or knee instability Sensation normal to pinprick bilateral lower  extremities Deep tendon reflexes are normal bilateral lower extremities Negative straight leg raising bilateral lower extremities  Psychiatric:        Mood and Affect: Mood normal.        Behavior: Behavior normal.          Assessment & Plan:  1.  History of left cerebellar stroke with resultant ataxia of gait.  This will likely be a chronic issue.  Continue outpatient PT 2.  Low back pain with sciatic type discomfort right lower extremity.  He may have lumbar stenosis he has had no lumbar imaging studies.  We will start pregabalin 50 mg twice daily may need to titrate upward. X-rays of lumbar spine Return to clinic in 6 weeks

## 2021-06-16 ENCOUNTER — Other Ambulatory Visit (HOSPITAL_COMMUNITY): Payer: Self-pay

## 2021-06-17 ENCOUNTER — Other Ambulatory Visit: Payer: Self-pay

## 2021-06-17 ENCOUNTER — Ambulatory Visit: Payer: BC Managed Care – PPO | Admitting: Physical Therapy

## 2021-06-17 ENCOUNTER — Ambulatory Visit
Admission: RE | Admit: 2021-06-17 | Discharge: 2021-06-17 | Disposition: A | Discharge status: Home / Self Care | Payer: BC Managed Care – PPO | Source: Ambulatory Visit | Attending: Physical Medicine & Rehabilitation | Admitting: Physical Medicine & Rehabilitation

## 2021-06-17 ENCOUNTER — Encounter: Payer: Self-pay | Admitting: Physical Therapy

## 2021-06-17 ENCOUNTER — Other Ambulatory Visit (HOSPITAL_COMMUNITY): Payer: Self-pay

## 2021-06-17 DIAGNOSIS — R2681 Unsteadiness on feet: Secondary | ICD-10-CM

## 2021-06-17 DIAGNOSIS — M6281 Muscle weakness (generalized): Secondary | ICD-10-CM

## 2021-06-17 DIAGNOSIS — M545 Low back pain, unspecified: Secondary | ICD-10-CM | POA: Diagnosis not present

## 2021-06-17 DIAGNOSIS — G8929 Other chronic pain: Secondary | ICD-10-CM

## 2021-06-17 DIAGNOSIS — M5440 Lumbago with sciatica, unspecified side: Secondary | ICD-10-CM | POA: Diagnosis not present

## 2021-06-17 DIAGNOSIS — R2689 Other abnormalities of gait and mobility: Secondary | ICD-10-CM

## 2021-06-17 MED ORDER — VALSARTAN 80 MG PO TABS
80.0000 mg | ORAL_TABLET | Freq: Every day | ORAL | 2 refills | Status: AC
  Filled 2021-06-17: qty 90, 90d supply, fill #0
  Filled 2021-07-21: qty 90, 90d supply, fill #1

## 2021-06-18 ENCOUNTER — Ambulatory Visit: Payer: BC Managed Care – PPO | Admitting: Physical Therapy

## 2021-06-20 NOTE — Therapy (Signed)
[facility] [address] [place], [place], [address] Phone: [phone]   Fax:  [phone]  Physical Therapy Treatment  Patient Details  Name: Miguel [name] MRN: [id number] Date of Birth: 07/25/1956 Referring Provider (PT): Dr. Marcelino [name] (now followed by Miguel [name])   Encounter Date: 06/17/2021     06/17/21 0726  PT Visits / Re-Eval  Visit Number 28  Number of Visits 32  Date for PT Re-Evaluation 87/56/43 (per recert on 12/01/49)  Authorization  Authorization Type [employer] (03/05/21-03/04/22)  Authorization - Visit Number 24  Authorization - Number of Visits 30  PT Time Calculation  PT Start Time 0720  PT Stop Time 0800  PT Time Calculation (min) 40 min  PT - End of Session  Equipment Utilized During Treatment Gait belt  Activity Tolerance Patient tolerated treatment well;No increased pain  Behavior During Therapy WFL for tasks assessed/performed   Past Medical History:  Diagnosis Date   Cervical dystonia    Depression    Erectile dysfunction due to arterial insufficiency    Functional dyspepsia    Hypertension    Pancreatitis 2005   likely secondary to alcohol use   Rosacea     Past Surgical History:  Procedure Laterality Date   no past surgery     TIBIA IM NAIL INSERTION Left 02/07/2021   Procedure: INTRAMEDULLARY (IM) NAIL TIBIAL;  Surgeon: Miguel Strang, MD;  Location: [facility];  Service: Orthopedics;  Laterality: Left;    There were no vitals filed for this visit.     06/17/21 0723  Symptoms/Limitations  Subjective No new complaints. Has woken up the past two mornings with dizziness, gets better as day progresses. Continues to also get increased left LE weakness as day progressess. Dr. Letta [name] placed him on Lyrica for sciatic pain. Also getting an xray of back today.  Patient is accompained by: Family member (spouse)  Pertinent History [name] BY Miguel [name]. history of cervical dystonia and receives Xeomin  injections in the past followed by neurology service Dr. Evelena [name] at [facility], hypertension, depression, tobacco and alcohol use  How long can you walk comfortably? farthest walk in hospital was 9' with RW.  Diagnostic tests MRI showed acute infarct left inferior posterior cerebellum.  Small acute infarct right thalamus.  Patient Stated Goals build up his legs and arms, walking again  Pain Assessment  Currently in Pain? No/denies  Pain Score 0      06/17/21 0728  Transfers  Transfers Sit to Stand;Stand to Sit  Sit to Stand 5: Supervision;With upper extremity assist;4: Min guard  Stand to Sit 5: Supervision;With upper extremity assist;To bed;To chair/3-in-1  Ambulation/Gait  Ambulation/Gait Yes  Ambulation/Gait Assistance 4: Min guard;5: Supervision  Ambulation Distance (Feet)  (around gym with session)  Assistive device Straight cane  Gait Pattern Decreased weight shift to left;Step-through pattern;Ataxic  Ambulation Surface Level;Indoor  High Level Balance  High Level Balance Activities Side stepping;Marching forwards;Marching backwards;Tandem walking (tandem gait forward/backwards)  High Level Balance Comments on red/blue mats next to counter top: 3 laps each/each way with min guard to min assist for balance. cues on posture/weight shifting. single UE support.  Neuro Re-ed   Neuro Re-ed Details  for strengthening/muscle re-ed: tall kneeling on mat table- green band resisted mini squats for 10 reps each. then with resistance in lateral direction on thigh: moving leg out against resistance/back in for 10 reps each side, then with lateral pull at hip- pt working on stability in tall kneeling with head movements  left<>right, then up<>down for ~10 reps each,  Knee/Hip Exercises: Aerobic  Other Aerobic Scifit LE's only level 6.5 x 6 minutes with goal at 65-70 steps per minute for strengthening and activity tolerance, ROM.        PT Short Term Goals - 05/27/21 1148        PT SHORT TERM GOAL #1   Title Pt will undergo further assessment of DGI with LTG written in order to demo decr fall risk. ALL STGS DUE 06/26/21    Baseline not yet assessed.    Time 4    Period Weeks    Status Partially Met    Target Date 06/24/21      PT SHORT TERM GOAL #2   Title Pt will ascend/descend 12 steps with single handrail with supervision with step to vs. step through pattern in order to demo improved safety with stairs at home.    Time 4    Period Weeks    Status New      PT SHORT TERM GOAL #3   Title Pt will ambulate at least 230' over indoor level surfaces with cane with supervision in order to demo improved household mobility.    Baseline min guard on 05/03/21    Time 4    Period Weeks    Status New               PT Long Term Goals - 05/27/21 1156       PT LONG TERM GOAL #1   Title Pt will improve gait speed with SPC to at least 2.5 ft/sec for improved gait efficiency. ALL LTGS DUE 07/22/21    Baseline 05/25/21: 2.30 ft/sec with cane    Time 8    Period Weeks    Status New    Target Date 07/22/21      PT LONG TERM GOAL #2   Title Pt will ambulate at least 200' with SPC over outdoor unlevel surfaces with supervision in order to demo improved community mobility.    Baseline , min guard assist with cane    Time 8    Period Weeks    Status Revised      PT LONG TERM GOAL #3   Title Pt will perform TUG with SPC with supervision in 14 seconds or less with SPC and supervision in order to demo decr fall risk.    Baseline 05/25/21: 14.06 sec's with cane with min guard    Time 8    Period Weeks    Status Revised      PT LONG TERM GOAL #4   Title DGI goal to be written as appropriate in order to demo decr fall risk.    Time 8    Period Weeks    Status New              06/17/21 0727  Plan  Clinical Impression Statement Todat's skilled session focused on strengthening and balance training with rest breaks taken as needed. No other issues noted  or reported in session.  Personal Factors and Comorbidities Comorbidity 3+;Past/Current Experience  Comorbidities Cervical dystonia, HTN, depression, anxiety.  Examination-Activity Limitations Stairs;Stand;Squat;Locomotion Level;Transfers;Bend  Examination-Participation Restrictions Community Activity;Driving;Yard Work;Occupation  Pt will benefit from skilled therapeutic intervention in order to improve on the following deficits Abnormal gait;Decreased coordination;Decreased balance;Decreased safety awareness;Decreased strength;Difficulty walking;Dizziness;Decreased activity tolerance  Stability/Clinical Decision Making Evolving/Moderate complexity  Rehab Potential Good  PT Frequency 2x / week (1-2x a week - 9 remaining visits over 8 weeks)  PT Duration 8 weeks (1-2x a week - 9 remaining visits over 8 weeks)  PT Treatment/Interventions ADLs/Self Care Home Management;Stair training;Gait training;DME Instruction;Functional mobility training;Therapeutic activities;Therapeutic exercise;Neuromuscular re-education;Balance training;Patient/family education;Vestibular;Manual techniques;Passive range of motion  PT Next Visit Plan gait/balance with head movements. continue to work on gait with cane. balance on compliant surfaces and incr vestibular input. SLS tasks/ how is sciatic pain?  PT Home Exercise Plan Access Code [email or web address]  Consulted and Agree with Plan of Care Patient  Family Member Consulted pt's wife          Patient will benefit from skilled therapeutic intervention in order to improve the following deficits and impairments:  Abnormal gait, Decreased coordination, Decreased balance, Decreased safety awareness, Decreased strength, Difficulty walking, Dizziness, Decreased activity tolerance  Visit Diagnosis: Other abnormalities of gait and mobility  Unsteadiness on feet  Muscle weakness (generalized)     Problem List Patient Active Problem List   Diagnosis Date Noted   Fall  02/05/2021   Fracture tibia/fibula, left, closed, initial encounter 02/05/2021   Cerebrovascular accident (CVA) of right thalamus ([facility]) 12/31/2020   Cerebral infarction due to embolism of left cerebellar artery ([facility]) 12/31/2020   Alcohol use 12/19/2020   Acute CVA (cerebrovascular accident) ([facility]) 12/18/2020   Depression with anxiety 12/18/2020   Essential hypertension 12/18/2020   Isolated cervical dystonia 12/06/2016   Willow Ora, PTA, [facility] [address], [place], [place] [address] [phone] 06/20/21, 8:42 PM  Name: Emmaus Brandi MRN: [id number] Date of Birth: 09/14/1955

## 2021-06-22 ENCOUNTER — Other Ambulatory Visit (HOSPITAL_COMMUNITY): Payer: Self-pay

## 2021-06-23 ENCOUNTER — Other Ambulatory Visit (HOSPITAL_COMMUNITY): Payer: Self-pay

## 2021-06-23 ENCOUNTER — Encounter: Payer: Self-pay | Admitting: Physical Therapy

## 2021-06-23 ENCOUNTER — Ambulatory Visit: Payer: BC Managed Care – PPO | Admitting: Physical Therapy

## 2021-06-23 ENCOUNTER — Other Ambulatory Visit (HOSPITAL_COMMUNITY): Payer: Self-pay | Admitting: Physician Assistant

## 2021-06-23 ENCOUNTER — Other Ambulatory Visit: Payer: Self-pay

## 2021-06-23 DIAGNOSIS — R260 Ataxic gait: Secondary | ICD-10-CM

## 2021-06-23 DIAGNOSIS — R42 Dizziness and giddiness: Secondary | ICD-10-CM

## 2021-06-23 DIAGNOSIS — R2681 Unsteadiness on feet: Secondary | ICD-10-CM

## 2021-06-23 DIAGNOSIS — M6281 Muscle weakness (generalized): Secondary | ICD-10-CM

## 2021-06-23 DIAGNOSIS — M5440 Lumbago with sciatica, unspecified side: Secondary | ICD-10-CM | POA: Diagnosis not present

## 2021-06-23 DIAGNOSIS — G8929 Other chronic pain: Secondary | ICD-10-CM | POA: Diagnosis not present

## 2021-06-23 DIAGNOSIS — R2689 Other abnormalities of gait and mobility: Secondary | ICD-10-CM

## 2021-06-23 DIAGNOSIS — R29818 Other symptoms and signs involving the nervous system: Secondary | ICD-10-CM

## 2021-06-23 NOTE — Therapy (Signed)
[facility] [address] [place], [place], [address] Phone: [phone]   Fax:  [phone]  Physical Therapy Treatment  Patient Details  Name: Miguel [name] MRN: [id number] Date of Birth: [birth year] Referring Provider (PT): Dr. Marcelino [name] (now followed by Miguel [name])   Encounter Date: 06/23/2021   PT End of Session - 06/23/21 0818     Visit Number 29    Number of Visits 32    Date for PT Re-Evaluation 34/28/76   per recert on 04/15/56   Authorization Type BCBS (03/05/21-03/04/22)    Authorization - Visit Number 25    Authorization - Number of Visits 30    PT Start Time 0803    PT Stop Time 2620    PT Time Calculation (min) 46 min    Equipment Utilized During Treatment Gait belt    Activity Tolerance Patient tolerated treatment well;No increased pain    Behavior During Therapy WFL for tasks assessed/performed             Past Medical History:  Diagnosis Date   Cervical dystonia    Depression    Erectile dysfunction due to arterial insufficiency    Functional dyspepsia    Hypertension    Pancreatitis 2005   likely secondary to alcohol use   Rosacea     Past Surgical History:  Procedure Laterality Date   no past surgery     TIBIA IM NAIL INSERTION Left 02/07/2021   Procedure: INTRAMEDULLARY (IM) NAIL TIBIAL;  Surgeon: Miguel [place], MD;  Location: [facility];  Service: Orthopedics;  Laterality: Left;    There were no vitals filed for this visit.   Subjective Assessment - 06/23/21 0807     Subjective Has had at least 5 episodes of dizziness where he had to "sit down". Takes about 30 sec's to a minute to clear, then followed by headaches for about 15 sec's. Has been taking the Lyrica once a day for sciatic pain, wears off during the day. Was prescribed for two times a day, plans to increase to this dosage.    Patient is accompained by: Family member   spouse   Pertinent History [name] BY Miguel [name]. history of cervical  dystonia and receives Xeomin injections in the past followed by neurology service Dr. Evelena [name] at [facility], hypertension, depression, tobacco and alcohol use    How long can you walk comfortably? farthest walk in hospital was 29' with RW.    Diagnostic tests MRI showed acute infarct left inferior posterior cerebellum.  Small acute infarct right thalamus.    Patient Stated Goals build up his legs and arms, walking again    Currently in Pain? Yes    Pain Score 6     Pain Location Back    Pain Orientation Right    Pain Descriptors / Indicators Aching;Sore;Shooting    Pain Type Chronic pain;Neuropathic pain    Pain Radiating Towards sciatica pain    Pain Onset More than a month ago    Pain Frequency Constant    Aggravating Factors  increased activity    Pain Relieving Factors rest, tylenol, stretching                      [facility] Adult PT Treatment/Exercise - 06/23/21 0818       Transfers   Transfers Sit to Stand;Stand to Sit    Sit to Stand 5: Supervision;With upper extremity assist;4: Min guard    Stand to Sit 5: Supervision;With  upper extremity assist;To bed;To chair/3-in-1      Ambulation/Gait   Ambulation/Gait Yes    Ambulation/Gait Assistance 4: Min guard;5: Supervision    Ambulation/Gait Assistance Details mostly supervision, min guard for several feet as pt carried cane with gait vs using it. no balance issues noted, veering noted.    Ambulation Distance (Feet) 345 Feet   x1, plus around clinic with session   Assistive device Straight cane    Gait Pattern Decreased weight shift to left;Step-through pattern;Ataxic    Ambulation Surface Level;Indoor    Stairs Yes    Stairs Assistance 5: Supervision;4: Min guard    Stairs Assistance Details (indicate cue type and reason) 3 reps with single rail, no cane with pt looking forward vs at stairs with no dizziness, supervision. 4th rep with rail/cane combo with pt looking down, mild dizziness. Had pt slow down,  wait and allow for compensation    Stair Management Technique One rail Right;Alternating pattern;Forwards;With cane    Number of Stairs 5   x4 reps   Height of Stairs 6            Issued to HEP vestibular ex's for gaze stabilization/habituation. Mild symptoms reported in session that resolved with rest breaks.   Access Code: [email or web address] URL: [email or web address] Date: 06/23/2021 Prepared by: Miguel [name]  Exercises Seated Hamstring Stretch with Strap - 1 x daily - 5 x weekly - 1 sets - 3 reps - 30 hold Side Stepping with Resistance at Ankles and Counter Support - 1 x daily - 5 x weekly - 1 sets - 3 reps Tandem Walking with Counter Support - 1 x daily - 5 x weekly - 1 sets - 3 reps Standing Single Leg Stance with Counter Support - 1 x daily - 5 x weekly - 1 sets - 3 reps - 15 second hold Standing Near Stance in Corner with Eyes Closed - 1 x daily - 5 x weekly - 1 sets - 3 reps - 30 hold Standing Balance in Corner with Eyes Closed - 1 x daily - 5 x weekly - 1 sets - 10 reps  Added this date: Seated Gaze Stabilization with Head Rotation - 2 x daily - 5 x weekly - 1 sets - 2-3 reps - 20 seconds hold Seated Gaze Stabilization with Head Rotation and Horizontal Arm Movement - 1 x daily - 5 x weekly - 1 sets - 2-3 reps - 20 seconds hold        PT Education - 06/23/21 2127     Education Details added gaze stabilization/habituation ex's to HEP due to recent bouts of dizziness    Person(s) Educated Patient;Spouse    Methods Explanation;Demonstration;Verbal cues;Handout    Comprehension Verbalized understanding;Returned demonstration;Verbal cues required;Need further instruction              PT Short Term Goals - 06/23/21 2137       PT SHORT TERM GOAL #1   Title Pt will undergo further assessment of DGI with LTG written in order to demo decr fall risk. ALL STGS DUE 06/26/21    Baseline 06/15/21: 14/24 scored as baseline. PT to set LTG.    Time --    Period --     Status Achieved    Target Date --      PT SHORT TERM GOAL #2   Title Pt will ascend/descend 12 steps with single handrail with supervision with step to vs. step through pattern in order to demo improved safety with stairs at  home.    Baseline 06/23/21: met in session today    Time --    Period --    Status Achieved      PT SHORT TERM GOAL #3   Title Pt will ambulate at least 230' over indoor level surfaces with cane with supervision in order to demo improved household mobility.    Baseline 06/23/21: met this session    Time --    Period --    Status Achieved               PT Long Term Goals - 05/27/21 1156       PT LONG TERM GOAL #1   Title Pt will improve gait speed with SPC to at least 2.5 ft/sec for improved gait efficiency. ALL LTGS DUE 07/22/21    Baseline 05/25/21: 2.30 ft/sec with cane    Time 8    Period Weeks    Status New    Target Date 07/22/21      PT LONG TERM GOAL #2   Title Pt will ambulate at least 200' with SPC over outdoor unlevel surfaces with supervision in order to demo improved community mobility.    Baseline , min guard assist with cane    Time 8    Period Weeks    Status Revised      PT LONG TERM GOAL #3   Title Pt will perform TUG with SPC with supervision in 14 seconds or less with SPC and supervision in order to demo decr fall risk.    Baseline 05/25/21: 14.06 sec's with cane with min guard    Time 8    Period Weeks    Status Revised      PT LONG TERM GOAL #4   Title DGI goal to be written as appropriate in order to demo decr fall risk.    Time 8    Period Weeks    Status New                   Plan - 06/23/21 0818     Clinical Impression Statement Today's skilled session intially focused on progress toward remaining STGs with goals met. STG for DGI had already been met, PT to set LTGs. Remainder of session focused on additions to HEP for vestibular habituation/gaze stabilzation to address pt's continued dizziness. No  significant issues reported with new ex's with performance in session. The pt should benefit from continued PT to progress toward unmet goals.    Personal Factors and Comorbidities Comorbidity 3+;Past/Current Experience    Comorbidities Cervical dystonia, HTN, depression, anxiety.    Examination-Activity Limitations Stairs;Stand;Squat;Locomotion Level;Transfers;Bend    Examination-Participation Restrictions Community Activity;Driving;Yard Work;Occupation    Stability/Clinical Decision Making Evolving/Moderate complexity    Rehab Potential Good    PT Frequency 2x / week   1-2x a week - 9 remaining visits over 8 weeks   PT Duration 8 weeks   1-2x a week - 9 remaining visits over 8 weeks   PT Treatment/Interventions ADLs/Self Care Home Management;Stair training;Gait training;DME Instruction;Functional mobility training;Therapeutic activities;Therapeutic exercise;Neuromuscular re-education;Balance training;Patient/family education;Vestibular;Manual techniques;Passive range of motion    PT Next Visit Plan gait/balance with head movements. continue to work on gait with cane. balance on compliant surfaces and incr vestibular input. SLS tasks/ how is sciatic pain?    PT Home Exercise Plan Access Code [email or web address]    Consulted and Agree with Plan of Care Patient    Family Member Consulted pt's wife  Patient will benefit from skilled therapeutic intervention in order to improve the following deficits and impairments:  Abnormal gait, Decreased coordination, Decreased balance, Decreased safety awareness, Decreased strength, Difficulty walking, Dizziness, Decreased activity tolerance  Visit Diagnosis: Other abnormalities of gait and mobility  Unsteadiness on feet  Muscle weakness (generalized)  Dizziness and giddiness  Other symptoms and signs involving the nervous system  Ataxic gait     Problem List Patient Active Problem List   Diagnosis Date Noted   Fall 02/05/2021   Fracture  tibia/fibula, left, closed, initial encounter 02/05/2021   Cerebrovascular accident (CVA) of right thalamus ([place]) 12/31/2020   Cerebral infarction due to embolism of left cerebellar artery ([place]) 12/31/2020   Alcohol use 12/19/2020   Acute CVA (cerebrovascular accident) ([place]) 12/18/2020   Depression with anxiety 12/18/2020   Essential hypertension 12/18/2020   Isolated cervical dystonia 12/06/2016   Miguel [name], PTA, [facility] [address] [place], [place] [address] [phone] 06/23/21, 10:21 PM   Name: Miguel [name] MRN: [id number] Date of Birth: Jan 23, 1956

## 2021-06-23 NOTE — Patient Instructions (Signed)
Access Code: [email or web address] URL: [email or web address] Date: 06/23/2021 Prepared by: Willow Ora  Exercises Seated Hamstring Stretch with Strap - 1 x daily - 5 x weekly - 1 sets - 3 reps - 30 hold Side Stepping with Resistance at Ankles and Counter Support - 1 x daily - 5 x weekly - 1 sets - 3 reps Tandem Walking with Counter Support - 1 x daily - 5 x weekly - 1 sets - 3 reps Standing Single Leg Stance with Counter Support - 1 x daily - 5 x weekly - 1 sets - 3 reps - 15 second hold Standing Near Stance in Corner with Eyes Closed - 1 x daily - 5 x weekly - 1 sets - 3 reps - 30 hold Standing Balance in Corner with Eyes Closed - 1 x daily - 5 x weekly - 1 sets - 10 reps  Added this date: Seated Gaze Stabilization with Head Rotation - 2 x daily - 5 x weekly - 1 sets - 2-3 reps - 20 seconds hold Seated Gaze Stabilization with Head Rotation and Horizontal Arm Movement - 1 x daily - 5 x weekly - 1 sets - 2-3 reps - 20 seconds hold

## 2021-06-24 ENCOUNTER — Other Ambulatory Visit (HOSPITAL_COMMUNITY): Payer: Self-pay

## 2021-06-24 MED ORDER — CLONAZEPAM 1 MG PO TABS
ORAL_TABLET | ORAL | 1 refills | Status: AC
  Filled 2021-06-24: qty 90, 90d supply, fill #0

## 2021-06-25 ENCOUNTER — Encounter: Payer: Self-pay | Admitting: Physical Therapy

## 2021-06-25 ENCOUNTER — Other Ambulatory Visit (HOSPITAL_COMMUNITY): Payer: Self-pay

## 2021-06-25 ENCOUNTER — Ambulatory Visit: Payer: BC Managed Care – PPO | Admitting: Physical Therapy

## 2021-06-25 ENCOUNTER — Other Ambulatory Visit: Payer: Self-pay

## 2021-06-25 DIAGNOSIS — R2689 Other abnormalities of gait and mobility: Secondary | ICD-10-CM

## 2021-06-25 DIAGNOSIS — R2681 Unsteadiness on feet: Secondary | ICD-10-CM

## 2021-06-25 DIAGNOSIS — R42 Dizziness and giddiness: Secondary | ICD-10-CM

## 2021-06-25 DIAGNOSIS — M6281 Muscle weakness (generalized): Secondary | ICD-10-CM

## 2021-06-25 DIAGNOSIS — M5440 Lumbago with sciatica, unspecified side: Secondary | ICD-10-CM | POA: Diagnosis not present

## 2021-06-25 DIAGNOSIS — G8929 Other chronic pain: Secondary | ICD-10-CM | POA: Diagnosis not present

## 2021-06-25 NOTE — Therapy (Signed)
[facility] [address] [place], [place], [address] Phone: [phone]   Fax:  [phone]  Physical Therapy Treatment  Patient Details  Name: Miguel [name] MRN: [id number] Date of Birth: Jun 22, 1956 Referring Provider (PT): Dr. Marcelino Scot (now followed by Mena Miguel)   Encounter Date: 06/25/2021   PT End of Session - 06/25/21 0723     Visit Number 30    Number of Visits 32    Date for PT Re-Evaluation 12/75/17   per recert on 0/01/74   Authorization Type BCBS (03/05/21-03/04/22)    Authorization - Visit Number 26    Authorization - Number of Visits 30    PT Start Time 0719    PT Stop Time 0801    PT Time Calculation (min) 42 min    Equipment Utilized During Treatment Gait belt    Activity Tolerance Patient tolerated treatment well    Behavior During Therapy WFL for tasks assessed/performed             Past Medical History:  Diagnosis Date   Cervical dystonia    Depression    Erectile dysfunction due to arterial insufficiency    Functional dyspepsia    Hypertension    Pancreatitis 2005   likely secondary to alcohol use   Rosacea     Past Surgical History:  Procedure Laterality Date   no past surgery     TIBIA IM NAIL INSERTION Left 02/07/2021   Procedure: INTRAMEDULLARY (IM) NAIL TIBIAL;  Surgeon: Altamese [place], MD;  Location: [facility];  Service: Orthopedics;  Laterality: Left;    There were no vitals filed for this visit.   Subjective Assessment - 06/25/21 0721     Subjective VOR exercises are going well at home. Still having to use a wheelchair in the evenings - after legs get more tired.    Patient is accompained by: Family member   spouse   Pertinent History Miguel [name]. history of cervical dystonia and receives Xeomin injections in the past followed by neurology service Dr. Evelena Leyden at [facility], hypertension, depression, tobacco and alcohol use    How long can you walk comfortably?  farthest walk in hospital was 42' with RW.    Diagnostic tests MRI showed acute infarct left inferior posterior cerebellum.  Small acute infarct right thalamus.    Patient Stated Goals build up his legs and arms, walking again    Currently in Pain? No/denies    Pain Onset More than a month ago                               [facility] Adult PT Treatment/Exercise - 06/25/21 0001       Ambulation/Gait   Ambulation/Gait Yes    Ambulation/Gait Assistance 5: Supervision    Assistive device Straight cane    Gait Pattern Decreased weight shift to left;Step-through pattern;Ataxic    Ambulation Surface Level;Indoor      Neuro Re-ed    Neuro Re-ed Details  NMR: in corner on level ground with feet apart, visual scanning with ball: x5 reps horizontal rotations, x5 reps up and down, x5 reps diagonals in both directions - pt more unsteady with diagonals, but no dizziness. performed same activities on air ex, incr difficulty with diagonal in the direction up and to the R, needing wall intermittently for balance. then performed clockwise/counterclockwise slow circles x5 reps each direction, only mild dizziness noted  Vestibular Treatment/Exercise - 06/25/21 0001       Vestibular Treatment/Exercise   Vestibular Treatment Provided Gaze    Gaze Exercises X1 Viewing Vertical;X1 Viewing Horizontal      X1 Viewing Horizontal   Foot Position seated and then standing with feet apart    Comments 2 x 30 seconds, x60 seconds in sitting, 2 x 30 seconds in standing - progressed to HEP for standing with solid background and pt holding onto chair. no sx noted, cues for proper ROM and slowly incr speed      X1 Viewing Vertical   Foot Position seated and then standing with feet apart    Comments 2 x 30 seconds, x60 seconds in sitting, 2 x 30 seconds in standing - progressed to HEP for standing with solid background and pt holding onto chair. no sx noted, cues for proper ROM and slowly  incr speed                Balance Exercises - 06/25/21 0001       Balance Exercises: Standing   SLS Eyes open;Foam/compliant surface;Solid surface    SLS Limitations At bottom of staircase on level ground - alternating toe taps to 6" step x5 reps each side, then to 12" step with cues to look ahead, performed an additional x10 reps on air ex with min guard for balance                  PT Short Term Goals - 06/23/21 2137       PT SHORT TERM GOAL #1   Title Pt will undergo further assessment of DGI with LTG written in order to demo decr fall risk. ALL STGS DUE 06/26/21    Baseline 06/15/21: 14/24 scored as baseline. PT to set LTG.    Time --    Period --    Status Achieved    Target Date --      PT SHORT TERM GOAL #2   Title Pt will ascend/descend 12 steps with single handrail with supervision with step to vs. step through pattern in order to demo improved safety with stairs at home.    Baseline 06/23/21: met in session today    Time --    Period --    Status Achieved      PT SHORT TERM GOAL #3   Title Pt will ambulate at least 230' over indoor level surfaces with cane with supervision in order to demo improved household mobility.    Baseline 06/23/21: met this session    Time --    Period --    Status Achieved               PT Long Term Goals - 05/27/21 1156       PT LONG TERM GOAL #1   Title Pt will improve gait speed with SPC to at least 2.5 ft/sec for improved gait efficiency. ALL LTGS DUE 07/22/21    Baseline 05/25/21: 2.30 ft/sec with cane    Time 8    Period Weeks    Status New    Target Date 07/22/21      PT LONG TERM GOAL #2   Title Pt will ambulate at least 200' with SPC over outdoor unlevel surfaces with supervision in order to demo improved community mobility.    Baseline , min guard assist with cane    Time 8    Period Weeks    Status Revised      PT LONG  TERM GOAL #3   Title Pt will perform TUG with SPC with supervision in 14  seconds or less with SPC and supervision in order to demo decr fall risk.    Baseline 05/25/21: 14.06 sec's with cane with min guard    Time 8    Period Weeks    Status Revised      PT LONG TERM GOAL #4   Title DGI goal to be written as appropriate in order to demo decr fall risk.    Time 8    Period Weeks    Status New                   Plan - 06/25/21 0826     Clinical Impression Statement Progressed VOR x1 today to standing with a solid background, updated to pt's HEP. No dizziness noted, pt did need cues to slowly incr speed. Pt challenged by visual tracking/diagonal head motions going up to the R and down to the L. Imbalance noted and mild dizziness when performing these directions with clockwise/counterclockwise circles. Pt tolerated session well, will continue to progress towards LTGs.    Personal Factors and Comorbidities Comorbidity 3+;Past/Current Experience    Comorbidities Cervical dystonia, HTN, depression, anxiety.    Examination-Activity Limitations Stairs;Stand;Squat;Locomotion Level;Transfers;Bend    Examination-Participation Restrictions Community Activity;Driving;Yard Work;Occupation    Stability/Clinical Decision Making Evolving/Moderate complexity    Rehab Potential Good    PT Frequency 2x / week   1-2x a week - 9 remaining visits over 8 weeks   PT Duration 8 weeks   1-2x a week - 9 remaining visits over 8 weeks   PT Treatment/Interventions ADLs/Self Care Home Management;Stair training;Gait training;DME Instruction;Functional mobility training;Therapeutic activities;Therapeutic exercise;Neuromuscular re-education;Balance training;Patient/family education;Vestibular;Manual techniques;Passive range of motion    PT Next Visit Plan progress VOR as able. gait/balance with head movements - esp diagonals. continue to work on gait with cane. balance on compliant surfaces and incr vestibular input. SLS tasks/ how is sciatic pain? how does pt want to split up the rest of  his visits?    PT Home Exercise Plan Access Code [email or web address]    Consulted and Agree with Plan of Care Patient    Family Member Consulted pt's wife             Patient will benefit from skilled therapeutic intervention in order to improve the following deficits and impairments:  Abnormal gait, Decreased coordination, Decreased balance, Decreased safety awareness, Decreased strength, Difficulty walking, Dizziness, Decreased activity tolerance  Visit Diagnosis: Unsteadiness on feet  Dizziness and giddiness  Muscle weakness (generalized)  Other abnormalities of gait and mobility     Problem List Patient Active Problem List   Diagnosis Date Noted   Fall 02/05/2021   Fracture tibia/fibula, left, closed, initial encounter 02/05/2021   Cerebrovascular accident (CVA) of right thalamus ([place]) 12/31/2020   Cerebral infarction due to embolism of left cerebellar artery ([place]) 12/31/2020   Alcohol use 12/19/2020   Acute CVA (cerebrovascular accident) ([place]) 12/18/2020   Depression with anxiety 12/18/2020   Essential hypertension 12/18/2020   Isolated cervical dystonia 12/06/2016    Arliss Journey, PT, DPT  06/25/2021, 8:28 AM  [facility] [address] [place], [place], [address] Phone: [phone]   Fax:  [phone]  Name: Makar Slatter MRN: [id number] Date of Birth: 18-May-1956

## 2021-06-28 ENCOUNTER — Ambulatory Visit: Payer: BC Managed Care – PPO | Admitting: Physical Therapy

## 2021-06-28 ENCOUNTER — Encounter: Payer: Self-pay | Admitting: Physical Therapy

## 2021-06-28 ENCOUNTER — Other Ambulatory Visit: Payer: Self-pay

## 2021-06-28 DIAGNOSIS — G8929 Other chronic pain: Secondary | ICD-10-CM | POA: Diagnosis not present

## 2021-06-28 DIAGNOSIS — R2689 Other abnormalities of gait and mobility: Secondary | ICD-10-CM

## 2021-06-28 DIAGNOSIS — M5440 Lumbago with sciatica, unspecified side: Secondary | ICD-10-CM | POA: Diagnosis not present

## 2021-06-28 DIAGNOSIS — R2681 Unsteadiness on feet: Secondary | ICD-10-CM

## 2021-06-28 NOTE — Therapy (Signed)
[facility] [address] [place], [place], [address] Phone: [phone]   Fax:  [phone]  Physical Therapy Treatment  Patient Details  Name: Miguel [name] MRN: [id number] Date of Birth: [birth year] Referring Provider (PT): Dr. Marcelino Scot (now followed by Mena Goes)   Encounter Date: 06/28/2021   PT End of Session - 06/28/21 1914     Visit Number 31    Number of Visits 32    Date for PT Re-Evaluation 27/07/86   per recert on 7/54/49   Authorization Type BCBS (03/05/21-03/04/22)    Authorization - Visit Number 10    Authorization - Number of Visits 30    PT Start Time 2010    PT Stop Time 0933    PT Time Calculation (min) 46 min    Equipment Utilized During Treatment Gait belt    Activity Tolerance Patient tolerated treatment well    Behavior During Therapy [facility] for tasks assessed/performed             Past Medical History:  Diagnosis Date   Cervical dystonia    Depression    Erectile dysfunction due to arterial insufficiency    Functional dyspepsia    Hypertension    Pancreatitis 2005   likely secondary to alcohol use   Rosacea     Past Surgical History:  Procedure Laterality Date   no past surgery     TIBIA IM NAIL INSERTION Left 02/07/2021   Procedure: INTRAMEDULLARY (IM) NAIL TIBIAL;  Surgeon: Altamese [place], MD;  Location: [facility];  Service: Orthopedics;  Laterality: Left;    There were no vitals filed for this visit.   Subjective Assessment - 06/28/21 0856     Subjective Pt reports the gaze stabilization exercise continues to leave him with "hazy feeling"; Wife reports pt only had 1 "sit down" episode over the weekend at a restaurant  - opened a door and a lady standing there with a baby and she didn't move - he became slightly dizzy and unsteady and had to sit down    Patient is accompained by: Family member   spouse   Pertinent History [name]. history of cervical dystonia and receives Xeomin  injections in the past followed by neurology service Dr. Evelena Leyden at [facility], hypertension, depression, tobacco and alcohol use    How long can you walk comfortably? farthest walk in hospital was 67' with RW.    Diagnostic tests MRI showed acute infarct left inferior posterior cerebellum.  Small acute infarct right thalamus.    Patient Stated Goals build up his legs and arms, walking again    Currently in Pain? No/denies    Pain Onset More than a month ago                               [facility] Adult PT Treatment/Exercise - 06/28/21 0917       Transfers   Transfers Sit to Stand;Stand to Sit    Sit to Stand 5: Supervision;4: Min guard    Number of Reps Other reps (comment)   13 reps total   Comments pt performed 3 reps sit to stand feet on mat, no UE support with EO;  5 reps EC with feet on floor; 5 reps feet on Airex EO      Ambulation/Gait   Ambulation/Gait Yes    Ambulation/Gait Assistance 4: Min guard    Ambulation/Gait Assistance Details pt performed slow  horizontal head turns intermittently 40' x 1; slow vertical head turns 40' x 1 intermittently    Ambulation Distance (Feet) 115 Feet    Assistive device None    Gait Pattern Step-through pattern    Ambulation Surface Level;Indoor             Vestibular Treatment/Exercise - 06/28/21 0924       Vestibular Treatment/Exercise   Vestibular Treatment Provided Gaze    Gaze Exercises X1 Viewing Horizontal;X1 Viewing Vertical      X1 Viewing Horizontal   Foot Position bil. stance    Time --   30 secs   Reps 1      X1 Viewing Vertical   Foot Position bil. stance    Time --   30 secs   Reps 1            Pt performed forwards amb. Tossing and catching ball for improved gaze stabilization during dynamic gait - 40' x 2 reps  Pt stood with feet together - on floor - performed visual tracking exercise with purple ball (medium sized) clockwise 5 reps and Counterclockwise 5 reps  Pt stood  on 2 pillows in corner - performed trunk rotations Rt to Lt sides - using hands as targets 5 reps; diagonals "X" 5 reps each Diagonal with CGA to min assist for balance recovery          PT Short Term Goals - 06/23/21 2137       PT SHORT TERM GOAL #1   Title Pt will undergo further assessment of DGI with LTG written in order to demo decr fall risk. ALL STGS DUE 06/26/21    Baseline 06/15/21: 14/24 scored as baseline. PT to set LTG.    Time --    Period --    Status Achieved    Target Date --      PT SHORT TERM GOAL #2   Title Pt will ascend/descend 12 steps with single handrail with supervision with step to vs. step through pattern in order to demo improved safety with stairs at home.    Baseline 06/23/21: met in session today    Time --    Period --    Status Achieved      PT SHORT TERM GOAL #3   Title Pt will ambulate at least 230' over indoor level surfaces with cane with supervision in order to demo improved household mobility.    Baseline 06/23/21: met this session    Time --    Period --    Status Achieved               PT Long Term Goals - 06/28/21 1921       PT LONG TERM GOAL #1   Title Pt will improve gait speed with SPC to at least 2.5 ft/sec for improved gait efficiency. ALL LTGS DUE 07/22/21    Baseline 05/25/21: 2.30 ft/sec with cane    Time 8    Period Weeks    Status New      PT LONG TERM GOAL #2   Title Pt will ambulate at least 200' with SPC over outdoor unlevel surfaces with supervision in order to demo improved community mobility.    Baseline , min guard assist with cane    Time 8    Period Weeks    Status Revised      PT LONG TERM GOAL #3   Title Pt will perform TUG with SPC with supervision in 14 seconds  or less with SPC and supervision in order to demo decr fall risk.    Baseline 05/25/21: 14.06 sec's with cane with min guard    Time 8    Period Weeks    Status Revised      PT LONG TERM GOAL #4   Title DGI goal to be written as  appropriate in order to demo decr fall risk.    Time 8    Period Weeks    Status New                   Plan - 06/28/21 1915     Clinical Impression Statement Pt had unsteadiness with amb. with horizontal and vertical head turns (no device used during session), needing min to mod assist at times for balance recovery.   Pt reported very minimal dizziness with exercises, but had imbalance and postural instability with standing on compliant surfaces.  Pt needed mod assist for recovery of LOB with amb. and tossing ball.  Cont with POC.    Personal Factors and Comorbidities Comorbidity 3+;Past/Current Experience    Comorbidities Cervical dystonia, HTN, depression, anxiety.    Examination-Activity Limitations Stairs;Stand;Squat;Locomotion Level;Transfers;Bend    Examination-Participation Restrictions Community Activity;Driving;Yard Work;Occupation    Stability/Clinical Decision Making Evolving/Moderate complexity    Rehab Potential Good    PT Frequency 2x / week   1-2x a week - 9 remaining visits over 8 weeks   PT Duration 8 weeks   1-2x a week - 9 remaining visits over 8 weeks   PT Treatment/Interventions ADLs/Self Care Home Management;Stair training;Gait training;DME Instruction;Functional mobility training;Therapeutic activities;Therapeutic exercise;Neuromuscular re-education;Balance training;Patient/family education;Vestibular;Manual techniques;Passive range of motion    PT Next Visit Plan progress VOR as able. gait/balance with head movements - esp diagonals. continue to work on gait with cane. balance on compliant surfaces and incr vestibular input. SLS tasks/ how is sciatic pain? how does pt want to split up the rest of his visits?    PT Home Exercise Plan Access Code [email or web address]    Consulted and Agree with Plan of Care Patient    Family Member Consulted pt's wife             Patient will benefit from skilled therapeutic intervention in order to improve the following deficits and  impairments:  Abnormal gait, Decreased coordination, Decreased balance, Decreased safety awareness, Decreased strength, Difficulty walking, Dizziness, Decreased activity tolerance  Visit Diagnosis: Unsteadiness on feet  Other abnormalities of gait and mobility     Problem List Patient Active Problem List   Diagnosis Date Noted   Fall 02/05/2021   Fracture tibia/fibula, left, closed, initial encounter 02/05/2021   Cerebrovascular accident (CVA) of right thalamus ([place]) 12/31/2020   Cerebral infarction due to embolism of left cerebellar artery ([place]) 12/31/2020   Alcohol use 12/19/2020   Acute CVA (cerebrovascular accident) ([place]) 12/18/2020   Depression with anxiety 12/18/2020   Essential hypertension 12/18/2020   Isolated cervical dystonia 12/06/2016    [place], PT 06/28/2021, 7:31 PM  [facility] [address] [place], [place], [address] Phone: [phone]   Fax:  [phone]  Name: Miguel [name] MRN: [id number] Date of Birth: May 06, 1956

## 2021-07-01 ENCOUNTER — Ambulatory Visit: Payer: BC Managed Care – PPO | Admitting: Physical Therapy

## 2021-07-01 ENCOUNTER — Encounter: Payer: Self-pay | Admitting: Physical Therapy

## 2021-07-01 ENCOUNTER — Other Ambulatory Visit: Payer: Self-pay

## 2021-07-01 DIAGNOSIS — M6281 Muscle weakness (generalized): Secondary | ICD-10-CM

## 2021-07-01 DIAGNOSIS — R42 Dizziness and giddiness: Secondary | ICD-10-CM

## 2021-07-01 DIAGNOSIS — R2689 Other abnormalities of gait and mobility: Secondary | ICD-10-CM

## 2021-07-01 DIAGNOSIS — R29818 Other symptoms and signs involving the nervous system: Secondary | ICD-10-CM

## 2021-07-01 DIAGNOSIS — R2681 Unsteadiness on feet: Secondary | ICD-10-CM

## 2021-07-01 DIAGNOSIS — M5440 Lumbago with sciatica, unspecified side: Secondary | ICD-10-CM | POA: Diagnosis not present

## 2021-07-01 DIAGNOSIS — G8929 Other chronic pain: Secondary | ICD-10-CM | POA: Diagnosis not present

## 2021-07-02 NOTE — Therapy (Signed)
[facility] [address] [place], [place], [address] Phone: [phone]   Fax:  [phone]  Physical Therapy Treatment  Patient Details  Name: Miguel [name] MRN: [id number] Date of Birth: [birth year] Referring Provider (PT): Dr. Marcelino Scot (now followed by Mena Goes)   Encounter Date: 07/01/2021    07/01/21 0855  PT Visits / Re-Eval  Visit Number 32  Number of Visits 32  Date for PT Re-Evaluation 36/64/40 (per recert on 3/47/42)  Authorization  Authorization Type BCBS (03/05/21-03/04/22)  Authorization - Visit Number 28  Authorization - Number of Visits 30  PT Time Calculation  PT Start Time 0849  PT Stop Time 0930  PT Time Calculation (min) 41 min  PT - End of Session  Equipment Utilized During Treatment Gait belt  Activity Tolerance Patient tolerated treatment well  Behavior During Therapy [facility] for tasks assessed/performed    Past Medical History:  Diagnosis Date   Cervical dystonia    Depression    Erectile dysfunction due to arterial insufficiency    Functional dyspepsia    Hypertension    Pancreatitis 2005   likely secondary to alcohol use   Rosacea     Past Surgical History:  Procedure Laterality Date   no past surgery     TIBIA IM NAIL INSERTION Left 02/07/2021   Procedure: INTRAMEDULLARY (IM) NAIL TIBIAL;  Surgeon: Altamese [place], MD;  Location: [facility];  Service: Orthopedics;  Laterality: Left;    There were no vitals filed for this visit.    07/01/21 0852  Symptoms/Limitations  Subjective No new complaints. No falls since last session. Had one episode of dizziness with going down the step at the ramp, pt then turned in a circle twice before bracing himself on wall with spouse assistance, Never had to close his eyes or sit down.  Patient is accompained by: Family member (spouse)  Pertinent History [name]. history of cervical dystonia and receives Xeomin injections in the past followed by  neurology service Dr. Evelena Leyden at [facility], hypertension, depression, tobacco and alcohol use  How long can you walk comfortably? farthest walk in hospital was 57' with RW.  Diagnostic tests MRI showed acute infarct left inferior posterior cerebellum.  Small acute infarct right thalamus.  Patient Stated Goals build up his legs and arms, walking again  Pain Assessment  Currently in Pain? No/denies  Pain Score 0       07/01/21 0858  Transfers  Transfers Sit to Stand;Stand to Sit  Sit to Stand 5: Supervision;4: Min guard  Stand to Sit 5: Supervision;With upper extremity assist;To bed;To chair/3-in-1  Ambulation/Gait  Ambulation/Gait Yes  Ambulation/Gait Assistance 4: Min guard  Ambulation/Gait Assistance Details use of cane to enter/exit session. no device used in session.  Assistive device None  Gait Pattern Step-through pattern  Ambulation Surface Level;Indoor  Neuro Re-ed   Neuro Re-ed Details  for balance/muscle re-ed/coordination: gait around track working on scanning all directions randomly, speed changes and sudden stops/starts with min guard to min assist for balance, verring noted with mild ataxia at times; gait around track with sunshine ball- forward gait while tracking the ball in vertical direction, then in the lateral direction for 1 lap each min guard to min assist for balance. pt needed to stop during vertical, however not with lateral direction. then with purple ball self tossing ball for 1 lap. then had pt alternating tossing ball laterally toward student on right, volunteer on left<>catching ball from them for 1 lap.  min to mod assist for balance at times.        07/01/21 0859  X1 Viewing Horizontal  Foot Position bil. stance  Time  (30 seconds)  Reps 2  Comments trace of dizziness at end of reps that resolved quickly (< 5 seconds)  X1 Viewing Vertical  Foot Position bil. stance  Time  (30 seconds)  Reps 2  Comments trace dizziness toward end  of reps that resolved quickly (< 5 seconds)        PT Short Term Goals - 06/23/21 2137       PT SHORT TERM GOAL #1   Title Pt will undergo further assessment of DGI with LTG written in order to demo decr fall risk. ALL STGS DUE 06/26/21    Baseline 06/15/21: 14/24 scored as baseline. PT to set LTG.    Time --    Period --    Status Achieved    Target Date --      PT SHORT TERM GOAL #2   Title Pt will ascend/descend 12 steps with single handrail with supervision with step to vs. step through pattern in order to demo improved safety with stairs at home.    Baseline 06/23/21: met in session today    Time --    Period --    Status Achieved      PT SHORT TERM GOAL #3   Title Pt will ambulate at least 230' over indoor level surfaces with cane with supervision in order to demo improved household mobility.    Baseline 06/23/21: met this session    Time --    Period --    Status Achieved               PT Long Term Goals - 06/28/21 1921       PT LONG TERM GOAL #1   Title Pt will improve gait speed with SPC to at least 2.5 ft/sec for improved gait efficiency. ALL LTGS DUE 07/22/21    Baseline 05/25/21: 2.30 ft/sec with cane    Time 8    Period Weeks    Status New      PT LONG TERM GOAL #2   Title Pt will ambulate at least 200' with SPC over outdoor unlevel surfaces with supervision in order to demo improved community mobility.    Baseline , min guard assist with cane    Time 8    Period Weeks    Status Revised      PT LONG TERM GOAL #3   Title Pt will perform TUG with SPC with supervision in 14 seconds or less with SPC and supervision in order to demo decr fall risk.    Baseline 05/25/21: 14.06 sec's with cane with min guard    Time 8    Period Weeks    Status Revised      PT LONG TERM GOAL #4   Title DGI goal to be written as appropriate in order to demo decr fall risk.    Time 8    Period Weeks    Status New              07/01/21 0855  Plan  Clinical  Impression Statement Today's skilled session continued to focus on increased use of vestibular system and balance training with mild dizziness reported at times that resolved quickly. Rest breaks taken as needed. No other issues noted or reported in session. The pt is making steady progress toward goals and should benefit from  comtinued PT to progress toward unmet goals.  Personal Factors and Comorbidities Comorbidity 3+;Past/Current Experience  Comorbidities Cervical dystonia, HTN, depression, anxiety.  Examination-Activity Limitations Stairs;Stand;Squat;Locomotion Level;Transfers;Bend  Examination-Participation Restrictions Community Activity;Driving;Yard Work;Occupation  Pt will benefit from skilled therapeutic intervention in order to improve on the following deficits Abnormal gait;Decreased coordination;Decreased balance;Decreased safety awareness;Decreased strength;Difficulty walking;Dizziness;Decreased activity tolerance  Stability/Clinical Decision Making Evolving/Moderate complexity  Rehab Potential Good  PT Frequency 2x / week (1-2x a week - 9 remaining visits over 8 weeks)  PT Duration 8 weeks (1-2x a week - 9 remaining visits over 8 weeks)  PT Treatment/Interventions ADLs/Self Care Home Management;Stair training;Gait training;DME Instruction;Functional mobility training;Therapeutic activities;Therapeutic exercise;Neuromuscular re-education;Balance training;Patient/family education;Vestibular;Manual techniques;Passive range of motion  PT Next Visit Plan progress VOR as able. gait/balance with head movements - esp diagonals. continue to work on gait with cane. balance on compliant surfaces and incr vestibular input. SLS tasks/ how is sciatic pain? Pt has scheduled his last 2 remaining insurance visits for every other week after todays session. Give pt information on [facility] PT pro bono clinic at one of these visits.  PT Home Exercise Plan Access Code [email or web address]  Consulted and Agree with Plan of  Care Patient  Family Member Consulted pt's wife          Patient will benefit from skilled therapeutic intervention in order to improve the following deficits and impairments:  Abnormal gait, Decreased coordination, Decreased balance, Decreased safety awareness, Decreased strength, Difficulty walking, Dizziness, Decreased activity tolerance  Visit Diagnosis: Unsteadiness on feet  Other abnormalities of gait and mobility  Dizziness and giddiness  Muscle weakness (generalized)  Other symptoms and signs involving the nervous system     Problem List Patient Active Problem List   Diagnosis Date Noted   Fall 02/05/2021   Fracture tibia/fibula, left, closed, initial encounter 02/05/2021   Cerebrovascular accident (CVA) of right thalamus ([place]) 12/31/2020   Cerebral infarction due to embolism of left cerebellar artery ([place]) 12/31/2020   Alcohol use 12/19/2020   Acute CVA (cerebrovascular accident) ([place]) 12/18/2020   Depression with anxiety 12/18/2020   Essential hypertension 12/18/2020   Isolated cervical dystonia 12/06/2016   Willow Ora, PTA, [facility] [address] [place], [place] [address] [phone] 07/02/21, 7:22 PM   Name: Miguel [name] MRN: [id number] Date of Birth: [birth year]

## 2021-07-05 ENCOUNTER — Ambulatory Visit: Payer: BC Managed Care – PPO | Admitting: Physical Therapy

## 2021-07-08 ENCOUNTER — Ambulatory Visit: Payer: BC Managed Care – PPO | Admitting: Physical Therapy

## 2021-07-11 DIAGNOSIS — S82202A Unspecified fracture of shaft of left tibia, initial encounter for closed fracture: Secondary | ICD-10-CM | POA: Diagnosis not present

## 2021-07-13 ENCOUNTER — Ambulatory Visit: Payer: BC Managed Care – PPO | Admitting: Physical Therapy

## 2021-07-15 ENCOUNTER — Ambulatory Visit: Payer: BC Managed Care – PPO | Admitting: Physical Therapy

## 2021-07-19 ENCOUNTER — Ambulatory Visit: Payer: BC Managed Care – PPO | Admitting: Physical Therapy

## 2021-07-20 ENCOUNTER — Other Ambulatory Visit (HOSPITAL_COMMUNITY): Payer: Self-pay

## 2021-07-21 ENCOUNTER — Ambulatory Visit: Payer: BC Managed Care – PPO | Admitting: Physical Therapy

## 2021-07-21 ENCOUNTER — Other Ambulatory Visit (HOSPITAL_COMMUNITY): Payer: Self-pay

## 2021-07-22 ENCOUNTER — Ambulatory Visit: Payer: BC Managed Care – PPO | Admitting: Physical Therapy

## 2021-07-22 ENCOUNTER — Other Ambulatory Visit (HOSPITAL_COMMUNITY): Payer: Self-pay

## 2021-07-23 ENCOUNTER — Ambulatory Visit: Payer: BC Managed Care – PPO | Attending: Physician Assistant | Admitting: Physical Therapy

## 2021-07-23 ENCOUNTER — Other Ambulatory Visit: Payer: Self-pay

## 2021-07-23 DIAGNOSIS — R42 Dizziness and giddiness: Secondary | ICD-10-CM | POA: Insufficient documentation

## 2021-07-23 DIAGNOSIS — R29818 Other symptoms and signs involving the nervous system: Secondary | ICD-10-CM | POA: Insufficient documentation

## 2021-07-23 DIAGNOSIS — R2689 Other abnormalities of gait and mobility: Secondary | ICD-10-CM | POA: Insufficient documentation

## 2021-07-23 DIAGNOSIS — R2681 Unsteadiness on feet: Secondary | ICD-10-CM | POA: Insufficient documentation

## 2021-07-23 DIAGNOSIS — M6281 Muscle weakness (generalized): Secondary | ICD-10-CM | POA: Insufficient documentation

## 2021-07-23 NOTE — Patient Instructions (Signed)
Access Code: [email or web address] URL: [email or web address] Date: 07/23/2021 Prepared by: Janann August  Exercises Side Stepping with Resistance at Ankles and Counter Support - 1 x daily - 5 x weekly - 1 sets - 3 reps Tandem Walking with Counter Support - 1 x daily - 5 x weekly - 1 sets - 3 reps Standing Single Leg Stance with Counter Support - 1 x daily - 5 x weekly - 1 sets - 3 reps - 15 second hold Standing Near Stance in Corner with Eyes Closed - 1 x daily - 5 x weekly - 1 sets - 3 reps - 30 hold Standing Balance in Corner with Eyes Closed - 1 x daily - 5 x weekly - 1 sets - 10 reps Seated Gaze Stabilization with Head Rotation - 2 x daily - 5 x weekly - 1 sets - 2-3 reps - 60 seconds hold Seated Gaze Stabilization with Head Rotation and Horizontal Arm Movement - 1 x daily - 5 x weekly - 1 sets - 2-3 reps - 60 seconds hold Seated Hamstring Stretch - 1 x daily - 7 x weekly - 3 sets - 10 reps Supine Piriformis Stretch with Foot on Ground - 1 x daily - 7 x weekly - 3 sets - 10 reps

## 2021-07-23 NOTE — Therapy (Addendum)
[facility] [address] [place], [place], [address] Phone: [phone]   Fax:  [phone]  Physical Therapy Treatment/Re-Cert  Patient Details  Name: Miguel [name] MRN: [id number] Date of Birth: [birth year] Referring Provider (PT): Dr. Marcelino Scot (now followed by Mena Goes)   Encounter Date: 07/23/2021   PT End of Session - 07/23/21 0719     Visit Number 33    Number of Visits 34    Date for PT Re-Evaluation 54/65/03   per re-cert on 54/65/68   Authorization Type [employer] (03/05/21-03/04/22)    Authorization - Visit Number 63    Authorization - Number of Visits 30    PT Start Time 0717    PT Stop Time 0800    PT Time Calculation (min) 43 min    Equipment Utilized During Treatment Gait belt    Activity Tolerance Patient tolerated treatment well    Behavior During Therapy WFL for tasks assessed/performed             Past Medical History:  Diagnosis Date   Cervical dystonia    Depression    Erectile dysfunction due to arterial insufficiency    Functional dyspepsia    Hypertension    Pancreatitis 2005   likely secondary to alcohol use   Rosacea     Past Surgical History:  Procedure Laterality Date   no past surgery     TIBIA IM NAIL INSERTION Left 02/07/2021   Procedure: INTRAMEDULLARY (IM) NAIL TIBIAL;  Surgeon: Altamese , MD;  Location: [facility];  Service: Orthopedics;  Laterality: Left;    There were no vitals filed for this visit.   Subjective Assessment - 07/23/21 0720     Subjective Feels like he is slowly getting better. Has had 2 falls since he was last here. One was when he went to sit in his w/c unlocked. A second one where he went to go sit down in a chair and his dog got in front of him and fell on his hands and knees. Had a sit down episode when he got dizzy. Sciatic nerve is still driving him crazy. Still having sciatic pain going down R leg. Has been doing the stretches and reports that they  feel worse afterwards.    Patient is accompained by: Family member   spouse   Pertinent History [name]. history of cervical dystonia and receives Xeomin injections in the past followed by neurology service Dr. Evelena Leyden at [facility], hypertension, depression, tobacco and alcohol use    How long can you walk comfortably? farthest walk in hospital was 57' with RW.    Diagnostic tests MRI showed acute infarct left inferior posterior cerebellum.  Small acute infarct right thalamus.    Patient Stated Goals build up his legs and arms, walking again    Currently in Pain? No/denies                [facility] PT Assessment - 07/23/21 0801       Assessment   Medical Diagnosis CVA/ s/p L tib/fib fx    Referring Provider (PT) Dr. Marcelino Scot (now followed by Mena Goes)    Onset Date/Surgical Date 02/05/21    Hand Dominance Right      Prior Function   Level of Independence Independent                  07/23/21 0732  Dynamic Gait Index  Level Surface 2 (with cane, with no AD)  Change in  Gait Speed 2 (with cane and with no AD)  Gait with Horizontal Head Turns 2 (1 with no AD)  Gait with Vertical Head Turns 2  Gait and Pivot Turn 2  Step Over Obstacle 2 (1 with no AD)  Step Around Obstacles 3  Steps 2  Total Score 17  DGI comment: 17/24         07/23/21 0800  Ambulation/Gait  Ambulation/Gait Yes  Ambulation/Gait Assistance 5: Supervision  Ambulation/Gait Assistance Details Discussed use of cane/RW at all times in the home for safety with balance and due to dizziness episodes that will come on randomly during standing/gait. Pt has been trying to do walking at home with no AD and takes shorter/shuffled steps. Discussed that based on pt's continued fall risk he needs to be using an AD at all times.  Assistive device Straight cane  Gait Pattern Step-through pattern  Ambulation Surface Level;Indoor  Gait velocity 15.03 seconds = 2.18 ft/sec (with cane.)   Therapeutic Activites   Therapeutic Activities Other Therapeutic Activities  Other Therapeutic Activities Pt's spouse reported that she may have to go back into the office for work. Discussed safety concerns regarding this with pt being on his own alone at home due to fall risk/pt having a history of falls and due to dizziness episodes that come out of nowhere (likely central in nature due to location of pt's cerebellar) where pt will have to suddenly grab onto an object or need to slowly sit down. Due to dizziness/fatigue, pt's spouse also provides supervision when going up and down the stairs at home for safety as pt is not safe to do this on his own. Pt also reports that he has been doing some ambulation around the house with no AD with small distances and has been carrying objects in both hands. Discussed that with fall risk, that this would not be safe with dual tasking and that he should be using an AD at all times, such as his SPC or RW.       07/27/21 0001  X1 Viewing Horizontal  Foot Position standing with feet apart  Time  (30 secs)  Reps 2  Comments cues for technique. Verbally added to pt's HEP at home in standing, standing with chair in front of him and behind him and making sure letter is on a solid background. mild dizziness.  X1 Viewing Vertical  Foot Position standing with feet apart  Time  (30 secs)  Reps 2  Comments cues for technique. Verbally added to pt's HEP at home in standing, standing with chair in front of him and behind him and making sure letter is on a solid background. mild dizziness.     Access Code: [email or web address] URL: [email or web address] Date: 07/23/2021 Prepared by: Janann August  See [place] for other exercises in HEP not reviewed at session due to time constraints.   Reviewed exercises below:  Seated Gaze Stabilization with Head Rotation - 2 x daily - 5 x weekly - 1 sets - 2-3 reps - 60 seconds hold - verbally reviewed in sitting (pt  has primarily been performing in sitting at home). Added for pt to perform standing at home for 30 seconds in horizontal/vertical directions with UE support.  Seated Hamstring Stretch - 1 x daily - 7 x weekly - 3 sets - 10 reps - took away the strap due to pt not performing with proper technique, instead performed seated at edge of mat with cues for pt to sit up nice and tall  for an incr stretch, pt reporting feeling good with this one.  Supine Piriformis Stretch with Foot on Ground - 1 x daily - 7 x weekly - 3 sets - 10 reps - with RLE, supine on mat table, cues for gentle over pressure with RLE for an incr stretch.     Pt reported continued sciatic pain down RLE, reviewed stretches with pt only holding stretches for 5 seconds and not performing correctly. Discussed purpose and importance of holding for at least 30 seconds to allow muscles to relax. Pt reporting that RLE felt better after holding stretches in session today.          PT Short Term Goals - 06/23/21 2137       PT SHORT TERM GOAL #1   Title Pt will undergo further assessment of DGI with LTG written in order to demo decr fall risk. ALL STGS DUE 06/26/21    Baseline 06/15/21: 14/24 scored as baseline. PT to set LTG.    Time --    Period --    Status Achieved    Target Date --      PT SHORT TERM GOAL #2   Title Pt will ascend/descend 12 steps with single handrail with supervision with step to vs. step through pattern in order to demo improved safety with stairs at home.    Baseline 06/23/21: met in session today    Time --    Period --    Status Achieved      PT SHORT TERM GOAL #3   Title Pt will ambulate at least 230' over indoor level surfaces with cane with supervision in order to demo improved household mobility.    Baseline 06/23/21: met this session    Time --    Period --    Status Achieved               PT Long Term Goals - 07/27/21 1048       PT LONG TERM GOAL #1   Title Pt will improve gait speed  with SPC to at least 2.5 ft/sec for improved gait efficiency. ALL LTGS DUE 07/22/21    Baseline 07/23/21: 2.18 ft/sec    Time 8    Period Weeks    Status Not Met      PT LONG TERM GOAL #2   Title Pt will ambulate at least 200' with SPC over outdoor unlevel surfaces with supervision in order to demo improved community mobility.    Baseline did not get to perform due to weather on 07/23/21    Time 8    Period Weeks    Status Revised      PT LONG TERM GOAL #3   Title Pt will perform TUG with SPC with supervision in 14 seconds or less with SPC and supervision in order to demo decr fall risk.    Baseline 05/25/21: 14.06 sec's with cane with min guard, did not have time to assess on 07/23/21    Time 8    Period Weeks    Status On-going      PT LONG TERM GOAL #4   Title Pt will improve DGI to at least 18/24 in order to demo decr fall risk.    Baseline 17/24 with use of SPC , previously 14/24    Time 8    Period Weeks    Status Not Met              LTG for re-cert:   PT Long  Term Goals - 08/03/21 0801       PT LONG TERM GOAL #1   Title Pt will be independent with final HEP for streching, balance, strengthening in order to build upon gains made in therapy. ALL LTGS DUE 08/22/21    Time 4    Period Weeks    Status New                 08/03/21 0750  Plan  Clinical Impression Statement Began checking LTGs today. Performed the DGI with pt scoring a 17/24 with his SPC, indicating an incr risk for falls. Improvement from last assessed at 14/24. Performed head turns and stepping over an obstacle with no AD with pt needing more assist due to unsteadiness. Performed gait speed with cane with pt ambulating at 2.18 ft/sec, indicating a limited community ambulator. Due to time constraints did not have time to assess remainder of LTGs. Pt continues to have random, dizziness episodes intermittently that will require him to suddenly need to sit down.  Remainder of session focused on  beginning to review and progress pt's HEP for VOR exercises for gaze stabilization and RLE stretches to help with pt's sciatic pain that will affect his gait/balance. Pt with visit limitiations with his insurance and only has one more visit left until he gets Medicare. Will re-cert for one more visit to finalize pt's HEP.  Personal Factors and Comorbidities Comorbidity 3+;Past/Current Experience  Comorbidities Cervical dystonia, HTN, depression, anxiety.  Examination-Activity Limitations Stairs;Stand;Squat;Locomotion Level;Transfers;Bend  Examination-Participation Restrictions Community Activity;Driving;Yard Work;Occupation  Pt will benefit from skilled therapeutic intervention in order to improve on the following deficits Abnormal gait;Decreased coordination;Decreased balance;Decreased safety awareness;Decreased strength;Difficulty walking;Dizziness;Decreased activity tolerance  Stability/Clinical Decision Making Evolving/Moderate complexity  Rehab Potential Good  PT Frequency 2x / week (1-2x a week - 9 remaining visits over 8 weeks)  PT Duration 8 weeks (1-2x a week - 9 remaining visits over 8 weeks)  PT Treatment/Interventions ADLs/Self Care Home Management;Stair training;Gait training;DME Instruction;Functional mobility training;Therapeutic activities;Therapeutic exercise;Neuromuscular re-education;Balance training;Patient/family education;Vestibular;Manual techniques;Passive range of motion  PT Next Visit Plan finalize HEP. D/C, pt will need a new referral to return in the future after Medicare.  PT Home Exercise Plan Access Code [place] and Agree with Plan of Care Patient  Family Member Consulted pt's wife        Patient will benefit from skilled therapeutic intervention in order to improve the following deficits and impairments:     Visit Diagnosis: Unsteadiness on feet  Other abnormalities of gait and mobility  Dizziness and giddiness  Muscle weakness  (generalized)  Other symptoms and signs involving the nervous system     Problem List Patient Active Problem List   Diagnosis Date Noted   Fall 02/05/2021   Fracture tibia/fibula, left, closed, initial encounter 02/05/2021   Cerebrovascular accident (CVA) of right thalamus ([facility]) 12/31/2020   Cerebral infarction due to embolism of left cerebellar artery ([place]) 12/31/2020   Alcohol use 12/19/2020   Acute CVA (cerebrovascular accident) ([place]) 12/18/2020   Depression with anxiety 12/18/2020   Essential hypertension 12/18/2020   Isolated cervical dystonia 12/06/2016    Arliss Journey, PT, DPT  07/23/2021, 8:05 AM  [facility] [address] [place], [place], [address] Phone: [phone]   Fax:  [phone]  Name: Brien Lowe MRN: [id number] Date of Birth: February 12, 1956

## 2021-08-02 DIAGNOSIS — G243 Spasmodic torticollis: Secondary | ICD-10-CM | POA: Diagnosis not present

## 2021-08-03 ENCOUNTER — Encounter: Payer: Self-pay | Admitting: Physical Therapy

## 2021-08-03 ENCOUNTER — Encounter: Payer: BC Managed Care – PPO | Admitting: Physical Medicine & Rehabilitation

## 2021-08-03 ENCOUNTER — Encounter: Payer: Self-pay | Admitting: Physical Medicine & Rehabilitation

## 2021-08-03 ENCOUNTER — Ambulatory Visit: Payer: BC Managed Care – PPO | Admitting: Physical Therapy

## 2021-08-03 ENCOUNTER — Other Ambulatory Visit: Payer: Self-pay

## 2021-08-03 ENCOUNTER — Other Ambulatory Visit (HOSPITAL_COMMUNITY): Payer: Self-pay

## 2021-08-03 VITALS — BP 150/96 | HR 73 | Temp 97.9°F | Ht 72.0 in | Wt 209.6 lb

## 2021-08-03 DIAGNOSIS — G8929 Other chronic pain: Secondary | ICD-10-CM

## 2021-08-03 DIAGNOSIS — R269 Unspecified abnormalities of gait and mobility: Secondary | ICD-10-CM

## 2021-08-03 DIAGNOSIS — M5440 Lumbago with sciatica, unspecified side: Secondary | ICD-10-CM

## 2021-08-03 DIAGNOSIS — R2681 Unsteadiness on feet: Secondary | ICD-10-CM | POA: Diagnosis not present

## 2021-08-03 DIAGNOSIS — I69398 Other sequelae of cerebral infarction: Secondary | ICD-10-CM | POA: Insufficient documentation

## 2021-08-03 DIAGNOSIS — R42 Dizziness and giddiness: Secondary | ICD-10-CM

## 2021-08-03 DIAGNOSIS — R2689 Other abnormalities of gait and mobility: Secondary | ICD-10-CM

## 2021-08-03 DIAGNOSIS — M6281 Muscle weakness (generalized): Secondary | ICD-10-CM

## 2021-08-03 MED ORDER — GABAPENTIN 600 MG PO TABS
300.0000 mg | ORAL_TABLET | Freq: Three times a day (TID) | ORAL | 1 refills | Status: AC
  Filled 2021-08-03: qty 135, 90d supply, fill #0

## 2021-08-03 NOTE — Progress Notes (Signed)
Subjective:    Patient ID: Miguel [name], male    DOB: [birth year], 65 y.o.   MRN: [id number] 65 y.o. right-handed male with history of cervical dystonia and receives Xeomin injections in the past followed by neurology service Dr. Evelena Leyden at [facility], hypertension, depression, tobacco and alcohol use.  Per chart review lives with spouse independent prior to admission.  Two-level home half bath on main level.  Patient recently lost job 3 weeks prior.  Presented 12/18/2020 with persistent dizziness x1 week as well as left-sided ataxia.  Patient had reported on 12/16/2020 while riding his lawnmower and struck a tree without loss of consciousness.  Cranial CT scan showed no acute traumatic injury identified.  CT cervical spine no acute traumatic injury.  Patient did not receive tPA.  MRI showed acute infarct left inferior posterior cerebellum.  Small acute infarct right thalamus.  CT angiogram head and neck showed a moderate to severe stenosis origin of the left vertebral artery.  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  Atherosclerosis disease in the carotid bifurcation bilaterally 50% diameter stenosis proximal right internal carotid artery.  No significant left carotid stenosis.  Echocardiogram with ejection fraction 45 to 50% mild global hypokinesis, mild MR without AS.  Admission chemistries unremarkable except sodium 134 glucose 165 urinalysis negative nitrite urine drug screen positive marijuana.  Maintained on Plavix for CVA prophylaxis as well as Pletal 100 mg twice daily x3 weeks then Plavix alone.    Admit date: 12/31/2020 Discharge date: 01/14/2021 HPI  No falls , still has dizzy spells, occasional left-sided temple pain.  He continues to have right lower extremity pain radiating down from his back.  He has no progressive weakness no bowel or bladder dysfunction or persistent numbness.  We discussed lumbar x-rays as below, mild degenerative changes.  We discussed that the x-ray would  not rule out sciatica caused by disc herniation.  The patient would like to hold off on MRI due to financial considerations  CLINICAL DATA:  Chronic low back pain with sciatica. Patient reports pain radiating down bilateral legs. No known injury.   EXAM: LUMBAR SPINE - 2-3 VIEW   COMPARISON:  None.   FINDINGS: Five lumbar type vertebra. Normal alignment. Diffuse endplate spurring, mild disc space narrowing at L4-L5. Mild L5-S1 facet hypertrophy. Normal vertebral body heights. No evidence of fracture or focal bone abnormality. The sacroiliac joints are congruent.   IMPRESSION: Multilevel spondylosis as well as mild degenerative disc disease at L4-L5. Mild L5-S1 facet hypertrophy.     Electronically Signed   By: Keith Rake M.D.   On: 06/20/2021 11:58 Pain Inventory Average Pain 6 Pain Right Now 3 My pain is burning  LOCATION OF PAIN  r leg and knee  BOWEL Number of stools per week: regular   BLADDER Normal   Mobility walk with assistance use a cane use a walker how many minutes can you walk? 5-10 ability to climb steps?  yes do you drive?  yes use a wheelchair transfers alone  Function disabled: date disabled . I need assistance with the following:  meal prep, household duties, and shopping  Neuro/Psych weakness numbness trouble walking dizziness depression  Prior Studies Any changes since last visit?  no  Physicians involved in your care Any changes since last visit?  no   Family History  Problem Relation Age of Onset   Uterine cancer Mother    Emphysema Father    Social History   Socioeconomic History   Marital status:  Married    Spouse name: Not on file   Number of children: 0   Years of education: some college   Highest education level: Not on file  Occupational History   Occupation: Associate Professor  Tobacco Use   Smoking status: Former   Smokeless tobacco: Never  Scientific laboratory technician Use: Never used  Substance and  Sexual Activity   Alcohol use: Yes    Comment: 3 beers per day   Drug use: No   Sexual activity: Not on file  Other Topics Concern   Not on file  Social History Narrative   Lives at home with his wife.   3 cups of caffeine day.   Right-handed.   Social Determinants of Health   Financial Resource Strain: Not on file  Food Insecurity: Not on file  Transportation Needs: Not on file  Physical Activity: Not on file  Stress: Not on file  Social Connections: Not on file   Past Surgical History:  Procedure Laterality Date   no past surgery     TIBIA IM NAIL INSERTION Left 02/07/2021   Procedure: INTRAMEDULLARY (IM) NAIL TIBIAL;  Surgeon: Altamese Overton, MD;  Location: [facility];  Service: Orthopedics;  Laterality: Left;   Past Medical History:  Diagnosis Date   Cervical dystonia    Depression    Erectile dysfunction due to arterial insufficiency    Functional dyspepsia    Hypertension    Pancreatitis 2005   likely secondary to alcohol use   Rosacea    BP (!) 150/96   Pulse 73   Temp 97.9 F (36.6 C)   Ht 6' (1.829 m)   Wt 209 lb 9.6 oz (95.1 kg)   SpO2 97%   BMI 28.43 kg/m   Opioid Risk Score:   Fall Risk Score:  `1  Depression screen PHQ 2/9  Depression screen [facility] 2/9 08/03/2021 06/15/2021 03/05/2021 01/27/2021  Decreased Interest 3 1 3 3   Down, Depressed, Hopeless 2 1 2 2   PHQ - 2 Score 5 2 5 5   Altered sleeping - - - 0  Tired, decreased energy - - - 1  Change in appetite - - - 0  Feeling bad or failure about yourself  - - - 1  Trouble concentrating - - - 1  Moving slowly or fidgety/restless - - - 0  Suicidal thoughts - - - 0  PHQ-9 Score - - - 8  Difficult doing work/chores - - - Very difficult     Review of Systems  Constitutional: Negative.   HENT: Negative.    Eyes: Negative.   Respiratory: Negative.    Cardiovascular: Negative.   Gastrointestinal: Negative.   Endocrine: Negative.   Genitourinary: Negative.   Musculoskeletal:  Positive for gait  problem.  Skin: Negative.   Allergic/Immunologic: Negative.   Neurological:  Positive for dizziness, weakness and numbness.  Hematological: Negative.   Psychiatric/Behavioral:  Positive for dysphoric mood.   All other systems reviewed and are negative.     Objective:   Physical Exam Vitals and nursing note reviewed.  Constitutional:      Appearance: He is normal weight.  Eyes:     General: No visual field deficit.    Extraocular Movements: Extraocular movements intact.     Conjunctiva/sclera: Conjunctivae normal.     Pupils: Pupils are equal, round, and reactive to light.  Musculoskeletal:     Comments: No pain to palpation the lumbar paraspinal area. Positive straight leg raise on the right side Pain  of the lateral aspect of the knee joint.  There is mild varus deformity  Skin:    General: Skin is warm and dry.  Neurological:     Mental Status: He is alert and oriented to person, place, and time.     Cranial Nerves: No dysarthria or facial asymmetry.     Sensory: Sensation is intact.     Coordination: Romberg sign positive.     Gait: Gait abnormal.     Comments: Ambulates with a cane no evidence of toe drag or knee instability.  Psychiatric:        Mood and Affect: Mood normal.        Behavior: Behavior normal.          Assessment & Plan:  1.  Cerebellar infarct on the left due to left vertebral artery occlusion. He had a small right thalamic infarct which could potentially be causing some dysesthetic pain left forehead. 2.  Sciatic discomfort change pregabalin to gabapentin 300 mg 3 times daily if sedated changed to twice daily We discussed MRI however he does not wish to pursue this due to financial considerations.  He has no red flags.  I will see him back in 2 months

## 2021-08-03 NOTE — Therapy (Addendum)
[facility] [address] [place], [place], [address] Phone: [phone]   Fax:  [phone]  Physical Therapy Treatment/Discharge Summary  Patient Details  Name: Miguel [name] MRN: [id number] Date of Birth: May 05, 1956 Referring Provider (PT): Dr. Marcelino Scot (now followed by Mena Goes)   Encounter Date: 08/03/2021   PT End of Session - 08/03/21 1411     Visit Number 34    Number of Visits 34    Date for PT Re-Evaluation 95/07/22   per re-cert on 57/50/51   Authorization Type [employer] (03/05/21-03/04/22)    Authorization - Visit Number 30    Authorization - Number of Visits 30    PT Start Time 8335    PT Stop Time 8251    PT Time Calculation (min) 41 min    Equipment Utilized During Treatment Gait belt    Activity Tolerance Patient tolerated treatment well    Behavior During Therapy WFL for tasks assessed/performed             Past Medical History:  Diagnosis Date   Cervical dystonia    Depression    Erectile dysfunction due to arterial insufficiency    Functional dyspepsia    Hypertension    Pancreatitis 2005   likely secondary to alcohol use   Rosacea     Past Surgical History:  Procedure Laterality Date   no past surgery     TIBIA IM NAIL INSERTION Left 02/07/2021   Procedure: INTRAMEDULLARY (IM) NAIL TIBIAL;  Surgeon: Altamese Westphalia, MD;  Location: [facility];  Service: Orthopedics;  Laterality: Left;    There were no vitals filed for this visit.   Subjective Assessment - 08/03/21 1406     Subjective No new complaints. Couple of sit downs, however no falls. Now using a right knee brace he got over the weekend, does feel it offers some stability. Continues with sciatic pain that increased as day progressess. Using cane during day, and wheelchair toward end of afternoon/night due to pain and LE fatigue.    Patient is accompained by: Family member   spouse   Pertinent History [name]. history of cervical  dystonia and receives Xeomin injections in the past followed by neurology service Dr. Evelena Leyden at [facility], hypertension, depression, tobacco and alcohol use    How long can you walk comfortably? farthest walk in hospital was 14' with RW.    Diagnostic tests MRI showed acute infarct left inferior posterior cerebellum.  Small acute infarct right thalamus.    Patient Stated Goals build up his legs and arms, walking again    Currently in Pain? Yes    Pain Score 3     Pain Location Leg    Pain Orientation Right    Pain Descriptors / Indicators Aching;Shooting;Sore    Pain Type Chronic pain;Neuropathic pain    Pain Radiating Towards sciatica pain dwon right LE    Pain Onset More than a month ago    Pain Frequency Constant    Aggravating Factors  increased activity    Pain Relieving Factors rest, tylenol, stretching                     [facility] Adult PT Treatment/Exercise - 08/03/21 1511       Transfers   Transfers Sit to Stand;Stand to Sit    Sit to Stand 5: Supervision;4: Min guard    Stand to Sit 5: Supervision;With upper extremity assist;To bed;To chair/3-in-1  Ambulation/Gait   Ambulation/Gait Yes    Ambulation/Gait Assistance 5: Supervision    Ambulation Distance (Feet) --   around clinic with session   Assistive device Straight cane    Gait Pattern Step-through pattern    Ambulation Surface Level;Indoor      Exercises   Exercises Other Exercises    Other Exercises  reviewed HEP with changes made as needed. No issues noted or reported in session. Refer to [facility] for full details. Min guard assist for safety.            Issued to HEP this session:  Access Code: [email or web address] URL: [email or web address] Date: 08/03/2021 Prepared by: Willow Ora  Exercises Supine Figure 4 Piriformis Stretch (Mirrored) - 1 x daily - 5 x weekly - 1 sets - 3 reps - 30 seconds hold Seated Hamstring Stretch - 1 x daily - 7 x weekly - 3 sets - 10  reps Seated Gaze Stabilization with Head Rotation and Horizontal Arm Movement - 1 x daily - 5 x weekly - 1 sets - 2-3 reps - 60 seconds hold Side Stepping with Resistance at Ankles and Counter Support - 1 x daily - 5 x weekly - 1 sets - 3 reps Tandem Walking with Counter Support - 1 x daily - 5 x weekly - 1 sets - 3 reps Standing Near Stance in Corner with Eyes Closed - 1 x daily - 5 x weekly - 1 sets - 3 reps - 30 hold Standing Balance in Corner with Eyes Closed - 1 x daily - 5 x weekly - 1 sets - 10 reps Standing Gaze Stabilization with Head Rotation - 1 x daily - 5 x weekly - 1 sets - 10 reps Standing Gaze Stabilization with Head Nod - 1 x daily - 5 x weekly - 1 sets - 10 reps        PT Education - 08/03/21 1440     Education Details updated/revised HEP; plan for discharge today per PT plan of care    Person(s) Educated Patient;Spouse    Methods Explanation;Demonstration;Verbal cues;Handout    Comprehension Verbalized understanding;Returned demonstration;Verbal cues required              PT Short Term Goals - 06/23/21 2137       PT SHORT TERM GOAL #1   Title Pt will undergo further assessment of DGI with LTG written in order to demo decr fall risk. ALL STGS DUE 06/26/21    Baseline 06/15/21: 14/24 scored as baseline. PT to set LTG.    Time --    Period --    Status Achieved    Target Date --      PT SHORT TERM GOAL #2   Title Pt will ascend/descend 12 steps with single handrail with supervision with step to vs. step through pattern in order to demo improved safety with stairs at home.    Baseline 06/23/21: met in session today    Time --    Period --    Status Achieved      PT SHORT TERM GOAL #3   Title Pt will ambulate at least 230' over indoor level surfaces with cane with supervision in order to demo improved household mobility.    Baseline 06/23/21: met this session    Time --    Period --    Status Achieved               PT Long Term Goals - 08/03/21  0626  PT LONG TERM GOAL #1   Title Pt will be independent with final HEP for streching, balance, strengthening in order to build upon gains made in therapy. ALL LTGS DUE 08/22/21    Time 4    Period Weeks    Status New            PHYSICAL THERAPY DISCHARGE SUMMARY  Visits from Start of Care: 34  Current functional level related to goals / functional outcomes: See clinical assessment section.   Remaining deficits: Impaired balance, dizziness, gait abnormalities/ataxia, R sciatic pain, decr strength.    Education / Equipment: HEP   Patient agrees to discharge. Patient goals were partially met. Patient is being discharged due to maximized rehab potential.  And pt currently has no more PT visits for the remainder of the year with his insurance. Will be getting Medicare later in December.         Plan - 08/03/21 1412     Clinical Impression Statement Today's skilled session focused on remaining LTG for discharge. Pt's HEP reviewed with changes made as indicated. No issues reported or noted in session with performance of HEP. Pt and spouse in agreement to discharge today. Per primary PT; Performed the DGI on 08/03/21 with pt scoring a 17/24 with his SPC, indicating an incr risk for falls. Improvement from last assessed at 14/24. Performed head turns and stepping over an obstacle with no AD with pt needing more assist due to unsteadiness. Performed gait speed with cane with pt ambulating at 2.18 ft/sec, indicating a limited community ambulator. Pt continues to have random, dizziness episodes intermittently that will require him to suddenly need to sit down, likely central in nature due to location of pt's CVA. Pt to continue to use [facility] with supervision from pt's spouse or RW/transport chair later on in the day when more fatigued.    Personal Factors and Comorbidities Comorbidity 3+;Past/Current Experience    Comorbidities Cervical dystonia, HTN, depression, anxiety.     Examination-Activity Limitations Stairs;Stand;Squat;Locomotion Level;Transfers;Bend    Examination-Participation Restrictions Community Activity;Driving;Yard Work;Occupation    Stability/Clinical Decision Making Evolving/Moderate complexity    Rehab Potential Good    PT Frequency 2x / week   1-2x a week - 9 remaining visits over 8 weeks   PT Duration 8 weeks   1-2x a week - 9 remaining visits over 8 weeks   PT Treatment/Interventions ADLs/Self Care Home Management;Stair training;Gait training;DME Instruction;Functional mobility training;Therapeutic activities;Therapeutic exercise;Neuromuscular re-education;Balance training;Patient/family education;Vestibular;Manual techniques;Passive range of motion    PT Next Visit Plan discharge per PT plan of care    PT Home Exercise Plan Access Code [place] and Agree with Plan of Care Patient    Family Member Consulted pt's wife             Patient will benefit from skilled therapeutic intervention in order to improve the following deficits and impairments:  Abnormal gait, Decreased coordination, Decreased balance, Decreased safety awareness, Decreased strength, Difficulty walking, Dizziness, Decreased activity tolerance  Visit Diagnosis: Unsteadiness on feet  Other abnormalities of gait and mobility  Dizziness and giddiness  Muscle weakness (generalized)     Problem List Patient Active Problem List   Diagnosis Date Noted   Fall 02/05/2021   Fracture tibia/fibula, left, closed, initial encounter 02/05/2021   Cerebrovascular accident (CVA) of right thalamus ([facility]) 12/31/2020   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

## 2021-08-03 NOTE — Addendum Note (Signed)
Addended by: Arliss Journey on: 08/03/2021 08:02 AM   Modules accepted: Orders

## 2021-08-03 NOTE — Patient Instructions (Signed)
Access Code: [email or web address] URL: [email or web address] Date: 08/03/2021 Prepared by: Willow Ora  Exercises Supine Figure 4 Piriformis Stretch (Mirrored) - 1 x daily - 5 x weekly - 1 sets - 3 reps - 30 seconds hold Seated Hamstring Stretch - 1 x daily - 7 x weekly - 3 sets - 10 reps Seated Gaze Stabilization with Head Rotation and Horizontal Arm Movement - 1 x daily - 5 x weekly - 1 sets - 2-3 reps - 60 seconds hold Side Stepping with Resistance at Ankles and Counter Support - 1 x daily - 5 x weekly - 1 sets - 3 reps Tandem Walking with Counter Support - 1 x daily - 5 x weekly - 1 sets - 3 reps Standing Near Stance in Corner with Eyes Closed - 1 x daily - 5 x weekly - 1 sets - 3 reps - 30 hold Standing Balance in Corner with Eyes Closed - 1 x daily - 5 x weekly - 1 sets - 10 reps Standing Gaze Stabilization with Head Rotation - 1 x daily - 5 x weekly - 1 sets - 10 reps Standing Gaze Stabilization with Head Nod - 1 x daily - 5 x weekly - 1 sets - 10 reps

## 2021-10-05 ENCOUNTER — Encounter: Payer: Self-pay | Admitting: Physical Medicine & Rehabilitation

## 2021-11-01 ENCOUNTER — Ambulatory Visit: Payer: Medicare PPO | Admitting: Adult Health

## 2022-10-14 IMAGING — MR MR HEAD W/O CM
6 of 10 series · 27 of 48 positions shown · non-contrast
Comparison: CT head 12/18/2020
COMPARISON: CT head 12/18/2020

Addendum:
CLINICAL DATA: Dizziness.

EXAM:
MRI HEAD WITHOUT CONTRAST
TECHNIQUE: Multiplanar, multiecho pulse sequences of the brain and surrounding
structures were obtained without intravenous contrast.

[Series 2: DWI · axial · 3.0mm · 0.94mm/px · z∈[-96,+51]mm · 8 of 100 slices shown (1 of 2)]
[im 1/100]
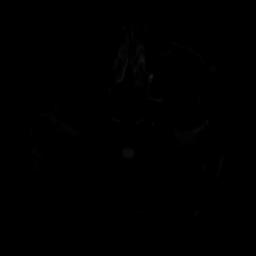
[im 12/100]
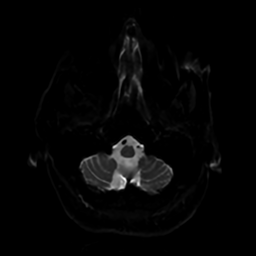
[im 34/100]
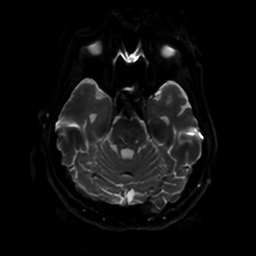
[im 45/100]
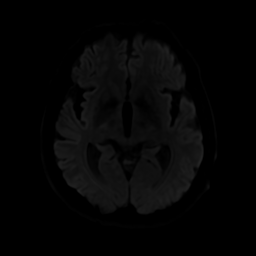
[im 56/100]
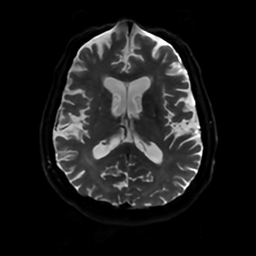
[im 67/100]
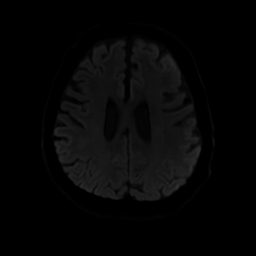
[im 89/100]
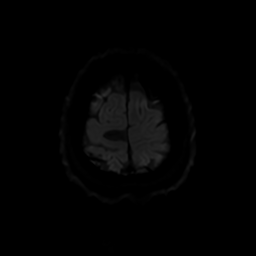
[im 100/100]
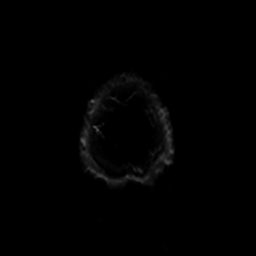

[Series 3: DWI · coronal · 4.0mm · 0.94mm/px · 7 of 74 slices shown (2 of 2)]
[im 1/74]
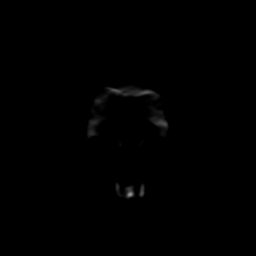
[im 13/74]
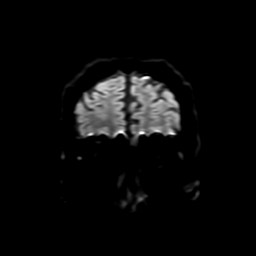
[im 25/74]
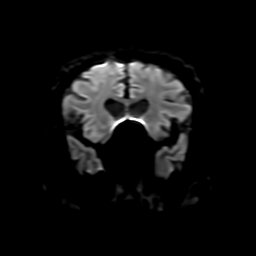
[im 37/74]
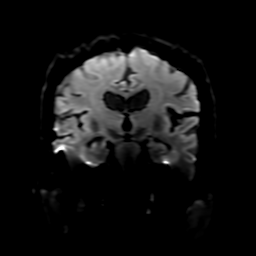
[im 49/74]
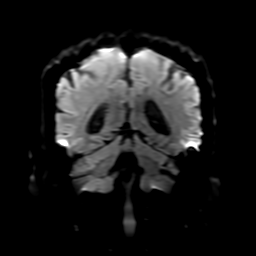
[im 61/74]
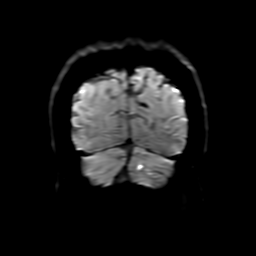
[im 74/74]
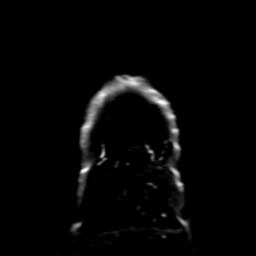

[Series 4: FLAIR · sagittal · 5.0mm · 0.23mm/px · 2 of 25 slices shown (1 of 2)]
[im 1/25]
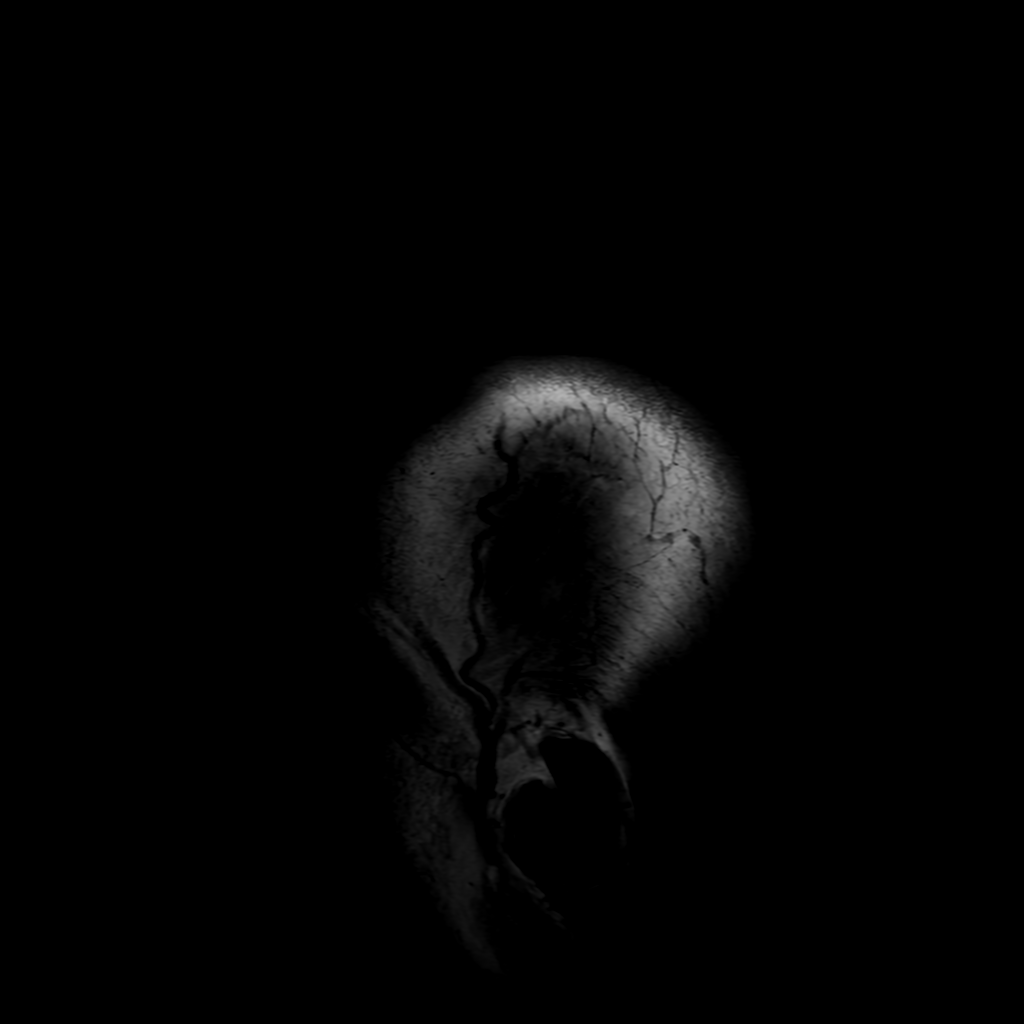
[im 25/25]
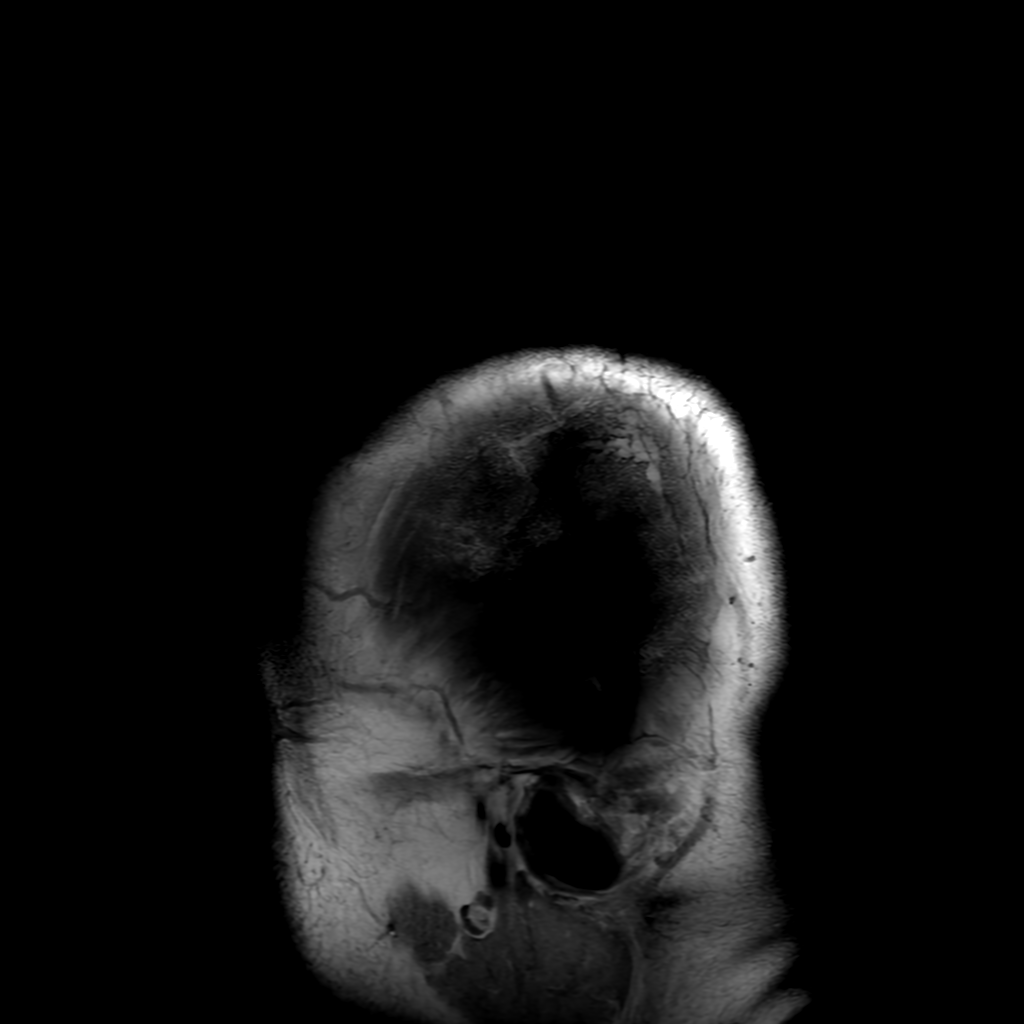

[Series 6: FLAIR · axial · 3.0mm · 0.45mm/px · z∈[-89,+49]mm · 2 of 24 slices shown (2 of 2)]
[im 1/24]
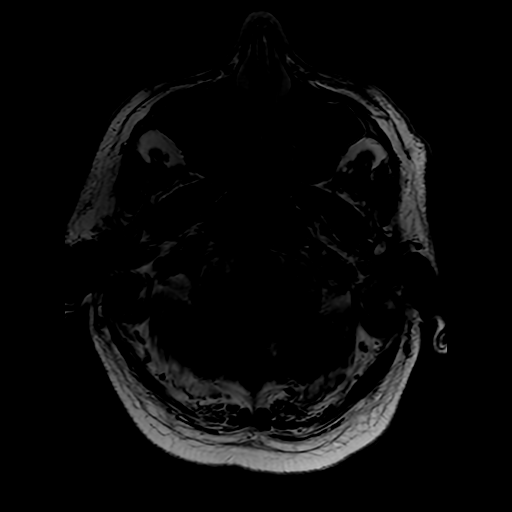
[im 24/24]
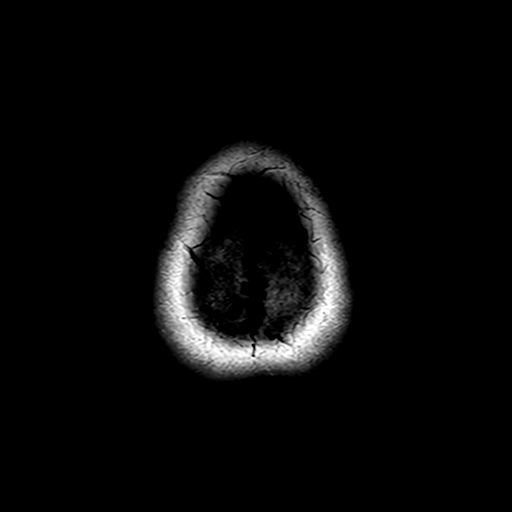

[Series 250: ADC · axial · 3.0mm · 0.94mm/px · z∈[-96,+51]mm · 5 of 50 slices shown (1 of 2)]
[im 1/50]
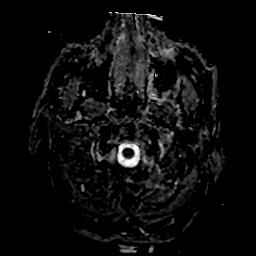
[im 13/50]
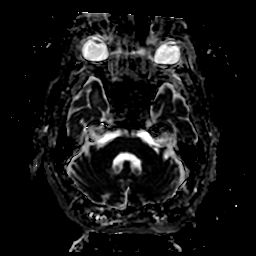
[im 25/50]
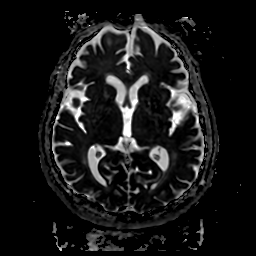
[im 37/50]
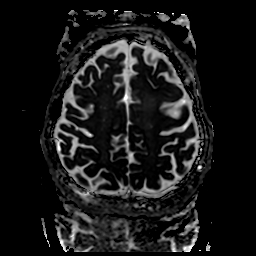
[im 50/50]
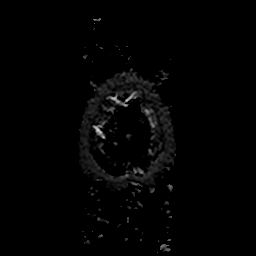

[Series 350: ADC · coronal · 4.0mm · 0.94mm/px · 3 of 36 slices shown (2 of 2)]
[im 1/36]
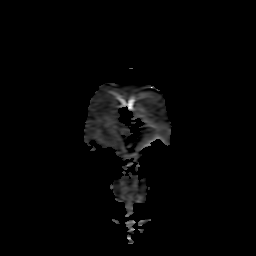
[im 18/36]
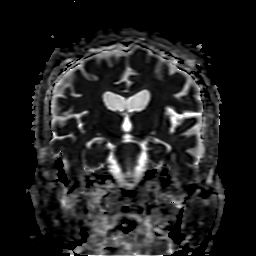
[im 36/36]
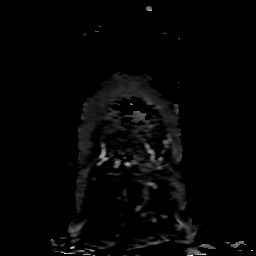

[27 of 48 positions shown; findings below may reference images not displayed]

FINDINGS: Brain: Scattered small areas of acute infarct in the left posterior
and inferior cerebellum corresponding to CT hypodensity. Additional
small infarct in the right thalamus.

Generalized atrophy. Mild white matter changes consistent with
chronic microvascular ischemia. Negative for hemorrhage or mass.

Vascular: Abnormal flow related signal distal left vertebral artery
which may be stenotic or occluded. Otherwise normal arterial flow
voids at the base of brain.

Skull and upper cervical spine: No focal abnormality.

Sinuses/Orbits: Mucosal edema paranasal sinuses.  Negative orbit

Other: None
IMPRESSION: Acute infarct left inferior and posterior cerebellum. Small acute
infarct right thalamus.

ADDENDUM:
Note is made of marked atrophy of the posterior paraspinous muscles
in the upper neck, right greater than left. This was not present on
the prior cervical MRI of 11/15/2004.

*** End of Addendum ***
FINDINGS: Brain: Scattered small areas of acute infarct in the left posterior
and inferior cerebellum corresponding to CT hypodensity. Additional
small infarct in the right thalamus.

Generalized atrophy. Mild white matter changes consistent with
chronic microvascular ischemia. Negative for hemorrhage or mass.

Vascular: Abnormal flow related signal distal left vertebral artery
which may be stenotic or occluded. Otherwise normal arterial flow
voids at the base of brain.

Skull and upper cervical spine: No focal abnormality.

Sinuses/Orbits: Mucosal edema paranasal sinuses.  Negative orbit

Other: None
IMPRESSION: Acute infarct left inferior and posterior cerebellum. Small acute
infarct right thalamus.
